# Patient Record
Sex: Male | Born: 1975 | Race: White | Hispanic: No | Marital: Single | Smoking: Never smoker
Health system: Southern US, Community
[De-identification: ages and names within clinical notes are randomized; demographics above are authoritative.]

## PROBLEM LIST (undated history)

## (undated) ENCOUNTER — Emergency Department (HOSPITAL_COMMUNITY): Discharge status: Against Medical Advice | Payer: BC Managed Care – PPO

## (undated) ENCOUNTER — Emergency Department (HOSPITAL_COMMUNITY): Admission: EM | Payer: Self-pay | Source: Home / Self Care

## (undated) DIAGNOSIS — K5000 Crohn's disease of small intestine without complications: Secondary | ICD-10-CM

## (undated) DIAGNOSIS — I10 Essential (primary) hypertension: Secondary | ICD-10-CM

## (undated) DIAGNOSIS — M109 Gout, unspecified: Secondary | ICD-10-CM

## (undated) DIAGNOSIS — K219 Gastro-esophageal reflux disease without esophagitis: Secondary | ICD-10-CM

## (undated) DIAGNOSIS — K449 Diaphragmatic hernia without obstruction or gangrene: Secondary | ICD-10-CM

## (undated) DIAGNOSIS — F1910 Other psychoactive substance abuse, uncomplicated: Secondary | ICD-10-CM

## (undated) DIAGNOSIS — I880 Nonspecific mesenteric lymphadenitis: Secondary | ICD-10-CM

## (undated) DIAGNOSIS — H548 Legal blindness, as defined in USA: Secondary | ICD-10-CM

## (undated) DIAGNOSIS — B1920 Unspecified viral hepatitis C without hepatic coma: Secondary | ICD-10-CM

## (undated) DIAGNOSIS — K260 Acute duodenal ulcer with hemorrhage: Secondary | ICD-10-CM

## (undated) DIAGNOSIS — K922 Gastrointestinal hemorrhage, unspecified: Secondary | ICD-10-CM

## (undated) HISTORY — DX: Legal blindness, as defined in USA: H54.8

## (undated) HISTORY — DX: Crohn's disease of small intestine without complications: K50.00

## (undated) HISTORY — DX: Diaphragmatic hernia without obstruction or gangrene: K44.9

## (undated) HISTORY — DX: Nonspecific mesenteric lymphadenitis: I88.0

## (undated) HISTORY — PX: LIVER BIOPSY: SHX301

## (undated) HISTORY — PX: EYE SURGERY: SHX253

## (undated) HISTORY — PX: FOOT SURGERY: SHX648

---

## 1998-08-31 ENCOUNTER — Emergency Department (HOSPITAL_COMMUNITY): Admission: EM | Admit: 1998-08-31 | Discharge: 1998-08-31 | Payer: Self-pay | Admitting: Emergency Medicine

## 1999-11-05 ENCOUNTER — Emergency Department (HOSPITAL_COMMUNITY): Admission: EM | Admit: 1999-11-05 | Discharge: 1999-11-05 | Payer: Self-pay | Admitting: Emergency Medicine

## 1999-11-05 ENCOUNTER — Encounter: Payer: Self-pay | Admitting: Emergency Medicine

## 1999-12-12 ENCOUNTER — Emergency Department (HOSPITAL_COMMUNITY): Admission: EM | Admit: 1999-12-12 | Discharge: 1999-12-12 | Payer: Self-pay | Admitting: Emergency Medicine

## 2001-01-19 ENCOUNTER — Emergency Department (HOSPITAL_COMMUNITY): Admission: EM | Admit: 2001-01-19 | Discharge: 2001-01-19 | Payer: Self-pay | Admitting: Emergency Medicine

## 2001-07-20 ENCOUNTER — Ambulatory Visit (HOSPITAL_COMMUNITY): Admission: RE | Admit: 2001-07-20 | Discharge: 2001-07-20 | Payer: Self-pay | Admitting: Gastroenterology

## 2001-07-20 ENCOUNTER — Encounter: Payer: Self-pay | Admitting: Gastroenterology

## 2001-09-01 ENCOUNTER — Encounter (INDEPENDENT_AMBULATORY_CARE_PROVIDER_SITE_OTHER): Payer: Self-pay | Admitting: Specialist

## 2001-09-01 ENCOUNTER — Ambulatory Visit (HOSPITAL_COMMUNITY): Admission: RE | Admit: 2001-09-01 | Discharge: 2001-09-01 | Payer: Self-pay | Admitting: Gastroenterology

## 2001-09-10 ENCOUNTER — Emergency Department (HOSPITAL_COMMUNITY): Admission: EM | Admit: 2001-09-10 | Discharge: 2001-09-10 | Payer: Self-pay | Admitting: Emergency Medicine

## 2001-09-13 ENCOUNTER — Emergency Department (HOSPITAL_COMMUNITY): Admission: EM | Admit: 2001-09-13 | Discharge: 2001-09-13 | Payer: Self-pay | Admitting: Emergency Medicine

## 2001-09-30 ENCOUNTER — Emergency Department (HOSPITAL_COMMUNITY): Admission: EM | Admit: 2001-09-30 | Discharge: 2001-09-30 | Payer: Self-pay

## 2001-11-08 ENCOUNTER — Encounter: Payer: Self-pay | Admitting: Emergency Medicine

## 2001-11-08 ENCOUNTER — Emergency Department (HOSPITAL_COMMUNITY): Admission: EM | Admit: 2001-11-08 | Discharge: 2001-11-08 | Payer: Self-pay | Admitting: Emergency Medicine

## 2002-01-08 ENCOUNTER — Emergency Department (HOSPITAL_COMMUNITY): Admission: EM | Admit: 2002-01-08 | Discharge: 2002-01-08 | Payer: Self-pay | Admitting: Emergency Medicine

## 2002-10-14 DIAGNOSIS — H548 Legal blindness, as defined in USA: Secondary | ICD-10-CM

## 2002-10-14 HISTORY — DX: Legal blindness, as defined in USA: H54.8

## 2002-11-03 ENCOUNTER — Emergency Department (HOSPITAL_COMMUNITY): Admission: EM | Admit: 2002-11-03 | Discharge: 2002-11-03 | Payer: Self-pay | Admitting: Emergency Medicine

## 2003-03-02 ENCOUNTER — Emergency Department (HOSPITAL_COMMUNITY): Admission: EM | Admit: 2003-03-02 | Discharge: 2003-03-02 | Payer: Self-pay | Admitting: Emergency Medicine

## 2004-11-12 ENCOUNTER — Emergency Department (HOSPITAL_COMMUNITY): Admission: EM | Admit: 2004-11-12 | Discharge: 2004-11-12 | Payer: Self-pay | Admitting: Emergency Medicine

## 2005-03-30 ENCOUNTER — Emergency Department (HOSPITAL_COMMUNITY): Admission: EM | Admit: 2005-03-30 | Discharge: 2005-03-30 | Payer: Self-pay | Admitting: Emergency Medicine

## 2006-03-22 ENCOUNTER — Emergency Department (HOSPITAL_COMMUNITY): Admission: EM | Admit: 2006-03-22 | Discharge: 2006-03-22 | Payer: Self-pay | Admitting: Emergency Medicine

## 2006-08-29 ENCOUNTER — Emergency Department (HOSPITAL_COMMUNITY): Admission: EM | Admit: 2006-08-29 | Discharge: 2006-08-29 | Payer: Self-pay | Admitting: Emergency Medicine

## 2006-11-23 ENCOUNTER — Encounter: Payer: Self-pay | Admitting: Emergency Medicine

## 2006-11-24 ENCOUNTER — Inpatient Hospital Stay (HOSPITAL_COMMUNITY): Admission: EM | Admit: 2006-11-24 | Discharge: 2006-11-29 | Payer: Self-pay | Admitting: Emergency Medicine

## 2006-11-25 ENCOUNTER — Encounter (INDEPENDENT_AMBULATORY_CARE_PROVIDER_SITE_OTHER): Payer: Self-pay | Admitting: *Deleted

## 2007-02-06 ENCOUNTER — Emergency Department (HOSPITAL_COMMUNITY): Admission: EM | Admit: 2007-02-06 | Discharge: 2007-02-06 | Payer: Self-pay | Admitting: Emergency Medicine

## 2007-06-19 ENCOUNTER — Emergency Department (HOSPITAL_COMMUNITY): Admission: EM | Admit: 2007-06-19 | Discharge: 2007-06-19 | Payer: Self-pay | Admitting: Emergency Medicine

## 2007-12-26 ENCOUNTER — Emergency Department (HOSPITAL_COMMUNITY): Admission: EM | Admit: 2007-12-26 | Discharge: 2007-12-26 | Payer: Self-pay | Admitting: Emergency Medicine

## 2008-07-25 ENCOUNTER — Emergency Department (HOSPITAL_COMMUNITY): Admission: EM | Admit: 2008-07-25 | Discharge: 2008-07-25 | Payer: Self-pay | Admitting: Emergency Medicine

## 2008-08-24 ENCOUNTER — Emergency Department (HOSPITAL_COMMUNITY): Admission: EM | Admit: 2008-08-24 | Discharge: 2008-08-24 | Payer: Self-pay | Admitting: Emergency Medicine

## 2009-02-16 ENCOUNTER — Emergency Department (HOSPITAL_COMMUNITY): Admission: EM | Admit: 2009-02-16 | Discharge: 2009-02-16 | Payer: Self-pay | Admitting: Emergency Medicine

## 2009-05-26 ENCOUNTER — Emergency Department (HOSPITAL_COMMUNITY): Admission: EM | Admit: 2009-05-26 | Discharge: 2009-05-26 | Payer: Self-pay | Admitting: Emergency Medicine
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
## 2011-03-01 NOTE — Op Note (Signed)
NAMEDEADRIAN, TOYA NO.:  [id number]   MEDICAL RECORD NO.:  [id number]          PATIENT TYPE:  INP   LOCATION:  5731                         FACILITY:  [facility]   PHYSICIAN:  Chalmers Guest, M.D.     DATE OF BIRTH:  [birth year]   DATE OF PROCEDURE:  11/24/2006  DATE OF DISCHARGE:                               OPERATIVE REPORT   PREOPERATIVE DIAGNOSIS:  1. Ocular hypotony, right eye.  2. Possible ruptured globe, right eye.  3. Orbital fracture, right eye.   POSTOPERATIVE DIAGNOSIS:  1. Ocular hypotony.  2. Orbital fracture.  3. Dehiscence of previous glaucoma surgery.   ANESTHESIA:  General.   PROCEDURE:  The patient was taken to the operating room where, following  induction of general anesthesia, the patient's face was prepped and  draped, taking care not to apply any pressure to the eye.  Following  this a speculum was gently inserted and with the tissues parted it was  noted that the patient had superior chemosis and subconjunctival  hemorrhage.  A 6-0 nylon suture was passed through clear cornea at the 6  o'clock position to infraduct the eye.   Following this a blunt Westcott scissors and Bishop-Harman forceps were  used to perform a fornix-based conjunctival flap.  It was noted that the  superior conjunctiva had an area at the 1 o'clock position of necrotic  conjunctiva.  Dissection was carefully carried forward.  There was  significant subconjunctival bleeding that was controlled with cautery.  The conjunctiva was carefully laid posterior.  A measurement was carried  out 5 mm to the corneoscleral wound where it was noted that there was a  surgical defect in the sclera with previously placed sutures.  After a  superior 180-degree peritomy was performed and cautery was applied, the  surface of the globe was inspected.  There was noted to be no foreign  body.  It was noted that there was an area of scarred tenon's tissue and  a site of a previous  trabeculectomy was identified that was now opened  with prolapse of uveal tissue.  A Weck-cel sponge was applied.  The  tissue was excised and sent to pathology.  This was the area of previous  sclerostomy with vitreous herniation.  A Super Sharp blade was used  through clear cornea at the 7 o'clock position and BSS was used to  inflate the anterior chamber.   Following this additional BSS was applied and then it was noted that  with the vitreous incarcerated in the wound it was necessary to place a  chamber maintainer attached to balanced salt solution and an anterior  vitrectomy was performed.  The patient's lens appeared to be opacified  at the beginning of the procedure with a fine film of blood overlying  the lens.  As the anterior vitrectomy was performed through the scleral  defect at the 1 o'clock position, care was taken not to touch the lens  tissue.  After vitreous had been removed from the wound site and there  was no prolapse of tissue, it was possible to see that  there was a  superior sector iridectomy.  All iris and uvula tissue were removed from  the wound and then it was noted that there was an approximately 1 x 3  scleral defect.  A 4-0 Mersilene suture on a tapered needle was then  passed in a mattress fashion through this tissue.  The suture was  secured, pulling the eye closed and tying it tightly.  Fluoroscein was  applied.  No leakage was noted.  The anterior chamber was again deepened  with balanced salt solution and a small amount of Provisc.   Following this a Tutoplast patch graft was then secured to the sclera  over the area of the defect using three interrupted 9-0 nylon sutures.  Following this the conjunctiva was then carefully re-sutured to the  corneoscleral limbus using a 7-0 Vicryl suture.  After this area had  been closed, BSS was again injected in to inflate the chamber.  Topical  fluorescein was applied and it was noted that there was leakage  through  the clear cornea paracentesis site.  Therefore a single 10-0 nylon  suture was passed through the paracentesis to achieve watertight  closure.  The anterior chamber was again inflated by passing it just  beside the previously placed suture.  The chamber remained deep and  there was no fluorescein drips noted.  Therefore sterile conjunctival  injection of gentamicin was given inferiorly.  Topical Tobradex ointment  was applied.  A patch and Fox shield were placed and the patient  returned to recovery area in stable condition.      Chalmers Guest, M.D.  Electronically Signed     RW/MEDQ  D:  11/25/2006  T:  11/25/2006  Job:  [id number]
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
## 2011-03-23 ENCOUNTER — Emergency Department (HOSPITAL_COMMUNITY): Payer: Self-pay

## 2011-03-23 ENCOUNTER — Emergency Department (HOSPITAL_COMMUNITY)
Admission: EM | Admit: 2011-03-23 | Discharge: 2011-03-23 | Disposition: A | Discharge status: Home / Self Care | Payer: Self-pay | Attending: Emergency Medicine | Admitting: Emergency Medicine

## 2011-03-23 ENCOUNTER — Emergency Department (HOSPITAL_COMMUNITY)
Admission: EM | Admit: 2011-03-23 | Discharge: 2011-03-23 | Discharge status: Against Medical Advice | Payer: Self-pay | Attending: Emergency Medicine | Admitting: Emergency Medicine

## 2011-03-23 DIAGNOSIS — R0602 Shortness of breath: Secondary | ICD-10-CM | POA: Insufficient documentation

## 2011-03-23 DIAGNOSIS — IMO0001 Reserved for inherently not codable concepts without codable children: Secondary | ICD-10-CM | POA: Insufficient documentation

## 2011-03-23 DIAGNOSIS — Z8619 Personal history of other infectious and parasitic diseases: Secondary | ICD-10-CM | POA: Insufficient documentation

## 2011-03-23 DIAGNOSIS — R000 Tachycardia, unspecified: Secondary | ICD-10-CM | POA: Insufficient documentation

## 2011-03-23 DIAGNOSIS — R748 Abnormal levels of other serum enzymes: Secondary | ICD-10-CM | POA: Insufficient documentation

## 2011-03-23 LAB — RAPID URINE DRUG SCREEN, HOSP PERFORMED
Opiates: NOT DETECTED
Tetrahydrocannabinol: NOT DETECTED

## 2011-03-23 LAB — DIFFERENTIAL
Basophils Absolute: 0 10*3/uL (ref 0.0–0.1)
Basophils Relative: 0 % (ref 0–1)
Eosinophils Absolute: 0.1 10*3/uL (ref 0.0–0.7)
Eosinophils Relative: 1 % (ref 0–5)
Lymphocytes Relative: 10 % — ABNORMAL LOW (ref 12–46)
Lymphs Abs: 1.1 10*3/uL (ref 0.7–4.0)
Monocytes Absolute: 0.9 10*3/uL (ref 0.1–1.0)
Monocytes Relative: 8 % (ref 3–12)
Neutro Abs: 9.7 10*3/uL — ABNORMAL HIGH (ref 1.7–7.7)
Neutrophils Relative %: 82 % — ABNORMAL HIGH (ref 43–77)

## 2011-03-23 LAB — URINALYSIS, ROUTINE W REFLEX MICROSCOPIC
Glucose, UA: NEGATIVE mg/dL
Hgb urine dipstick: NEGATIVE
Leukocytes, UA: NEGATIVE
Specific Gravity, Urine: 1.023 (ref 1.005–1.030)
Urobilinogen, UA: 0.2 mg/dL (ref 0.0–1.0)

## 2011-03-23 LAB — HEPATIC FUNCTION PANEL
ALT: 229 U/L — ABNORMAL HIGH (ref 0–53)
AST: 183 U/L — ABNORMAL HIGH (ref 0–37)
Albumin: 4.4 g/dL (ref 3.5–5.2)
Alkaline Phosphatase: 53 U/L (ref 39–117)
Bilirubin, Direct: 0.1 mg/dL (ref 0.0–0.3)
Indirect Bilirubin: 0.3 mg/dL (ref 0.3–0.9)
Total Bilirubin: 0.4 mg/dL (ref 0.3–1.2)
Total Protein: 8.5 g/dL — ABNORMAL HIGH (ref 6.0–8.3)

## 2011-03-23 LAB — CK
Total CK: 458 U/L — ABNORMAL HIGH (ref 7–232)
Total CK: 202 U/L (ref 7–232)

## 2011-03-23 LAB — CBC
HCT: 40.6 % (ref 39.0–52.0)
Hemoglobin: 13.8 g/dL (ref 13.0–17.0)
MCH: 31.7 pg (ref 26.0–34.0)
MCHC: 34 g/dL (ref 30.0–36.0)
MCV: 93.3 fL (ref 78.0–100.0)
Platelets: 234 10*3/uL (ref 150–400)
RBC: 4.35 MIL/uL (ref 4.22–5.81)
RDW: 13.9 % (ref 11.5–15.5)
WBC: 11.9 10*3/uL — ABNORMAL HIGH (ref 4.0–10.5)

## 2011-03-23 LAB — TROPONIN I: Troponin I: <0.3 ng/mL (ref <0.30)

## 2011-03-24 LAB — COMPREHENSIVE METABOLIC PANEL
BUN: 11 mg/dL (ref 6–23)
CO2: 26 mEq/L (ref 19–32)
Chloride: 101 mEq/L (ref 96–112)
Creatinine, Ser: 1 mg/dL (ref 0.4–1.5)
GFR calc non Af Amer: >60 mL/min (ref >60)
Total Bilirubin: 0.6 mg/dL (ref 0.3–1.2)

## 2011-03-25 LAB — URINE CULTURE: Culture: NO GROWTH

## 2011-06-04 ENCOUNTER — Emergency Department (HOSPITAL_COMMUNITY): Payer: Self-pay

## 2011-06-04 ENCOUNTER — Emergency Department (HOSPITAL_COMMUNITY)
Admission: EM | Admit: 2011-06-04 | Discharge: 2011-06-05 | Disposition: A | Discharge status: Home / Self Care | Payer: Self-pay

## 2011-06-04 DIAGNOSIS — R4182 Altered mental status, unspecified: Secondary | ICD-10-CM | POA: Insufficient documentation

## 2011-06-04 DIAGNOSIS — R350 Frequency of micturition: Secondary | ICD-10-CM | POA: Insufficient documentation

## 2011-06-04 DIAGNOSIS — S1093XA Contusion of unspecified part of neck, initial encounter: Secondary | ICD-10-CM | POA: Insufficient documentation

## 2011-06-04 DIAGNOSIS — S1190XA Unspecified open wound of unspecified part of neck, initial encounter: Secondary | ICD-10-CM | POA: Insufficient documentation

## 2011-06-04 DIAGNOSIS — F1010 Alcohol abuse, uncomplicated: Secondary | ICD-10-CM | POA: Insufficient documentation

## 2011-06-04 DIAGNOSIS — F411 Generalized anxiety disorder: Secondary | ICD-10-CM | POA: Insufficient documentation

## 2011-06-04 DIAGNOSIS — S0003XA Contusion of scalp, initial encounter: Secondary | ICD-10-CM | POA: Insufficient documentation

## 2011-06-04 DIAGNOSIS — R112 Nausea with vomiting, unspecified: Secondary | ICD-10-CM | POA: Insufficient documentation

## 2011-06-04 DIAGNOSIS — IMO0002 Reserved for concepts with insufficient information to code with codable children: Secondary | ICD-10-CM | POA: Insufficient documentation

## 2011-06-04 LAB — TYPE AND SCREEN

## 2011-06-04 MED ORDER — IOHEXOL 350 MG/ML SOLN
100.0000 mL | Freq: Once | INTRAVENOUS | Status: AC | PRN
  Administered 2011-06-04: 100 mL via INTRAVENOUS

## 2011-06-05 ENCOUNTER — Emergency Department (HOSPITAL_COMMUNITY)
Admission: EM | Admit: 2011-06-05 | Discharge: 2011-06-05 | Disposition: A | Discharge status: Home / Self Care | Payer: Self-pay | Attending: Emergency Medicine | Admitting: Emergency Medicine

## 2011-06-05 ENCOUNTER — Emergency Department (HOSPITAL_COMMUNITY)
Admission: EM | Admit: 2011-06-05 | Discharge: 2011-06-06 | Disposition: A | Discharge status: Home / Self Care | Payer: Self-pay | Attending: Emergency Medicine | Admitting: Emergency Medicine

## 2011-06-05 ENCOUNTER — Emergency Department (HOSPITAL_COMMUNITY): Payer: Self-pay

## 2011-06-05 DIAGNOSIS — H179 Unspecified corneal scar and opacity: Secondary | ICD-10-CM | POA: Insufficient documentation

## 2011-06-05 DIAGNOSIS — Z8639 Personal history of other endocrine, nutritional and metabolic disease: Secondary | ICD-10-CM | POA: Insufficient documentation

## 2011-06-05 DIAGNOSIS — R112 Nausea with vomiting, unspecified: Secondary | ICD-10-CM | POA: Insufficient documentation

## 2011-06-05 DIAGNOSIS — S1190XA Unspecified open wound of unspecified part of neck, initial encounter: Secondary | ICD-10-CM | POA: Insufficient documentation

## 2011-06-05 DIAGNOSIS — R0602 Shortness of breath: Secondary | ICD-10-CM | POA: Insufficient documentation

## 2011-06-05 DIAGNOSIS — J189 Pneumonia, unspecified organism: Secondary | ICD-10-CM | POA: Insufficient documentation

## 2011-06-05 DIAGNOSIS — Z862 Personal history of diseases of the blood and blood-forming organs and certain disorders involving the immune mechanism: Secondary | ICD-10-CM | POA: Insufficient documentation

## 2011-06-05 DIAGNOSIS — Z87828 Personal history of other (healed) physical injury and trauma: Secondary | ICD-10-CM | POA: Insufficient documentation

## 2011-06-05 DIAGNOSIS — R002 Palpitations: Secondary | ICD-10-CM | POA: Insufficient documentation

## 2011-06-05 DIAGNOSIS — M542 Cervicalgia: Secondary | ICD-10-CM | POA: Insufficient documentation

## 2011-06-05 DIAGNOSIS — E860 Dehydration: Secondary | ICD-10-CM | POA: Insufficient documentation

## 2011-06-05 DIAGNOSIS — X58XXXA Exposure to other specified factors, initial encounter: Secondary | ICD-10-CM | POA: Insufficient documentation

## 2011-06-05 DIAGNOSIS — IMO0002 Reserved for concepts with insufficient information to code with codable children: Secondary | ICD-10-CM | POA: Insufficient documentation

## 2011-06-05 DIAGNOSIS — F411 Generalized anxiety disorder: Secondary | ICD-10-CM | POA: Insufficient documentation

## 2011-06-05 LAB — CBC
HCT: 42.1 % (ref 39.0–52.0)
HCT: 42.8 % (ref 39.0–52.0)
Hemoglobin: 14.4 g/dL (ref 13.0–17.0)
Hemoglobin: 14.5 g/dL (ref 13.0–17.0)
MCH: 31.3 pg (ref 26.0–34.0)
MCV: 92.4 fL (ref 78.0–100.0)
RBC: 4.52 MIL/uL (ref 4.22–5.81)
RBC: 4.63 MIL/uL (ref 4.22–5.81)
WBC: 10.1 10*3/uL (ref 4.0–10.5)
WBC: 10.8 10*3/uL — ABNORMAL HIGH (ref 4.0–10.5)

## 2011-06-05 LAB — BASIC METABOLIC PANEL
BUN: 9 mg/dL (ref 6–23)
CO2: 26 mEq/L (ref 19–32)
Chloride: 107 mEq/L (ref 96–112)
Glucose, Bld: 107 mg/dL — ABNORMAL HIGH (ref 70–99)
Potassium: 3.9 mEq/L (ref 3.5–5.1)

## 2011-06-05 LAB — DIFFERENTIAL
Basophils Absolute: 0 10*3/uL (ref 0.0–0.1)
Eosinophils Absolute: 0.1 10*3/uL (ref 0.0–0.7)
Lymphocytes Relative: 18 % (ref 12–46)
Lymphocytes Relative: 17 % (ref 12–46)
Lymphs Abs: 1.8 10*3/uL (ref 0.7–4.0)
Monocytes Absolute: 0.6 10*3/uL (ref 0.1–1.0)
Monocytes Relative: 7 % (ref 3–12)
Neutro Abs: 7.7 10*3/uL (ref 1.7–7.7)
Neutro Abs: 8.1 10*3/uL — ABNORMAL HIGH (ref 1.7–7.7)
Neutrophils Relative %: 75 % (ref 43–77)

## 2011-06-05 LAB — COMPREHENSIVE METABOLIC PANEL
BUN: 13 mg/dL (ref 6–23)
CO2: 26 mEq/L (ref 19–32)
Calcium: 10 mg/dL (ref 8.4–10.5)
Chloride: 101 mEq/L (ref 96–112)
Creatinine, Ser: 1.08 mg/dL (ref 0.50–1.35)
GFR calc Af Amer: >60 mL/min (ref >60)
GFR calc non Af Amer: >60 mL/min (ref >60)
Glucose, Bld: 111 mg/dL — ABNORMAL HIGH (ref 70–99)
Total Bilirubin: 0.9 mg/dL (ref 0.3–1.2)

## 2011-06-10 ENCOUNTER — Telehealth (INDEPENDENT_AMBULATORY_CARE_PROVIDER_SITE_OTHER): Payer: Self-pay | Admitting: Orthopedic Surgery

## 2011-06-10 MED ORDER — HYDROCODONE-ACETAMINOPHEN 5-325 MG PO TABS: 1.0000 | ORAL_TABLET | ORAL | Status: AC | PRN

## 2011-06-10 NOTE — Telephone Encounter (Signed)
Scheduled appt on Thursday for suture removal and called in Norco to replace Percocet that is nearly out.
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
## 2011-06-13 ENCOUNTER — Encounter (INDEPENDENT_AMBULATORY_CARE_PROVIDER_SITE_OTHER): Payer: Self-pay | Admitting: Physician Assistant

## 2011-06-13 ENCOUNTER — Ambulatory Visit (INDEPENDENT_AMBULATORY_CARE_PROVIDER_SITE_OTHER): Payer: Self-pay | Admitting: Physician Assistant

## 2011-06-13 VITALS — BP 140/100 | HR 80 | Temp 98.2°F | Ht 70.5 in | Wt 212.4 lb

## 2011-06-13 DIAGNOSIS — S1191XA Laceration without foreign body of unspecified part of neck, initial encounter: Secondary | ICD-10-CM

## 2011-06-13 DIAGNOSIS — S1190XA Unspecified open wound of unspecified part of neck, initial encounter: Secondary | ICD-10-CM

## 2011-06-13 NOTE — Patient Instructions (Signed)
You may start using Vitamin E Cream on wound in about 1 more week. Keep sunscreen on wound to help lessen the appearance of the scar. Follow up with Trauma Service as needed
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
## 2011-07-08 LAB — COMPREHENSIVE METABOLIC PANEL
ALT: 171 — ABNORMAL HIGH
AST: 79 — ABNORMAL HIGH
Albumin: 4
CO2: 28
Calcium: 9.3
Chloride: 105
Creatinine, Ser: 1.06
GFR calc Af Amer: >60
GFR calc non Af Amer: >60
Sodium: 141
Total Bilirubin: 0.5

## 2011-07-08 LAB — DIFFERENTIAL
Eosinophils Absolute: 0.1
Eosinophils Relative: 1
Lymphocytes Relative: 41
Lymphs Abs: 2.9
Monocytes Absolute: 0.4

## 2011-07-08 LAB — CBC
MCV: 92.3
Platelets: 261
RBC: 4.28
WBC: 7.1

## 2011-07-08 LAB — PROTIME-INR: Prothrombin Time: 12.6

## 2011-07-08 LAB — APTT: aPTT: 25

## 2011-07-16 LAB — COMPREHENSIVE METABOLIC PANEL
ALT: 67 — ABNORMAL HIGH
AST: 29
Albumin: 3.8
Alkaline Phosphatase: 41
GFR calc Af Amer: >60
Glucose, Bld: 105 — ABNORMAL HIGH
Potassium: 3.7
Sodium: 138
Total Protein: 7.1

## 2011-07-16 LAB — DIFFERENTIAL
Basophils Relative: 1
Eosinophils Absolute: 0
Eosinophils Relative: 1
Monocytes Absolute: 0.4
Monocytes Relative: 7

## 2011-07-16 LAB — CBC
Hemoglobin: 13.9
RDW: 14.1

## 2012-12-04 ENCOUNTER — Encounter (HOSPITAL_COMMUNITY): Payer: Self-pay | Admitting: Emergency Medicine

## 2012-12-04 ENCOUNTER — Emergency Department (HOSPITAL_COMMUNITY): Payer: Self-pay

## 2012-12-04 ENCOUNTER — Emergency Department (HOSPITAL_COMMUNITY)
Admission: EM | Admit: 2012-12-04 | Discharge: 2012-12-05 | Disposition: A | Discharge status: Home / Self Care | Payer: Self-pay | Attending: Emergency Medicine | Admitting: Emergency Medicine

## 2012-12-04 DIAGNOSIS — R509 Fever, unspecified: Secondary | ICD-10-CM | POA: Insufficient documentation

## 2012-12-04 DIAGNOSIS — IMO0001 Reserved for inherently not codable concepts without codable children: Secondary | ICD-10-CM | POA: Insufficient documentation

## 2012-12-04 DIAGNOSIS — B1920 Unspecified viral hepatitis C without hepatic coma: Secondary | ICD-10-CM | POA: Insufficient documentation

## 2012-12-04 DIAGNOSIS — I10 Essential (primary) hypertension: Secondary | ICD-10-CM | POA: Insufficient documentation

## 2012-12-04 DIAGNOSIS — A084 Viral intestinal infection, unspecified: Secondary | ICD-10-CM

## 2012-12-04 DIAGNOSIS — Z79899 Other long term (current) drug therapy: Secondary | ICD-10-CM | POA: Insufficient documentation

## 2012-12-04 DIAGNOSIS — R079 Chest pain, unspecified: Secondary | ICD-10-CM | POA: Insufficient documentation

## 2012-12-04 DIAGNOSIS — M549 Dorsalgia, unspecified: Secondary | ICD-10-CM | POA: Insufficient documentation

## 2012-12-04 DIAGNOSIS — A088 Other specified intestinal infections: Secondary | ICD-10-CM | POA: Insufficient documentation

## 2012-12-04 DIAGNOSIS — R197 Diarrhea, unspecified: Secondary | ICD-10-CM | POA: Insufficient documentation

## 2012-12-04 DIAGNOSIS — Z8619 Personal history of other infectious and parasitic diseases: Secondary | ICD-10-CM | POA: Insufficient documentation

## 2012-12-04 HISTORY — DX: Unspecified viral hepatitis C without hepatic coma: B19.20

## 2012-12-04 HISTORY — DX: Essential (primary) hypertension: I10

## 2012-12-04 LAB — CBC WITH DIFFERENTIAL/PLATELET
Basophils Relative: 0 % (ref 0–1)
Eosinophils Absolute: 0.1 10*3/uL (ref 0.0–0.7)
Eosinophils Relative: 1 % (ref 0–5)
MCH: 31.4 pg (ref 26.0–34.0)
MCHC: 34.6 g/dL (ref 30.0–36.0)
MCV: 91 fL (ref 78.0–100.0)
Neutrophils Relative %: 71 % (ref 43–77)
Platelets: 198 10*3/uL (ref 150–400)

## 2012-12-04 LAB — COMPREHENSIVE METABOLIC PANEL
ALT: 96 U/L — ABNORMAL HIGH (ref 0–53)
Albumin: 3.7 g/dL (ref 3.5–5.2)
Alkaline Phosphatase: 49 U/L (ref 39–117)
BUN: 13 mg/dL (ref 6–23)
Calcium: 9.4 mg/dL (ref 8.4–10.5)
GFR calc Af Amer: >90 mL/min (ref >90)
Potassium: 3.5 mEq/L (ref 3.5–5.1)
Sodium: 144 mEq/L (ref 135–145)
Total Protein: 7.7 g/dL (ref 6.0–8.3)

## 2012-12-04 LAB — POCT I-STAT TROPONIN I: Troponin i, poc: 0 ng/mL (ref 0.00–0.08)

## 2012-12-04 MED ORDER — PROMETHAZINE HCL 25 MG PO TABS: 25.0000 mg | ORAL_TABLET | Freq: Four times a day (QID) | ORAL | Status: DC | PRN

## 2012-12-04 MED ORDER — PROMETHAZINE HCL 25 MG/ML IJ SOLN
25.0000 mg | Freq: Once | INTRAMUSCULAR | Status: AC
  Administered 2012-12-04: 25 mg via INTRAVENOUS
  Filled 2012-12-04: qty 1

## 2012-12-04 MED ORDER — SODIUM CHLORIDE 0.9 % IV SOLN
Freq: Once | INTRAVENOUS | Status: AC
  Administered 2012-12-04: 22:00:00 via INTRAVENOUS

## 2012-12-04 NOTE — ED Notes (Signed)
Pt c/o left sided chest pain/stabbing. Pt reports he was eating dinner when all of a sudden he started getting chest pain along with chills and nausea/dizzy/bilateral hand tingling then vomited X 1 then came to the ED. Pt reports yesterday he was nauseous then vomited and had diarrhea. Then went to work and continued to have diarrhea and vomited X 3 at work. Pt in nad, resp e/u, skin warm and dry. Pt sts he hasn't been taking his BP medication because he ran out and doesn't have insurance at the moment because he just started a new job. Pt goes to Dr. Kristopher Oppenheim.

## 2012-12-04 NOTE — ED Notes (Signed)
Patient reports that while eating in a restaurant tonight, he got a sudden onset of chest pain, shortness of breath, nausea, vomiting, and chills.  Patient states that he feels that his heart is skipping a beat.  Reports HTN; states that his dad passed away from an MI at 81.

## 2012-12-04 NOTE — ED Notes (Signed)
Reports past history of cocaine use; denies cocaine use in the last two months.

## 2012-12-04 NOTE — ED Notes (Signed)
Pt also c/o neck and back pain since yesterday. Pt sts he has been taking ibuprofen for it and then it goes away, he thinks it may be from work.

## 2012-12-04 NOTE — ED Provider Notes (Signed)
History     CSN: [id number]  Arrival date & time 12/04/12  2059   First MD Initiated Contact with Patient 12/04/12 2220      Chief Complaint  Patient presents with  . Chest Pain  . Emesis    (Consider location/radiation/quality/duration/timing/severity/associated sxs/prior treatment) HPI Comments: Pt is a 37 yo man who had had nausea, vomiting and diarrhea yesterday.  He was up at 3 AM, felt shaky, and had vomiting then. He took some Tylenol.  He went to work without problems, took a nap, and then he and his wife went out to dinner at [employer].  Afte the meal he was shaky, was nauseated, and had a pain in the chest. He therefore sought evaluation.   Patient is a 37 y.o. male presenting with vomiting.  Emesis Severity:  Severe Duration:  1 day Timing:  Intermittent Quality:  Stomach contents Progression:  Worsening Chronicity:  New Recent urination:  Normal Worsened by:  Nothing tried Ineffective treatments:  None tried Associated symptoms: chills, diarrhea, fever and myalgias   Diarrhea:    Quality:  Watery   Severity:  Moderate   Duration:  1 day   Timing:  Intermittent Fever:    Temp source:  Subjective   Progression:  Waxing and waning   Past Medical History  Diagnosis Date  . Hypertension   . Hepatitis C     Past Surgical History  Procedure Laterality Date  . Eye surgery    . Foot surgery      History reviewed. No pertinent family history.  History  Substance Use Topics  . Smoking status: Never Smoker   . Smokeless tobacco: Not on file  . Alcohol Use: Yes      Review of Systems  Constitutional: Positive for fever and chills.  HENT: Negative.   Eyes: Negative.   Respiratory: Negative.   Cardiovascular: Positive for chest pain.  Gastrointestinal: Positive for nausea, vomiting and diarrhea.  Genitourinary: Negative.   Musculoskeletal: Positive for myalgias and back pain.  Skin: Negative.   Neurological: Negative.    Psychiatric/Behavioral: Negative.     Allergies  Review of patient's allergies indicates no known allergies.  Home Medications   Current Outpatient Rx  Name  Route  Sig  Dispense  Refill  . lisinopril (PRINIVIL,ZESTRIL) 10 MG tablet   Oral   Take 10 mg by mouth 2 (two) times daily.         . metoprolol tartrate (LOPRESSOR) 25 MG tablet   Oral   Take 25 mg by mouth 2 (two) times daily.           BP 174/97  Pulse 103  Temp(Src) 98.1 F (36.7 C) (Oral)  Resp 13  SpO2 99%  Physical Exam  Nursing note and vitals reviewed. Constitutional: He is oriented to person, place, and time. He appears well-developed and well-nourished.  Appears anxious, no distress at rest.  HENT:  Head: Normocephalic and atraumatic.  Right Ear: External ear normal.  Left Ear: External ear normal.  Mouth/Throat: Oropharynx is clear and moist.  Eyes: Conjunctivae and EOM are normal. Pupils are equal, round, and reactive to light.  Neck: Normal range of motion. Neck supple.  Cardiovascular: Normal rate, regular rhythm and normal heart sounds.   Pulmonary/Chest: Effort normal and breath sounds normal.  Abdominal: Soft. Bowel sounds are normal.  Musculoskeletal: Normal range of motion. He exhibits no edema and no tenderness.  Neurological: He is alert and oriented to person, place, and time.  No  sensory or motor deficit.  Skin: Skin is warm and dry.  Psychiatric: He has a normal mood and affect. His behavior is normal.    ED Course  Procedures (including critical care time)     Date: 12/04/2012  Rate: 112  Rhythm: sinus tachycardia  QRS Axis: normal  Intervals: normal  ST/T Wave abnormalities: normal  Conduction Disutrbances:  Incomplete right bundle branch block.  Narrative Interpretation: Borderline EKG  Old EKG Reviewed: none available  Results for orders placed during the hospital encounter of 12/04/12  CBC WITH DIFFERENTIAL      Result Value Range   WBC 10.2  4.0 - 10.5 K/uL    RBC 4.42  4.22 - 5.81 MIL/uL   Hemoglobin 13.9  13.0 - 17.0 g/dL   HCT 16.1  09.6 - 04.5 %   MCV 91.0  78.0 - 100.0 fL   MCH 31.4  26.0 - 34.0 pg   MCHC 34.6  30.0 - 36.0 g/dL   RDW 40.9  81.1 - 91.4 %   Platelets 198  150 - 400 K/uL   Neutrophils Relative 71  43 - 77 %   Neutro Abs 7.2  1.7 - 7.7 K/uL   Lymphocytes Relative 22  12 - 46 %   Lymphs Abs 2.2  0.7 - 4.0 K/uL   Monocytes Relative 6  3 - 12 %   Monocytes Absolute 0.7  0.1 - 1.0 K/uL   Eosinophils Relative 1  0 - 5 %   Eosinophils Absolute 0.1  0.0 - 0.7 K/uL   Basophils Relative 0  0 - 1 %   Basophils Absolute 0.0  0.0 - 0.1 K/uL  COMPREHENSIVE METABOLIC PANEL      Result Value Range   Sodium 144  135 - 145 mEq/L   Potassium 3.5  3.5 - 5.1 mEq/L   Chloride 103  96 - 112 mEq/L   CO2 29  19 - 32 mEq/L   Glucose, Bld 111 (*) 70 - 99 mg/dL   BUN 13  6 - 23 mg/dL   Creatinine, Ser 7.82  0.50 - 1.35 mg/dL   Calcium 9.4  8.4 - 95.6 mg/dL   Total Protein 7.7  6.0 - 8.3 g/dL   Albumin 3.7  3.5 - 5.2 g/dL   AST 66 (*) 0 - 37 U/L   ALT 96 (*) 0 - 53 U/L   Alkaline Phosphatase 49  39 - 117 U/L   Total Bilirubin 0.3  0.3 - 1.2 mg/dL   GFR calc non Af Amer >90  >90 mL/min   GFR calc Af Amer >90  >90 mL/min  POCT I-STAT TROPONIN I      Result Value Range   Troponin i, poc 0.00  0.00 - 0.08 ng/mL   Comment 3            Dg Chest 2 View  12/04/2012  *RADIOLOGY REPORT*  Clinical Data: Chest pain and emesis.  Hyperventilating.  CHEST - 2 VIEW  Comparison: 06/05/2011  Findings: The heart size and pulmonary vascularity are normal. The lungs appear clear and expanded without focal air space disease or consolidation. No blunting of the costophrenic angles.  No pneumothorax.  Mediastinal contours appear intact.  Infiltrates seen previously in the right lower lung have resolved.  IMPRESSION: No evidence of active pulmonary disease.   Original Report Authenticated By: Burman Nieves, M.D.     Course in ED:  Pt was seen and had  physical examination.  He was treated with  IV fluids and IV Phenergan.  His lab workup was negative.  He was advised that he had viral gastroenteritis and was treated symptomatically with clear liquids and po Phenergan.  Released.   1. Viral gastroenteritis         Carleene Cooper III, MD 12/05/12 1229

## 2013-04-05 ENCOUNTER — Emergency Department (HOSPITAL_COMMUNITY)
Admission: EM | Admit: 2013-04-05 | Discharge: 2013-04-05 | Disposition: A | Discharge status: Home / Self Care | Payer: BC Managed Care – PPO | Attending: Emergency Medicine | Admitting: Emergency Medicine

## 2013-04-05 ENCOUNTER — Encounter (HOSPITAL_COMMUNITY): Payer: Self-pay | Admitting: *Deleted

## 2013-04-05 DIAGNOSIS — M109 Gout, unspecified: Secondary | ICD-10-CM

## 2013-04-05 DIAGNOSIS — Z79899 Other long term (current) drug therapy: Secondary | ICD-10-CM | POA: Insufficient documentation

## 2013-04-05 DIAGNOSIS — Z8619 Personal history of other infectious and parasitic diseases: Secondary | ICD-10-CM | POA: Insufficient documentation

## 2013-04-05 DIAGNOSIS — I10 Essential (primary) hypertension: Secondary | ICD-10-CM | POA: Insufficient documentation

## 2013-04-05 MED ORDER — HYDROCODONE-ACETAMINOPHEN 5-325 MG PO TABS: 1.0000 | ORAL_TABLET | Freq: Four times a day (QID) | ORAL | Status: DC | PRN

## 2013-04-05 MED ORDER — PREDNISONE 20 MG PO TABS: 40.0000 mg | ORAL_TABLET | Freq: Every day | ORAL | Status: DC

## 2013-04-05 MED ORDER — INDOMETHACIN 25 MG PO CAPS: 25.0000 mg | ORAL_CAPSULE | Freq: Three times a day (TID) | ORAL | Status: DC | PRN

## 2013-04-05 NOTE — ED Provider Notes (Signed)
History    This chart was scribed for non-physician practitioner, Lucretia Kern, working with Derwood Kaplan, MD by Donne Anon, ED Scribe. This patient was seen in room WTR8/WTR8 and the patient's care was started at 2148.  CSN: [id number] Arrival date & time 04/05/13  1950  First MD Initiated Contact with Patient 04/05/13 2148     Chief Complaint  Patient presents with  . Gout    The history is provided by the patient. No language interpreter was used.   HPI Comments: Gabriel Garcia is a 37 y.o. 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 Walking makes the pain worse. His last flare up was "a while ago." He denies fever, chills, recent injury or any other pain.   Past Medical History  Diagnosis Date  . Hypertension   . Hepatitis C    Past Surgical History  Procedure Laterality Date  . Eye surgery    . Foot surgery     No family history on file. History  Substance Use Topics  . Smoking status: Never Smoker   . Smokeless tobacco: Not on file  . Alcohol Use: Yes    Review of Systems  Constitutional: Negative for fever and chills.  Musculoskeletal: Positive for arthralgias.  All other systems reviewed and are negative.    Allergies  Review of patient's allergies indicates no known allergies.  Home Medications   Current Outpatient Rx  Name  Route  Sig  Dispense  Refill  . lisinopril (PRINIVIL,ZESTRIL) 10 MG tablet   Oral   Take 10 mg by mouth 2 (two) times daily.         . metoprolol tartrate (LOPRESSOR) 25 MG tablet   Oral   Take 25 mg by mouth 2 (two) times daily.         . promethazine (PHENERGAN) 25 MG tablet   Oral   Take 1 tablet (25 mg total) by mouth every 6 (six) hours as needed for nausea.   12 tablet   0    BP 128/81  Pulse 73  Temp(Src) 98.3 F (36.8 C)  Resp 20  SpO2 98%  Physical Exam  Nursing note and vitals  reviewed. Constitutional: He appears well-developed and well-nourished. No distress.  HENT:  Head: Normocephalic and atraumatic.  Eyes: Conjunctivae are normal.  Neck: Neck supple. No tracheal deviation present.  Cardiovascular: Normal rate.   Pulmonary/Chest: Effort normal. No respiratory distress.  Musculoskeletal: Normal range of motion. He exhibits tenderness.  Swelling to the right MTP joint of the right great toe. Tender to palpation over the joint. Pain with ROM at the joint. No tenderness over distal toe. Normal rest of foot.   Neurological: He is alert.  Skin: Skin is warm and dry.  Psychiatric: He has a normal mood and affect. His behavior is normal.    ED Course  Procedures (including critical care time) DIAGNOSTIC STUDIES: Oxygen Saturation is 98% on RA, normal by my interpretation.    COORDINATION OF CARE: 10:36 PM Discussed treatment plan which includes medication with pt at bedside and pt agreed to plan.   1. Gout of big toe     MDM  Pt with right great toe pain, swelling. Hx of gout. Feels the same. Afebrile. Non toxic. Doubt infection. No injury. No systemic symptoms. Will try indocin, prednisone, norco for pain. Follow up with his doctor.  Filed Vitals:  04/05/13 2100  BP: 128/81  Pulse: 73  Temp: 98.3 F (36.8 C)  Resp: 20  SpO2: 98%    I personally performed the services described in this documentation, which was scribed in my presence. The recorded information has been reviewed and is accurate. Lottie Mussel, PA-C 04/06/13 4098

## 2013-04-05 NOTE — ED Notes (Signed)
Pt c/o gout right foot big toe; states previous history; does not have meds

## 2013-04-05 NOTE — ED Notes (Signed)
Patient is alert and oriented x3.  He is complaining of right great toe pain. Patient states that he has a history of gout.  Currently he rates his pain 10 of 10

## 2013-04-05 NOTE — ED Notes (Signed)
Patient is alert and oriented x3.  He was given DC instructions and follow up visit instructions.  Patient gave verbal understanding.  He was DC ambulatory under his own power to home.  V/S stable.  He was not showing any signs of distress on DC 

## 2013-04-06 NOTE — ED Provider Notes (Signed)
Medical screening examination/treatment/procedure(s) were performed by non-physician practitioner and as supervising physician I was immediately available for consultation/collaboration.  Derwood Kaplan, MD 04/06/13 1441

## 2013-06-04 ENCOUNTER — Emergency Department (HOSPITAL_COMMUNITY)
Admission: EM | Admit: 2013-06-04 | Discharge: 2013-06-04 | Disposition: A | Discharge status: Home / Self Care | Payer: BC Managed Care – PPO | Attending: Emergency Medicine | Admitting: Emergency Medicine

## 2013-06-04 ENCOUNTER — Encounter (HOSPITAL_COMMUNITY): Payer: Self-pay

## 2013-06-04 DIAGNOSIS — I10 Essential (primary) hypertension: Secondary | ICD-10-CM | POA: Insufficient documentation

## 2013-06-04 DIAGNOSIS — T148XXA Other injury of unspecified body region, initial encounter: Secondary | ICD-10-CM

## 2013-06-04 DIAGNOSIS — S2341XA Sprain of ribs, initial encounter: Secondary | ICD-10-CM | POA: Insufficient documentation

## 2013-06-04 DIAGNOSIS — Y9389 Activity, other specified: Secondary | ICD-10-CM | POA: Insufficient documentation

## 2013-06-04 DIAGNOSIS — Z79899 Other long term (current) drug therapy: Secondary | ICD-10-CM | POA: Insufficient documentation

## 2013-06-04 DIAGNOSIS — X503XXA Overexertion from repetitive movements, initial encounter: Secondary | ICD-10-CM | POA: Insufficient documentation

## 2013-06-04 DIAGNOSIS — Z8619 Personal history of other infectious and parasitic diseases: Secondary | ICD-10-CM | POA: Insufficient documentation

## 2013-06-04 DIAGNOSIS — Y9289 Other specified places as the place of occurrence of the external cause: Secondary | ICD-10-CM | POA: Insufficient documentation

## 2013-06-04 DIAGNOSIS — Y990 Civilian activity done for income or pay: Secondary | ICD-10-CM | POA: Insufficient documentation

## 2013-06-04 DIAGNOSIS — X500XXA Overexertion from strenuous movement or load, initial encounter: Secondary | ICD-10-CM | POA: Insufficient documentation

## 2013-06-04 MED ORDER — NAPROXEN 500 MG PO TABS: 500.0000 mg | ORAL_TABLET | Freq: Two times a day (BID) | ORAL | Status: DC

## 2013-06-04 MED ORDER — METHOCARBAMOL 500 MG PO TABS: 500.0000 mg | ORAL_TABLET | Freq: Two times a day (BID) | ORAL | Status: DC

## 2013-06-04 MED ORDER — TRAMADOL HCL 50 MG PO TABS: 50.0000 mg | ORAL_TABLET | Freq: Four times a day (QID) | ORAL | Status: DC | PRN

## 2013-06-04 NOTE — ED Provider Notes (Signed)
CSN: [id number]     Arrival date & time 06/04/13  1450 History  This chart was scribed for non-physician practitioner Magnus Sinning, PA-C, working with Shanna Cisco, MD by Dorothey Baseman, ED Scribe. This patient was seen in room WTR6/WTR6 and the patient's care was started at 4:19 PM.   First MD Initiated Contact with Patient 06/04/13 1553     Chief Complaint  Patient presents with  . Chest Pain    heavy lifting 2 weeks ago.    The history is provided by the patient. No language interpreter was used.    HPI Comments: Gabriel Garcia is a 37 y.o. male who presents to the Emergency Department complaining of left rib pain that has been constant and gradually worsening for 2 weeks. He reports that 2 weeks ago he injured his ribs while moving pallets at work, he was seen at [facility], and was diagnosed with a pulled muscle and was given an anti-inflammatory and Tramadol, but reports he did not have an x-ray. He reports the pain is exacerbated when he lifts his left arm, but not with deep breathing or coughing. He reports the pain began radiating to his left, lateral neck yesterday. He denies any numbness or paresthesias in his arms. He states he has been using Ibuprofen with mild, temporary relief. Patient denies cough, SOB, fever, chills, or any other recent symptoms.   Past Medical History  Diagnosis Date  . Hypertension   . Hepatitis C    Past Surgical History  Procedure Laterality Date  . Eye surgery    . Foot surgery     Family History  Problem Relation Age of Onset  . Rheum arthritis Mother   . Hypertension Mother   . Heart failure Father    History  Substance Use Topics  . Smoking status: Never Smoker   . Smokeless tobacco: Never Used  . Alcohol Use: Yes     Comment: occasionally    Review of Systems A complete 10 system review of systems was obtained and all systems are negative except as noted in the HPI and PMH.   Allergies  Review of patient's allergies  indicates no known allergies.  Home Medications   Current Outpatient Rx  Name  Route  Sig  Dispense  Refill  . HYDROcodone-acetaminophen (NORCO) 5-325 MG per tablet   Oral   Take 1 tablet by mouth every 6 (six) hours as needed for pain.   30 tablet   0   . indomethacin (INDOCIN) 25 MG capsule   Oral   Take 1 capsule (25 mg total) by mouth 3 (three) times daily as needed.   30 capsule   0   . lisinopril (PRINIVIL,ZESTRIL) 10 MG tablet   Oral   Take 10 mg by mouth 2 (two) times daily.         . metoprolol tartrate (LOPRESSOR) 25 MG tablet   Oral   Take 25 mg by mouth 2 (two) times daily.         . predniSONE (DELTASONE) 20 MG tablet   Oral   Take 2 tablets (40 mg total) by mouth daily.   10 tablet   0   . promethazine (PHENERGAN) 25 MG tablet   Oral   Take 1 tablet (25 mg total) by mouth every 6 (six) hours as needed for nausea.   12 tablet   0    Triage Vitals: BP 146/97  Pulse 72  Temp(Src) 98.2 F (36.8 C) (Oral)  Resp 20  SpO2 98%  Physical Exam  Nursing note and vitals reviewed. Constitutional: He is oriented to person, place, and time. He appears well-developed and well-nourished.  HENT:  Head: Normocephalic and atraumatic.  Eyes: Conjunctivae are normal. Pupils are equal, round, and reactive to light.  Neck: Normal range of motion. Neck supple.  Cardiovascular: Normal rate and regular rhythm.   Pulmonary/Chest: Effort normal and breath sounds normal.  Tenderness to palpation of left lower anterior rib.   Neurological: He is alert and oriented to person, place, and time.  Skin: Skin is warm and dry.  Psychiatric: He has a normal mood and affect. His behavior is normal.    ED Course   DIAGNOSTIC STUDIES: Oxygen Saturation is 98% on room air, normal by my interpretation.    COORDINATION OF CARE: 4:22PM- Patient offered a CXR, but declined. Will discharge patient with prescriptions for Naproxen, Tramadol, and methocarbamol to manage symptoms.     Procedures (including critical care time)  Labs Reviewed - No data to display  No results found.  1. Muscle strain     MDM  Patient presenting with pain of his left anterior rib, which has been present for 2 weeks after lifting pallets at work.  Patient declined CXR.  No SOB.  Will treat with Robaxin and Naproxen.  Patient stable for discharge.  Return precautions given.  I personally performed the services described in this documentation, which was scribed in my presence. The recorded information has been reviewed and is accurate.    Pascal Lux Harrah, PA-C 06/07/13 [id number]

## 2013-06-04 NOTE — ED Notes (Signed)
Patient reports that he was lifting pallets 2 weeks ago and went to [facility] with pain of the left rib area. Patient states he was diagnosed with a pulled muscle and was given an antiinflammatory and Tramadol. Patient states he is now experiencing increased pain with movement and is now having left lateral neck pain.

## 2013-06-07 NOTE — ED Provider Notes (Signed)
Medical screening examination/treatment/procedure(s) were performed by non-physician practitioner and as supervising physician I was immediately available for consultation/collaboration.   Shanna Cisco, MD 06/07/13 1151
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
## 2013-11-04 ENCOUNTER — Emergency Department (HOSPITAL_COMMUNITY)
Admission: EM | Admit: 2013-11-04 | Discharge: 2013-11-04 | Disposition: A | Discharge status: Home / Self Care | Payer: 59 | Attending: Emergency Medicine | Admitting: Emergency Medicine

## 2013-11-04 ENCOUNTER — Emergency Department (HOSPITAL_COMMUNITY): Payer: 59

## 2013-11-04 ENCOUNTER — Encounter (HOSPITAL_COMMUNITY): Payer: Self-pay | Admitting: Emergency Medicine

## 2013-11-04 DIAGNOSIS — W208XXA Other cause of strike by thrown, projected or falling object, initial encounter: Secondary | ICD-10-CM | POA: Insufficient documentation

## 2013-11-04 DIAGNOSIS — Z79899 Other long term (current) drug therapy: Secondary | ICD-10-CM | POA: Insufficient documentation

## 2013-11-04 DIAGNOSIS — Y9289 Other specified places as the place of occurrence of the external cause: Secondary | ICD-10-CM | POA: Insufficient documentation

## 2013-11-04 DIAGNOSIS — Y990 Civilian activity done for income or pay: Secondary | ICD-10-CM | POA: Insufficient documentation

## 2013-11-04 DIAGNOSIS — Y9389 Activity, other specified: Secondary | ICD-10-CM | POA: Insufficient documentation

## 2013-11-04 DIAGNOSIS — I10 Essential (primary) hypertension: Secondary | ICD-10-CM | POA: Insufficient documentation

## 2013-11-04 DIAGNOSIS — Z8619 Personal history of other infectious and parasitic diseases: Secondary | ICD-10-CM | POA: Insufficient documentation

## 2013-11-04 DIAGNOSIS — S90129A Contusion of unspecified lesser toe(s) without damage to nail, initial encounter: Secondary | ICD-10-CM | POA: Insufficient documentation

## 2013-11-04 DIAGNOSIS — S90112A Contusion of left great toe without damage to nail, initial encounter: Secondary | ICD-10-CM

## 2013-11-04 MED ORDER — HYDROCODONE-ACETAMINOPHEN 5-325 MG PO TABS: 1.0000 | ORAL_TABLET | ORAL | Status: DC | PRN

## 2013-11-04 MED ORDER — HYDROCODONE-ACETAMINOPHEN 5-325 MG PO TABS
1.0000 | ORAL_TABLET | Freq: Once | ORAL | Status: AC
  Administered 2013-11-04: 1 via ORAL
  Filled 2013-11-04: qty 1

## 2013-11-04 NOTE — Discharge Instructions (Signed)
Take vicodin as prescribed for severe pain.   Do not drive within four hours of taking this medication (may cause drowsiness or confusion).  Take up to 800mg  of ibuprofen three times a day for the next 3-4 days (take with food).  Ice 3 times a day for 15-20 minutes.  Elevate when possible to decrease swelling and pain.  Activity as tolerated.  You may return to the ER if your pain worsens or you have any other concerns.

## 2013-11-04 NOTE — ED Provider Notes (Signed)
CSN: [id number]     Arrival date & time 11/04/13  1943 History  This chart was scribed for non-physician practitioner Kyung Bacca, PA-C working with Richardean Canal, MD by Joaquin Music, ED Scribe. This patient was seen in room WTR8/WTR8 and the patient's care was started at  9:30 PM  Chief Complaint  Patient presents with  . Toe Injury   The history is provided by the patient. No language interpreter was used.   HPI Comments: Gabriel Garcia is a 38 y.o. male who presents to the Emergency Department complaining of ongoing L 1st toe pain that began 11 hours ago due to an accident that occured work today. Pt states he dropped a wood pallet made of pine on his toe. He states he is unsure how much the pallet weighed due to the weight variation with pallets. Pt states he has been forcing weight to his toe but states he has pain with weight bearing. He states his pain is shooting up his L leg. Pt denies numbness and tingling. Pt denies any other injuries.  Past Medical History  Diagnosis Date  . Hypertension   . Hepatitis C    Past Surgical History  Procedure Laterality Date  . Eye surgery    . Foot surgery     Family History  Problem Relation Age of Onset  . Rheum arthritis Mother   . Hypertension Mother   . Heart failure Father    History  Substance Use Topics  . Smoking status: Never Smoker   . Smokeless tobacco: Never Used  . Alcohol Use: Yes     Comment: occasionally    Review of Systems  All other systems reviewed and are negative.   Allergies  Review of patient's allergies indicates no known allergies.  Home Medications   Current Outpatient Rx  Name  Route  Sig  Dispense  Refill  . lisinopril (PRINIVIL,ZESTRIL) 10 MG tablet   Oral   Take 10 mg by mouth 2 (two) times daily.         . metoprolol tartrate (LOPRESSOR) 25 MG tablet   Oral   Take 25 mg by mouth 2 (two) times daily.          BP 142/92  Pulse 72  Temp(Src) 98.1 F (36.7 C) (Oral)   Resp 15  Ht 5\' 10"  (1.778 m)  Wt 210 lb (95.255 kg)  BMI 30.13 kg/m2  SpO2 98%  Physical Exam  Nursing note and vitals reviewed. Constitutional: He is oriented to person, place, and time. He appears well-developed and well-nourished. No distress.  HENT:  Head: Normocephalic and atraumatic.  Eyes:  Normal appearance  Neck: Normal range of motion.  Pulmonary/Chest: Effort normal.  Musculoskeletal: Normal range of motion.  Ecchymosis at distal interphalangeal joint of L great toe.  Tenderness of entire toe as well as plantar surface of base of 1st-3rd metatarsals.  Pain w/ passive flexion of great toe and second toes.  Sensation of all toes intact.  2+ DP pulse.   Neurological: He is alert and oriented to person, place, and time.  Psychiatric: He has a normal mood and affect. His behavior is normal.    ED Course  Procedures  DIAGNOSTIC STUDIES: Oxygen Saturation is 98% on RA, nomrla by my interpretation.    COORDINATION OF CARE: 9:33 PM-Discussed treatment plan which includes L foot X-ray and administer pain medication while in ED. Pt agreed to plan.   Labs Review Labs Reviewed - No data to display Imaging Review  No results found.  EKG Interpretation   None      MDM   1. Contusion of great toe of left foot    37yo healthy M dropped a wood palate on L great toe today.  Xray neg for fx/dislocation.  No NV deficits on exam.  Ortho tech buddy taped toes and provided pt w/ post-op shoe.  He received one dose of vicodin in ED and d/c'd home w/ 10 more.  Recommended ice, elevation and NSAID.  10:05 PM   I personally performed the services described in this documentation, which was scribed in my presence. The recorded information has been reviewed and is accurate.    Otilio MiuCatherine E Katilyn Miltenberger, PA-C 11/04/13 2207

## 2013-11-04 NOTE — ED Notes (Signed)
Pt to radiology at this time.

## 2013-11-04 NOTE — ED Notes (Signed)
Pt reports that earlier today at approximately 10:00 am he was at work and an empty pallet fell over and landed on the tip of his left great toe. Pt is A/O x4, in NAD, and vital signs are WDL.

## 2013-11-04 NOTE — ED Provider Notes (Signed)
Medical screening examination/treatment/procedure(s) were performed by non-physician practitioner and as supervising physician I was immediately available for consultation/collaboration.  EKG Interpretation   None         Ravynn Hogate H Azuree Minish, MD 11/04/13 2329 

## 2013-11-04 NOTE — ED Notes (Signed)
Initial contact - pt sitting up on chair, assisted to reposition for comfort. Pt c/o L toe pain, onset earlier today at work when a pallet fell on foot.  No obvious deformity noted.  +bruising noted.  Ice offered and declined at this time.  Pt denies other complaints, nad, awaiting EDP eval.

## 2013-11-13 ENCOUNTER — Emergency Department (HOSPITAL_COMMUNITY)
Admission: EM | Admit: 2013-11-13 | Discharge: 2013-11-13 | Disposition: A | Discharge status: Home / Self Care | Payer: 59 | Attending: Emergency Medicine | Admitting: Emergency Medicine

## 2013-11-13 ENCOUNTER — Encounter (HOSPITAL_COMMUNITY): Payer: Self-pay | Admitting: Emergency Medicine

## 2013-11-13 DIAGNOSIS — R000 Tachycardia, unspecified: Secondary | ICD-10-CM | POA: Insufficient documentation

## 2013-11-13 DIAGNOSIS — I10 Essential (primary) hypertension: Secondary | ICD-10-CM | POA: Insufficient documentation

## 2013-11-13 DIAGNOSIS — Z8619 Personal history of other infectious and parasitic diseases: Secondary | ICD-10-CM | POA: Insufficient documentation

## 2013-11-13 DIAGNOSIS — R52 Pain, unspecified: Secondary | ICD-10-CM | POA: Insufficient documentation

## 2013-11-13 DIAGNOSIS — K2970 Gastritis, unspecified, without bleeding: Secondary | ICD-10-CM | POA: Insufficient documentation

## 2013-11-13 DIAGNOSIS — K2990 Gastroduodenitis, unspecified, without bleeding: Principal | ICD-10-CM

## 2013-11-13 DIAGNOSIS — R112 Nausea with vomiting, unspecified: Secondary | ICD-10-CM

## 2013-11-13 DIAGNOSIS — IMO0001 Reserved for inherently not codable concepts without codable children: Secondary | ICD-10-CM | POA: Insufficient documentation

## 2013-11-13 DIAGNOSIS — Z79899 Other long term (current) drug therapy: Secondary | ICD-10-CM | POA: Insufficient documentation

## 2013-11-13 LAB — COMPREHENSIVE METABOLIC PANEL
ALBUMIN: 4 g/dL (ref 3.5–5.2)
ALT: 108 U/L — ABNORMAL HIGH (ref 0–53)
AST: 47 U/L — ABNORMAL HIGH (ref 0–37)
Alkaline Phosphatase: 61 U/L (ref 39–117)
BUN: 11 mg/dL (ref 6–23)
CO2: 25 mEq/L (ref 19–32)
CREATININE: 1.08 mg/dL (ref 0.50–1.35)
Calcium: 9 mg/dL (ref 8.4–10.5)
Chloride: 100 mEq/L (ref 96–112)
GFR calc Af Amer: >90 mL/min (ref >90)
GFR calc non Af Amer: 86 mL/min — ABNORMAL LOW (ref >90)
Glucose, Bld: 104 mg/dL — ABNORMAL HIGH (ref 70–99)
POTASSIUM: 3.6 meq/L — AB (ref 3.7–5.3)
Sodium: 140 mEq/L (ref 137–147)
TOTAL PROTEIN: 8.4 g/dL — AB (ref 6.0–8.3)
Total Bilirubin: 0.6 mg/dL (ref 0.3–1.2)

## 2013-11-13 LAB — CBC
HEMATOCRIT: 45.9 % (ref 39.0–52.0)
Hemoglobin: 15.3 g/dL (ref 13.0–17.0)
MCH: 31.5 pg (ref 26.0–34.0)
MCHC: 33.3 g/dL (ref 30.0–36.0)
MCV: 94.6 fL (ref 78.0–100.0)
Platelets: 167 10*3/uL (ref 150–400)
RBC: 4.85 MIL/uL (ref 4.22–5.81)
RDW: 14.3 % (ref 11.5–15.5)
WBC: 5.8 10*3/uL (ref 4.0–10.5)

## 2013-11-13 LAB — LIPASE, BLOOD: LIPASE: 18 U/L (ref 11–59)

## 2013-11-13 MED ORDER — SODIUM CHLORIDE 0.9 % IV BOLUS (SEPSIS)
1000.0000 mL | Freq: Once | INTRAVENOUS | Status: AC
  Administered 2013-11-13: 1000 mL via INTRAVENOUS

## 2013-11-13 MED ORDER — ONDANSETRON HCL 4 MG/2ML IJ SOLN
4.0000 mg | Freq: Once | INTRAMUSCULAR | Status: AC
  Administered 2013-11-13: 4 mg via INTRAVENOUS
  Filled 2013-11-13: qty 2

## 2013-11-13 MED ORDER — PANTOPRAZOLE SODIUM 20 MG PO TBEC: 20.0000 mg | DELAYED_RELEASE_TABLET | Freq: Every day | ORAL | Status: DC

## 2013-11-13 MED ORDER — ONDANSETRON 4 MG PO TBDP: ORAL_TABLET | ORAL | Status: DC

## 2013-11-13 MED ORDER — PANTOPRAZOLE SODIUM 40 MG IV SOLR
40.0000 mg | Freq: Once | INTRAVENOUS | Status: AC
  Administered 2013-11-13: 40 mg via INTRAVENOUS
  Filled 2013-11-13: qty 40

## 2013-11-13 NOTE — Discharge Instructions (Signed)
Take zofran as directed as needed for nausea. Take protonix as directed. Stay well hydrated.  Gastritis, Adult Gastritis is soreness and swelling (inflammation) of the lining of the stomach. Gastritis can develop as a sudden onset (acute) or long-term (chronic) condition. If gastritis is not treated, it can lead to stomach bleeding and ulcers. CAUSES  Gastritis occurs when the stomach lining is weak or damaged. Digestive juices from the stomach then inflame the weakened stomach lining. The stomach lining may be weak or damaged due to viral or bacterial infections. One common bacterial infection is the Helicobacter pylori infection. Gastritis can also result from excessive alcohol consumption, taking certain medicines, or having too much acid in the stomach.  SYMPTOMS  In some cases, there are no symptoms. When symptoms are present, they may include:  Pain or a burning sensation in the upper abdomen.  Nausea.  Vomiting.  An uncomfortable feeling of fullness after eating. DIAGNOSIS  Your caregiver may suspect you have gastritis based on your symptoms and a physical exam. To determine the cause of your gastritis, your caregiver may perform the following:  Blood or stool tests to check for the H pylori bacterium.  Gastroscopy. A thin, flexible tube (endoscope) is passed down the esophagus and into the stomach. The endoscope has a light and camera on the end. Your caregiver uses the endoscope to view the inside of the stomach.  Taking a tissue sample (biopsy) from the stomach to examine under a microscope. TREATMENT  Depending on the cause of your gastritis, medicines may be prescribed. If you have a bacterial infection, such as an H pylori infection, antibiotics may be given. If your gastritis is caused by too much acid in the stomach, H2 blockers or antacids may be given. Your caregiver may recommend that you stop taking aspirin, ibuprofen, or other nonsteroidal anti-inflammatory drugs  (NSAIDs). HOME CARE INSTRUCTIONS  Only take over-the-counter or prescription medicines as directed by your caregiver.  If you were given antibiotic medicines, take them as directed. Finish them even if you start to feel better.  Drink enough fluids to keep your urine clear or pale yellow.  Avoid foods and drinks that make your symptoms worse, such as:  Caffeine or alcoholic drinks.  Chocolate.  Peppermint or mint flavorings.  Garlic and onions.  Spicy foods.  Citrus fruits, such as oranges, lemons, or limes.  Tomato-based foods such as sauce, chili, salsa, and pizza.  Fried and fatty foods.  Eat small, frequent meals instead of large meals. SEEK IMMEDIATE MEDICAL CARE IF:   You have black or dark red stools.  You vomit blood or material that looks like coffee grounds.  You are unable to keep fluids down.  Your abdominal pain gets worse.  You have a fever.  You do not feel better after 1 week.  You have any other questions or concerns. MAKE SURE YOU:  Understand these instructions.  Will watch your condition.  Will get help right away if you are not doing well or get worse. Document Released: 09/24/2001 Document Revised: 03/31/2012 Document Reviewed: 11/13/2011 [facility] Patient Information 2014 [place], [place].  Nausea and Vomiting Nausea means you feel sick to your stomach. Throwing up (vomiting) is a reflex where stomach contents come out of your mouth. HOME CARE   Take medicine as told by your doctor.  Do not force yourself to eat. However, you do need to drink fluids.  If you feel like eating, eat a normal diet as told by your doctor.  Eat  rice, wheat, potatoes, bread, lean meats, yogurt, fruits, and vegetables.  Avoid high-fat foods.  Drink enough fluids to keep your pee (urine) clear or pale yellow.  Ask your doctor how to replace body fluid losses (rehydrate). Signs of body fluid loss (dehydration) include:  Feeling very thirsty.  Dry  lips and mouth.  Feeling dizzy.  Dark pee.  Peeing less than normal.  Feeling confused.  Fast breathing or heart rate. GET HELP RIGHT AWAY IF:   You have blood in your throw up.  You have black or bloody poop (stool).  You have a bad headache or stiff neck.  You feel confused.  You have bad belly (abdominal) pain.  You have chest pain or trouble breathing.  You do not pee at least once every 8 hours.  You have cold, clammy skin.  You keep throwing up after 24 to 48 hours.  You have a fever. MAKE SURE YOU:   Understand these instructions.  Will watch your condition.  Will get help right away if you are not doing well or get worse. Document Released: 03/18/2008 Document Revised: 12/23/2011 Document Reviewed: 03/01/2011 [facility] Patient Information 2014 [place], [place].

## 2013-11-13 NOTE — ED Provider Notes (Signed)
Medical screening examination/treatment/procedure(s) were performed by non-physician practitioner and as supervising physician I was immediately available for consultation/collaboration.  EKG Interpretation   None         Ericberto Padget, MD 11/13/13 2038 

## 2013-11-13 NOTE — ED Notes (Signed)
He c/o vomiting several times, plus body aches since yesterday.  He is in no distress.

## 2013-11-13 NOTE — ED Notes (Signed)
Pt c/o nausea and vomiting x 2 days. Pt sts he took a nap this afternoon, woke up and vomited a large amount of clear vomit and "just felt horrible." Pt has not vomited since but has been dry heaving. Pt sts he has generalized aches all over. Pt denies fever, chills, dizziness, lightheadedness, chest pain, sob. A&Ox4. NAD noted.

## 2013-11-13 NOTE — ED Provider Notes (Signed)
CSN: [id number]     Arrival date & time 11/13/13  1531 History   First MD Initiated Contact with Patient 11/13/13 1613     Chief Complaint  Patient presents with  . Emesis   (Consider location/radiation/quality/duration/timing/severity/associated sxs/prior Treatment) HPI Comments: Pt is a 38 y/o male with a PMHx of HTN and hepatitis C who presents to the ED complaining of nausea, vomiting and generalized body aches x2 days. Pt states he woke up yesterday not feeling well, felt nauseated, vomited a few times, began to feel better, had popcorn, went to sleep and woke up this morning feeling worse. He has had multiple episodes of non-bloody emesis, has no appetite, feels fatigues, body aches. Denies abdominal pain, diarrhea, fever, chills. No sick contacts.  Patient is a 38 y.o. male presenting with vomiting. The history is provided by the patient.  Emesis Associated symptoms: arthralgias and myalgias     Past Medical History  Diagnosis Date  . Hypertension   . Hepatitis C    Past Surgical History  Procedure Laterality Date  . Eye surgery    . Foot surgery     Family History  Problem Relation Age of Onset  . Rheum arthritis Mother   . Hypertension Mother   . Heart failure Father    History  Substance Use Topics  . Smoking status: Never Smoker   . Smokeless tobacco: Never Used  . Alcohol Use: Yes     Comment: occasionally    Review of Systems  Constitutional: Positive for appetite change.  Gastrointestinal: Positive for nausea and vomiting.  Musculoskeletal: Positive for arthralgias and myalgias.  All other systems reviewed and are negative.    Allergies  Review of patient's allergies indicates no known allergies.  Home Medications   Current Outpatient Rx  Name  Route  Sig  Dispense  Refill  . lisinopril (PRINIVIL,ZESTRIL) 10 MG tablet   Oral   Take 10 mg by mouth 2 (two) times daily.         . metoprolol tartrate (LOPRESSOR) 25 MG tablet   Oral   Take 25 mg  by mouth 2 (two) times daily.         . ondansetron (ZOFRAN ODT) 4 MG disintegrating tablet      4mg  ODT q4 hours prn nausea/vomit   4 tablet   0   . pantoprazole (PROTONIX) 20 MG tablet   Oral   Take 1 tablet (20 mg total) by mouth daily.   30 tablet   0    BP 140/81  Pulse 115  Temp(Src) 98.1 F (36.7 C) (Oral)  Resp 18  SpO2 100% Physical Exam  Nursing note and vitals reviewed. Constitutional: He is oriented to person, place, and time. He appears well-developed and well-nourished. No distress.  HENT:  Head: Normocephalic and atraumatic.  Mouth/Throat: Oropharynx is clear and moist. Mucous membranes are dry.  Eyes: Conjunctivae are normal.  Neck: Normal range of motion. Neck supple.  Cardiovascular: Regular rhythm, normal heart sounds and intact distal pulses.  Tachycardia present.   Pulmonary/Chest: Effort normal and breath sounds normal.  Abdominal: Soft. Normal appearance and bowel sounds are normal. He exhibits no distension. There is no tenderness.  Palpation of mid-epigastric region causes nausea.  Musculoskeletal: Normal range of motion. He exhibits no edema.  Neurological: He is alert and oriented to person, place, and time.  Skin: Skin is warm and dry. He is not diaphoretic.  Psychiatric: He has a normal mood and affect. His behavior is normal.  ED Course  Procedures (including critical care time) Labs Review Labs Reviewed  COMPREHENSIVE METABOLIC PANEL - Abnormal; Notable for the following:    Potassium 3.6 (*)    Glucose, Bld 104 (*)    Total Protein 8.4 (*)    AST 47 (*)    ALT 108 (*)    GFR calc non Af Amer 86 (*)    All other components within normal limits  CBC  LIPASE, BLOOD   Imaging Review No results found.  EKG Interpretation   None       MDM   1. Gastritis   2. Nausea & vomiting     Pt presenting with nausea and vomiting. He is in NAD, afebrile. Tachycardic. Dry MM. Abdomen non-tender. Labs pending. Pt receiving IV  fluids, zofran. 5:48 PM Labs without acute finding. No leukocytosis. LFTs at baseline. Pt reports some improvement of his symptoms with fluids and zofran. Protonix given. Tolerating PO. Stable for discharge. F/u with PCP. Return precautions given. Patient states understanding of treatment care plan and is agreeable.    Trevor MaceRobyn M Albert, PA-C 11/13/13 [phone]

## 2013-11-15 ENCOUNTER — Emergency Department (HOSPITAL_COMMUNITY)
Admission: EM | Admit: 2013-11-15 | Discharge: 2013-11-15 | Disposition: A | Discharge status: Home / Self Care | Payer: 59 | Attending: Emergency Medicine | Admitting: Emergency Medicine

## 2013-11-15 ENCOUNTER — Encounter (HOSPITAL_COMMUNITY): Payer: Self-pay | Admitting: Emergency Medicine

## 2013-11-15 DIAGNOSIS — R1011 Right upper quadrant pain: Secondary | ICD-10-CM | POA: Insufficient documentation

## 2013-11-15 DIAGNOSIS — H548 Legal blindness, as defined in USA: Secondary | ICD-10-CM | POA: Insufficient documentation

## 2013-11-15 DIAGNOSIS — R1031 Right lower quadrant pain: Secondary | ICD-10-CM | POA: Insufficient documentation

## 2013-11-15 DIAGNOSIS — R1110 Vomiting, unspecified: Secondary | ICD-10-CM

## 2013-11-15 DIAGNOSIS — R1013 Epigastric pain: Secondary | ICD-10-CM | POA: Insufficient documentation

## 2013-11-15 DIAGNOSIS — Z8619 Personal history of other infectious and parasitic diseases: Secondary | ICD-10-CM | POA: Insufficient documentation

## 2013-11-15 DIAGNOSIS — R109 Unspecified abdominal pain: Secondary | ICD-10-CM

## 2013-11-15 DIAGNOSIS — R1033 Periumbilical pain: Secondary | ICD-10-CM | POA: Insufficient documentation

## 2013-11-15 DIAGNOSIS — R197 Diarrhea, unspecified: Secondary | ICD-10-CM

## 2013-11-15 DIAGNOSIS — I10 Essential (primary) hypertension: Secondary | ICD-10-CM | POA: Insufficient documentation

## 2013-11-15 MED ORDER — METOCLOPRAMIDE HCL 10 MG PO TABS: 10.0000 mg | ORAL_TABLET | Freq: Four times a day (QID) | ORAL | Status: DC | PRN

## 2013-11-15 MED ORDER — HYDROCODONE-ACETAMINOPHEN 5-325 MG PO TABS: 2.0000 | ORAL_TABLET | ORAL | Status: DC | PRN

## 2013-11-15 MED ORDER — ONDANSETRON 8 MG PO TBDP: ORAL_TABLET | ORAL | Status: DC

## 2013-11-15 NOTE — ED Notes (Signed)
Pt c/o vomiting and abd pain since Saturday. Was seen here on Saturday. States it kind of went away then this morning at work sharp pains and the vomiting came back. Pt actively vomiting in triage.

## 2013-11-15 NOTE — Discharge Instructions (Signed)
Clear liquid diet only, until recheck.  Abdominal (belly) pain can be caused by many things. Your caregiver performed an examination and possibly ordered blood/urine tests and imaging (CT scan, x-rays, ultrasound). Many cases can be observed and treated at home after initial evaluation in the emergency department. Even though you are being discharged home, abdominal pain can be unpredictable. Therefore, you need a repeated exam if your pain does not resolve, returns, or worsens. Most patients with abdominal pain don't have to be admitted to the hospital or have surgery, but serious problems like appendicitis and gallbladder attacks can start out as nonspecific pain. Many abdominal conditions cannot be diagnosed in one visit, so follow-up evaluations are very important. SEEK IMMEDIATE MEDICAL ATTENTION IF: The pain does not go away or becomes severe.  A temperature above 101 develops.  Repeated vomiting occurs (multiple episodes).  The pain becomes localized to portions of the abdomen. The right side could possibly be appendicitis. In an adult, the left lower portion of the abdomen could be colitis or diverticulitis.  Blood is being passed in stools or vomit (bright red or black tarry stools).  Return also if you develop chest pain, difficulty breathing, dizziness or fainting, or become confused, poorly responsive, or inconsolable (young children).

## 2013-11-15 NOTE — ED Provider Notes (Signed)
CSN: [id number]     Arrival date & time 11/15/13  1543 History   First MD Initiated Contact with Patient 11/15/13 1620     Chief Complaint  Patient presents with  . Emesis  . Abdominal Pain   (Consider location/radiation/quality/duration/timing/severity/associated sxs/prior Treatment) HPI 38 year old male presents with less than 2 hours of diffuse abdominal pain with a couple episodes of nonbloody vomiting and one episode of nonbloody loose stool this afternoon, he states that on Thursday Friday and Saturday he had diffuse body aches with chills and vomited on Saturday which was 2 days ago, she had no abdominal pain at that time, yesterday Sunday he had nausea but no vomiting no abdominal pain, he was able to use a small amount yesterday and drink fluids but had decreased appetite yesterday, today he felt fine all day and was able to use small lunch today until the last couple hours he developed diffuse waxing and waning crampy moderately severe abdominal pain with 2 episodes of vomiting and one loose stool upon arrival to the emergency department. He has no chills no body aches the last 2 days. He is no rash no confusion no headache no chest pain cough shortness of breath. He is no testicular pain no dysuria. There is no treatment prior to arrival. His abdominal pain is diffuse and mild now. He does not want lab testing or IV fluids or imaging if possible. Past Medical History  Diagnosis Date  . Hypertension   . Hepatitis C   . 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 Eye surgery Right   . Foot surgery Right    Family History  Problem Relation Age of Onset  . Rheum arthritis Mother   . Hypertension Mother   . Heart failure Father   . Colon cancer Neg Hx    History  Substance Use Topics  . Smoking status: Never Smoker   . Smokeless tobacco: Never Used  . Alcohol Use: Yes     Comment: occasionally    Review of  Systems 10 Systems reviewed and are negative for acute change except as noted in the HPI. Allergies  Review of patient's allergies indicates no known allergies.  Home Medications   Current Outpatient Rx  Name  Route  Sig  Dispense  Refill  . ibuprofen (ADVIL,MOTRIN) 200 MG tablet   Oral   Take 400 mg by mouth every 6 (six) hours as needed (pain).         [name] lisinopril (PRINIVIL,ZESTRIL) 10 MG tablet   Oral   Take 10 mg by mouth 2 (two) times daily.         . metoprolol tartrate (LOPRESSOR) 25 MG tablet   Oral   Take 25 mg by mouth 2 (two) times daily.         [name] MOVIPREP 100 G SOLR   Oral   Take 1 kit (200 g total) by mouth as directed.   1 kit   0     Dispense as written.   [name] oxyCODONE-acetaminophen (PERCOCET/ROXICET) 5-325 MG per tablet   Oral   Take 1 tablet by mouth every 6 (six) hours as needed for severe pain.   20 tablet   0   . pantoprazole (PROTONIX) 40 MG tablet   Oral   Take 1 tablet (40 mg total) by mouth daily.   90 tablet   3   . promethazine (PHENERGAN) 25 MG tablet  Oral   Take 1 tablet (25 mg total) by mouth every 8 (eight) hours as needed for nausea or vomiting.   30 tablet   0    BP 138/72  Pulse 72  Temp(Src) 98.5 F (36.9 C) (Oral)  Resp 20  SpO2 98% Physical Exam  Nursing note and vitals reviewed. Constitutional:  Awake, alert, nontoxic appearance.  HENT:  Head: Atraumatic.  Eyes: Right eye exhibits no discharge. Left eye exhibits no discharge.  Neck: Neck supple.  Cardiovascular: Normal rate and regular rhythm.   No murmur heard. Pulmonary/Chest: Effort normal and breath sounds normal. No respiratory distress. He has no wheezes. He has no rales. He exhibits no tenderness.  Abdominal: Soft. Bowel sounds are normal. He exhibits no distension and no mass. There is tenderness. There is no rebound and no guarding.  Minimal tenderness to left side of abdomen mild tenderness to epigastrium right upper quadrant right lower quadrant  and suprapubic area and periumbilical region with no rebound  Genitourinary:  Testicles are nontender no palpable hernias  Musculoskeletal: He exhibits no tenderness.  Baseline ROM, no obvious new focal weakness.  Neurological: He is alert.  Mental status and motor strength appears baseline for patient and situation.  Skin: No rash noted.  Psychiatric: He has a normal mood and affect.    ED Course  Procedures (including critical care time) Pt prefers no testing or IVF at this time; do not feel CT mandated; Pt aware cannot rule out appy but feel recheck tomorrow reasonable since vomit and diarrhea more likely related to recent suspected viral syndrome.  Labs Review Labs Reviewed - No data to display Imaging Review No results found.  EKG Interpretation   None       MDM   1. Abdominal pain   2. Vomiting and diarrhea    I doubt any other EMC precluding discharge at this time including, but not necessarily limited to the following:peritonitis, SBI.    Babette Relic, MD 11/19/13 2114

## 2013-11-16 ENCOUNTER — Encounter (HOSPITAL_COMMUNITY): Payer: Self-pay | Admitting: Emergency Medicine

## 2013-11-16 ENCOUNTER — Emergency Department (HOSPITAL_COMMUNITY): Payer: 59

## 2013-11-16 ENCOUNTER — Emergency Department (HOSPITAL_COMMUNITY)
Admission: EM | Admit: 2013-11-16 | Discharge: 2013-11-16 | Disposition: A | Discharge status: Home / Self Care | Payer: 59 | Attending: Emergency Medicine | Admitting: Emergency Medicine

## 2013-11-16 DIAGNOSIS — R000 Tachycardia, unspecified: Secondary | ICD-10-CM | POA: Insufficient documentation

## 2013-11-16 DIAGNOSIS — R1031 Right lower quadrant pain: Secondary | ICD-10-CM

## 2013-11-16 DIAGNOSIS — R197 Diarrhea, unspecified: Secondary | ICD-10-CM | POA: Insufficient documentation

## 2013-11-16 DIAGNOSIS — R112 Nausea with vomiting, unspecified: Secondary | ICD-10-CM

## 2013-11-16 DIAGNOSIS — Z79899 Other long term (current) drug therapy: Secondary | ICD-10-CM | POA: Insufficient documentation

## 2013-11-16 DIAGNOSIS — Z8619 Personal history of other infectious and parasitic diseases: Secondary | ICD-10-CM | POA: Insufficient documentation

## 2013-11-16 DIAGNOSIS — R51 Headache: Secondary | ICD-10-CM | POA: Insufficient documentation

## 2013-11-16 DIAGNOSIS — K5000 Crohn's disease of small intestine without complications: Secondary | ICD-10-CM

## 2013-11-16 DIAGNOSIS — E860 Dehydration: Secondary | ICD-10-CM

## 2013-11-16 DIAGNOSIS — I10 Essential (primary) hypertension: Secondary | ICD-10-CM | POA: Insufficient documentation

## 2013-11-16 LAB — URINALYSIS, ROUTINE W REFLEX MICROSCOPIC
GLUCOSE, UA: NEGATIVE mg/dL
Hgb urine dipstick: NEGATIVE
Ketones, ur: NEGATIVE mg/dL
Leukocytes, UA: NEGATIVE
Nitrite: NEGATIVE
PH: 6 (ref 5.0–8.0)
Protein, ur: NEGATIVE mg/dL
Specific Gravity, Urine: 1.02 (ref 1.005–1.030)
Urobilinogen, UA: 0.2 mg/dL (ref 0.0–1.0)

## 2013-11-16 LAB — COMPREHENSIVE METABOLIC PANEL
ALBUMIN: 4 g/dL (ref 3.5–5.2)
ALT: 71 U/L — AB (ref 0–53)
AST: 38 U/L — AB (ref 0–37)
Alkaline Phosphatase: 54 U/L (ref 39–117)
BUN: 10 mg/dL (ref 6–23)
CO2: 24 meq/L (ref 19–32)
Calcium: 9.1 mg/dL (ref 8.4–10.5)
Chloride: 104 mEq/L (ref 96–112)
Creatinine, Ser: 1.05 mg/dL (ref 0.50–1.35)
GFR calc Af Amer: >90 mL/min (ref >90)
GFR calc non Af Amer: 89 mL/min — ABNORMAL LOW (ref >90)
Glucose, Bld: 108 mg/dL — ABNORMAL HIGH (ref 70–99)
Potassium: 3.2 mEq/L — ABNORMAL LOW (ref 3.7–5.3)
SODIUM: 141 meq/L (ref 137–147)
Total Bilirubin: 0.7 mg/dL (ref 0.3–1.2)
Total Protein: 8 g/dL (ref 6.0–8.3)

## 2013-11-16 LAB — CBC WITH DIFFERENTIAL/PLATELET
Basophils Absolute: 0 10*3/uL (ref 0.0–0.1)
Basophils Relative: 0 % (ref 0–1)
EOS ABS: 0.1 10*3/uL (ref 0.0–0.7)
EOS PCT: 2 % (ref 0–5)
HCT: 43 % (ref 39.0–52.0)
HEMOGLOBIN: 14.7 g/dL (ref 13.0–17.0)
Lymphocytes Relative: 38 % (ref 12–46)
Lymphs Abs: 1.5 10*3/uL (ref 0.7–4.0)
MCH: 32.2 pg (ref 26.0–34.0)
MCHC: 34.2 g/dL (ref 30.0–36.0)
MCV: 94.1 fL (ref 78.0–100.0)
MONOS PCT: 12 % (ref 3–12)
Monocytes Absolute: 0.5 10*3/uL (ref 0.1–1.0)
NEUTROS PCT: 48 % (ref 43–77)
Neutro Abs: 1.9 10*3/uL (ref 1.7–7.7)
Platelets: 164 10*3/uL (ref 150–400)
RBC: 4.57 MIL/uL (ref 4.22–5.81)
RDW: 14.3 % (ref 11.5–15.5)
WBC: 4 10*3/uL (ref 4.0–10.5)

## 2013-11-16 MED ORDER — MORPHINE SULFATE 4 MG/ML IJ SOLN
6.0000 mg | Freq: Once | INTRAMUSCULAR | Status: AC
  Administered 2013-11-16: 6 mg via INTRAVENOUS
  Filled 2013-11-16: qty 2

## 2013-11-16 MED ORDER — PROMETHAZINE HCL 25 MG PO TABS: 25.0000 mg | ORAL_TABLET | Freq: Three times a day (TID) | ORAL | Status: DC | PRN

## 2013-11-16 MED ORDER — ONDANSETRON HCL 4 MG/2ML IJ SOLN
4.0000 mg | INTRAMUSCULAR | Status: AC
  Administered 2013-11-16: 4 mg via INTRAVENOUS
  Filled 2013-11-16: qty 2

## 2013-11-16 MED ORDER — POTASSIUM CHLORIDE 10 MEQ/100ML IV SOLN
10.0000 meq | Freq: Once | INTRAVENOUS | Status: AC
  Administered 2013-11-16: 10 meq via INTRAVENOUS
  Filled 2013-11-16: qty 100

## 2013-11-16 MED ORDER — IOHEXOL 300 MG/ML  SOLN
50.0000 mL | Freq: Once | INTRAMUSCULAR | Status: AC | PRN
  Administered 2013-11-16: 50 mL via ORAL

## 2013-11-16 MED ORDER — SODIUM CHLORIDE 0.9 % IV BOLUS (SEPSIS)
1000.0000 mL | Freq: Once | INTRAVENOUS | Status: AC
  Administered 2013-11-16: 1000 mL via INTRAVENOUS

## 2013-11-16 MED ORDER — MORPHINE SULFATE 2 MG/ML IJ SOLN
2.0000 mg | Freq: Once | INTRAMUSCULAR | Status: AC
  Administered 2013-11-16: 2 mg via INTRAVENOUS
  Filled 2013-11-16: qty 1

## 2013-11-16 MED ORDER — IOHEXOL 300 MG/ML  SOLN
100.0000 mL | Freq: Once | INTRAMUSCULAR | Status: AC | PRN
Start: 2013-11-16 — End: 2013-11-16
  Administered 2013-11-16: 100 mL via INTRAVENOUS

## 2013-11-16 MED ORDER — OXYCODONE-ACETAMINOPHEN 5-325 MG PO TABS: 1.0000 | ORAL_TABLET | Freq: Four times a day (QID) | ORAL | Status: DC | PRN

## 2013-11-16 MED ORDER — HYDROMORPHONE HCL PF 1 MG/ML IJ SOLN
1.0000 mg | Freq: Once | INTRAMUSCULAR | Status: AC
  Administered 2013-11-16: 1 mg via INTRAVENOUS
  Filled 2013-11-16: qty 1

## 2013-11-16 NOTE — ED Provider Notes (Signed)
Medical screening examination/treatment/procedure(s) were performed by non-physician practitioner and as supervising physician I was immediately available for consultation/collaboration.    Celene KrasJon R Martice Doty, MD 11/16/13 2132

## 2013-11-16 NOTE — ED Provider Notes (Signed)
  Physical Exam  BP 166/108  Pulse 66  Temp(Src) 97.7 F (36.5 C) (Oral)  Resp 16  Ht 5\' 10"  (1.778 m)  Wt 205 lb (92.987 kg)  BMI 29.41 kg/m2  SpO2 96%  Physical Exam  ED Course  Procedures   Patient is feeling better following the law that and I spoke with Dr. Oscar Laaplan from GI who states, that he can follow up with the patient in the office.  We reviewed the patient's lab tests and CT scan findings     Carlyle DollyChristopher W Zaiyden Strozier, PA-C 11/16/13 2128

## 2013-11-16 NOTE — ED Provider Notes (Signed)
   Medical screening examination/treatment/procedure(s) were conducted as a shared visit with non-physician practitioner(s) or resident and myself. I personally evaluated the patient during the encounter and agree with the findings and plan unless otherwise indicated.  I have personally reviewed any xrays and/ or EKG's with the provider and I agree with interpretation.  Worsening right lower abdo pain with diarrhea/ vomiting. Exam right lower and mid abdo pain, soft, no guarding, mild dry mm, mild tachycardia. Plan for labs, fluids and CT abdo to look for appy vs colitis vs other.  Signed out to fup CT results.  Diarrhea, Dehydration, Right abdo pain     Gabriel SkeensJoshua M Ahmed Inniss, MD 11/16/13 1651
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
## 2013-11-16 NOTE — ED Notes (Signed)
Patient states he was seen yesterday for abdominal pain, diarrhea, and emesis. Patient was told that he could come back today for an ultrasound. Patient states his abdominal pain has increased and becomes nauseated after nausea medicine wears off. Patient states the diarrhea has increased. Patient denies any blood in his urine.

## 2013-11-16 NOTE — Discharge Instructions (Signed)
Return here as needed.  Followup with the, GI Dr. provided °

## 2013-11-17 ENCOUNTER — Encounter: Payer: Self-pay | Admitting: Gastroenterology

## 2013-11-17 ENCOUNTER — Telehealth: Payer: Self-pay | Admitting: Gastroenterology

## 2013-11-17 ENCOUNTER — Ambulatory Visit (INDEPENDENT_AMBULATORY_CARE_PROVIDER_SITE_OTHER): Payer: 59 | Admitting: Gastroenterology

## 2013-11-17 VITALS — BP 130/80 | HR 66 | Ht 70.0 in | Wt 209.2 lb

## 2013-11-17 DIAGNOSIS — R197 Diarrhea, unspecified: Secondary | ICD-10-CM

## 2013-11-17 DIAGNOSIS — R112 Nausea with vomiting, unspecified: Secondary | ICD-10-CM

## 2013-11-17 DIAGNOSIS — K219 Gastro-esophageal reflux disease without esophagitis: Secondary | ICD-10-CM

## 2013-11-17 DIAGNOSIS — R1031 Right lower quadrant pain: Secondary | ICD-10-CM

## 2013-11-17 DIAGNOSIS — R935 Abnormal findings on diagnostic imaging of other abdominal regions, including retroperitoneum: Secondary | ICD-10-CM

## 2013-11-17 MED ORDER — PROMETHAZINE HCL 25 MG PO TABS: 25.0000 mg | ORAL_TABLET | Freq: Three times a day (TID) | ORAL | Status: DC | PRN

## 2013-11-17 MED ORDER — OXYCODONE-ACETAMINOPHEN 5-325 MG PO TABS: 1.0000 | ORAL_TABLET | Freq: Four times a day (QID) | ORAL | Status: DC | PRN

## 2013-11-17 MED ORDER — MOVIPREP 100 G PO SOLR: 1.0000 | ORAL | Status: DC

## 2013-11-17 MED ORDER — PANTOPRAZOLE SODIUM 40 MG PO TBEC: 40.0000 mg | DELAYED_RELEASE_TABLET | Freq: Every day | ORAL | Status: DC
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
## 2013-11-17 NOTE — Patient Instructions (Signed)
You have been scheduled for an endoscopy and colonoscopy with propofol. Please follow the written instructions given to you at your visit today. Please pick up your prep at the pharmacy within the next 1-3 days. [facility] [address] We also sent: 1; Pantoprazole Sodium 2. Phenergan 3. Oxycodone ( I have given you the prescription to hand deliver to the pharmacy)  If you use inhalers (even only as needed), please bring them with you on the day of your procedure. We have given you EmmiManager instructions to go on line and view a colonoscopy/ Endoscopy.

## 2013-11-18 ENCOUNTER — Telehealth: Payer: Self-pay | Admitting: Gastroenterology

## 2013-11-18 ENCOUNTER — Encounter: Payer: Self-pay | Admitting: Internal Medicine

## 2013-11-18 NOTE — Telephone Encounter (Signed)
Patient is asking for a note to because he was out of work today and will be on light duty next week. Is this ok?

## 2013-11-19 ENCOUNTER — Encounter: Payer: Self-pay | Admitting: *Deleted

## 2013-11-19 ENCOUNTER — Other Ambulatory Visit: Payer: 59

## 2013-11-19 NOTE — Telephone Encounter (Signed)
Spoke with patient and he will pick up letter. Letter up front for pick up.

## 2013-11-19 NOTE — Telephone Encounter (Signed)
Letter ready. Left a message for patient to call me.

## 2013-11-19 NOTE — Telephone Encounter (Signed)
We already gave him one for light duty next week I believe, but it is ok to give him one for work yesterday as well.  Gabriel Garcia

## 2013-11-22 ENCOUNTER — Telehealth: Payer: Self-pay | Admitting: Gastroenterology

## 2013-11-22 LAB — GASTROINTESTINAL PATHOGEN PANEL PCR
C. DIFFICILE TOX A/B, PCR: NEGATIVE
Campylobacter, PCR: NEGATIVE
Cryptosporidium, PCR: NEGATIVE
E COLI (STEC) STX1/STX2, PCR: NEGATIVE
E coli (ETEC) LT/ST PCR: NEGATIVE
E coli 0157, PCR: NEGATIVE
Giardia lamblia, PCR: NEGATIVE
Norovirus, PCR: NEGATIVE
ROTAVIRUS, PCR: POSITIVE — AB
SALMONELLA, PCR: NEGATIVE
Shigella, PCR: NEGATIVE

## 2013-11-23 ENCOUNTER — Other Ambulatory Visit: Payer: Self-pay | Admitting: *Deleted

## 2013-11-23 MED ORDER — OXYCODONE-ACETAMINOPHEN 5-325 MG PO TABS: 1.0000 | ORAL_TABLET | Freq: Four times a day (QID) | ORAL | Status: DC | PRN

## 2013-11-24 ENCOUNTER — Encounter: Payer: Self-pay | Admitting: Gastroenterology

## 2013-11-24 DIAGNOSIS — R112 Nausea with vomiting, unspecified: Secondary | ICD-10-CM | POA: Insufficient documentation

## 2013-11-24 DIAGNOSIS — R197 Diarrhea, unspecified: Secondary | ICD-10-CM | POA: Insufficient documentation

## 2013-11-24 DIAGNOSIS — R935 Abnormal findings on diagnostic imaging of other abdominal regions, including retroperitoneum: Secondary | ICD-10-CM | POA: Insufficient documentation

## 2013-11-24 DIAGNOSIS — R1031 Right lower quadrant pain: Secondary | ICD-10-CM | POA: Insufficient documentation

## 2013-11-24 DIAGNOSIS — K219 Gastro-esophageal reflux disease without esophagitis: Secondary | ICD-10-CM | POA: Insufficient documentation

## 2013-11-24 NOTE — Progress Notes (Signed)
Reviewed and agree. If not improved in 1 week, would start empirically Flagyl 250 tid x10 days and Cipro 250 mg po bid x 10 days.

## 2013-11-24 NOTE — Progress Notes (Signed)
11/24/2013 Gabriel Garcia [id number] 1976/01/12   HISTORY OF PRESENT ILLNESS:  This is a 38 year old male who presents to our office today for follow-up from his ED visit yesterday.  He had sudden onset of diarrhea, abdominal pain, nausea, and vomiting last Thursday.  He was not much better so he went to the ED yesterday.  CBC was normal.  CMP showed a slightly low potassium at 3.2 and elevated transaminases (patient has Hepatitis C).  CT scan of the abdomen and pelvis with contrast showed the following:  "IMPRESSION:  1. Terminal ileitis. Findings are most consistent with Crohn's  disease. Other etiologies of ileitis or ileal wall thickening cannot  be excluded.  2. Associated mesenteric adenitis.  3. Small sliding hiatal hernia with mild thickening of the distal  esophagus. Mild esophagitis cannot be excluded."  They gave him IVF's, pain medication, and phenergan and told him to follow-up in our office.  He says that the diarrhea has been getting better and he is having one or two formed bowel movements a day now.  Still has a lot of nausea (has vomiting as well if he does not take the phenergan) and abdominal pain, mostly in RLQ.  He also complains of a lot of reflux.  Says that he feels terrible.  Never had similar symptoms in the past but says that his stomach has "always bothered him" but denies bowel issues.  Maybe has some intermittent abdominal pain.  No rectal bleeding.    Past Medical History  Diagnosis Date  . Hypertension   . Hepatitis C   . 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 Eye surgery Right   . Foot surgery Right     reports that he has never smoked. He has never used smokeless tobacco. He reports that he drinks alcohol. He reports that he does not use illicit drugs. family history includes Heart failure in his father; Hypertension in his mother; Rheum arthritis in his mother. There is no history of  Colon cancer. No Known Allergies    Outpatient Encounter Prescriptions as of 11/17/2013  Medication Sig  . ibuprofen (ADVIL,MOTRIN) 200 MG tablet Take 400 mg by mouth every 6 (six) hours as needed (pain).  [name] lisinopril (PRINIVIL,ZESTRIL) 10 MG tablet Take 10 mg by mouth 2 (two) times daily.  . metoprolol tartrate (LOPRESSOR) 25 MG tablet Take 25 mg by mouth 2 (two) times daily.  . promethazine (PHENERGAN) 25 MG tablet Take 1 tablet (25 mg total) by mouth every 8 (eight) hours as needed for nausea or vomiting.  . [DISCONTINUED] HYDROcodone-acetaminophen (NORCO) 5-325 MG per tablet Take 2 tablets by mouth every 4 (four) hours as needed.  . [DISCONTINUED] metoCLOPramide (REGLAN) 10 MG tablet Take 1 tablet (10 mg total) by mouth every 6 (six) hours as needed for nausea (nausea/headache).  . [DISCONTINUED] oxyCODONE-acetaminophen (PERCOCET/ROXICET) 5-325 MG per tablet Take 1 tablet by mouth every 6 (six) hours as needed for severe pain.  . [DISCONTINUED] oxyCODONE-acetaminophen (PERCOCET/ROXICET) 5-325 MG per tablet Take 1 tablet by mouth every 6 (six) hours as needed for severe pain.  . [DISCONTINUED] promethazine (PHENERGAN) 25 MG tablet Take 1 tablet (25 mg total) by mouth every 8 (eight) hours as needed for nausea or vomiting.  [name] MOVIPREP 100 G SOLR Take 1 kit (200 g total) by mouth as directed.  . pantoprazole (PROTONIX) 40 MG tablet Take 1 tablet (40 mg total) by mouth daily.  . [  DISCONTINUED] ondansetron (ZOFRAN ODT) 4 MG disintegrating tablet 7m ODT q4 hours prn nausea/vomit     REVIEW OF SYSTEMS  : All other systems reviewed and negative except where noted in the History of Present Illness.   PHYSICAL EXAM: BP 130/80  Pulse 66  Ht _0  (1.778 m)  Wt 209 lb 3.2 oz (94.892 kg)  BMI 30.02 kg/m2 General: Well developed white male in no acute distress Head: Normocephalic and atraumatic Eyes:  Sclerae anicteric, conjunctiva pink. Ears: Normal auditory acuity. Lungs: Clear throughout  to auscultation Heart: Regular rate and rhythm Abdomen: Soft, non-distended.  BS present.  RLQ abdominal TTP without R/R/G. Musculoskeletal: Symmetrical with no gross deformities  Skin: No lesions on visible extremities Extremities: No edema  Neurological: Alert oriented x 4, grossly non-focal Psychological:  Alert and cooperative. Normal mood and affect  ASSESSMENT AND PLAN: -Sudden onset of nausea/vomiting, diarrhea, and abdominal pain (RLQ) with CT scan abnormalities showing terminal ileitis and mesenteric adenitis.  Sounds like an infectious process due to the sudden onset of symptoms.  ? Crohn's.  Will give him some more medication for nausea (phenergan), and a small amount of pain medication (oxycodone).  Advised him to keep hydrated and slowly increase diet as tolerated to low fiber diet.  Check GI pathogen panel as well.  Will give him a note for work as well. -Reflux with small sliding hiatal hernia with mild thickening of the distal esophagus on CT scan:  ? GERD or esophagitis from vomiting.  Will place him on pantoprazole 40 mg daily for now. -Hepatitis C:  Will need referral for treatment in the future.  *Will schedule for EGD and colonoscopy for now, but we will have him come back for follow-up and if he is improved then he may not need those studies.

## 2013-11-25 ENCOUNTER — Telehealth: Payer: Self-pay | Admitting: Gastroenterology

## 2013-11-25 ENCOUNTER — Encounter: Payer: Self-pay | Admitting: *Deleted

## 2013-11-25 NOTE — Telephone Encounter (Signed)
OK for note today.  Gabriel Garcia

## 2013-11-25 NOTE — Telephone Encounter (Signed)
Woke up this AM with "shooting pains and diarrhea". Asking for note for work because he did not go to work today. He states he does feel he can work without restrictions next week. States he plans to work tomorrow. States his company started 5 day work week this week. Encouraged patient to move OV with Dr. Juanda ChanceBrodie to next week since he is still having problems. He states he cannot come next week because he has a lot going on and he has work also. Please, advise if he can have a note for being out of work today.

## 2013-11-26 NOTE — Telephone Encounter (Signed)
Patient left a message stating for me to leave him a message at his cell number. Left a message at his cell that his letter is ready and will be at front desk for pick up.

## 2013-11-26 NOTE — Telephone Encounter (Signed)
Letter ready for patient. Left a message for patient call.

## 2013-12-07 ENCOUNTER — Ambulatory Visit: Payer: Self-pay | Admitting: Internal Medicine

## 2013-12-30 ENCOUNTER — Encounter: Payer: Self-pay | Admitting: Internal Medicine

## 2014-02-01 ENCOUNTER — Telehealth: Payer: Self-pay | Admitting: Internal Medicine

## 2014-02-01 NOTE — Telephone Encounter (Signed)
Pt not allowed to reschedule, he is a high risk for canceling again. He needs OV with me first. Please charge for canceling late.

## 2014-02-02 ENCOUNTER — Encounter: Payer: 59 | Admitting: Internal Medicine

## 2014-02-11 NOTE — Telephone Encounter (Signed)
See Previous Encounter

## 2014-03-29 ENCOUNTER — Telehealth: Payer: Self-pay | Admitting: *Deleted

## 2014-03-29 NOTE — Telephone Encounter (Signed)
Message copied by Daphine DeutscherMILLER, REGINA N on Tue Mar 29, 2014  8:24 AM ------      Message from: [name], [name]      Created: Mon Mar 28, 2014 10:20 PM      Regarding: please call pt       [name], please call this pt to make sure he is coming for EGD/colon  For 6/17. He was not supposed to be rescheduled without an office visit after he canceled on 02/01/2014- " could not get off work". He was supposed to be charged late cancellation fee. Please let me know if he is coming and if not, try to reschedule somebody from the future EGD's if possible. Thanx DB. ------

## 2014-03-29 NOTE — Telephone Encounter (Signed)
Left a message on home and cell for patient to call me.

## 2014-03-30 ENCOUNTER — Encounter: Payer: Self-pay | Admitting: *Deleted

## 2014-03-30 ENCOUNTER — Encounter: Payer: 59 | Admitting: Internal Medicine

## 2014-03-31 ENCOUNTER — Telehealth: Payer: Self-pay | Admitting: Internal Medicine

## 2014-03-31 NOTE — Telephone Encounter (Signed)
Dismissal Letter sent by Certified Mail 04/01/2014  Received the Return Receipt showing someone picked up the Dismissal 04/05/2014

## 2014-07-06 ENCOUNTER — Encounter (HOSPITAL_COMMUNITY): Payer: Self-pay | Admitting: Emergency Medicine

## 2014-07-06 ENCOUNTER — Emergency Department (HOSPITAL_COMMUNITY)
Admission: EM | Admit: 2014-07-06 | Discharge: 2014-07-06 | Disposition: A | Discharge status: Home / Self Care | Payer: 59 | Attending: Emergency Medicine | Admitting: Emergency Medicine

## 2014-07-06 DIAGNOSIS — Z791 Long term (current) use of non-steroidal anti-inflammatories (NSAID): Secondary | ICD-10-CM | POA: Insufficient documentation

## 2014-07-06 DIAGNOSIS — R1012 Left upper quadrant pain: Secondary | ICD-10-CM | POA: Insufficient documentation

## 2014-07-06 DIAGNOSIS — I10 Essential (primary) hypertension: Secondary | ICD-10-CM

## 2014-07-06 DIAGNOSIS — R110 Nausea: Secondary | ICD-10-CM | POA: Insufficient documentation

## 2014-07-06 DIAGNOSIS — Z8719 Personal history of other diseases of the digestive system: Secondary | ICD-10-CM | POA: Insufficient documentation

## 2014-07-06 DIAGNOSIS — Z8619 Personal history of other infectious and parasitic diseases: Secondary | ICD-10-CM | POA: Insufficient documentation

## 2014-07-06 DIAGNOSIS — Z862 Personal history of diseases of the blood and blood-forming organs and certain disorders involving the immune mechanism: Secondary | ICD-10-CM | POA: Insufficient documentation

## 2014-07-06 DIAGNOSIS — H548 Legal blindness, as defined in USA: Secondary | ICD-10-CM | POA: Insufficient documentation

## 2014-07-06 LAB — COMPREHENSIVE METABOLIC PANEL
ALT: 164 U/L — ABNORMAL HIGH (ref 0–53)
AST: 91 U/L — ABNORMAL HIGH (ref 0–37)
Albumin: 4 g/dL (ref 3.5–5.2)
Alkaline Phosphatase: 51 U/L (ref 39–117)
Anion gap: 12 (ref 5–15)
BUN: 11 mg/dL (ref 6–23)
CALCIUM: 9.3 mg/dL (ref 8.4–10.5)
CO2: 25 meq/L (ref 19–32)
CREATININE: 1.22 mg/dL (ref 0.50–1.35)
Chloride: 104 mEq/L (ref 96–112)
GFR, EST AFRICAN AMERICAN: 86 mL/min — AB (ref >90)
GFR, EST NON AFRICAN AMERICAN: 74 mL/min — AB (ref >90)
GLUCOSE: 92 mg/dL (ref 70–99)
Potassium: 3.9 mEq/L (ref 3.7–5.3)
Sodium: 141 mEq/L (ref 137–147)
TOTAL PROTEIN: 7.9 g/dL (ref 6.0–8.3)
Total Bilirubin: 0.4 mg/dL (ref 0.3–1.2)

## 2014-07-06 LAB — CBC WITH DIFFERENTIAL/PLATELET
Basophils Absolute: 0 10*3/uL (ref 0.0–0.1)
Basophils Relative: 0 % (ref 0–1)
EOS ABS: 0.2 10*3/uL (ref 0.0–0.7)
EOS PCT: 3 % (ref 0–5)
HEMATOCRIT: 41.9 % (ref 39.0–52.0)
Hemoglobin: 13.8 g/dL (ref 13.0–17.0)
LYMPHS ABS: 2.8 10*3/uL (ref 0.7–4.0)
LYMPHS PCT: 46 % (ref 12–46)
MCH: 31.8 pg (ref 26.0–34.0)
MCHC: 32.9 g/dL (ref 30.0–36.0)
MCV: 96.5 fL (ref 78.0–100.0)
MONO ABS: 0.4 10*3/uL (ref 0.1–1.0)
MONOS PCT: 6 % (ref 3–12)
Neutro Abs: 2.7 10*3/uL (ref 1.7–7.7)
Neutrophils Relative %: 45 % (ref 43–77)
Platelets: 227 10*3/uL (ref 150–400)
RBC: 4.34 MIL/uL (ref 4.22–5.81)
RDW: 14.3 % (ref 11.5–15.5)
WBC: 6.1 10*3/uL (ref 4.0–10.5)

## 2014-07-06 LAB — URINALYSIS, ROUTINE W REFLEX MICROSCOPIC
Bilirubin Urine: NEGATIVE
GLUCOSE, UA: NEGATIVE mg/dL
HGB URINE DIPSTICK: NEGATIVE
Ketones, ur: NEGATIVE mg/dL
LEUKOCYTES UA: NEGATIVE
Nitrite: NEGATIVE
PROTEIN: NEGATIVE mg/dL
Specific Gravity, Urine: 1.028 (ref 1.005–1.030)
Urobilinogen, UA: 0.2 mg/dL (ref 0.0–1.0)
pH: 5.5 (ref 5.0–8.0)

## 2014-07-06 LAB — LIPASE, BLOOD: LIPASE: 28 U/L (ref 11–59)

## 2014-07-06 MED ORDER — OXYCODONE HCL 5 MG PO TABS: 5.0000 mg | ORAL_TABLET | ORAL | Status: DC | PRN

## 2014-07-06 MED ORDER — METOPROLOL TARTRATE 25 MG PO TABS: 25.0000 mg | ORAL_TABLET | Freq: Two times a day (BID) | ORAL | Status: DC

## 2014-07-06 MED ORDER — PANTOPRAZOLE SODIUM 40 MG PO TBEC: 40.0000 mg | DELAYED_RELEASE_TABLET | Freq: Every day | ORAL | Status: DC

## 2014-07-06 MED ORDER — LISINOPRIL 10 MG PO TABS: 10.0000 mg | ORAL_TABLET | Freq: Two times a day (BID) | ORAL | Status: DC

## 2014-07-06 NOTE — ED Provider Notes (Signed)
CSN: [id number]     Arrival date & time 07/06/14  1541 History   First MD Initiated Contact with Patient 07/06/14 1800     Chief Complaint  Patient presents with  . Abdominal Pain     (Consider location/radiation/quality/duration/timing/severity/associated sxs/prior Treatment) HPI 38 year old male presents with left upper quadrant pain for the past one week. States initially was intermittent but is now become constant over last 48 hours. Describes as a cramping type pain. He rates the pain as severe at this time. Felt nauseous when the pain struck him hard last night and became sweaty. He denies any radiation of the pain. No back pain. No dysuria or hematuria. He originally thought it might be casted Gas-X without any relief. He also gave himself ex-lax and went to the bathroom but did not improve his pain. Denies any blood in his stools. Has not had any fevers.  Past Medical History  Diagnosis Date  . Hypertension   . Hepatitis C   . 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 Terminal ileitis   . Mesenteric adenitis   . Sliding hiatal hernia    Past Surgical History  Procedure Laterality Date  . Eye surgery Right   . Foot surgery Right    Family History  Problem Relation Age of Onset  . Rheum arthritis Mother   . Hypertension Mother   . Heart failure Father   . Colon cancer Neg Hx    History  Substance Use Topics  . Smoking status: Never Smoker   . Smokeless tobacco: Never Used  . Alcohol Use: Yes     Comment: occasionally    Review of Systems  Constitutional: Negative for fever.  Gastrointestinal: Positive for nausea and abdominal pain. Negative for diarrhea, constipation and blood in stool.  Genitourinary: Negative for dysuria and hematuria.  Musculoskeletal: Negative for back pain.  All other systems reviewed and are negative.     Allergies  Review of patient's allergies indicates no known allergies.  Home Medications   Prior to Admission  medications   Medication Sig Start Date End Date Taking? Authorizing Provider  ibuprofen (ADVIL,MOTRIN) 200 MG tablet Take 800 mg by mouth every 6 (six) hours as needed (pain).    Yes Historical Provider, MD  lisinopril (PRINIVIL,ZESTRIL) 10 MG tablet Take 1 tablet (10 mg total) by mouth 2 (two) times daily. 07/06/14   Audree Camel, MD  metoprolol tartrate (LOPRESSOR) 25 MG tablet Take 1 tablet (25 mg total) by mouth 2 (two) times daily. 07/06/14   Audree Camel, MD  oxyCODONE (ROXICODONE) 5 MG immediate release tablet Take 1 tablet (5 mg total) by mouth every 4 (four) hours as needed for severe pain. 07/06/14   Audree Camel, MD  pantoprazole (PROTONIX) 40 MG tablet Take 1 tablet (40 mg total) by mouth daily. 07/06/14   Audree Camel, MD   BP 164/110  Pulse 69  Temp(Src) 98.1 F (36.7 C) (Oral)  Resp 20  SpO2 100% Physical Exam  Nursing note and vitals reviewed. Constitutional: He is oriented to person, place, and time. He appears well-developed and well-nourished. No distress.  HENT:  Head: Normocephalic and atraumatic.  Right Ear: External ear normal.  Left Ear: External ear normal.  Nose: Nose normal.  Eyes: Right eye exhibits no discharge. Left eye exhibits no discharge.  Neck: Neck supple.  Cardiovascular: Normal rate, regular rhythm, normal heart sounds and intact distal pulses.   Pulmonary/Chest: Effort normal.  Abdominal: Soft.  He exhibits no distension. There is tenderness in the left upper quadrant.  Neurological: He is alert and oriented to person, place, and time.  Skin: Skin is warm and dry. He is not diaphoretic.    ED Course  Procedures (including critical care time) Labs Review Labs Reviewed  COMPREHENSIVE METABOLIC PANEL - Abnormal; Notable for the following:    AST 91 (*)    ALT 164 (*)    GFR calc non Af Amer 74 (*)    GFR calc Af Amer 86 (*)    All other components within normal limits  CBC WITH DIFFERENTIAL  LIPASE, BLOOD  URINALYSIS, ROUTINE  W REFLEX MICROSCOPIC    Imaging Review No results found.   EKG Interpretation None      MDM   Final diagnoses:  Left upper quadrant pain  Essential hypertension    Patient with an unremarkable abdominal exam with mild left upper quadrant tenderness. He does take ibuprofen daily, which could be contributing to a gastritis. Will treat with PPI, pain control, and recommend follow up with GI as was previously recommended.    Audree Camel, MD 07/07/14 [phone]

## 2014-07-06 NOTE — ED Notes (Signed)
Pt reports LUQ pain x 1 week.  States he's been passing gas and having regular BM without relief.  Pt has a GI MD that he used to see but recently lost his job and no insurance so he stopped going.  States he was supposed to have a colonoscopy to r/o crohn's disease.  Denies nv at this time.

## 2014-07-06 NOTE — Discharge Instructions (Signed)
Abdominal (belly) pain can be caused by many things. Your caregiver performed an examination and possibly ordered blood/urine tests and imaging (CT scan, x-rays, ultrasound). Many cases can be observed and treated at home after initial evaluation in the emergency department. Even though you are being discharged home, abdominal pain can be unpredictable. Therefore, you need a repeated exam if your pain does not resolve, returns, or worsens. Most patients with abdominal pain don't have to be admitted to the hospital or have surgery, but serious problems like appendicitis and gallbladder attacks can start out as nonspecific pain. Many abdominal conditions cannot be diagnosed in one visit, so follow-up evaluations are very important. °SEEK IMMEDIATE MEDICAL ATTENTION IF: °The pain does not go away or becomes severe.  °A temperature above 101 develops.  °Repeated vomiting occurs (multiple episodes).  °The pain becomes localized to portions of the abdomen. The right side could possibly be appendicitis. In an adult, the left lower portion of the abdomen could be colitis or diverticulitis.  °Blood is being passed in stools or vomit (bright red or black tarry stools).  °Return also if you develop chest pain, difficulty breathing, dizziness or fainting, or become confused, poorly responsive, or inconsolable (young children). ° ° °Abdominal Pain °Many things can cause abdominal pain. Usually, abdominal pain is not caused by a disease and will improve without treatment. It can often be observed and treated at home. Your health care provider will do a physical exam and possibly order blood tests and X-rays to help determine the seriousness of your pain. However, in many cases, more time must pass before a clear cause of the pain can be found. Before that point, your health care provider may not know if you need more testing or further treatment. °HOME CARE INSTRUCTIONS  °Monitor your abdominal pain for any changes. The following  actions may help to alleviate any discomfort you are experiencing: °· Only take over-the-counter or prescription medicines as directed by your health care provider. °· Do not take laxatives unless directed to do so by your health care provider. °· Try a clear liquid diet (broth, tea, or water) as directed by your health care provider. Slowly move to a bland diet as tolerated. °SEEK MEDICAL CARE IF: °· You have unexplained abdominal pain. °· You have abdominal pain associated with nausea or diarrhea. °· You have pain when you urinate or have a bowel movement. °· You experience abdominal pain that wakes you in the night. °· You have abdominal pain that is worsened or improved by eating food. °· You have abdominal pain that is worsened with eating fatty foods. °· You have a fever. °SEEK IMMEDIATE MEDICAL CARE IF:  °· Your pain does not go away within 2 hours. °· You keep throwing up (vomiting). °· Your pain is felt only in portions of the abdomen, such as the right side or the left lower portion of the abdomen. °· You pass bloody or black tarry stools. °MAKE SURE YOU: °· Understand these instructions.   °· Will watch your condition.   °· Will get help right away if you are not doing well or get worse.   °Document Released: 07/10/2005 Document Revised: 10/05/2013 Document Reviewed: 06/09/2013 °[employer]® Patient Information ©2015 [employer]. This information is not intended to replace advice given to you by your health care provider. Make sure you discuss any questions you have with your health care provider. ° °

## 2014-07-06 NOTE — ED Notes (Signed)
Pt reports left sided abdominal pain x1 weeks. Last bowel movement today. Reports nausea. Denies vomiting. Hx of Hep C. No ETOH use in last week.

## 2014-10-06 ENCOUNTER — Inpatient Hospital Stay (HOSPITAL_COMMUNITY)
Admission: EM | Admit: 2014-10-06 | Discharge: 2014-10-07 | DRG: 379 | Discharge status: Against Medical Advice | Payer: 59 | Attending: Internal Medicine | Admitting: Internal Medicine

## 2014-10-06 ENCOUNTER — Encounter (HOSPITAL_COMMUNITY): Payer: Self-pay

## 2014-10-06 DIAGNOSIS — Z8261 Family history of arthritis: Secondary | ICD-10-CM

## 2014-10-06 DIAGNOSIS — T39395A Adverse effect of other nonsteroidal anti-inflammatory drugs [NSAID], initial encounter: Secondary | ICD-10-CM | POA: Diagnosis present

## 2014-10-06 DIAGNOSIS — I10 Essential (primary) hypertension: Secondary | ICD-10-CM

## 2014-10-06 DIAGNOSIS — K922 Gastrointestinal hemorrhage, unspecified: Principal | ICD-10-CM | POA: Diagnosis present

## 2014-10-06 DIAGNOSIS — Z8249 Family history of ischemic heart disease and other diseases of the circulatory system: Secondary | ICD-10-CM

## 2014-10-06 DIAGNOSIS — N289 Disorder of kidney and ureter, unspecified: Secondary | ICD-10-CM | POA: Diagnosis present

## 2014-10-06 DIAGNOSIS — I959 Hypotension, unspecified: Secondary | ICD-10-CM

## 2014-10-06 DIAGNOSIS — H548 Legal blindness, as defined in USA: Secondary | ICD-10-CM | POA: Diagnosis present

## 2014-10-06 DIAGNOSIS — Z5329 Procedure and treatment not carried out because of patient's decision for other reasons: Secondary | ICD-10-CM | POA: Diagnosis present

## 2014-10-06 HISTORY — DX: Other psychoactive substance abuse, uncomplicated: F19.10

## 2014-10-06 LAB — COMPREHENSIVE METABOLIC PANEL
ALT: 148 U/L — ABNORMAL HIGH (ref 0–53)
AST: 97 U/L — ABNORMAL HIGH (ref 0–37)
Albumin: 3.5 g/dL (ref 3.5–5.2)
Alkaline Phosphatase: 36 U/L — ABNORMAL LOW (ref 39–117)
Anion gap: 5 (ref 5–15)
BUN: 54 mg/dL — ABNORMAL HIGH (ref 6–23)
CALCIUM: 8.6 mg/dL (ref 8.4–10.5)
CO2: 23 mmol/L (ref 19–32)
Chloride: 113 mEq/L — ABNORMAL HIGH (ref 96–112)
Creatinine, Ser: 1.36 mg/dL — ABNORMAL HIGH (ref 0.50–1.35)
GFR, EST AFRICAN AMERICAN: 75 mL/min — AB (ref >90)
GFR, EST NON AFRICAN AMERICAN: 65 mL/min — AB (ref >90)
GLUCOSE: 120 mg/dL — AB (ref 70–99)
Potassium: 4.4 mmol/L (ref 3.5–5.1)
Sodium: 141 mmol/L (ref 135–145)
Total Bilirubin: 0.8 mg/dL (ref 0.3–1.2)
Total Protein: 6.3 g/dL (ref 6.0–8.3)

## 2014-10-06 LAB — CBC WITH DIFFERENTIAL/PLATELET
BASOS PCT: 0 % (ref 0–1)
Basophils Absolute: 0 10*3/uL (ref 0.0–0.1)
Eosinophils Absolute: 0 10*3/uL (ref 0.0–0.7)
Eosinophils Relative: 0 % (ref 0–5)
HCT: 27.1 % — ABNORMAL LOW (ref 39.0–52.0)
Hemoglobin: 8.6 g/dL — ABNORMAL LOW (ref 13.0–17.0)
Lymphocytes Relative: 32 % (ref 12–46)
Lymphs Abs: 2.2 10*3/uL (ref 0.7–4.0)
MCH: 29.7 pg (ref 26.0–34.0)
MCHC: 31.7 g/dL (ref 30.0–36.0)
MCV: 93.4 fL (ref 78.0–100.0)
Monocytes Absolute: 0.4 10*3/uL (ref 0.1–1.0)
Monocytes Relative: 6 % (ref 3–12)
NEUTROS ABS: 4.3 10*3/uL (ref 1.7–7.7)
NEUTROS PCT: 62 % (ref 43–77)
Platelets: 266 10*3/uL (ref 150–400)
RBC: 2.9 MIL/uL — ABNORMAL LOW (ref 4.22–5.81)
RDW: 14.8 % (ref 11.5–15.5)
WBC: 7 10*3/uL (ref 4.0–10.5)

## 2014-10-06 LAB — POC OCCULT BLOOD, ED: Fecal Occult Bld: POSITIVE — AB

## 2014-10-06 LAB — ETHANOL

## 2014-10-06 LAB — LIPASE, BLOOD: Lipase: 15 U/L (ref 11–59)

## 2014-10-06 LAB — PREPARE RBC (CROSSMATCH)

## 2014-10-06 LAB — ABO/RH: ABO/RH(D): A POS

## 2014-10-06 MED ORDER — DOCUSATE SODIUM 100 MG PO CAPS
100.0000 mg | ORAL_CAPSULE | Freq: Two times a day (BID) | ORAL | Status: DC
Start: 2014-10-06 — End: 2014-10-07
  Filled 2014-10-06 (×3): qty 1

## 2014-10-06 MED ORDER — SODIUM CHLORIDE 0.9 % IV BOLUS (SEPSIS)
1000.0000 mL | Freq: Once | INTRAVENOUS | Status: AC
  Administered 2014-10-06: 1000 mL via INTRAVENOUS

## 2014-10-06 MED ORDER — VITAMIN B-1 100 MG PO TABS
100.0000 mg | ORAL_TABLET | Freq: Every day | ORAL | Status: DC
  Administered 2014-10-06: 100 mg via ORAL
  Filled 2014-10-06 (×2): qty 1

## 2014-10-06 MED ORDER — SODIUM CHLORIDE 0.9 % IV SOLN
10.0000 mL/h | Freq: Once | INTRAVENOUS | Status: AC
  Administered 2014-10-06: 10 mL/h via INTRAVENOUS

## 2014-10-06 MED ORDER — ONDANSETRON HCL 4 MG PO TABS: 4.0000 mg | ORAL_TABLET | Freq: Four times a day (QID) | ORAL | Status: DC | PRN

## 2014-10-06 MED ORDER — SODIUM CHLORIDE 0.9 % IV SOLN
INTRAVENOUS | Status: DC
  Administered 2014-10-06: 20:00:00 via INTRAVENOUS

## 2014-10-06 MED ORDER — OXYCODONE HCL 5 MG PO TABS: 5.0000 mg | ORAL_TABLET | ORAL | Status: DC | PRN

## 2014-10-06 MED ORDER — SODIUM CHLORIDE 0.9 % IJ SOLN
3.0000 mL | Freq: Two times a day (BID) | INTRAMUSCULAR | Status: DC
Start: 2014-10-06 — End: 2014-10-07

## 2014-10-06 MED ORDER — ONDANSETRON HCL 4 MG/2ML IJ SOLN: 4.0000 mg | Freq: Four times a day (QID) | INTRAMUSCULAR | Status: DC | PRN

## 2014-10-06 MED ORDER — ACETAMINOPHEN 650 MG RE SUPP: 650.0000 mg | Freq: Four times a day (QID) | RECTAL | Status: DC | PRN

## 2014-10-06 MED ORDER — ZOLPIDEM TARTRATE 5 MG PO TABS
5.0000 mg | ORAL_TABLET | Freq: Every evening | ORAL | Status: DC | PRN
  Administered 2014-10-06: 5 mg via ORAL
  Filled 2014-10-06: qty 1

## 2014-10-06 MED ORDER — FOLIC ACID 1 MG PO TABS
1.0000 mg | ORAL_TABLET | Freq: Every day | ORAL | Status: DC
  Administered 2014-10-06: 1 mg via ORAL
  Filled 2014-10-06 (×2): qty 1

## 2014-10-06 MED ORDER — ADULT MULTIVITAMIN W/MINERALS CH
1.0000 | ORAL_TABLET | Freq: Every day | ORAL | Status: DC
  Administered 2014-10-06: 1 via ORAL
  Filled 2014-10-06 (×2): qty 1

## 2014-10-06 MED ORDER — ACETAMINOPHEN 325 MG PO TABS: 650.0000 mg | ORAL_TABLET | Freq: Four times a day (QID) | ORAL | Status: DC | PRN

## 2014-10-06 NOTE — ED Notes (Addendum)
Per EMS- Patient c/o dizziness, diarrhea, and vomiting since yesterday. Patient reports that when he stands he hyperventilates, has body aches, and increased dizziness. Patient was given Zofran 4 mg IV prior to arrival to the ED, 12 Lead unremarkable. Patient reports that he last used cocaine 2 days ago.

## 2014-10-06 NOTE — ED Provider Notes (Signed)
CSN: [id number]     Arrival date & time 10/06/14  1451 History   First MD Initiated Contact with Patient 10/06/14 1508     Chief Complaint  Patient presents with  . Dizziness  . Emesis  . Diarrhea     (Consider location/radiation/quality/duration/timing/severity/associated sxs/prior Treatment) HPI  Patient p/w increasing weakness, and new near-syncope with exertion. Sx began in the past days and have worsening particularly in the past 48 hours. No complete LOC, no associated CP or other pain, including abdominal pain. He does note consistent generalized upset stomach and body aches which he takes ibuprofen / pepto-bismal and TUMS.  No substantial changes in his condition with these meds. He acknowledges a Hx of substance abuse. He also has HTN, but has not taken his meds in at least one week.  He is also not taking his PPI. On ROS he notes plenty of black stool recently, and one episode of red blood in stool yesterday. He was previously scheduled for c-scope, but lost his insurance and could not have the exam.   Past Medical History  Diagnosis Date  . Hypertension   . Hepatitis C   . 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 Terminal ileitis   . Mesenteric adenitis   . Sliding hiatal hernia   . Substance abuse     cocaine   Past Surgical History  Procedure Laterality Date  . Eye surgery Right   . Foot surgery Right    Family History  Problem Relation Age of Onset  . Rheum arthritis Mother   . Hypertension Mother   . Heart failure Father   . Colon cancer Neg Hx    History  Substance Use Topics  . Smoking status: Never Smoker   . Smokeless tobacco: Never Used  . Alcohol Use: Yes     Comment: occasionally    Review of Systems  Constitutional:       Per HPI, otherwise negative  HENT:       Per HPI, otherwise negative  Respiratory:       Per HPI, otherwise negative  Cardiovascular:       Per HPI, otherwise negative  Gastrointestinal:  Positive for nausea. Negative for vomiting.  Endocrine:       Negative aside from HPI  Genitourinary:       Neg aside from HPI   Musculoskeletal:       Per HPI, otherwise negative  Skin: Positive for pallor.  Neurological: Positive for dizziness, weakness and light-headedness. Negative for seizures, syncope and headaches.      Allergies  Review of patient's allergies indicates no known allergies.  Home Medications   Prior to Admission medications   Medication Sig Start Date End Date Taking? Authorizing Provider  ibuprofen (ADVIL,MOTRIN) 200 MG tablet Take 800 mg by mouth every 6 (six) hours as needed (pain).     Historical Provider, MD  lisinopril (PRINIVIL,ZESTRIL) 10 MG tablet Take 1 tablet (10 mg total) by mouth 2 (two) times daily. 07/06/14   Audree CamelScott T Goldston, MD  metoprolol tartrate (LOPRESSOR) 25 MG tablet Take 1 tablet (25 mg total) by mouth 2 (two) times daily. 07/06/14   Audree CamelScott T Goldston, MD  oxyCODONE (ROXICODONE) 5 MG immediate release tablet Take 1 tablet (5 mg total) by mouth every 4 (four) hours as needed for severe pain. 07/06/14   Audree CamelScott T Goldston, MD  pantoprazole (PROTONIX) 40 MG tablet Take 1 tablet (40 mg total) by mouth daily. 07/06/14  Audree CamelScott T Goldston, MD   BP 100/65 mmHg  Pulse 112  Temp(Src) 97.8 F (36.6 C) (Oral)  Resp 22  SpO2 100% Physical Exam  Constitutional: He is oriented to person, place, and time. He appears distressed.  Ill appearing, pale M  HENT:  Head: Normocephalic and atraumatic.  Eyes: Conjunctivae and EOM are normal.  Cardiovascular: Regular rhythm.  Tachycardia present.   Pulmonary/Chest: Effort normal. No stridor. No respiratory distress.  Abdominal: He exhibits no distension. There is no tenderness.  Musculoskeletal: He exhibits no edema.  Neurological: He is alert and oriented to person, place, and time.  Skin: Skin is warm. He is diaphoretic.  Psychiatric: He has a normal mood and affect.  Nursing note and vitals  reviewed.   ED Course  Procedures (including critical care time) Labs Review Labs Reviewed  COMPREHENSIVE METABOLIC PANEL - Abnormal; Notable for the following:    Chloride 113 (*)    Glucose, Bld 120 (*)    BUN 54 (*)    Creatinine, Ser 1.36 (*)    AST 97 (*)    ALT 148 (*)    Alkaline Phosphatase 36 (*)    GFR calc non Af Amer 65 (*)    GFR calc Af Amer 75 (*)    All other components within normal limits  CBC WITH DIFFERENTIAL - Abnormal; Notable for the following:    RBC 2.90 (*)    Hemoglobin 8.6 (*)    HCT 27.1 (*)    All other components within normal limits  POC OCCULT BLOOD, ED - Abnormal; Notable for the following:    Fecal Occult Bld POSITIVE (*)    All other components within normal limits  ETHANOL  LIPASE, BLOOD  TYPE AND SCREEN  ABO/RH  PREPARE RBC (CROSSMATCH)      EKG Interpretation   Date/Time:  Thursday October 06 2014 15:32:58 EST Ventricular Rate:  110 PR Interval:  136 QRS Duration: 85 QT Interval:  353 QTC Calculation: 477 R Axis:   63 Text Interpretation:  Sinus tachycardia Borderline prolonged QT interval  Sinus tachycardia T wave abnormality Abnormal ekg Confirmed by Gerhard MunchLOCKWOOD,  Quintell Bonnin  MD ([id number]) on 10/06/2014 4:05:33 PM     Cardiac: 110 - st, abn   BP is abnormal on initial eval. NS started.  On repeat exam the patient remains tachycardic.  He also remains listless in appearance.  Patient states that he has no primary care physician, though one is listed in the medical records. A review of chart demonstrates the patient also has a possible prior diagnosis of by BLS or Crohn's, in addition to hepatitis C. He continues to deny abdominal pain.  Update: Patient's hemoglobin is 8.6, down from 12.  Patient also has kidney insufficiency. Patient received empiric fluid resuscitation, and will now receive blood.  6:28 PM Patient remains tachy w mild hypotension I consented him for blood transfusion. MDM  Patient presents with episodic  lightheadedness, near syncope, particularly with exertion. Patient also describes black stool, and is found to have occult GI bleed likely causing his symptoms. Patient required resuscitation with fluids, blood transfusion. Patient required admission for further evaluation and management after stabilization in the ED.  CRITICAL CARE Performed by: Gerhard MunchLOCKWOOD, Anne-Marie Genson Total critical care time: 35 Critical care time was exclusive of separately billable procedures and treating other patients. Critical care was necessary to treat or prevent imminent or life-threatening deterioration. Critical care was time spent personally by me on the following activities: development of treatment plan with patient  and/or surrogate as well as nursing, discussions with consultants, evaluation of patient's response to treatment, examination of patient, obtaining history from patient or surrogate, ordering and performing treatments and interventions, ordering and review of laboratory studies, ordering and review of radiographic studies, pulse oximetry and re-evaluation of patient's condition.    Gerhard Munch, MD 10/06/14 [phone]

## 2014-10-06 NOTE — ED Notes (Signed)
Bed: WA17 Expected date:  Expected time:  Means of arrival:  Comments: EMS- 38yo, dizziness

## 2014-10-06 NOTE — H&P (Signed)
[facility] History and Physical  Gabriel LindauBilly Daigle YQM:[id number] DOB: 08/24/1976 DOA: 10/06/2014  Referring physician: Gerhard Munchobert Lockwood, MD PCP: Robynn PaneHARWANI,MOHAN N, MD   Chief Complaint: Dizziness and GI bleed  HPI: Gabriel LindauBilly Garcia is a 38 y.o. male presents with dizziness. Patient states that he was notig every time he got up that he was getting dizzy. Patient states he actually fell to the bed and he thinks that he went out for a second. Patient states that he has no chest pain. He states that he has noted some shortness of breath and he feels a chest pain when he feels like he is going to pass out. Patient has some hunger pains presently. He states he has not eaten for two days. Patient has noted that his stool is black. Patient has not noted any red blood in the stool. He state she has noted some diarrhea. No edema noted. No headache noted at this time. He states that he takes ibuprofen all the time for soreness and hurting in his elbow.    Review of Systems:  Constitutional:  No weight loss, night sweats, Fevers, chills, ++fatigue.  HEENT:  No headaches  Cardio-vascular:  +chest pain, no Orthopnea, PND, swelling in lower extremities ++dizziness, no palpitations  GI:  +abdominal pain, nausea, vomiting, ++diarrhea, ++loss of appetite  Resp:  ++shortness of breath with exertion. no productive cough, No coughing up of blood  Skin:  no rash or lesions GU:  no dysuria, change in color of urine, no urgency or frequency.  Musculoskeletal:  No joint pain or swelling. No decreased range of motion. No back pain.  Psych:  No change in mood or affect. No depression or anxiety. No memory loss.   Past Medical History  Diagnosis Date  . Hypertension   . Hepatitis C   . 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 Terminal ileitis   . Mesenteric adenitis   . Sliding hiatal hernia   . Substance abuse     cocaine   Past Surgical History  Procedure Laterality Date  . Eye  surgery Right   . Foot surgery Right    Social History:  reports that he has never smoked. He has never used smokeless tobacco. He reports that he drinks alcohol. He reports that he uses illicit drugs (Cocaine).  No Known Allergies  Family History  Problem Relation Age of Onset  . Rheum arthritis Mother   . Hypertension Mother   . Heart failure Father   . Colon cancer Neg Hx      Prior to Admission medications   Medication Sig Start Date End Date Taking? Authorizing Provider  ibuprofen (ADVIL,MOTRIN) 200 MG tablet Take 800 mg by mouth every 6 (six) hours as needed (pain).    Yes Historical Provider, MD  lisinopril (PRINIVIL,ZESTRIL) 10 MG tablet Take 1 tablet (10 mg total) by mouth 2 (two) times daily. 07/06/14  Yes Audree CamelScott T Goldston, MD  metoprolol tartrate (LOPRESSOR) 25 MG tablet Take 1 tablet (25 mg total) by mouth 2 (two) times daily. 07/06/14  Yes Audree CamelScott T Goldston, MD  oxyCODONE (ROXICODONE) 5 MG immediate release tablet Take 1 tablet (5 mg total) by mouth every 4 (four) hours as needed for severe pain. Patient not taking: Reported on 10/06/2014 07/06/14   Audree CamelScott T Goldston, MD  pantoprazole (PROTONIX) 40 MG tablet Take 1 tablet (40 mg total) by mouth daily. Patient not taking: Reported on 10/06/2014 07/06/14   Audree CamelScott T Goldston, MD   Physical  Exam: Filed Vitals:   10/06/14 1736 10/06/14 1800 10/06/14 1830 10/06/14 1834  BP: 112/66 116/70 111/65   Pulse: 103 103 110   Temp:    97.4 F (36.3 C)  TempSrc:    Oral  Resp: 17 11 19    SpO2: 100% 100% 100%     Wt Readings from Last 3 Encounters:  11/17/13 94.892 kg (209 lb 3.2 oz)  11/16/13 92.987 kg (205 lb)  11/04/13 95.255 kg (210 lb)    General:  Appears calm and comfortable Eyes: PERRL, normal lids, irises & conjunctiva ENT: grossly normal hearing, lips & tongue Neck: no LAD, masses or thyromegaly Cardiovascular: RRR, no m/r/g. No LE edema. Telemetry: SR, no arrhythmias  Respiratory: CTA bilaterally, no w/r/r. Normal  respiratory effort. Abdomen: soft, slight epigastric tenderness Skin: no rash or induration seen on limited exam Musculoskeletal: grossly normal tone BUE/BLE Psychiatric: grossly normal mood and affect, speech fluent and appropriate Neurologic: grossly non-focal.          Labs on Admission:  Basic Metabolic Panel:  Recent Labs Lab 10/06/14 1614  NA 141  K 4.4  CL 113*  CO2 23  GLUCOSE 120*  BUN 54*  CREATININE 1.36*  CALCIUM 8.6   Liver Function Tests:  Recent Labs Lab 10/06/14 1614  AST 97*  ALT 148*  ALKPHOS 36*  BILITOT 0.8  PROT 6.3  ALBUMIN 3.5    Recent Labs Lab 10/06/14 1614  LIPASE 15   No results for input(s): AMMONIA in the last 168 hours. CBC:  Recent Labs Lab 10/06/14 1614  WBC 7.0  NEUTROABS 4.3  HGB 8.6*  HCT 27.1*  MCV 93.4  PLT 266   Cardiac Enzymes: No results for input(s): CKTOTAL, CKMB, CKMBINDEX, TROPONINI in the last 168 hours.  BNP (last 3 results) No results for input(s): PROBNP in the last 8760 hours. CBG: No results for input(s): GLUCAP in the last 168 hours.  Radiological Exams on Admission: No results found.   Assessment/Plan Active Problems:   GI bleed   Hypertension   1. GI Bleed -likely related to NSAID usage -will be transfused per ER orders and being symptomatic -will monitor on telemetry -monitor labs -will need GI consultation  2. Hypertension -will monitor pressures -home meds held due to pressure being on low side and increased symptoms -will resume once pressures are more stable and transfused  3. Acute renal insufficiency -likely secondary to poor po intake and GI bleeding -hydrate and monitor labs    Code Status: Full Code (must indicate code status--if unknown or must be presumed, indicate so) DVT Prophylaxis:SCDs Family Communication: None (indicate person spoken with, if applicable, with phone number if by telephone) Disposition Plan: Home (indicate anticipated LOS)  Time spent:  70min  [facility] CenterKHAN,SAADAT A [facility] Pager [phone]

## 2014-10-07 LAB — COMPREHENSIVE METABOLIC PANEL
ALT: 120 U/L — ABNORMAL HIGH (ref 0–53)
AST: 71 U/L — ABNORMAL HIGH (ref 0–37)
Albumin: 3 g/dL — ABNORMAL LOW (ref 3.5–5.2)
Alkaline Phosphatase: 30 U/L — ABNORMAL LOW (ref 39–117)
Anion gap: 5 (ref 5–15)
BUN: 38 mg/dL — ABNORMAL HIGH (ref 6–23)
CO2: 21 mmol/L (ref 19–32)
CREATININE: 1.21 mg/dL (ref 0.50–1.35)
Calcium: 7.8 mg/dL — ABNORMAL LOW (ref 8.4–10.5)
Chloride: 109 mEq/L (ref 96–112)
GFR, EST AFRICAN AMERICAN: 86 mL/min — AB (ref >90)
GFR, EST NON AFRICAN AMERICAN: 75 mL/min — AB (ref >90)
GLUCOSE: 91 mg/dL (ref 70–99)
Potassium: 3.5 mmol/L (ref 3.5–5.1)
Sodium: 135 mmol/L (ref 135–145)
TOTAL PROTEIN: 5.5 g/dL — AB (ref 6.0–8.3)
Total Bilirubin: 0.5 mg/dL (ref 0.3–1.2)

## 2014-10-07 LAB — TYPE AND SCREEN
ABO/RH(D): A POS
ANTIBODY SCREEN: NEGATIVE
Unit division: 0
Unit division: 0

## 2014-10-07 LAB — CBC
HCT: 26.6 % — ABNORMAL LOW (ref 39.0–52.0)
HEMOGLOBIN: 8.8 g/dL — AB (ref 13.0–17.0)
MCH: 29.7 pg (ref 26.0–34.0)
MCHC: 33.1 g/dL (ref 30.0–36.0)
MCV: 89.9 fL (ref 78.0–100.0)
Platelets: 203 10*3/uL (ref 150–400)
RBC: 2.96 MIL/uL — ABNORMAL LOW (ref 4.22–5.81)
RDW: 14.9 % (ref 11.5–15.5)
WBC: 8.6 10*3/uL (ref 4.0–10.5)

## 2014-10-07 LAB — TSH: TSH: 1.146 u[IU]/mL (ref 0.350–4.500)

## 2014-10-07 LAB — HEMOGLOBIN A1C
HEMOGLOBIN A1C: 5 % (ref <5.7)
MEAN PLASMA GLUCOSE: 97 mg/dL (ref <117)

## 2014-10-07 LAB — GLUCOSE, CAPILLARY: Glucose-Capillary: 100 mg/dL — ABNORMAL HIGH (ref 70–99)

## 2014-10-07 MED ORDER — SUCRALFATE 1 GM/10ML PO SUSP
1.0000 g | Freq: Three times a day (TID) | ORAL | Status: DC
  Administered 2014-10-07: 1 g via ORAL
  Filled 2014-10-07 (×5): qty 10

## 2014-10-07 MED ORDER — PANTOPRAZOLE SODIUM 40 MG IV SOLR
40.0000 mg | Freq: Two times a day (BID) | INTRAVENOUS | Status: DC
  Filled 2014-10-07 (×2): qty 40

## 2014-10-07 NOTE — Progress Notes (Signed)
Patient requested to leave AMA. I notified him of his risk and Dr. Gwenlyn PerkingMadera was notified. The patient signed the papers and he left with family.

## 2014-10-07 NOTE — Discharge Summary (Signed)
Physician Discharge Summary  Jenean LindauBilly Bagsby JXB:[id number] DOB: 01/12/1976 DOA: 10/06/2014  PCP: Robynn PaneHARWANI,MOHAN N, MD  Admit date: 10/06/2014 Discharge date: 10/07/2014  Time spent: 10 minutes  Recommendations for Outpatient Follow-up:  Patient Left AMA; no recommendations were able to be given  Discharge Diagnoses:  Active Problems:   GI bleed   Hypertension   GI bleed due to NSAIDs   Discharge Condition: Patient left AMA; assume stable/improved condition. VSS  Filed Weights   10/06/14 2010  Weight: 87.998 kg (194 lb)    History of present illness:  38 y.o. male presents with dizziness. Patient states that he was notig every time he got up that he was getting dizzy. Patient states he actually fell to the bed and he thinks that he went out for a second. Patient states that he has no chest pain. He states that he has noted some shortness of breath and he feels a chest pain when he feels like he is going to pass out. Patient has some hunger pains presently. He states he has not eaten for two days. Patient has noted that his stool is black. Patient has not noted any red blood in the stool. He state she has noted some diarrhea. No edema noted. No headache noted at this time. He states that he takes ibuprofen all the time for soreness and hurting in his elbow.  Hospital Course:  Patient admitted with signs and symptoms of symptomatic anemia. Thought to be secondary to slow upper GI bleed from use of NSAID's. Please referred to Dr. Welton FlakesKhan H&P for further details/info on admission. Patient was not seen on day of discharge as he left AMA.  Procedures:  See below for any x-ray reports   Consultations:  GI was called  Discharge Exam: Filed Vitals:   10/07/14 0607  BP: 122/87  Pulse: 86  Temp: 98 F (36.7 C)  Resp: 14    Patient LEFT AMA despite me asking to wait to be seen and informed of GI consultation made. Physical exam unable to be completed as patient has left by the time I  got to the floor.  Discharge Instructions Patient left AMA not instructions were provided.   Discharge Medication List as of 10/07/2014 10:45 AM    CONTINUE these medications which have NOT CHANGED   Details  ibuprofen (ADVIL,MOTRIN) 200 MG tablet Take 800 mg by mouth every 6 (six) hours as needed (pain). , Until Discontinued, Historical Med    lisinopril (PRINIVIL,ZESTRIL) 10 MG tablet Take 1 tablet (10 mg total) by mouth 2 (two) times daily., Starting 07/06/2014, Until Discontinued, Print    metoprolol tartrate (LOPRESSOR) 25 MG tablet Take 1 tablet (25 mg total) by mouth 2 (two) times daily., Starting 07/06/2014, Until Discontinued, Print    oxyCODONE (ROXICODONE) 5 MG immediate release tablet Take 1 tablet (5 mg total) by mouth every 4 (four) hours as needed for severe pain., Starting 07/06/2014, Until Discontinued, Print    pantoprazole (PROTONIX) 40 MG tablet Take 1 tablet (40 mg total) by mouth daily., Starting 07/06/2014, Until Discontinued, Print       No Known Allergies    The results of significant diagnostics from this hospitalization (including imaging, microbiology, ancillary and laboratory) are listed below for reference.     Labs: Basic Metabolic Panel:  Recent Labs Lab 10/06/14 1614 10/07/14 0145  NA 141 135  K 4.4 3.5  CL 113* 109  CO2 23 21  GLUCOSE 120* 91  BUN 54* 38*  CREATININE 1.36* 1.21  CALCIUM 8.6 7.8*   Liver Function Tests:  Recent Labs Lab 10/06/14 1614 10/07/14 0145  AST 97* 71*  ALT 148* 120*  ALKPHOS 36* 30*  BILITOT 0.8 0.5  PROT 6.3 5.5*  ALBUMIN 3.5 3.0*    Recent Labs Lab 10/06/14 1614  LIPASE 15   CBC:  Recent Labs Lab 10/06/14 1614 10/07/14 0145  WBC 7.0 8.6  NEUTROABS 4.3  --   HGB 8.6* 8.8*  HCT 27.1* 26.6*  MCV 93.4 89.9  PLT 266 203   CBG:  Recent Labs Lab 10/07/14 0719  GLUCAP 100*    Signed:  Vassie LollMadera, Preciliano Castell  [facility] 10/07/2014, 11:20 AM

## 2014-10-09 ENCOUNTER — Emergency Department (HOSPITAL_COMMUNITY)
Admission: EM | Admit: 2014-10-09 | Discharge: 2014-10-09 | Disposition: A | Discharge status: Home / Self Care | Payer: 59 | Attending: Emergency Medicine | Admitting: Emergency Medicine

## 2014-10-09 ENCOUNTER — Encounter (HOSPITAL_COMMUNITY): Payer: Self-pay | Admitting: *Deleted

## 2014-10-09 DIAGNOSIS — D649 Anemia, unspecified: Secondary | ICD-10-CM | POA: Insufficient documentation

## 2014-10-09 DIAGNOSIS — Z79899 Other long term (current) drug therapy: Secondary | ICD-10-CM | POA: Insufficient documentation

## 2014-10-09 DIAGNOSIS — I10 Essential (primary) hypertension: Secondary | ICD-10-CM | POA: Insufficient documentation

## 2014-10-09 DIAGNOSIS — K922 Gastrointestinal hemorrhage, unspecified: Secondary | ICD-10-CM

## 2014-10-09 DIAGNOSIS — Z8619 Personal history of other infectious and parasitic diseases: Secondary | ICD-10-CM | POA: Insufficient documentation

## 2014-10-09 DIAGNOSIS — H179 Unspecified corneal scar and opacity: Secondary | ICD-10-CM | POA: Insufficient documentation

## 2014-10-09 DIAGNOSIS — H548 Legal blindness, as defined in USA: Secondary | ICD-10-CM | POA: Insufficient documentation

## 2014-10-09 LAB — COMPREHENSIVE METABOLIC PANEL
ALBUMIN: 3.6 g/dL (ref 3.5–5.2)
ALK PHOS: 38 U/L — AB (ref 39–117)
ALT: 140 U/L — AB (ref 0–53)
ANION GAP: 5 (ref 5–15)
AST: 86 U/L — ABNORMAL HIGH (ref 0–37)
BUN: 8 mg/dL (ref 6–23)
CALCIUM: 9 mg/dL (ref 8.4–10.5)
CO2: 29 mmol/L (ref 19–32)
Chloride: 108 mEq/L (ref 96–112)
Creatinine, Ser: 1.07 mg/dL (ref 0.50–1.35)
GFR calc non Af Amer: 86 mL/min — ABNORMAL LOW (ref >90)
GLUCOSE: 110 mg/dL — AB (ref 70–99)
Potassium: 3.3 mmol/L — ABNORMAL LOW (ref 3.5–5.1)
Sodium: 142 mmol/L (ref 135–145)
TOTAL PROTEIN: 6.8 g/dL (ref 6.0–8.3)
Total Bilirubin: 0.4 mg/dL (ref 0.3–1.2)

## 2014-10-09 LAB — CBC
HEMATOCRIT: 28.9 % — AB (ref 39.0–52.0)
HEMOGLOBIN: 9.2 g/dL — AB (ref 13.0–17.0)
MCH: 29.7 pg (ref 26.0–34.0)
MCHC: 31.8 g/dL (ref 30.0–36.0)
MCV: 93.2 fL (ref 78.0–100.0)
Platelets: 254 10*3/uL (ref 150–400)
RBC: 3.1 MIL/uL — AB (ref 4.22–5.81)
RDW: 15.1 % (ref 11.5–15.5)
WBC: 5.3 10*3/uL (ref 4.0–10.5)

## 2014-10-09 LAB — TYPE AND SCREEN
ABO/RH(D): A POS
ANTIBODY SCREEN: NEGATIVE

## 2014-10-09 MED ORDER — SODIUM CHLORIDE 0.9 % IV SOLN
INTRAVENOUS | Status: DC
  Administered 2014-10-09: 125 mL/h via INTRAVENOUS

## 2014-10-09 MED ORDER — PANTOPRAZOLE SODIUM 40 MG IV SOLR
40.0000 mg | Freq: Once | INTRAVENOUS | Status: AC
  Administered 2014-10-09: 40 mg via INTRAVENOUS
  Filled 2014-10-09: qty 40

## 2014-10-09 MED ORDER — PANTOPRAZOLE SODIUM 40 MG PO TBEC: 40.0000 mg | DELAYED_RELEASE_TABLET | Freq: Every day | ORAL | Status: DC

## 2014-10-09 NOTE — ED Notes (Addendum)
Pt complains of weakness, palpitations. [facility] for GI bleed and anemia; pt left AMA. Pt states he received 2 units of blood, states he did not want to stay in the hospital at the time. Pt states his blood pressure was low and his heart rate was elevated at home. Pt appears pale. Pt denies nausea, emesis, pain.

## 2014-10-09 NOTE — ED Provider Notes (Signed)
CSN: [id number]     Arrival date & time 10/09/14  1251 History   First MD Initiated Contact with Patient 10/09/14 1432     Chief Complaint  Patient presents with  . Weakness     (Consider location/radiation/quality/duration/timing/severity/associated sxs/prior Treatment) HPI  Gabriel Garcia is a 38 y.o. male who presents for evaluation of weakness.  He was in the hospital and left AGAINST MEDICAL ADVICE on 10/07/2014.  Since then he has not been able to eat.  He has mild nausea.  He has not had a bowel movement since in the hospital.  He had been admitted for an apparent upper GI bleed.  He received 2 units transfusion for blood loss anemia, acute.  He denies chest pain, headache, or back pain.  He does not smoke cigarettes.    Past Medical History  Diagnosis Date  . Hypertension   . Hepatitis C   . 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 Terminal ileitis   . Mesenteric adenitis   . Sliding hiatal hernia   . Substance abuse     cocaine   Past Surgical History  Procedure Laterality Date  . Eye surgery Right   . Foot surgery Right    Family History  Problem Relation Age of Onset  . Rheum arthritis Mother   . Hypertension Mother   . Heart failure Father   . Colon cancer Neg Hx    History  Substance Use Topics  . Smoking status: Never Smoker   . Smokeless tobacco: Never Used  . Alcohol Use: Yes     Comment: occasionally    Review of Systems  All other systems reviewed and are negative.     Allergies  Review of patient's allergies indicates no known allergies.  Home Medications   Prior to Admission medications   Medication Sig Start Date End Date Taking? Authorizing Provider  ibuprofen (ADVIL,MOTRIN) 200 MG tablet Take 800 mg by mouth every 6 (six) hours as needed (pain).    Yes Historical Provider, MD  lisinopril (PRINIVIL,ZESTRIL) 10 MG tablet Take 1 tablet (10 mg total) by mouth 2 (two) times daily. 07/06/14  Yes Audree CamelScott T Goldston, MD   metoprolol tartrate (LOPRESSOR) 25 MG tablet Take 1 tablet (25 mg total) by mouth 2 (two) times daily. 07/06/14  Yes Audree CamelScott T Goldston, MD  Oxygen Permeable Lens Products (RENU REWETTING DROPS) SOLN Place 1 drop into both eyes 3 (three) times daily as needed (dry eyes).   Yes Historical Provider, MD  oxyCODONE (ROXICODONE) 5 MG immediate release tablet Take 1 tablet (5 mg total) by mouth every 4 (four) hours as needed for severe pain. Patient not taking: Reported on 10/06/2014 07/06/14   Audree CamelScott T Goldston, MD  pantoprazole (PROTONIX) 40 MG tablet Take 1 tablet (40 mg total) by mouth daily. Patient not taking: Reported on 10/06/2014 07/06/14   Audree CamelScott T Goldston, MD   BP 149/95 mmHg  Pulse 95  Temp(Src) 98.1 F (36.7 C)  Resp 18  SpO2 100% Physical Exam  Constitutional: He is oriented to person, place, and time. He appears well-developed and well-nourished.  HENT:  Head: Normocephalic and atraumatic.  Right Ear: External ear normal.  Left Ear: External ear normal.  Eyes: Conjunctivae and EOM are normal.  Right anterior cornea is opaque.  Left pupil is normal.  Neck: Normal range of motion and phonation normal. Neck supple.  Cardiovascular: Normal rate, regular rhythm and normal heart sounds.   Pulmonary/Chest: Effort normal and breath  sounds normal. He exhibits no bony tenderness.  Abdominal: Soft. There is no tenderness.  Musculoskeletal: Normal range of motion.  Neurological: He is alert and oriented to person, place, and time. No cranial nerve deficit or sensory deficit. He exhibits normal muscle tone. Coordination normal.  Skin: Skin is warm, dry and intact.  Psychiatric: He has a normal mood and affect. His behavior is normal. Judgment and thought content normal.  Nursing note and vitals reviewed.   ED Course  Procedures (including critical care time)  Medications  0.9 %  sodium chloride infusion (not administered)    Patient Vitals for the past 24 hrs:  BP Temp Pulse Resp SpO2   10/09/14 1301 149/95 mmHg 98.1 F (36.7 C) 95 18 100 %    3:26 PM Reevaluation with update and discussion. After initial assessment and treatment, an updated evaluation reveals he is interested in being readmitted to the hospital, and continue the treatment, which was previously planned.Mancel Bale. Mandee Pluta L   3:39 PM-Consult complete with Dr. Susie CassetteAbrol. Patient case explained and discussed. She evaluated the findings for consideration of admission.  She does not believe that he meets criteria for admission. Call ended at 16:00  I discussed the recommendation by the hospitalist with the patient, he agrees to this treatment plan.    Labs Revied Labs Reviewed  COMPREHENSIVE METABOLIC PANEL - Abnormal; Notable for the following:    Potassium 3.3 (*)    Glucose, Bld 110 (*)    AST 86 (*)    ALT 140 (*)    Alkaline Phosphatase 38 (*)    GFR calc non Af Amer 86 (*)    All other components within normal limits  CBC - Abnormal; Notable for the following:    RBC 3.10 (*)    Hemoglobin 9.2 (*)    HCT 28.9 (*)    All other components within normal limits  TYPE AND SCREEN    Imaging Review No results found.   EKG Interpretation None      MDM   Final diagnoses:  Anemia, unspecified anemia type  Gastrointestinal hemorrhage, unspecified gastritis, unspecified gastrointestinal hemorrhage type     GI bleeding, with anemia, and likely dehydration .  Exact etiology of bleeding, is not clear. Patient is hemodynamically stable.  His BUN is normal.  He can be managed as an outpatient.    Nursing Notes Reviewed/ Care Coordinated, and agree without changes. Applicable Imaging Reviewed.  Interpretation of Laboratory Data incorporated into ED treatment   Nursing Notes Reviewed/ Care Coordinated Applicable Imaging Reviewed Interpretation of Laboratory Data incorporated into ED treatment  The patient appears reasonably screened and/or stabilized for discharge and I doubt any other medical  condition or other [facility] requiring further screening, evaluation, or treatment in the ED at this time prior to discharge.  Plan: Home Medications- Protonix, oral iron; Home Treatments- rest, fluids; return here if the recommended treatment, does not improve the symptoms; Recommended follow up- GI f/u for endoscopy on 1-2 weeks   Flint MelterElliott L Airanna Partin, MD 10/09/14 1616

## 2014-10-09 NOTE — Discharge Instructions (Signed)
Drink plenty of fluids. Try to eat foods that are high in iron content. You can also take an iron pill once or twice a day for 1 month, to improve your hemoglobin level.    Gastrointestinal Bleeding Gastrointestinal (GI) bleeding means there is bleeding somewhere along the digestive tract, between the mouth and anus. CAUSES  There are many different problems that can cause GI bleeding. Possible causes include:  Esophagitis. This is inflammation, irritation, or swelling of the esophagus.  Hemorrhoids.These are veins that are full of blood (engorged) in the rectum. They cause pain, inflammation, and may bleed.  Anal fissures.These are areas of painful tearing which may bleed. They are often caused by passing hard stool.  Diverticulosis.These are pouches that form on the colon over time, with age, and may bleed significantly.  Diverticulitis.This is inflammation in areas with diverticulosis. It can cause pain, fever, and bloody stools, although bleeding is rare.  Polyps and cancer. Colon cancer often starts out as precancerous polyps.  Gastritis and ulcers.Bleeding from the upper gastrointestinal tract (near the stomach) may travel through the intestines and produce black, sometimes tarry, often bad smelling stools. In certain cases, if the bleeding is fast enough, the stools may not be black, but red. This condition may be life-threatening. SYMPTOMS   Vomiting bright red blood or material that looks like coffee grounds.  Bloody, black, or tarry stools. DIAGNOSIS  Your caregiver may diagnose your condition by taking your history and performing a physical exam. More tests may be needed, including:  X-rays and other imaging tests.  Esophagogastroduodenoscopy (EGD). This test uses a flexible, lighted tube to look at your esophagus, stomach, and small intestine.  Colonoscopy. This test uses a flexible, lighted tube to look at your colon. TREATMENT  Treatment depends on the cause  of your bleeding.   For bleeding from the esophagus, stomach, small intestine, or colon, the caregiver doing your EGD or colonoscopy may be able to stop the bleeding as part of the procedure.  Inflammation or infection of the colon can be treated with medicines.  Many rectal problems can be treated with creams, suppositories, or warm baths.  Surgery is sometimes needed.  Blood transfusions are sometimes needed if you have lost a lot of blood. If bleeding is slow, you may be allowed to go home. If there is a lot of bleeding, you will need to stay in the hospital for observation. HOME CARE INSTRUCTIONS   Take any medicines exactly as prescribed.  Keep your stools soft by eating foods that are high in fiber. These foods include whole grains, legumes, fruits, and vegetables. Prunes (1 to 3 a day) work well for many people.  Drink enough fluids to keep your urine clear or pale yellow. SEEK IMMEDIATE MEDICAL CARE IF:   Your bleeding increases.  You feel lightheaded, weak, or you faint.  You have severe cramps in your back or abdomen.  You pass large blood clots in your stool.  Your problems are getting worse. MAKE SURE YOU:   Understand these instructions.  Will watch your condition.  Will get help right away if you are not doing well or get worse. Document Released: 09/27/2000 Document Revised: 09/16/2012 Document Reviewed: 09/09/2011 [place] [employer] [facility] Patient Information 2015 [employer] [place]. This information is not intended to replace advice given to you by your health care provider. Make sure you discuss any questions you have with your health care provider.

## 2014-10-12 ENCOUNTER — Encounter (HOSPITAL_COMMUNITY): Payer: Self-pay

## 2014-10-12 ENCOUNTER — Inpatient Hospital Stay (HOSPITAL_COMMUNITY)
Admission: EM | Admit: 2014-10-12 | Discharge: 2014-10-13 | DRG: 378 | Disposition: A | Discharge status: Home / Self Care | Payer: 59 | Attending: Internal Medicine | Admitting: Internal Medicine

## 2014-10-12 DIAGNOSIS — D500 Iron deficiency anemia secondary to blood loss (chronic): Secondary | ICD-10-CM

## 2014-10-12 DIAGNOSIS — Z6827 Body mass index (BMI) 27.0-27.9, adult: Secondary | ICD-10-CM

## 2014-10-12 DIAGNOSIS — K449 Diaphragmatic hernia without obstruction or gangrene: Secondary | ICD-10-CM | POA: Diagnosis present

## 2014-10-12 DIAGNOSIS — K219 Gastro-esophageal reflux disease without esophagitis: Secondary | ICD-10-CM | POA: Diagnosis present

## 2014-10-12 DIAGNOSIS — K5000 Crohn's disease of small intestine without complications: Secondary | ICD-10-CM | POA: Diagnosis present

## 2014-10-12 DIAGNOSIS — R740 Nonspecific elevation of levels of transaminase and lactic acid dehydrogenase [LDH]: Secondary | ICD-10-CM | POA: Diagnosis present

## 2014-10-12 DIAGNOSIS — R9431 Abnormal electrocardiogram [ECG] [EKG]: Secondary | ICD-10-CM | POA: Diagnosis present

## 2014-10-12 DIAGNOSIS — D62 Acute posthemorrhagic anemia: Secondary | ICD-10-CM

## 2014-10-12 DIAGNOSIS — I880 Nonspecific mesenteric lymphadenitis: Secondary | ICD-10-CM | POA: Diagnosis present

## 2014-10-12 DIAGNOSIS — E663 Overweight: Secondary | ICD-10-CM | POA: Diagnosis present

## 2014-10-12 DIAGNOSIS — R7989 Other specified abnormal findings of blood chemistry: Secondary | ICD-10-CM | POA: Diagnosis present

## 2014-10-12 DIAGNOSIS — K922 Gastrointestinal hemorrhage, unspecified: Secondary | ICD-10-CM | POA: Diagnosis present

## 2014-10-12 DIAGNOSIS — I248 Other forms of acute ischemic heart disease: Secondary | ICD-10-CM | POA: Diagnosis present

## 2014-10-12 DIAGNOSIS — I10 Essential (primary) hypertension: Secondary | ICD-10-CM | POA: Diagnosis present

## 2014-10-12 DIAGNOSIS — K260 Acute duodenal ulcer with hemorrhage: Principal | ICD-10-CM | POA: Diagnosis present

## 2014-10-12 DIAGNOSIS — B182 Chronic viral hepatitis C: Secondary | ICD-10-CM

## 2014-10-12 DIAGNOSIS — B1920 Unspecified viral hepatitis C without hepatic coma: Secondary | ICD-10-CM | POA: Diagnosis present

## 2014-10-12 DIAGNOSIS — H5441 Blindness, right eye, normal vision left eye: Secondary | ICD-10-CM | POA: Diagnosis present

## 2014-10-12 HISTORY — DX: Acute duodenal ulcer with hemorrhage: K26.0

## 2014-10-12 HISTORY — DX: Gastro-esophageal reflux disease without esophagitis: K21.9

## 2014-10-12 HISTORY — DX: Gastrointestinal hemorrhage, unspecified: K92.2

## 2014-10-12 LAB — COMPREHENSIVE METABOLIC PANEL
ALK PHOS: 38 U/L — AB (ref 39–117)
ALT: 166 U/L — AB (ref 0–53)
AST: 102 U/L — ABNORMAL HIGH (ref 0–37)
Albumin: 3.6 g/dL (ref 3.5–5.2)
Anion gap: 3 — ABNORMAL LOW (ref 5–15)
BUN: 13 mg/dL (ref 6–23)
CO2: 26 mmol/L (ref 19–32)
Calcium: 8.8 mg/dL (ref 8.4–10.5)
Chloride: 109 mEq/L (ref 96–112)
Creatinine, Ser: 1.3 mg/dL (ref 0.50–1.35)
GFR calc non Af Amer: 68 mL/min — ABNORMAL LOW (ref >90)
GFR, EST AFRICAN AMERICAN: 79 mL/min — AB (ref >90)
GLUCOSE: 105 mg/dL — AB (ref 70–99)
POTASSIUM: 3.7 mmol/L (ref 3.5–5.1)
SODIUM: 138 mmol/L (ref 135–145)
Total Bilirubin: 0.3 mg/dL (ref 0.3–1.2)
Total Protein: 6.7 g/dL (ref 6.0–8.3)

## 2014-10-12 LAB — CBC WITH DIFFERENTIAL/PLATELET
BASOS PCT: 0 % (ref 0–1)
Basophils Absolute: 0 10*3/uL (ref 0.0–0.1)
Eosinophils Absolute: 0.1 10*3/uL (ref 0.0–0.7)
Eosinophils Relative: 2 % (ref 0–5)
HCT: 27.4 % — ABNORMAL LOW (ref 39.0–52.0)
Hemoglobin: 8.7 g/dL — ABNORMAL LOW (ref 13.0–17.0)
Lymphocytes Relative: 28 % (ref 12–46)
Lymphs Abs: 1.4 10*3/uL (ref 0.7–4.0)
MCH: 29.9 pg (ref 26.0–34.0)
MCHC: 31.8 g/dL (ref 30.0–36.0)
MCV: 94.2 fL (ref 78.0–100.0)
Monocytes Absolute: 0.4 10*3/uL (ref 0.1–1.0)
Monocytes Relative: 8 % (ref 3–12)
NEUTROS PCT: 62 % (ref 43–77)
Neutro Abs: 3.1 10*3/uL (ref 1.7–7.7)
PLATELETS: 321 10*3/uL (ref 150–400)
RBC: 2.91 MIL/uL — ABNORMAL LOW (ref 4.22–5.81)
RDW: 15.3 % (ref 11.5–15.5)
WBC: 5 10*3/uL (ref 4.0–10.5)

## 2014-10-12 LAB — SURGICAL PCR SCREEN
MRSA, PCR: NEGATIVE
STAPHYLOCOCCUS AUREUS: NEGATIVE

## 2014-10-12 LAB — PROTIME-INR
INR: 1.02 (ref 0.00–1.49)
PROTHROMBIN TIME: 13.5 s (ref 11.6–15.2)

## 2014-10-12 LAB — POC OCCULT BLOOD, ED: Fecal Occult Bld: POSITIVE — AB

## 2014-10-12 LAB — TROPONIN I: Troponin I: <0.03 ng/mL (ref <0.031)

## 2014-10-12 LAB — HEMOGLOBIN AND HEMATOCRIT, BLOOD
HCT: 25 % — ABNORMAL LOW (ref 39.0–52.0)
Hemoglobin: 7.7 g/dL — ABNORMAL LOW (ref 13.0–17.0)

## 2014-10-12 MED ORDER — PANTOPRAZOLE SODIUM 40 MG IV SOLR
40.0000 mg | Freq: Once | INTRAVENOUS | Status: AC
  Administered 2014-10-12: 40 mg via INTRAVENOUS
  Filled 2014-10-12: qty 40

## 2014-10-12 MED ORDER — PANTOPRAZOLE SODIUM 40 MG IV SOLR
40.0000 mg | Freq: Two times a day (BID) | INTRAVENOUS | Status: DC
  Administered 2014-10-13: 40 mg via INTRAVENOUS
  Filled 2014-10-12 (×3): qty 40

## 2014-10-12 MED ORDER — METOPROLOL TARTRATE 25 MG PO TABS
25.0000 mg | ORAL_TABLET | Freq: Two times a day (BID) | ORAL | Status: DC
  Administered 2014-10-12 – 2014-10-13 (×2): 25 mg via ORAL
  Filled 2014-10-12 (×3): qty 1

## 2014-10-12 MED ORDER — SODIUM CHLORIDE 0.9 % IV BOLUS (SEPSIS)
1000.0000 mL | Freq: Once | INTRAVENOUS | Status: AC
  Administered 2014-10-12: 1000 mL via INTRAVENOUS

## 2014-10-12 MED ORDER — ACETAMINOPHEN 650 MG RE SUPP: 650.0000 mg | Freq: Four times a day (QID) | RECTAL | Status: DC | PRN

## 2014-10-12 MED ORDER — RENU REWETTING DROPS SOLN: 1.0000 [drp] | Freq: Three times a day (TID) | Status: DC | PRN

## 2014-10-12 MED ORDER — LISINOPRIL 10 MG PO TABS
10.0000 mg | ORAL_TABLET | Freq: Two times a day (BID) | ORAL | Status: DC
  Administered 2014-10-12 – 2014-10-13 (×2): 10 mg via ORAL
  Filled 2014-10-12 (×3): qty 1

## 2014-10-12 MED ORDER — PANTOPRAZOLE SODIUM 40 MG IV SOLR
40.0000 mg | Freq: Two times a day (BID) | INTRAVENOUS | Status: DC
  Administered 2014-10-12: 40 mg via INTRAVENOUS
  Filled 2014-10-12 (×3): qty 40

## 2014-10-12 MED ORDER — PROMETHAZINE HCL 25 MG PO TABS: 12.5000 mg | ORAL_TABLET | Freq: Four times a day (QID) | ORAL | Status: DC | PRN

## 2014-10-12 MED ORDER — SODIUM CHLORIDE 0.9 % IV SOLN
INTRAVENOUS | Status: DC
  Administered 2014-10-12 – 2014-10-13 (×2): via INTRAVENOUS
  Administered 2014-10-13: 500 mL via INTRAVENOUS

## 2014-10-12 MED ORDER — ACETAMINOPHEN 325 MG PO TABS: 650.0000 mg | ORAL_TABLET | Freq: Four times a day (QID) | ORAL | Status: DC | PRN

## 2014-10-12 NOTE — ED Notes (Signed)
Pt c/o dark, tarry stools and weakness starting this morning.  Denies pain.  Pt reports that he was recently admitted for same, but left AMA and was seen on 12/27 and told that his "levels were not low enough to get admitted."

## 2014-10-12 NOTE — Consult Note (Signed)
Consultation  Referring Provider: [facility]      Primary Care Physician:  Robynn PaneHARWANI,MOHAN N, MD Primary Gastroenterologist: Gentry FitzUnassigned. He was discharged from our practice. Reason for Consultation:   GI bleed           HPI:   Gabriel Garcia is a 38 y.o. male who we saw in the office in February for an ED follow up. Patient had presented to ED with abdominal pain, vomiting, and diarrhea. CTscan suggested Crohn's ileitis. Patient failed to show up for outpatient endoscopy on a few occasions so we sent him a dismissal from practice letter in June.    Patient came to ED the evening of Christmas Eve with melena / syncope. Hgb was 8.6, down from baseline of around 14. He received 2 units of blood, the following morning he left AMA with hgb of 8.8.  Patient returned to ED 12/27 with weakness but hgb was up to 9.2 and he was deemed stable for discharge. Stools normalized in color but now becoming dark again. Hgb not significantly changed (8.7).   He reports intermittent black stools for 6 months which he thought was from Pepto / tums.  After discontinuation of bismuth he continued to have intermittent black stools. He complains of heartburn, points to epigastrium.    Past Medical History  Diagnosis Date  . Hypertension   . Hepatitis C   . 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 Terminal ileitis   . Mesenteric adenitis   . Sliding hiatal hernia   . Substance abuse     cocaine  . GI bleed     Past Surgical History  Procedure Laterality Date  . Eye surgery Right   . Foot surgery Right     Family History  Problem Relation Age of Onset  . Rheum arthritis Mother   . Hypertension Mother   . Heart failure Father   . Colon cancer Neg Hx      History  Substance Use Topics  . Smoking status: Never Smoker   . Smokeless tobacco: Never Used  . Alcohol Use: Yes     Comment: occasionally    Prior to Admission medications   Medication Sig Start Date End Date  Taking? Authorizing Provider  ibuprofen (ADVIL,MOTRIN) 200 MG tablet Take 800 mg by mouth 2 (two) times daily as needed for moderate pain (pain).   Yes Historical Provider, MD  lisinopril (PRINIVIL,ZESTRIL) 10 MG tablet Take 1 tablet (10 mg total) by mouth 2 (two) times daily. 07/06/14  Yes Audree CamelScott T Goldston, MD  metoprolol tartrate (LOPRESSOR) 25 MG tablet Take 1 tablet (25 mg total) by mouth 2 (two) times daily. 07/06/14  Yes Audree CamelScott T Goldston, MD  Oxygen Permeable Lens Products (RENU REWETTING DROPS) SOLN Place 1 drop into both eyes 3 (three) times daily as needed (dry eyes).   Yes Historical Provider, MD  pantoprazole (PROTONIX) 40 MG tablet Take 1 tablet (40 mg total) by mouth daily. 10/09/14  Yes Flint MelterElliott L Wentz, MD    No current facility-administered medications for this encounter.   Current Outpatient Prescriptions  Medication Sig Dispense Refill  . ibuprofen (ADVIL,MOTRIN) 200 MG tablet Take 800 mg by mouth 2 (two) times daily as needed for moderate pain (pain).    [name]. lisinopril (PRINIVIL,ZESTRIL) 10 MG tablet Take 1 tablet (10 mg total) by mouth 2 (two) times daily. 30 tablet 1  . metoprolol tartrate (LOPRESSOR) 25 MG tablet Take 1 tablet (25 mg total) by  mouth 2 (two) times daily. 60 tablet 1  . Oxygen Permeable Lens Products (RENU REWETTING DROPS) SOLN Place 1 drop into both eyes 3 (three) times daily as needed (dry eyes).    . pantoprazole (PROTONIX) 40 MG tablet Take 1 tablet (40 mg total) by mouth daily. 30 tablet 0    Allergies as of 10/12/2014  . (No Known Allergies)    Review of Systems:    Positive for mild weight loss. All other systems reviewed and negative except where noted in HPI.    Physical Exam:  Vital signs in last 24 hours: Temp:  [97.8 F (36.6 C)-98.1 F (36.7 C)] 98.1 F (36.7 C) (12/30 1403) Pulse Rate:  [83-119] 83 (12/30 1403) Resp:  [16-20] 20 (12/30 1403) BP: (121-128)/(88-93) 128/88 mmHg (12/30 1403) SpO2:  [97 %-100 %] 100 % (12/30 1403)     General:    Pleasant white male in NAD Head:  Normocephalic and atraumatic. Eyes:   No icterus.   Conjunctiva pink. Ears:  Normal auditory acuity. Neck:  Supple; no masses felt Lungs:  Respirations even and unlabored. Lungs clear to auscultation bilaterally.   No wheezes, crackles, or rhonchi.  Heart:  Regular rate and rhythm;  murmur heard. Abdomen:  Soft, nondistended, nontender. Normal bowel sounds. No appreciable masses or hepatomegaly.  Rectal:  Not repeated. Heme positive.   Msk:  Symmetrical without gross deformities.  Extremities:  Without edema. Neurologic:  Alert and  oriented x4;  grossly normal neurologically. Skin:  Intact without significant lesions or rashes. Cervical Nodes:  No significant cervical adenopathy. Psych:  Alert and cooperative. Normal affect.  LAB RESULTS:  Recent Labs  10/12/14 1346  WBC 5.0  HGB 8.7*  HCT 27.4*  PLT 321   BMET  Recent Labs  10/12/14 1346  NA 138  K 3.7  CL 109  CO2 26  GLUCOSE 105*  BUN 13  CREATININE 1.30  CALCIUM 8.8   LFT  Recent Labs  10/12/14 1346  PROT 6.7  ALBUMIN 3.6  AST 102*  ALT 166*  ALKPHOS 38*  BILITOT 0.3      PREVIOUS ENDOSCOPIES:            none   Impression / Plan:   881. 38 year old male with intermittent black stools and epigastric burning over last 6 months in setting of excessive NSAID use. Symptoms associated with a several gram drop in hgb. Suspect PUD in setting of NSAIDs. Patient needs an EGD. He doesn't appear to be actively bleeding. He can have clears today, NPO after MN. BID PPI. Serial H&H, transfuse if necessary   2. Transaminitis (chronic). HCV listed in PMH. Liver biopsy 2002 -: CHRONIC MODERATELY ACTIVE HEPATITIS, INFLAMMATORY GRADE 3 AND FIBROSIS GRADE 0. No stigmata of chronic liver disease on exam. Normal platelets. Someone needs to follow his chronic liver disease. Unsure if all the autoimmune / genetic studies done or if he has HCV for sure. Patient was sent a  dismissal letter by Dr. Juanda ChanceBrodie so he needs to find a different outpatient gastroenterologist upon discharge.   3. Abnormal CTscan Feb 2015 - terminal ileitis. He cancelled colonoscopy but suspect this was infectious as stool positive for Rotavirus. No diarrhea since.     Thanks   LOS: 0 days   Willette Clusteraula Guenther  10/12/2014, 4:12 PM    [facility] Attending  I have also seen and assessed the patient and agree with the above note. The risks and benefits as well as alternatives of  endoscopic procedure(s) have been discussed and reviewed. All questions answered. The patient agrees to proceed.  Suspect he did have hepatitis C or does. Will clarify with him re: did he get any Tx, etc. Does not have clinical signs of cirrhosis.  Iva Boop, MD, [facility] [phone] (pager) 10/12/2014 5:37 PM

## 2014-10-12 NOTE — H&P (Signed)
History and Physical:    Gabriel Garcia ZOX:[id number] DOB: [birth year] DOA: 10/12/2014  Referring physician: Dr. Criss Alvine PCP: Robynn Pane, MD   Chief Complaint: Melena and dizziness.  History of Present Illness:   Gabriel Garcia is an 38 y.o. male with a PMH of terminal ileitis, hepatis C, mesenteric adenitis, and recent recurrent visits to the ED for evaluation of melena and dizziness, admitted but left AMA on 09/27/14, who re-presents with ongoing melena and dizziness.  The patient noticed black stools 10/06/14, but says he has had some intermittent dark stools over the past year but attributed it to using Alka Seltzer and Pepto Bismol.  He had a pre-syncopal episode 10/06/14 and received 2 units of blood during that hospitalization.  He came back to the ED 10/09/14 but did not have a significant enough anemia to be admitted.  He went to work today, but felt too weak to continue working.  Although he is not currently taking NSAIDs, he was taking 8 Ibuprofen a day up until 10/05/14.  Denies abdominal pain.  No nausea or vomiting.  +Racing heart/chest tightness.  ROS:   Constitutional: No fever, no chills;  Appetite normal; + 15 lb weight loss, no weight gain, + fatigue.  HEENT: No blurry vision, + right eye blindness, no diplopia, no pharyngitis, + occasional dysphagia CV: No chest pain, no palpitations, no PND, no orthopnea, no edema.  Resp: No SOB, no cough, no pleuritic pain. GI: No nausea, no vomiting, no diarrhea, + melena, no hematochezia, no constipation, no abdominal pain.  GU: No dysuria, no hematuria, no frequency, no urgency. MSK: no myalgias, no arthralgias.  Neuro:  No headache, no focal neurological deficits, no history of seizures.  Psych: No depression, no anxiety.  Endo: No heat intolerance, no cold intolerance, no polyuria, no polydipsia  Skin: No rashes, no skin lesions.  Heme: No easy bruising.  Travel history: No recent travel.   Past Medical History:   Past Medical  History  Diagnosis Date  . Hypertension   . Hepatitis C   . 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 Terminal ileitis   . Mesenteric adenitis   . Sliding hiatal hernia   . Substance abuse     cocaine  . GI bleed   . GERD (gastroesophageal reflux disease)     Past Surgical History:   Past Surgical History  Procedure Laterality Date  . Eye surgery Right   . Foot surgery Right     Social History:   History   Social History  . Marital Status: Single    Spouse Name: N/A    Number of Children: 1  . Years of Education: N/A   Occupational History  . dye cutter helper    Social History Main Topics  . Smoking status: Never Smoker   . Smokeless tobacco: Never Used  . Alcohol Use: Yes     Comment: occasionally  . Drug Use: Yes    Special: Cocaine     Comment: uses cocaine 1-2 /month  . Sexual Activity: Not on file   Other Topics Concern  . Not on file   Social History Narrative   Single.  Lives with his mother.  Works [employer].    Family history:   Family History  Problem Relation Age of Onset  . Rheum arthritis Mother   . Hypertension Mother   . Heart failure Father   . Colon cancer Neg Hx     Allergies  Review of patient's allergies indicates no known allergies.  Current Medications:   Prior to Admission medications   Medication Sig Start Date End Date Taking? Authorizing Provider  ibuprofen (ADVIL,MOTRIN) 200 MG tablet Take 800 mg by mouth 2 (two) times daily as needed for moderate pain (pain).   Yes Historical Provider, MD  lisinopril (PRINIVIL,ZESTRIL) 10 MG tablet Take 1 tablet (10 mg total) by mouth 2 (two) times daily. 07/06/14  Yes Audree CamelScott T Goldston, MD  metoprolol tartrate (LOPRESSOR) 25 MG tablet Take 1 tablet (25 mg total) by mouth 2 (two) times daily. 07/06/14  Yes Audree CamelScott T Goldston, MD  Oxygen Permeable Lens Products (RENU REWETTING DROPS) SOLN Place 1 drop into both eyes 3 (three) times daily as needed (dry eyes).   Yes Historical  Provider, MD  pantoprazole (PROTONIX) 40 MG tablet Take 1 tablet (40 mg total) by mouth daily. 10/09/14  Yes Flint MelterElliott L Wentz, MD    Physical Exam:   Filed Vitals:   10/12/14 1218 10/12/14 1403  BP: 121/93 128/88  Pulse: 119 83  Temp: 97.8 F (36.6 C) 98.1 F (36.7 C)  TempSrc: Oral Oral  Resp: 16 20  SpO2: 97% 100%     Physical Exam: Blood pressure 128/88, pulse 83, temperature 98.1 F (36.7 C), temperature source Oral, resp. rate 20, SpO2 100 %. Gen: No acute distress. Head: Normocephalic, atraumatic. Eyes: Right eye blind, left round, reactive to light, sclerae nonicteric. Mouth: Oropharynx clear. Neck: Supple, no thyromegaly, no lymphadenopathy, no jugular venous distention. Chest: Lungs CTAB. CV: Heart sounds are regular.  No M/R/G. Abdomen: Soft, nontender, nondistended with normal active bowel sounds. Extremities: Extremities are without C/E/C. Skin: Warm and dry. Neuro: Alert and oriented times 3; cranial nerves II through XII grossly intact. Psych: Mood and affect normal.   Data Review:    Labs: Basic Metabolic Panel:  Recent Labs Lab 10/06/14 1614 10/07/14 0145 10/09/14 1319 10/12/14 1346  NA 141 135 142 138  K 4.4 3.5 3.3* 3.7  CL 113* 109 108 109  CO2 23 21 29 26   GLUCOSE 120* 91 110* 105*  BUN 54* 38* 8 13  CREATININE 1.36* 1.21 1.07 1.30  CALCIUM 8.6 7.8* 9.0 8.8   Liver Function Tests:  Recent Labs Lab 10/06/14 1614 10/07/14 0145 10/09/14 1319 10/12/14 1346  AST 97* 71* 86* 102*  ALT 148* 120* 140* 166*  ALKPHOS 36* 30* 38* 38*  BILITOT 0.8 0.5 0.4 0.3  PROT 6.3 5.5* 6.8 6.7  ALBUMIN 3.5 3.0* 3.6 3.6    Recent Labs Lab 10/06/14 1614  LIPASE 15   CBC:  Recent Labs Lab 10/06/14 1614 10/07/14 0145 10/09/14 1319 10/12/14 1346  WBC 7.0 8.6 5.3 5.0  NEUTROABS 4.3  --   --  3.1  HGB 8.6* 8.8* 9.2* 8.7*  HCT 27.1* 26.6* 28.9* 27.4*  MCV 93.4 89.9 93.2 94.2  PLT 266 203 254 321   CBG:  Recent Labs Lab 10/07/14 0719    GLUCAP 100*    Radiographic Studies: No results found.  EKG: Independently reviewed. Sinus rhythm; Abnormal R-wave progression, early transition; T wave abnormality not present compared to Oct 06 2014.   Assessment/Plan:   Principal Problem:   Upper GI bleed / Acute blood loss anemia  Start BID IV Protonix.  GI consult for EGD.  Likely NSAID induced ulceration or gastritis.  Monitor H&H and transfuse as needed.  Active Problems:   EKG abnormality  Likely from demand ischemia.  Cycle troponins.    Hepatitis C  Mild LFT elevation noted.    Esophageal reflux  PPI started.    Hypertension  Continue lisinopril and metoprolol.    DVT prophylaxis  SCDs.  Code Status: Full. Family Communication: Holley RaringDebbie Guarino (mother) [phone] or [phone] updated at bedside. Disposition Plan: Home when stable.  Time spent: 1 hour.  Jerrelle Michelsen [facility] Pager [phone] Cell: [phone]   If 7PM-7AM, please contact night-coverage [email or web address] Password [email or web address] 10/12/2014, 4:37 PM

## 2014-10-12 NOTE — ED Provider Notes (Signed)
CSN: [id number]     Arrival date & time 10/12/14  1206 History   First MD Initiated Contact with Patient 10/12/14 1308     Chief Complaint  Patient presents with  . Rectal Bleeding     (Consider location/radiation/quality/duration/timing/severity/associated sxs/prior Treatment) HPI  38 year old male presents with near-syncope and a dark bowel movement earlier today. He's been feeling weak and fatigued while at work. 6 days ago he had an acute onset of a presumed upper GI bleed with melena and anemia. Received 2 units of blood while in the hospital. Was told it would be multiple days before you be seen by GI due to the holidays and thus he left AMA. Patient came back 3 days ago due to dizziness and near syncope and was told his hemoglobin level was not low enough to be admitted. Patient had a normal bowel movement yesterday, no bowel movement in the last couple days before that. Today he felt hot and sweaty while at work and went to the bathroom with some epigastric pain and had a dark bowel movement. Was not quite tarry like the earlier ones. Has mild epigastric pain. Has been taking the Protonix as prescribed but has not gotten the iron due to financial difficulty. Has no follow-up with GI due to no insurance. Feels better while at rest but states that he gets winded and fatigued with minimal exertion. Nearly passed out while on the toilet. He feels his skin is paler than normal.  Past Medical History  Diagnosis Date  . Hypertension   . Hepatitis C   . 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 Terminal ileitis   . Mesenteric adenitis   . Sliding hiatal hernia   . Substance abuse     cocaine  . GI bleed    Past Surgical History  Procedure Laterality Date  . Eye surgery Right   . Foot surgery Right    Family History  Problem Relation Age of Onset  . Rheum arthritis Mother   . Hypertension Mother   . Heart failure Father   . Colon cancer Neg Hx    History   Substance Use Topics  . Smoking status: Never Smoker   . Smokeless tobacco: Never Used  . Alcohol Use: Yes     Comment: occasionally    Review of Systems  Constitutional: Positive for fatigue.  Respiratory: Positive for shortness of breath.   Cardiovascular: Negative for chest pain.  Gastrointestinal: Positive for abdominal pain. Negative for nausea, vomiting, diarrhea, constipation and blood in stool (black stool, no bright red blood).  Neurological: Positive for weakness and light-headedness. Negative for syncope (near syncope).      Allergies  Review of patient's allergies indicates no known allergies.  Home Medications   Prior to Admission medications   Medication Sig Start Date End Date Taking? Authorizing Provider  ibuprofen (ADVIL,MOTRIN) 200 MG tablet Take 800 mg by mouth 2 (two) times daily as needed for moderate pain (pain).   Yes Historical Provider, MD  lisinopril (PRINIVIL,ZESTRIL) 10 MG tablet Take 1 tablet (10 mg total) by mouth 2 (two) times daily. 07/06/14  Yes Audree CamelScott T Izola Teague, MD  metoprolol tartrate (LOPRESSOR) 25 MG tablet Take 1 tablet (25 mg total) by mouth 2 (two) times daily. 07/06/14  Yes Audree CamelScott T Taurean Ju, MD  Oxygen Permeable Lens Products (RENU REWETTING DROPS) SOLN Place 1 drop into both eyes 3 (three) times daily as needed (dry eyes).   Yes Historical Provider, MD  pantoprazole (PROTONIX) 40 MG tablet Take 1 tablet (40 mg total) by mouth daily. 10/09/14  Yes Flint MelterElliott L Wentz, MD   BP 121/93 mmHg  Pulse 119  Temp(Src) 97.8 F (36.6 C) (Oral)  Resp 16  SpO2 97% Physical Exam  Constitutional: He is oriented to person, place, and time. He appears well-developed and well-nourished.  HENT:  Head: Normocephalic and atraumatic.  Right Ear: External ear normal.  Left Ear: External ear normal.  Nose: Nose normal.  Eyes: Right eye exhibits no discharge. Left eye exhibits no discharge.  Neck: Neck supple.  Cardiovascular: Normal rate, regular rhythm,  normal heart sounds and intact distal pulses.   Pulmonary/Chest: Effort normal and breath sounds normal.  Abdominal: Soft. He exhibits no distension. There is no tenderness.  Genitourinary: Rectal exam shows no external hemorrhoid and no internal hemorrhoid.  Dark brown stool on rectal exam  Musculoskeletal: He exhibits no edema.  Neurological: He is alert and oriented to person, place, and time.  Skin: Skin is warm and dry.  Nursing note and vitals reviewed.   ED Course  Procedures (including critical care time) Labs Review Labs Reviewed  COMPREHENSIVE METABOLIC PANEL - Abnormal; Notable for the following:    Glucose, Bld 105 (*)    AST 102 (*)    ALT 166 (*)    Alkaline Phosphatase 38 (*)    GFR calc non Af Amer 68 (*)    GFR calc Af Amer 79 (*)    Anion gap 3 (*)    All other components within normal limits  CBC WITH DIFFERENTIAL - Abnormal; Notable for the following:    RBC 2.91 (*)    Hemoglobin 8.7 (*)    HCT 27.4 (*)    All other components within normal limits  POC OCCULT BLOOD, ED - Abnormal; Notable for the following:    Fecal Occult Bld POSITIVE (*)    All other components within normal limits    Imaging Review No results found.   EKG Interpretation None      MDM   Final diagnoses:  Gastrointestinal hemorrhage, unspecified gastritis, unspecified gastrointestinal hemorrhage type  Anemia, blood loss    Patient initially has a heart rate of 119, on recheck while in the room his heart rate is 80 and has remained such with no hypotension. Patient has no significant abdominal tenderness. He does have heme positive stool and a small drop in his hemoglobin. I discussed with gastroenterology who is not able to scope him today and thus they recommend admission to the hospitalist and will do an EGD in the morning.    Audree CamelScott T Zera Markwardt, MD 10/12/14 [id number]

## 2014-10-13 ENCOUNTER — Encounter (HOSPITAL_COMMUNITY): Payer: Self-pay

## 2014-10-13 ENCOUNTER — Encounter (HOSPITAL_COMMUNITY)
Admission: EM | Disposition: A | Discharge status: Home / Self Care | Payer: Self-pay | Source: Home / Self Care | Attending: Internal Medicine

## 2014-10-13 DIAGNOSIS — K260 Acute duodenal ulcer with hemorrhage: Principal | ICD-10-CM

## 2014-10-13 HISTORY — PX: ESOPHAGOGASTRODUODENOSCOPY: SHX5428

## 2014-10-13 LAB — BASIC METABOLIC PANEL
Anion gap: 4 — ABNORMAL LOW (ref 5–15)
BUN: 13 mg/dL (ref 6–23)
CO2: 26 mmol/L (ref 19–32)
Calcium: 8.4 mg/dL (ref 8.4–10.5)
Chloride: 109 mEq/L (ref 96–112)
Creatinine, Ser: 0.99 mg/dL (ref 0.50–1.35)
GFR calc Af Amer: >90 mL/min (ref >90)
Glucose, Bld: 94 mg/dL (ref 70–99)
Potassium: 3.6 mmol/L (ref 3.5–5.1)
SODIUM: 139 mmol/L (ref 135–145)

## 2014-10-13 LAB — CBC
HEMATOCRIT: 25.9 % — AB (ref 39.0–52.0)
HEMOGLOBIN: 8 g/dL — AB (ref 13.0–17.0)
MCH: 29.3 pg (ref 26.0–34.0)
MCHC: 30.9 g/dL (ref 30.0–36.0)
MCV: 94.9 fL (ref 78.0–100.0)
PLATELETS: 298 10*3/uL (ref 150–400)
RBC: 2.73 MIL/uL — AB (ref 4.22–5.81)
RDW: 15.3 % (ref 11.5–15.5)
WBC: 6.1 10*3/uL (ref 4.0–10.5)

## 2014-10-13 LAB — TROPONIN I: Troponin I: <0.03 ng/mL (ref <0.031)

## 2014-10-13 SURGERY — EGD (ESOPHAGOGASTRODUODENOSCOPY)
Anesthesia: Moderate Sedation

## 2014-10-13 MED ORDER — DOCUSATE SODIUM 100 MG PO CAPS: 100.0000 mg | ORAL_CAPSULE | Freq: Two times a day (BID) | ORAL | Status: DC

## 2014-10-13 MED ORDER — FERROUS SULFATE 325 (65 FE) MG PO TABS: 325.0000 mg | ORAL_TABLET | Freq: Three times a day (TID) | ORAL | Status: DC

## 2014-10-13 MED ORDER — PANTOPRAZOLE SODIUM 40 MG IV SOLR: 40.0000 mg | Freq: Every day | INTRAVENOUS | Status: DC

## 2014-10-13 MED ORDER — BUTAMBEN-TETRACAINE-BENZOCAINE 2-2-14 % EX AERO
INHALATION_SPRAY | CUTANEOUS | Status: DC | PRN
  Administered 2014-10-13: 2 via TOPICAL

## 2014-10-13 MED ORDER — FENTANYL CITRATE 0.05 MG/ML IJ SOLN
INTRAMUSCULAR | Status: AC
  Filled 2014-10-13: qty 2

## 2014-10-13 MED ORDER — MIDAZOLAM HCL 10 MG/2ML IJ SOLN
INTRAMUSCULAR | Status: AC
  Filled 2014-10-13: qty 4

## 2014-10-13 MED ORDER — DIPHENHYDRAMINE HCL 50 MG/ML IJ SOLN
INTRAMUSCULAR | Status: DC | PRN
  Administered 2014-10-13: 25 mg via INTRAVENOUS

## 2014-10-13 MED ORDER — DIPHENHYDRAMINE HCL 50 MG/ML IJ SOLN
INTRAMUSCULAR | Status: AC
  Filled 2014-10-13: qty 1

## 2014-10-13 MED ORDER — FENTANYL CITRATE 0.05 MG/ML IJ SOLN
INTRAMUSCULAR | Status: DC | PRN
  Administered 2014-10-13: 25 ug via INTRAVENOUS

## 2014-10-13 MED ORDER — MIDAZOLAM HCL 10 MG/2ML IJ SOLN
INTRAMUSCULAR | Status: DC | PRN
  Administered 2014-10-13: 2 mg via INTRAVENOUS
  Administered 2014-10-13: 1 mg via INTRAVENOUS
  Administered 2014-10-13: 2 mg via INTRAVENOUS

## 2014-10-13 NOTE — Progress Notes (Signed)
[facility] Progress Note   Gabriel Garcia ZOX:[id number] DOB: 02/14/1976 DOA: 10/12/2014 PCP: Robynn PaneHARWANI,MOHAN N, MD  Brief narrative: Gabriel Garcia is a 38 y.o. male PMH of terminal ileitis, hepatis C, mesenteric adenitis, and recent recurrent visits to the ED for evaluation of melena and dizziness, admitted but left AMA on 09/27/14, who re-presents with ongoing melena and dizziness. He had a pre-syncopal episode 10/06/14 and received 2 units of blood during that hospitalization.  Although he is not currently taking NSAIDs, he was taking 8 Ibuprofen a day up until 10/05/14 for left elbow tendinitis.   Subjective: The patient has no complaints  Assessment/Plan: Principal Problem:   Upper GI bleed -Hemoccult positive in the ER -Plans for EGD today -Advised to not use NSAIDs any longer and use Tylenol instead  Active Problems:   Acute blood loss anemia -Follow H&H for need for transfusion    Hepatitis C -Needs further follow-up as outpatient    Esophageal reflux -Based on PPI    Hypertension -Continue metoprolol and lisinopril     Code Status: Full code Family Communication:  Disposition Plan: Home when stable DVT prophylaxis: SCDs  Consultants: GI  Procedures: EGD today  Antibiotics: Anti-infectives    None         Objective: Filed Weights   10/12/14 1748  Weight: 88.406 kg (194 lb 14.4 oz)    Intake/Output Summary (Last 24 hours) at 10/13/14 0959 Last data filed at 10/13/14 0600  Gross per 24 hour  Intake 1121.67 ml  Output   1150 ml  Net -28.33 ml     Vitals Filed Vitals:   10/12/14 1403 10/12/14 1748 10/12/14 2132 10/13/14 0517  BP: 128/88 140/87 147/80 124/84  Pulse: 83 73 78 65  Temp: 98.1 F (36.7 C) 98.2 F (36.8 C) 98.1 F (36.7 C) 98 F (36.7 C)  TempSrc: Oral Oral Oral Oral  Resp: 20 18 18 18   Height:  5\' 10"  (1.778 m)    Weight:  88.406 kg (194 lb 14.4 oz)    SpO2: 100% 100% 100% 100%    Exam: General: Awake alert  oriented 3, No acute respiratory distress Lungs: Clear to auscultation bilaterally without wheezes or crackles Cardiovascular: Regular rate and rhythm without murmur gallop or rub normal S1 and S2 Abdomen: Nontender, nondistended, soft, bowel sounds positive, no rebound, no ascites, no appreciable mass Extremities: No significant cyanosis, clubbing, or edema bilateral lower extremities  Data Reviewed: Basic Metabolic Panel:  Recent Labs Lab 10/06/14 1614 10/07/14 0145 10/09/14 1319 10/12/14 1346 10/13/14 0456  NA 141 135 142 138 139  K 4.4 3.5 3.3* 3.7 3.6  CL 113* 109 108 109 109  CO2 23 21 29 26 26   GLUCOSE 120* 91 110* 105* 94  BUN 54* 38* 8 13 13   CREATININE 1.36* 1.21 1.07 1.30 0.99  CALCIUM 8.6 7.8* 9.0 8.8 8.4   Liver Function Tests:  Recent Labs Lab 10/06/14 1614 10/07/14 0145 10/09/14 1319 10/12/14 1346  AST 97* 71* 86* 102*  ALT 148* 120* 140* 166*  ALKPHOS 36* 30* 38* 38*  BILITOT 0.8 0.5 0.4 0.3  PROT 6.3 5.5* 6.8 6.7  ALBUMIN 3.5 3.0* 3.6 3.6    Recent Labs Lab 10/06/14 1614  LIPASE 15   No results for input(s): AMMONIA in the last 168 hours. CBC:  Recent Labs Lab 10/06/14 1614 10/07/14 0145 10/09/14 1319 10/12/14 1346 10/12/14 2256 10/13/14 0456  WBC 7.0 8.6 5.3 5.0  --  6.1  NEUTROABS 4.3  --   --  3.1  --   --   HGB 8.6* 8.8* 9.2* 8.7* 7.7* 8.0*  HCT 27.1* 26.6* 28.9* 27.4* 25.0* 25.9*  MCV 93.4 89.9 93.2 94.2  --  94.9  PLT 266 203 254 321  --  298   Cardiac Enzymes:  Recent Labs Lab 10/12/14 1646 10/12/14 2256 10/13/14 0456  TROPONINI <0.03 <0.03 <0.03   BNP (last 3 results) No results for input(s): PROBNP in the last 8760 hours. CBG:  Recent Labs Lab 10/07/14 0719  GLUCAP 100*    Recent Results (from the past 240 hour(s))  Surgical pcr screen     Status: None   Collection Time: 10/12/14  8:04 PM  Result Value Ref Range Status   MRSA, PCR NEGATIVE NEGATIVE Final   Staphylococcus aureus NEGATIVE NEGATIVE Final     Comment:        The Xpert SA Assay (FDA approved for NASAL specimens in patients over 38 years of age), is one component of a comprehensive surveillance program.  Test performance has been validated by [facility] for patients greater than or equal to 38 year old. It is not intended to diagnose infection nor to guide or monitor treatment.      Studies:  Recent x-ray studies have been reviewed in detail by the Attending Physician  Scheduled Meds:  Scheduled Meds: . lisinopril  10 mg Oral BID  . metoprolol tartrate  25 mg Oral BID  . pantoprazole (PROTONIX) IV  40 mg Intravenous Q12H  . pantoprazole (PROTONIX) IV  40 mg Intravenous Q12H   Continuous Infusions: . sodium chloride 100 mL/hr at 10/13/14 0600    Time spent on care of this patient: 35 minutes   Jaxyn Rout, MD 10/13/2014, 9:59 AM  LOS: 1 day   [facility] Office  [phone] Pager - Text Page per [email or web address]  If 7PM-7AM, please contact night-coverage [email or web address]

## 2014-10-13 NOTE — Progress Notes (Signed)
Nutrition Brief Note  Patient identified on the Malnutrition Screening Tool (MST) Report  Per pt he usually weighs about 210 lb but has lost weight starting about 2 months ago. Pt picked up a second job and is more active that he was before. Pt eats at every meal and snack and continues to have a big appetite.   Wt Readings from Last 15 Encounters:  10/12/14 194 lb 14.4 oz (88.406 kg)  10/06/14 194 lb (87.998 kg)  11/17/13 209 lb 3.2 oz (94.892 kg)  11/16/13 205 lb (92.987 kg)  11/04/13 210 lb (95.255 kg)  06/13/11 212 lb 6.4 oz (96.344 kg)    Body mass index is 27.97 kg/(m^2). Patient meets criteria for overweight based on current BMI.   Current diet order is NPO, patient is very hungry. Labs and medications reviewed.   No nutrition interventions warranted at this time. If nutrition issues arise, please consult RD.   Kendell BaneHeather Jezel Basto RD, LDN, CNSC [phone] Pager [phone] After Hours Pager

## 2014-10-13 NOTE — Progress Notes (Signed)
Nurse reviewed discharge instructions with pt.  Pt verbalized understanding of discharge instructions, new medications and follow up appointments. No concerns at time of discharge. 

## 2014-10-13 NOTE — Discharge Summary (Signed)
Physician Discharge Summary  Gabriel LindauBilly Nordell GNF:[id number] DOB: 07/08/1976 DOA: 10/12/2014  PCP: Robynn PaneHARWANI,MOHAN N, MD  Admit date: 10/12/2014 Discharge date: 10/13/2014  Time spent:45 minutes  Recommendations for Outpatient Follow-up:  1. GI f/u in 1 mo  Discharge Condition: stable Diet recommendation: heart healthy  Discharge Diagnoses:  Principal Problem:   Acute duodenal ulcer with hemorrhage Active Problems:   Hepatitis C   Esophageal reflux   Hypertension   EKG abnormality   Acute blood loss anemia   History of present illness:  Principal Problem:  Upper GI bleed -Hemoccult positive in the ER - EGD reveals 2 small clean based duodenal bulb ulcers and an esophageal diverticulum - will need daily PPI x 2 mo  - CLO test pending - Per Dr Leone PayorGessner, patient will need to follow up with another GI group as he has been dismissed from their practice due to multiple no-shows- have given him the number for [facility] -Advised to not use NSAIDs any longer and use Tylenol instead  Active Problems:  Acute blood loss anemia -Hb stable at 8 - will start Iron TID   Hepatitis C -Needs further follow-up as outpatient   Esophageal reflux -placed on PPI   Hypertension -Continue metoprolol and lisinopril  Consultations:  GI  Discharge Exam: Filed Weights   10/12/14 1748  Weight: 88.406 kg (194 lb 14.4 oz)   Filed Vitals:   10/13/14 1224  BP: 126/73  Pulse: 61  Temp: 98 F (36.7 C)  Resp: 18    General: AAO x 3, no distress Cardiovascular: RRR, no murmurs  Respiratory: clear to auscultation bilaterally GI: soft, non-tender, non-distended, bowel sound positive  Discharge Instructions You were cared for by a hospitalist during your hospital stay. If you have any questions about your discharge medications or the care you received while you were in the hospital after you are discharged, you can call the unit and asked to speak with the hospitalist on call if the  hospitalist that took care of you is not available. Once you are discharged, your primary care physician will handle any further medical issues. Please note that NO REFILLS for any discharge medications will be authorized once you are discharged, as it is imperative that you return to your primary care physician (or establish a relationship with a primary care physician if you do not have one) for your aftercare needs so that they can reassess your need for medications and monitor your lab values.      Discharge Instructions    Diet - low sodium heart healthy    Complete by:  As directed      Increase activity slowly    Complete by:  As directed             Medication List    STOP taking these medications        ibuprofen 200 MG tablet  Commonly known as:  ADVIL,MOTRIN      TAKE these medications        docusate sodium 100 MG capsule  Commonly known as:  COLACE  Take 1 capsule (100 mg total) by mouth 2 (two) times daily.     ferrous sulfate 325 (65 FE) MG tablet  Take 1 tablet (325 mg total) by mouth 3 (three) times daily with meals.     lisinopril 10 MG tablet  Commonly known as:  PRINIVIL,ZESTRIL  Take 1 tablet (10 mg total) by mouth 2 (two) times daily.     metoprolol tartrate 25 MG  tablet  Commonly known as:  LOPRESSOR  Take 1 tablet (25 mg total) by mouth 2 (two) times daily.     pantoprazole 40 MG tablet  Commonly known as:  PROTONIX  Take 1 tablet (40 mg total) by mouth daily.     pantoprazole 40 MG injection  Commonly known as:  PROTONIX  Inject 40 mg into the vein daily.     RENU REWETTING DROPS Soln  Place 1 drop into both eyes 3 (three) times daily as needed (dry eyes).       No Known Allergies Follow-up Information    Follow up with [facility] E, MD In 1 month.   Specialty:  Gastroenterology   Why:  for any GI (stomach) issues   Contact information:   [address] [place] [address] [phone]       Follow up with Judyann Munson, MD In 1 month.   Specialty:  Infectious Diseases   Why:  for treatment of Hep C   Contact information:   [address] [place] [address] [phone]        The results of significant diagnostics from this hospitalization (including imaging, microbiology, ancillary and laboratory) are listed below for reference.    Significant Diagnostic Studies: No results found.  Microbiology: Recent Results (from the past 240 hour(s))  Surgical pcr screen     Status: None   Collection Time: 10/12/14  8:04 PM  Result Value Ref Range Status   MRSA, PCR NEGATIVE NEGATIVE Final   Staphylococcus aureus NEGATIVE NEGATIVE Final    Comment:        The Xpert SA Assay (FDA approved for NASAL specimens in patients over 18 years of age), is one component of a comprehensive surveillance program.  Test performance has been validated by [facility] for patients greater than or equal to 60 year old. It is not intended to diagnose infection nor to guide or monitor treatment.      Labs: Basic Metabolic Panel:  Recent Labs Lab 10/06/14 1614 10/07/14 0145 10/09/14 1319 10/12/14 1346 10/13/14 0456  NA 141 135 142 138 139  K 4.4 3.5 3.3* 3.7 3.6  CL 113* 109 108 109 109  CO2 23 21 29 26 26   GLUCOSE 120* 91 110* 105* 94  BUN 54* 38* 8 13 13   CREATININE 1.36* 1.21 1.07 1.30 0.99  CALCIUM 8.6 7.8* 9.0 8.8 8.4   Liver Function Tests:  Recent Labs Lab 10/06/14 1614 10/07/14 0145 10/09/14 1319 10/12/14 1346  AST 97* 71* 86* 102*  ALT 148* 120* 140* 166*  ALKPHOS 36* 30* 38* 38*  BILITOT 0.8 0.5 0.4 0.3  PROT 6.3 5.5* 6.8 6.7  ALBUMIN 3.5 3.0* 3.6 3.6    Recent Labs Lab 10/06/14 1614  LIPASE 15   No results for input(s): AMMONIA in the last 168 hours. CBC:  Recent Labs Lab 10/06/14 1614 10/07/14 0145 10/09/14 1319 10/12/14 1346 10/12/14 2256 10/13/14 0456  WBC 7.0 8.6 5.3 5.0  --  6.1  NEUTROABS 4.3  --   --  3.1  --   --   HGB 8.6* 8.8*  9.2* 8.7* 7.7* 8.0*  HCT 27.1* 26.6* 28.9* 27.4* 25.0* 25.9*  MCV 93.4 89.9 93.2 94.2  --  94.9  PLT 266 203 254 321  --  298   Cardiac Enzymes:  Recent Labs Lab 10/12/14 1646 10/12/14 2256 10/13/14 0456  TROPONINI <0.03 <0.03 <0.03   BNP: BNP (last 3 results) No  results for input(s): PROBNP in the last 8760 hours. CBG:  Recent Labs Lab 10/07/14 0719  GLUCAP 100*       SignedCalvert Cantor:  Kaliana Albino, MD [facility] 10/13/2014, 12:38 PM

## 2014-10-13 NOTE — Op Note (Signed)
[facility] [address] [place], [id number]   ENDOSCOPY PROCEDURE REPORT  Garcia: Jenean LindauStewart, Jordani  MR#: [id number] BIRTHDATE: 08-06-1976 , 38  yrs. old GENDER: male ENDOSCOPIST: Iva Booparl E Tonna Palazzi, MD, [facility] PROCEDURE DATE:  10/13/2014 PROCEDURE:  EGD w/ biopsy for H.pylori ASA CLASS:     Class II INDICATIONS:  melena/GI bleed.  was using ibuprofen in large and frequent amounts MEDICATIONS: Benadryl 25 mg IV, Fentanyl 25 mcg IV, and Versed 5 mg IV TOPICAL ANESTHETIC: Cetacaine Spray  DESCRIPTION OF PROCEDURE: After Gabriel risks benefits and alternatives of Gabriel procedure were thoroughly explained, informed consent was obtained.  Gabriel Pentax Gastroscope [id number] endoscope was introduced through Gabriel mouth and advanced to Gabriel second portion of Gabriel duodenum , Without limitations.  Gabriel instrument was slowly withdrawn as Gabriel mucosa was fully examined.    1) 2 small clean-based duodenal bulb ulcers with surrounding edema 2) esophageal diverticulum just above GE junction 3) otherwise normal esophagus, stomach and duodenu, 4) antral biopsy x 2 for CLO test to look for H.  pylori. Retroflexed views revealed no abnormalities.     Gabriel scope was then withdrawn from Gabriel Garcia and Gabriel procedure completed.  COMPLICATIONS: There were no immediate complications.  ENDOSCOPIC IMPRESSION: 1) 2 small clean-based duodenal bulb ulcers with surrounding edema 2)  esophageal diverticulum just above GE junction 3) otherwise normal esophagus, stomach and duodenu, 4) antral biopsy x 2 for CLO test to look for H.  pylori  RECOMMENDATIONS: No NSAID's daily PPI x 2 months treat CLO test if + If requires NSAID in future take PPI daily ferrous sulfate supplements and f/u Hgb as outpatient - note he has been dismissed from our practice due to multiple no-shows so will need other f/u - does not have to have GI f/u of these issues if recovering w/o problems could be discharged later today or  tomorrow  eSigned:  Iva Booparl E Kaelan Emami, MD, [facility] 10/13/2014 11:52 AM    CC: Gabriel Garcia

## 2014-10-14 ENCOUNTER — Encounter (HOSPITAL_COMMUNITY): Payer: Self-pay | Admitting: Internal Medicine

## 2014-10-14 LAB — CLOTEST (H. PYLORI), BIOPSY: HELICOBACTER SCREEN: NEGATIVE

## 2015-01-04 ENCOUNTER — Emergency Department (HOSPITAL_COMMUNITY)
Admission: EM | Admit: 2015-01-04 | Discharge: 2015-01-04 | Disposition: A | Discharge status: Home / Self Care | Payer: Self-pay | Attending: Emergency Medicine | Admitting: Emergency Medicine

## 2015-01-04 ENCOUNTER — Encounter (HOSPITAL_COMMUNITY): Payer: Self-pay | Admitting: Emergency Medicine

## 2015-01-04 DIAGNOSIS — M10071 Idiopathic gout, right ankle and foot: Secondary | ICD-10-CM | POA: Insufficient documentation

## 2015-01-04 DIAGNOSIS — H548 Legal blindness, as defined in USA: Secondary | ICD-10-CM | POA: Insufficient documentation

## 2015-01-04 DIAGNOSIS — Z79899 Other long term (current) drug therapy: Secondary | ICD-10-CM | POA: Insufficient documentation

## 2015-01-04 DIAGNOSIS — M109 Gout, unspecified: Secondary | ICD-10-CM

## 2015-01-04 DIAGNOSIS — K219 Gastro-esophageal reflux disease without esophagitis: Secondary | ICD-10-CM | POA: Insufficient documentation

## 2015-01-04 DIAGNOSIS — Z8619 Personal history of other infectious and parasitic diseases: Secondary | ICD-10-CM | POA: Insufficient documentation

## 2015-01-04 DIAGNOSIS — I10 Essential (primary) hypertension: Secondary | ICD-10-CM | POA: Insufficient documentation

## 2015-01-04 MED ORDER — PREDNISONE 20 MG PO TABS: ORAL_TABLET | ORAL | Status: DC

## 2015-01-04 MED ORDER — HYDROCODONE-ACETAMINOPHEN 5-325 MG PO TABS
1.0000 | ORAL_TABLET | Freq: Once | ORAL | Status: AC
  Administered 2015-01-04: 1 via ORAL
  Filled 2015-01-04: qty 1

## 2015-01-04 MED ORDER — HYDROCODONE-ACETAMINOPHEN 5-325 MG PO TABS: 1.0000 | ORAL_TABLET | ORAL | Status: DC | PRN

## 2015-01-04 NOTE — ED Provider Notes (Signed)
CSN: [id number]     Arrival date & time 01/04/15  1504 History  This chart was scribed for non-physician practitioner, Trixie Dredge, PA-C,working with Purvis Sheffield, MD, by Karle Plumber, ED Scribe. This patient was seen in room WTR5/WTR5 and the patient's care was started at 3:19 PM.  Chief Complaint  Patient presents with  . Gout   The history is provided by the patient and medical records. No language interpreter was used.    HPI Comments:  Gabriel Garcia is a 39 y.o. male with PMHx of gout who presents to the Emergency Department complaining of severe right great toe pain that began yesterday. He states the pain has been gradually worsening since onset. Bending the toe or touching the area makes the pain worse. States this feels exactly like his prior gout symptoms.  Denies alleviating factors. Denies numbness, tingling or weakness of the right great toe or foot, fever, chills, trauma, injury or fall. PMHx of HTN, Hepatitis C, right eye blindness, substance abuse, GI bleed, mesenteric adenitis, acute duodenal ulcer.   Past Medical History  Diagnosis Date  . Hypertension   . Hepatitis C   . 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 Terminal ileitis   . Mesenteric adenitis   . Sliding hiatal hernia   . Substance abuse     cocaine  . GI bleed   . GERD (gastroesophageal reflux disease)   . Acute duodenal ulcer with hemorrhage 10/12/2014   Past Surgical History  Procedure Laterality Date  . Eye surgery Right   . Foot surgery Right   . Esophagogastroduodenoscopy N/A 10/13/2014    Procedure: ESOPHAGOGASTRODUODENOSCOPY (EGD);  Surgeon: Iva Boop, MD;  Location: [facility];  Service: Endoscopy;  Laterality: N/A;   Family History  Problem Relation Age of Onset  . Rheum arthritis Mother   . Hypertension Mother   . Heart failure Father   . Colon cancer Neg Hx    History  Substance Use Topics  . Smoking status: Never Smoker   . Smokeless tobacco: Never Used   . Alcohol Use: Yes     Comment: occasionally    Review of Systems  Constitutional: Negative for fever and chills.  Musculoskeletal: Positive for arthralgias. Negative for myalgias.  Skin: Positive for color change. Negative for wound.  Allergic/Immunologic: Positive for immunocompromised state.  Neurological: Negative for weakness and numbness.  Psychiatric/Behavioral: Negative for self-injury.    Allergies  Review of patient's allergies indicates no known allergies.  Home Medications   Prior to Admission medications   Medication Sig Start Date End Date Taking? Authorizing Provider  docusate sodium (COLACE) 100 MG capsule Take 1 capsule (100 mg total) by mouth 2 (two) times daily. 10/13/14   Calvert Cantor, MD  ferrous sulfate 325 (65 FE) MG tablet Take 1 tablet (325 mg total) by mouth 3 (three) times daily with meals. 10/13/14   Calvert Cantor, MD  lisinopril (PRINIVIL,ZESTRIL) 10 MG tablet Take 1 tablet (10 mg total) by mouth 2 (two) times daily. 07/06/14   Pricilla Loveless, MD  metoprolol tartrate (LOPRESSOR) 25 MG tablet Take 1 tablet (25 mg total) by mouth 2 (two) times daily. 07/06/14   Pricilla Loveless, MD  Oxygen Permeable Lens Products (RENU REWETTING DROPS) SOLN Place 1 drop into both eyes 3 (three) times daily as needed (dry eyes).    Historical Provider, MD  pantoprazole (PROTONIX) 40 MG injection Inject 40 mg into the vein daily. 10/13/14   Calvert Cantor, MD  pantoprazole (  PROTONIX) 40 MG tablet Take 1 tablet (40 mg total) by mouth daily. 10/09/14   Mancel BaleElliott Wentz, MD   Triage Vitals: BP 144/90 mmHg  Pulse 74  Temp(Src) 98 F (36.7 C) (Oral)  Resp 17  SpO2 100% Physical Exam  Constitutional: He appears well-developed and well-nourished. No distress.  HENT:  Head: Normocephalic and atraumatic.  Neck: Neck supple.  Cardiovascular:  Cap refill less than three seconds.  Pulmonary/Chest: Effort normal.  Musculoskeletal: He exhibits no edema.  Tenderness of 1st right MTP with  small area of erythema. Nodularity that is also tender. Full ROM. No other tenderness or edema.  Neurological: He is alert. He exhibits normal muscle tone.  Sensations intact.  Skin: He is not diaphoretic.  Psychiatric: He has a normal mood and affect. His behavior is normal.  Nursing note and vitals reviewed.   ED Course  Procedures (including critical care time) DIAGNOSTIC STUDIES: Oxygen Saturation is 100% on RA, normal by my interpretation.   COORDINATION OF CARE: 3:24 PM- Will prescribe pain medication. Pt verbalizes understanding and agrees to plan.  Medications - No data to display  Labs Review Labs Reviewed - No data to display  Imaging Review No results found.   EKG Interpretation None      MDM   Final diagnoses:  Acute gout of right foot, unspecified cause    Afebrile, nontoxic patient with hx gout with pain, redness over right 1st MTP c/w prior gout flare.  No injury.  No systemic symptoms, doubt septic joint.  Neurovascularly intact.  Pt with hx GI bleed and  Hemorrhage from duodenal ulcer - will not tx with NSAIDs.   D/C home with prednisone, norco, PCP follow up.  Discussed result, findings, treatment, and follow up  with patient.  Pt given return precautions.  Pt verbalizes understanding and agrees with plan.      I personally performed the services described in this documentation, which was scribed in my presence. The recorded information has been reviewed and is accurate.    Trixie Dredgemily Findley Blankenbaker, PA-C 01/04/15 1547  Purvis SheffieldForrest Harrison, MD 01/04/15 2217

## 2015-01-04 NOTE — Discharge Instructions (Signed)
Read the information below.  Use the prescribed medication as directed.  Please discuss all new medications with your pharmacist.  Do not take additional tylenol while taking the prescribed pain medication to avoid overdose.  You may return to the Emergency Department at any time for worsening condition or any new symptoms that concern you.   If you develop uncontrolled pain, weakness or numbness of the extremity, severe discoloration of the skin, or you are unable to walk or move your toe, return to the ER for a recheck.      Gout Gout is when your joints become red, sore, and swell (inflamed). This is caused by the buildup of uric acid crystals in the joints. Uric acid is a chemical that is normally in the blood. If the level of uric acid gets too high in the blood, these crystals form in your joints and tissues. Over time, these crystals can form into masses near the joints and tissues. These masses can destroy bone and cause the bone to look misshapen (deformed). HOME CARE   Do not take aspirin for pain.  Only take medicine as told by your doctor.  Rest the joint as much as you can. When in bed, keep sheets and blankets off painful areas.  Keep the sore joints raised (elevated).  Put warm or cold packs on painful joints. Use of warm or cold packs depends on which works best for you.  Use crutches if the painful joint is in your leg.  Drink enough fluids to keep your pee (urine) clear or pale yellow. Limit alcohol, sugary drinks, and drinks with fructose in them.  Follow your diet instructions. Pay careful attention to how much protein you eat. Include fruits, vegetables, whole grains, and fat-free or low-fat milk products in your daily diet. Talk to your doctor or dietitian about the use of coffee, vitamin C, and cherries. These may help lower uric acid levels.  Keep a healthy body weight. GET HELP RIGHT AWAY IF:   You have watery poop (diarrhea), throw up (vomit), or have any side  effects from medicines.  You do not feel better in 24 hours, or you are getting worse.  Your joint becomes suddenly more tender, and you have chills or a fever. MAKE SURE YOU:   Understand these instructions.  Will watch your condition.  Will get help right away if you are not doing well or get worse. Document Released: 07/09/2008 Document Revised: 02/14/2014 Document Reviewed: 05/13/2012 [facility] Patient Information 2015 [place], [place]. This information is not intended to replace advice given to you by your health care provider. Make sure you discuss any questions you have with your health care provider.  Low-Purine Diet Purines are compounds that affect the level of uric acid in your body. A low-purine diet is a diet that is low in purines. Eating a low-purine diet can prevent the level of uric acid in your body from getting too high and causing gout or kidney stones or both. WHAT DO I NEED TO KNOW ABOUT THIS DIET?  Choose low-purine foods. Examples of low-purine foods are listed in the next section.  Drink plenty of fluids, especially water. Fluids can help remove uric acid from your body. Try to drink 8-16 cups (1.9-3.8 L) a day.  Limit foods high in fat, especially saturated fat, as fat makes it harder for the body to get rid of uric acid. Foods high in saturated fat include pizza, cheese, ice cream, whole milk, fried foods, and gravies. Choose  foods that are lower in fat and lean sources of protein. Use olive oil when cooking as it contains healthy fats that are not high in saturated fat.  Limit alcohol. Alcohol interferes with the elimination of uric acid from your body. If you are having a gout attack, avoid all alcohol.  Keep in mind that different people's bodies react differently to different foods. You will probably learn over time which foods do or do not affect you. If you discover that a food tends to cause your gout to flare up, avoid eating that food. You can more freely  enjoy foods that do not cause problems. If you have any questions about a food item, talk to your dietitian or health care provider. WHICH FOODS ARE LOW, MODERATE, AND HIGH IN PURINES? The following is a list of foods that are low, moderate, and high in purines. You can eat any amount of the foods that are low in purines. You may be able to have small amounts of foods that are moderate in purines. Ask your health care provider how much of a food moderate in purines you can have. Avoid foods high in purines. Grains  Foods low in purines: Enriched white bread, pasta, rice, cake, cornbread, popcorn.  Foods moderate in purines: Whole-grain breads and cereals, wheat germ, bran, oatmeal. Uncooked oatmeal. Dry wheat bran or wheat germ.  Foods high in purines: Pancakes, [place] toast, biscuits, muffins. Vegetables  Foods low in purines: All vegetables, except those that are moderate in purines.  Foods moderate in purines: Asparagus, cauliflower, spinach, mushrooms, green peas. Fruits  All fruits are low in purines. Meats and other Protein Foods  Foods low in purines: Eggs, nuts, peanut butter.  Foods moderate in purines: 80-90% lean beef, lamb, veal, pork, poultry, fish, eggs, peanut butter, nuts. Crab, lobster, oysters, and shrimp. Cooked dried beans, peas, and lentils.  Foods high in purines: Anchovies, sardines, herring, mussels, tuna, codfish, scallops, trout, and haddock. Gabriel Garcia. Organ meats (such as liver or kidney). Tripe. Game meat. Goose. Sweetbreads. Dairy  All dairy foods are low in purines. Low-fat and fat-free dairy products are best because they are low in saturated fat. Beverages  Drinks low in purines: Water, carbonated beverages, tea, coffee, cocoa.  Drinks moderate in purines: Soft drinks and other drinks sweetened with high-fructose corn syrup. Juices. To find whether a food or drink is sweetened with high-fructose corn syrup, look at the ingredients list.  Drinks high in  purines: Alcoholic beverages (such as beer). Condiments  Foods low in purines: Salt, herbs, olives, pickles, relishes, vinegar.  Foods moderate in purines: Butter, margarine, oils, mayonnaise. Fats and Oils  Foods low in purines: All types, except gravies and sauces made with meat.  Foods high in purines: Gravies and sauces made with meat. Other Foods  Foods low in purines: Sugars, sweets, gelatin. Cake. Soups made without meat.  Foods moderate in purines: Meat-based or fish-based soups, broths, or bouillons. Foods and drinks sweetened with high-fructose corn syrup.  Foods high in purines: High-fat desserts (such as ice cream, cookies, cakes, pies, doughnuts, and chocolate). Contact your dietitian for more information on foods that are not listed here. Document Released: 01/25/2011 Document Revised: 10/05/2013 Document Reviewed: 09/06/2013 [facility] Patient Information 2015 [place], [place]. This information is not intended to replace advice given to you by your health care provider. Make sure you discuss any questions you have with your health care provider.

## 2015-01-04 NOTE — ED Notes (Signed)
Pt c/o pain, redden joint at right big toe that is increased since yesterday. Pt states that he has PMH gout and hurts worse with movement.

## 2015-01-08 ENCOUNTER — Emergency Department (HOSPITAL_COMMUNITY)
Admission: EM | Admit: 2015-01-08 | Discharge: 2015-01-08 | Disposition: A | Discharge status: Home / Self Care | Payer: Self-pay | Attending: Emergency Medicine | Admitting: Emergency Medicine

## 2015-01-08 ENCOUNTER — Encounter (HOSPITAL_COMMUNITY): Payer: Self-pay

## 2015-01-08 DIAGNOSIS — I10 Essential (primary) hypertension: Secondary | ICD-10-CM | POA: Insufficient documentation

## 2015-01-08 DIAGNOSIS — M10071 Idiopathic gout, right ankle and foot: Secondary | ICD-10-CM | POA: Insufficient documentation

## 2015-01-08 DIAGNOSIS — Z79899 Other long term (current) drug therapy: Secondary | ICD-10-CM | POA: Insufficient documentation

## 2015-01-08 DIAGNOSIS — M109 Gout, unspecified: Secondary | ICD-10-CM

## 2015-01-08 DIAGNOSIS — K219 Gastro-esophageal reflux disease without esophagitis: Secondary | ICD-10-CM | POA: Insufficient documentation

## 2015-01-08 DIAGNOSIS — Z8619 Personal history of other infectious and parasitic diseases: Secondary | ICD-10-CM | POA: Insufficient documentation

## 2015-01-08 DIAGNOSIS — H548 Legal blindness, as defined in USA: Secondary | ICD-10-CM | POA: Insufficient documentation

## 2015-01-08 MED ORDER — INDOMETHACIN 25 MG PO CAPS: 25.0000 mg | ORAL_CAPSULE | Freq: Three times a day (TID) | ORAL | Status: DC | PRN

## 2015-01-08 NOTE — ED Notes (Signed)
Onset 4 days ago gout flareup to right foot.  [facility] and was prescribed pain med and Prednisone.  Pt was referred to PCP for gout medicine.  Pt does not have PCP.

## 2015-01-08 NOTE — Discharge Instructions (Signed)
Gout °Gout is when your joints become red, sore, and swell (inflamed). This is caused by the buildup of uric acid crystals in the joints. Uric acid is a chemical that is normally in the blood. If the level of uric acid gets too high in the blood, these crystals form in your joints and tissues. Over time, these crystals can form into masses near the joints and tissues. These masses can destroy bone and cause the bone to look misshapen (deformed). °HOME CARE  °· Do not take aspirin for pain. °· Only take medicine as told by your doctor. °· Rest the joint as much as you can. When in bed, keep sheets and blankets off painful areas. °· Keep the sore joints raised (elevated). °· Put warm or cold packs on painful joints. Use of warm or cold packs depends on which works best for you. °· Use crutches if the painful joint is in your leg. °· Drink enough fluids to keep your pee (urine) clear or pale yellow. Limit alcohol, sugary drinks, and drinks with fructose in them. °· Follow your diet instructions. Pay careful attention to how much protein you eat. Include fruits, vegetables, whole grains, and fat-free or low-fat milk products in your daily diet. Talk to your doctor or dietitian about the use of coffee, vitamin C, and cherries. These may help lower uric acid levels. °· Keep a healthy body weight. °GET HELP RIGHT AWAY IF:  °· You have watery poop (diarrhea), throw up (vomit), or have any side effects from medicines. °· You do not feel better in 24 hours, or you are getting worse. °· Your joint becomes suddenly more tender, and you have chills or a fever. °MAKE SURE YOU:  °· Understand these instructions. °· Will watch your condition. °· Will get help right away if you are not doing well or get worse. °Document Released: 07/09/2008 Document Revised: 02/14/2014 Document Reviewed: 05/13/2012 °ExitCare® Patient Information ©2015 [employer], LLC. This information is not intended to replace advice given to you by your health care  provider. Make sure you discuss any questions you have with your health care provider. ° °

## 2015-01-08 NOTE — ED Provider Notes (Signed)
CSN: [id number]     Arrival date & time 01/08/15  1305 History  This chart was scribed for non-physician practitioner, Teressa LowerVrinda Ruba Outen, NP, working with Linwood DibblesJon Knapp, MD, by Lionel DecemberHatice Demirci, ED Scribe. This patient was seen in room TR07C/TR07C and the patient's care was started at 1:22 PM.   First MD Initiated Contact with Patient 01/08/15 1309     No chief complaint on file.    (Consider location/radiation/quality/duration/timing/severity/associated sxs/prior Treatment) The history is provided by the patient. No language interpreter was used.    HPI Comments: Gabriel Garcia is a 39 y.o. male who presents to the Emergency Department complaining of a right foot gout flare up four days ago.  [facility] but states that it does not help him at all and is asking for Gout medication.  Patient states that he still has the gout medication he was given here in the past at home but it has now expired.  Patient has no other complaints today.    Past Medical History  Diagnosis Date  . Hypertension   . Hepatitis C   . 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 Terminal ileitis   . Mesenteric adenitis   . Sliding hiatal hernia   . Substance abuse     cocaine  . GI bleed   . GERD (gastroesophageal reflux disease)   . Acute duodenal ulcer with hemorrhage 10/12/2014   Past Surgical History  Procedure Laterality Date  . Eye surgery Right   . Foot surgery Right   . Esophagogastroduodenoscopy N/A 10/13/2014    Procedure: ESOPHAGOGASTRODUODENOSCOPY (EGD);  Surgeon: Iva Booparl E Gessner, MD;  [facility];  Service: Endoscopy;  Laterality: N/A;   Family History  Problem Relation Age of Onset  . Rheum arthritis Mother   . Hypertension Mother   . Heart failure Father   . Colon cancer Neg Hx    History  Substance Use Topics  . Smoking status: Never Smoker   . Smokeless tobacco: Never Used  . Alcohol Use: Yes     Comment: occasionally     Review of Systems  Musculoskeletal: Positive for arthralgias.  All other systems reviewed and are negative.     Allergies  Review of patient's allergies indicates no known allergies.  Home Medications   Prior to Admission medications   Medication Sig Start Date End Date Taking? Authorizing Provider  docusate sodium (COLACE) 100 MG capsule Take 1 capsule (100 mg total) by mouth 2 (two) times daily. 10/13/14   Calvert CantorSaima Rizwan, MD  ferrous sulfate 325 (65 FE) MG tablet Take 1 tablet (325 mg total) by mouth 3 (three) times daily with meals. 10/13/14   Calvert CantorSaima Rizwan, MD  HYDROcodone-acetaminophen (NORCO/VICODIN) 5-325 MG per tablet Take 1-2 tablets by mouth every 4 (four) hours as needed for moderate pain or severe pain. 01/04/15   Trixie DredgeEmily West, PA-C  lisinopril (PRINIVIL,ZESTRIL) 10 MG tablet Take 1 tablet (10 mg total) by mouth 2 (two) times daily. 07/06/14   Pricilla LovelessScott Goldston, MD  metoprolol tartrate (LOPRESSOR) 25 MG tablet Take 1 tablet (25 mg total) by mouth 2 (two) times daily. 07/06/14   Pricilla LovelessScott Goldston, MD  Oxygen Permeable Lens Products (RENU REWETTING DROPS) SOLN Place 1 drop into both eyes 3 (three) times daily as needed (dry eyes).    Historical Provider, MD  pantoprazole (PROTONIX) 40 MG injection Inject 40 mg into the vein daily. 10/13/14   Calvert CantorSaima Rizwan, MD  pantoprazole (PROTONIX)  40 MG tablet Take 1 tablet (40 mg total) by mouth daily. 10/09/14   Mancel Bale, MD  predniSONE (DELTASONE) 20 MG tablet 3 tabs po day one, then 2 tabs daily x 4 days 01/04/15   Trixie Dredge, PA-C   There were no vitals taken for this visit. Physical Exam  Constitutional: He is oriented to person, place, and time. He appears well-developed and well-nourished. No distress.  HENT:  Head: Normocephalic and atraumatic.  Eyes: Conjunctivae and EOM are normal.  Neck: Neck supple.  Cardiovascular: Normal rate.   Pulmonary/Chest: Effort normal. No respiratory distress.  Musculoskeletal: Normal range of motion.   Red and warmth noted to the right great toe. Full rom. Tender to palpation  Neurological: He is alert and oriented to person, place, and time.  Skin: Skin is warm and dry.  Psychiatric: He has a normal mood and affect. His behavior is normal.  Nursing note and vitals reviewed.   ED Course  Procedures (including critical care time) DIAGNOSTIC STUDIES: Oxygen Saturation is 100% on RA, normal by my interpretation.    COORDINATION OF CARE: 1:25 PM Discussed treatment plan with patient at beside, the patient agrees with the plan and has no further questions at this time.   Labs Review Labs Reviewed - No data to display  Imaging Review No results found.   EKG Interpretation None      MDM   Final diagnoses:  Gout of big toe   Pt given indocin for gout. Discussed importance of pcp and pt states he is waiting for insurance  I personally performed the services described in this documentation, which was scribed in my presence. The recorded information has been reviewed and is accurate.    Teressa Lower, NP 01/08/15 1334  Linwood Dibbles, MD 01/10/15 [phone]

## 2015-01-16 ENCOUNTER — Emergency Department (HOSPITAL_COMMUNITY)
Admission: EM | Admit: 2015-01-16 | Discharge: 2015-01-16 | Disposition: A | Discharge status: Home / Self Care | Payer: Self-pay | Attending: Emergency Medicine | Admitting: Emergency Medicine

## 2015-01-16 ENCOUNTER — Encounter (HOSPITAL_COMMUNITY): Payer: Self-pay | Admitting: Emergency Medicine

## 2015-01-16 DIAGNOSIS — K219 Gastro-esophageal reflux disease without esophagitis: Secondary | ICD-10-CM | POA: Insufficient documentation

## 2015-01-16 DIAGNOSIS — H548 Legal blindness, as defined in USA: Secondary | ICD-10-CM | POA: Insufficient documentation

## 2015-01-16 DIAGNOSIS — I10 Essential (primary) hypertension: Secondary | ICD-10-CM | POA: Insufficient documentation

## 2015-01-16 DIAGNOSIS — Z791 Long term (current) use of non-steroidal anti-inflammatories (NSAID): Secondary | ICD-10-CM | POA: Insufficient documentation

## 2015-01-16 DIAGNOSIS — Z79899 Other long term (current) drug therapy: Secondary | ICD-10-CM | POA: Insufficient documentation

## 2015-01-16 DIAGNOSIS — M109 Gout, unspecified: Secondary | ICD-10-CM | POA: Insufficient documentation

## 2015-01-16 DIAGNOSIS — Z8619 Personal history of other infectious and parasitic diseases: Secondary | ICD-10-CM | POA: Insufficient documentation

## 2015-01-16 LAB — I-STAT CHEM 8, ED
BUN: 17 mg/dL (ref 6–23)
CHLORIDE: 104 mmol/L (ref 96–112)
CREATININE: 1.5 mg/dL — AB (ref 0.50–1.35)
Calcium, Ion: 1.23 mmol/L (ref 1.12–1.23)
GLUCOSE: 101 mg/dL — AB (ref 70–99)
HEMATOCRIT: 35 % — AB (ref 39.0–52.0)
Hemoglobin: 11.9 g/dL — ABNORMAL LOW (ref 13.0–17.0)
Potassium: 4 mmol/L (ref 3.5–5.1)
SODIUM: 141 mmol/L (ref 135–145)
TCO2: 22 mmol/L (ref 0–100)

## 2015-01-16 MED ORDER — COLCHICINE 0.6 MG PO TABS: ORAL_TABLET | ORAL | Status: DC

## 2015-01-16 MED ORDER — PREDNISONE 20 MG PO TABS: 40.0000 mg | ORAL_TABLET | Freq: Every day | ORAL | Status: DC

## 2015-01-16 MED ORDER — OXYCODONE-ACETAMINOPHEN 5-325 MG PO TABS
2.0000 | ORAL_TABLET | ORAL | Status: DC | PRN
Start: 2015-01-16 — End: 2015-05-17

## 2015-01-16 MED ORDER — OXYCODONE-ACETAMINOPHEN 5-325 MG PO TABS
1.0000 | ORAL_TABLET | Freq: Once | ORAL | Status: AC
  Administered 2015-01-16: 1 via ORAL
  Filled 2015-01-16: qty 1

## 2015-01-16 MED ORDER — COLCHICINE 0.6 MG PO TABS
1.2000 mg | ORAL_TABLET | ORAL | Status: AC
  Administered 2015-01-16: 1.2 mg via ORAL
  Filled 2015-01-16: qty 2

## 2015-01-16 MED ORDER — HYDROCODONE-ACETAMINOPHEN 5-325 MG PO TABS
1.0000 | ORAL_TABLET | ORAL | Status: DC | PRN
Start: 2015-01-16 — End: 2015-01-16

## 2015-01-16 NOTE — ED Provider Notes (Signed)
CSN: [id number]     Arrival date & time 01/16/15  1538 History  This chart was scribed for non-physician practitioner, Harle Battiest, working with Rolland Porter, MD by Richarda Overlie, ED Scribe. This patient was seen in room WTR6/WTR6 and the patient's care was started at 4:45 PM.   Chief Complaint  Patient presents with  . Gout    R great toe   The history is provided by the patient. No language interpreter was used.   HPI Comments: Jeramey Lanuza is a 39 y.o. male with a history of HTN, hepatitis C, GERD and substance abuse who presents to the Emergency Department complaining of gout flare up in his right great toe that started 2 weeks ago and worsened 2 days ago. Pt states that he was seen about 2 week ago and was prescribed prednisone and vicodin. He states that these medications provided him some temporary relief. Pt states a week ago he went to [facility] 1 week ago and was prescribed gout medication that provided him relief. He says that he last took his gout medication this morning. He states that his current episode is similar to his prior episodes and states that he typically has 1 flare up per year. Pt denies any history of kidney disease. He denies fever, chills, nausea and vomiting.   Past Medical History  Diagnosis Date  . Hypertension   . Hepatitis C   . 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 Terminal ileitis   . Mesenteric adenitis   . Sliding hiatal hernia   . Substance abuse     cocaine  . GI bleed   . GERD (gastroesophageal reflux disease)   . Acute duodenal ulcer with hemorrhage 10/12/2014   Past Surgical History  Procedure Laterality Date  . Eye surgery Right   . Foot surgery Right   . Esophagogastroduodenoscopy N/A 10/13/2014    Procedure: ESOPHAGOGASTRODUODENOSCOPY (EGD);  Surgeon: Iva Boop, MD;  Location: [facility];  Service: Endoscopy;  Laterality: N/A;   Family History  Problem Relation Age of Onset  . Rheum arthritis Mother   .  Hypertension Mother   . Heart failure Father   . Colon cancer Neg Hx    History  Substance Use Topics  . Smoking status: Never Smoker   . Smokeless tobacco: Never Used  . Alcohol Use: No    Review of Systems  Musculoskeletal: Positive for arthralgias.  All other systems reviewed and are negative.   Allergies  Review of patient's allergies indicates no known allergies.  Home Medications   Prior to Admission medications   Medication Sig Start Date End Date Taking? Authorizing Provider  docusate sodium (COLACE) 100 MG capsule Take 1 capsule (100 mg total) by mouth 2 (two) times daily. 10/13/14   Calvert Cantor, MD  ferrous sulfate 325 (65 FE) MG tablet Take 1 tablet (325 mg total) by mouth 3 (three) times daily with meals. 10/13/14   Calvert Cantor, MD  HYDROcodone-acetaminophen (NORCO/VICODIN) 5-325 MG per tablet Take 1-2 tablets by mouth every 4 (four) hours as needed for moderate pain or severe pain. 01/04/15   Trixie Dredge, PA-C  indomethacin (INDOCIN) 25 MG capsule Take 1 capsule (25 mg total) by mouth 3 (three) times daily as needed. 01/08/15   Teressa Lower, NP  lisinopril (PRINIVIL,ZESTRIL) 10 MG tablet Take 1 tablet (10 mg total) by mouth 2 (two) times daily. 07/06/14   Pricilla Loveless, MD  metoprolol tartrate (LOPRESSOR) 25 MG tablet Take 1 tablet (  25 mg total) by mouth 2 (two) times daily. 07/06/14   Pricilla LovelessScott Goldston, MD  Oxygen Permeable Lens Products (RENU REWETTING DROPS) SOLN Place 1 drop into both eyes 3 (three) times daily as needed (dry eyes).    Historical Provider, MD  pantoprazole (PROTONIX) 40 MG injection Inject 40 mg into the vein daily. 10/13/14   Calvert CantorSaima Rizwan, MD  pantoprazole (PROTONIX) 40 MG tablet Take 1 tablet (40 mg total) by mouth daily. 10/09/14   Mancel BaleElliott Wentz, MD  predniSONE (DELTASONE) 20 MG tablet 3 tabs po day one, then 2 tabs daily x 4 days 01/04/15   Trixie DredgeEmily West, PA-C   BP 154/94 mmHg  Pulse 83  Temp(Src) 97.9 F (36.6 C) (Oral)  Resp 20  Ht 5\' 10"   (1.778 m)  Wt 205 lb (92.987 kg)  BMI 29.41 kg/m2  SpO2 97% Physical Exam  Constitutional: He is oriented to person, place, and time. He appears well-developed and well-nourished.  HENT:  Head: Normocephalic and atraumatic.  Eyes: Right eye exhibits no discharge. Left eye exhibits no discharge.  Neck: No tracheal deviation present.  Cardiovascular: Normal rate.   Pulmonary/Chest: Effort normal. No respiratory distress.  Abdominal: He exhibits no distension.  Musculoskeletal:  Redness, warmth and swelling to the left MCP on the right foot.   Neurological: He is alert and oriented to person, place, and time.  Skin: Skin is warm and dry.  Psychiatric: He has a normal mood and affect. His behavior is normal.  Nursing note and vitals reviewed.   ED Course  Procedures   DIAGNOSTIC STUDIES: Oxygen Saturation is 97% on RA, normal by my interpretation.    COORDINATION OF CARE: 4:45 PM Discussed treatment plan with pt at bedside and pt agreed to plan.   Labs Review Labs Reviewed - No data to display  Imaging Review No results found.   EKG Interpretation None      MDM   Final diagnoses:  Gout of big toe   39 yo with monoarticular pain, swelling and erythema of left great toe.  Pt is afebrile and stable.  I-stat8 checked, mildly elevated creatinine from last analysis consistent with recent indomethacin therapy.  Discussed case with Dr. Fayrene FearingJames. Prescriptions for colchicine, prednisone and pain meds provided and treatment plan discussed with pt.  Discussed that pt should respond to treatment with in 24 hour of begining treatment & likely resolve in 2-3 days. Resources provided to establish care with PCP. Pt is well-appearing, in no acute distress and vital signs reviewed and not concerning. She appears safe to be discharged.  Return precautions provided. Pt aware of plan and in agreement.   I personally performed the services described in this documentation, which was scribed in my  presence. The recorded information has been reviewed and is accurate.  Filed Vitals:   01/16/15 1549 01/16/15 1621 01/16/15 1807  BP: 145/96 154/94   Pulse: 87 83 84  Temp: 98.4 F (36.9 C) 97.9 F (36.6 C)   TempSrc: Oral Oral   Resp: 14 20 18   Height:  5\' 10"  (1.778 m)   Weight:  205 lb (92.987 kg)   SpO2: 98% 97% 99%   Meds given in ED:  Medications  oxyCODONE-acetaminophen (PERCOCET/ROXICET) 5-325 MG per tablet 1 tablet (1 tablet Oral Given 01/16/15 1710)  colchicine tablet 1.2 mg (1.2 mg Oral Given 01/16/15 1803)    Discharge Medication List as of 01/16/2015  5:35 PM    START taking these medications   Details  colchicine 0.6 MG  tablet BID x 5 days, Print        01/16/15 1733   01/16/15 0000  oxyCODONE-acetaminophen (PERCOCET/ROXICET) 5-325 MG per tablet Every 4 hours PRN Discontinue Reprint 01/16/15 1811   01/16/15 0000  predniSONE (DELTASONE) 20 MG tablet Daily Discontinue Reprint 01/16/15 1812            Harle Battiest, NP 01/18/15 1610  Rolland Porter, MD 01/22/15 [phone]

## 2015-01-16 NOTE — ED Notes (Signed)
Pt called for triage in all parts of lobby, pt not present in lobby.

## 2015-01-16 NOTE — ED Notes (Signed)
Pt A+ox4, reports gout pain to R great toe, reports [facility] in ED here [facility], given medications "but it's not getting any better".  Ambulatory with steady gait.  Speaking full/clear sentences.  NAD.

## 2015-01-16 NOTE — Discharge Instructions (Signed)
Please follow the directions provided. Be sure to follow-up with your primary care doctor for further management of your gout pain. Please finish taking the indomethacin you have at home. Please start taking the colchicine twice a day for 5 days. Please take the prednisone daily for 5 days. You may use the Vicodin for pain not relieved by the first 3 medicines. Don't hesitate to return for any new, worsening, or concerning symptoms.   SEEK MEDICAL CARE IF:  You develop diarrhea, vomiting, or any side effects from medicines.  You do not feel better in 24 hours, or you are getting worse. SEEK IMMEDIATE MEDICAL CARE IF:  Your joint becomes suddenly more tender, and you have chills or a fever.

## 2015-05-17 ENCOUNTER — Encounter (HOSPITAL_COMMUNITY): Payer: Self-pay

## 2015-05-17 ENCOUNTER — Emergency Department (HOSPITAL_COMMUNITY)
Admission: EM | Admit: 2015-05-17 | Discharge: 2015-05-18 | Disposition: A | Discharge status: Home / Self Care | Payer: Worker's Compensation | Attending: Emergency Medicine | Admitting: Emergency Medicine

## 2015-05-17 DIAGNOSIS — I10 Essential (primary) hypertension: Secondary | ICD-10-CM | POA: Diagnosis not present

## 2015-05-17 DIAGNOSIS — K219 Gastro-esophageal reflux disease without esophagitis: Secondary | ICD-10-CM | POA: Diagnosis not present

## 2015-05-17 DIAGNOSIS — S4991XA Unspecified injury of right shoulder and upper arm, initial encounter: Secondary | ICD-10-CM | POA: Diagnosis present

## 2015-05-17 DIAGNOSIS — Y9389 Activity, other specified: Secondary | ICD-10-CM | POA: Diagnosis not present

## 2015-05-17 DIAGNOSIS — Y9289 Other specified places as the place of occurrence of the external cause: Secondary | ICD-10-CM | POA: Diagnosis not present

## 2015-05-17 DIAGNOSIS — Z7952 Long term (current) use of systemic steroids: Secondary | ICD-10-CM | POA: Insufficient documentation

## 2015-05-17 DIAGNOSIS — W228XXA Striking against or struck by other objects, initial encounter: Secondary | ICD-10-CM | POA: Diagnosis not present

## 2015-05-17 DIAGNOSIS — K260 Acute duodenal ulcer with hemorrhage: Secondary | ICD-10-CM | POA: Diagnosis not present

## 2015-05-17 DIAGNOSIS — H548 Legal blindness, as defined in USA: Secondary | ICD-10-CM | POA: Diagnosis not present

## 2015-05-17 DIAGNOSIS — Z8619 Personal history of other infectious and parasitic diseases: Secondary | ICD-10-CM | POA: Diagnosis not present

## 2015-05-17 DIAGNOSIS — Z79899 Other long term (current) drug therapy: Secondary | ICD-10-CM | POA: Diagnosis not present

## 2015-05-17 DIAGNOSIS — S40021A Contusion of right upper arm, initial encounter: Secondary | ICD-10-CM | POA: Diagnosis not present

## 2015-05-17 DIAGNOSIS — Y998 Other external cause status: Secondary | ICD-10-CM | POA: Insufficient documentation

## 2015-05-17 MED ORDER — IBUPROFEN 800 MG PO TABS
800.0000 mg | ORAL_TABLET | Freq: Once | ORAL | Status: AC
  Administered 2015-05-17: 800 mg via ORAL
  Filled 2015-05-17: qty 1

## 2015-05-17 MED ORDER — OXYCODONE-ACETAMINOPHEN 5-325 MG PO TABS: 1.0000 | ORAL_TABLET | ORAL | Status: DC | PRN

## 2015-05-17 MED ORDER — OXYCODONE-ACETAMINOPHEN 5-325 MG PO TABS
1.0000 | ORAL_TABLET | Freq: Once | ORAL | Status: AC
  Administered 2015-05-17: 1 via ORAL
  Filled 2015-05-17: qty 1

## 2015-05-17 NOTE — ED Notes (Signed)
Patient reports he would like a note for work.

## 2015-05-17 NOTE — ED Provider Notes (Signed)
CSN: [id number]     Arrival date & time 05/17/15  2313 History  This chart was scribed for non-physician practitioner, Elpidio Anis, PA-C working with Marisa Severin, MD by Doreatha Martin, ED scribe. This patient was seen in room WTR8/WTR8 and the patient's care was started at 11:38 PM    Chief Complaint  Patient presents with  . Arm Injury   The history is provided by the patient. No language interpreter was used.    HPI Comments: Gabriel Garcia is a 39 y.o. male who presents to the Emergency Department complaining of moderate right upper arm pain onset this evening after being hit with a pressure washing wand proximal to the antecubital fossa. Pt states the pain is worsened with movement and flexion of the arm. No treatments tried PTA. NKDA. He denies numbness or other injuries.   Past Medical History  Diagnosis Date  . Hypertension   . Hepatitis C   . 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 Terminal ileitis   . Mesenteric adenitis   . Sliding hiatal hernia   . Substance abuse     cocaine  . GI bleed   . GERD (gastroesophageal reflux disease)   . Acute duodenal ulcer with hemorrhage 10/12/2014   Past Surgical History  Procedure Laterality Date  . Eye surgery Right   . Foot surgery Right   . Esophagogastroduodenoscopy N/A 10/13/2014    Procedure: ESOPHAGOGASTRODUODENOSCOPY (EGD);  Surgeon: Iva Boop, MD;  Location: [facility];  Service: Endoscopy;  Laterality: N/A;   Family History  Problem Relation Age of Onset  . Rheum arthritis Mother   . Hypertension Mother   . Heart failure Father   . Colon cancer Neg Hx    History  Substance Use Topics  . Smoking status: Never Smoker   . Smokeless tobacco: Never Used  . Alcohol Use: No    Review of Systems  Musculoskeletal: Positive for myalgias and arthralgias.  Skin: Positive for wound.  Neurological: Negative for numbness.   Allergies  Review of patient's allergies indicates no known allergies.  Home  Medications   Prior to Admission medications   Medication Sig Start Date End Date Taking? Authorizing Provider  colchicine 0.6 MG tablet BID x 5 days 01/17/15 01/21/15  Harle Battiest, NP  docusate sodium (COLACE) 100 MG capsule Take 1 capsule (100 mg total) by mouth 2 (two) times daily. 10/13/14   Calvert Cantor, MD  ferrous sulfate 325 (65 FE) MG tablet Take 1 tablet (325 mg total) by mouth 3 (three) times daily with meals. 10/13/14   Calvert Cantor, MD  indomethacin (INDOCIN) 25 MG capsule Take 1 capsule (25 mg total) by mouth 3 (three) times daily as needed. 01/08/15   Teressa Lower, NP  lisinopril (PRINIVIL,ZESTRIL) 10 MG tablet Take 1 tablet (10 mg total) by mouth 2 (two) times daily. 07/06/14   Pricilla Loveless, MD  metoprolol tartrate (LOPRESSOR) 25 MG tablet Take 1 tablet (25 mg total) by mouth 2 (two) times daily. 07/06/14   Pricilla Loveless, MD  oxyCODONE-acetaminophen (PERCOCET/ROXICET) 5-325 MG per tablet Take 2 tablets by mouth every 4 (four) hours as needed for severe pain. 01/16/15   Harle Battiest, NP  Oxygen Permeable Lens Products (RENU REWETTING DROPS) SOLN Place 1 drop into both eyes 3 (three) times daily as needed (dry eyes).    Historical Provider, MD  pantoprazole (PROTONIX) 40 MG injection Inject 40 mg into the vein daily. 10/13/14   Calvert Cantor, MD  pantoprazole (  PROTONIX) 40 MG tablet Take 1 tablet (40 mg total) by mouth daily. 10/09/14   Mancel Bale, MD  predniSONE (DELTASONE) 20 MG tablet Take 2 tablets (40 mg total) by mouth daily. 01/16/15   Harle Battiest, NP   BP 175/98 mmHg  Pulse 71  Temp(Src) 98.3 F (36.8 C) (Oral)  Resp 18  Ht  (1.778 m)  Wt 195 lb (88.451 kg)  BMI 27.98 kg/m2  SpO2 100% Physical Exam  Constitutional: He is oriented to person, place, and time. He appears well-developed and well-nourished.  HENT:  Head: Normocephalic and atraumatic.  Eyes: Conjunctivae and EOM are normal. Pupils are equal, round, and reactive to light.  Neck:  Normal range of motion. Neck supple.  Cardiovascular: Normal rate.   Pulmonary/Chest: Effort normal. No respiratory distress.  Abdominal: He exhibits no distension.  Musculoskeletal: Normal range of motion.  There is a palpable hematoma to the base of the bicep. No muscle deformity to suggest rupture. FROM pf all joints of the upper extremity. Distal pulses intact.   Neurological: He is alert and oriented to person, place, and time.  Skin: Skin is warm and dry.  Small area of bruising over the right upper extremity. Skin integrity intact.   Psychiatric: He has a normal mood and affect. His behavior is normal.  Nursing note and vitals reviewed.  ED Course  Procedures (including critical care time) DIAGNOSTIC STUDIES: Oxygen Saturation is 100% on RA, normal by my interpretation.    COORDINATION OF CARE: 11:38 PM Discussed treatment plan with pt at bedside and pt agreed to plan.   Labs Review Labs Reviewed - No data to display  Imaging Review No results found.   EKG Interpretation None      MDM   Final diagnoses:  None    1. Right UE hematoma  No suspicion for fracture injury. No injection injury, skin intact. He has FROM and no muscle body deformity to suggest ruptured tendon. Suspect injury limited to hematoma requiring pain control.  I personally performed the services described in this documentation, which was scribed in my presence. The recorded information has been reviewed and is accurate.     Elpidio Anis, PA-C 05/17/15 2351  Marisa Severin, MD 05/18/15 [phone]

## 2015-05-17 NOTE — Discharge Instructions (Signed)
Cryotherapy °Cryotherapy means treatment with cold. Ice or gel packs can be used to reduce both pain and swelling. Ice is the most helpful within the first 24 to 48 hours after an injury or flare-up from overusing a muscle or joint. Sprains, strains, spasms, burning pain, shooting pain, and aches can all be eased with ice. Ice can also be used when recovering from surgery. Ice is effective, has very few side effects, and is safe for most people to use. °PRECAUTIONS  °Ice is not a safe treatment option for people with: °· Raynaud phenomenon. This is a condition affecting small blood vessels in the extremities. Exposure to cold may cause your problems to return. °· Cold hypersensitivity. There are many forms of cold hypersensitivity, including: °· Cold urticaria. Red, itchy hives appear on the skin when the tissues begin to warm after being iced. °· Cold erythema. This is a red, itchy rash caused by exposure to cold. °· Cold hemoglobinuria. Red blood cells break down when the tissues begin to warm after being iced. The hemoglobin that carry oxygen are passed into the urine because they cannot combine with blood proteins fast enough. °· Numbness or altered sensitivity in the area being iced. °If you have any of the following conditions, do not use ice until you have discussed cryotherapy with your caregiver: °· Heart conditions, such as arrhythmia, angina, or chronic heart disease. °· High blood pressure. °· Healing wounds or open skin in the area being iced. °· Current infections. °· Rheumatoid arthritis. °· Poor circulation. °· Diabetes. °Ice slows the blood flow in the region it is applied. This is beneficial when trying to stop inflamed tissues from spreading irritating chemicals to surrounding tissues. However, if you expose your skin to cold temperatures for too long or without the proper protection, you can damage your skin or nerves. Watch for signs of skin damage due to cold. °HOME CARE INSTRUCTIONS °Follow  these tips to use ice and cold packs safely. °· Place a dry or damp towel between the ice and skin. A damp towel will cool the skin more quickly, so you may need to shorten the time that the ice is used. °· For a more rapid response, add gentle compression to the ice. °· Ice for no more than 10 to 20 minutes at a time. The bonier the area you are icing, the less time it will take to get the benefits of ice. °· Check your skin after 5 minutes to make sure there are no signs of a poor response to cold or skin damage. °· Rest 20 minutes or more between uses. °· Once your skin is numb, you can end your treatment. You can test numbness by very lightly touching your skin. The touch should be so light that you do not see the skin dimple from the pressure of your fingertip. When using ice, most people will feel these normal sensations in this order: cold, burning, aching, and numbness. °· Do not use ice on someone who cannot communicate their responses to pain, such as small children or people with dementia. °HOW TO MAKE AN ICE PACK °Ice packs are the most common way to use ice therapy. Other methods include ice massage, ice baths, and cryosprays. Muscle creams that cause a cold, tingly feeling do not offer the same benefits that ice offers and should not be used as a substitute unless recommended by your caregiver. °To make an ice pack, do one of the following: °· Place crushed ice or a   bag of frozen vegetables in a sealable plastic bag. Squeeze out the excess air. Place this bag inside another plastic bag. Slide the bag into a pillowcase or place a damp towel between your skin and the bag. °· Mix 3 parts water with 1 part rubbing alcohol. Freeze the mixture in a sealable plastic bag. When you remove the mixture from the freezer, it will be slushy. Squeeze out the excess air. Place this bag inside another plastic bag. Slide the bag into a pillowcase or place a damp towel between your skin and the bag. °SEEK MEDICAL CARE  IF: °· You develop white spots on your skin. This may give the skin a blotchy (mottled) appearance. °· Your skin turns blue or pale. °· Your skin becomes waxy or hard. °· Your swelling gets worse. °MAKE SURE YOU:  °· Understand these instructions. °· Will watch your condition. °· Will get help right away if you are not doing well or get worse. °Document Released: 05/27/2011 Document Revised: 02/14/2014 Document Reviewed: 05/27/2011 °ExitCare® Patient Information ©2015 [employer]. This information is not intended to replace advice given to you by your health care provider. Make sure you discuss any questions you have with your health care provider. ° °Hematoma °A hematoma is a collection of blood under the skin, in an organ, in a body space, in a joint space, or in other tissue. The blood can clot to form a lump that you can see and feel. The lump is often firm and may sometimes become sore and tender. Most hematomas get better in a few days to weeks. However, some hematomas may be serious and require medical care. Hematomas can range in size from very small to very large. °CAUSES  °A hematoma can be caused by a blunt or penetrating injury. It can also be caused by spontaneous leakage from a blood vessel under the skin. Spontaneous leakage from a blood vessel is more likely to occur in older people, especially those taking blood thinners. Sometimes, a hematoma can develop after certain medical procedures. °SIGNS AND SYMPTOMS  °· A firm lump on the body. °· Possible pain and tenderness in the area. °· Bruising. Blue, dark blue, purple-red, or yellowish skin may appear at the site of the hematoma if the hematoma is close to the surface of the skin. °For hematomas in deeper tissues or body spaces, the signs and symptoms may be subtle. For example, an intra-abdominal hematoma may cause abdominal pain, weakness, fainting, and shortness of breath. An intracranial hematoma may cause a headache or symptoms such as  weakness, trouble speaking, or a change in consciousness. °DIAGNOSIS  °A hematoma can usually be diagnosed based on your medical history and a physical exam. Imaging tests may be needed if your health care provider suspects a hematoma in deeper tissues or body spaces, such as the abdomen, head, or chest. These tests may include ultrasonography or a CT scan.  °TREATMENT  °Hematomas usually go away on their own over time. Rarely does the blood need to be drained out of the body. Large hematomas or those that may affect vital organs will sometimes need surgical drainage or monitoring. °HOME CARE INSTRUCTIONS  °· Apply ice to the injured area:   °¨ Put ice in a plastic bag.   °¨ Place a towel between your skin and the bag.   °¨ Leave the ice on for 20 minutes, 2-3 times a day for the first 1 to 2 days.   °· After the first 2 days, switch to using warm compresses   on the hematoma.   °· Elevate the injured area to help decrease pain and swelling. Wrapping the area with an elastic bandage may also be helpful. Compression helps to reduce swelling and promotes shrinking of the hematoma. Make sure the bandage is not wrapped too tight.   °· If your hematoma is on a lower extremity and is painful, crutches may be helpful for a couple days.   °· Only take over-the-counter or prescription medicines as directed by your health care provider. °SEEK IMMEDIATE MEDICAL CARE IF:  °· You have increasing pain, or your pain is not controlled with medicine.   °· You have a fever.   °· You have worsening swelling or discoloration.   °· Your skin over the hematoma breaks or starts bleeding.   °· Your hematoma is in your chest or abdomen and you have weakness, shortness of breath, or a change in consciousness. °· Your hematoma is on your scalp (caused by a fall or injury) and you have a worsening headache or a change in alertness or consciousness. °MAKE SURE YOU:  °· Understand these instructions. °· Will watch your condition. °· Will get help  right away if you are not doing well or get worse. °Document Released: 05/14/2004 Document Revised: 06/02/2013 Document Reviewed: 03/10/2013 °ExitCare® Patient Information ©2015 [employer]. This information is not intended to replace advice given to you by your health care provider. Make sure you discuss any questions you have with your health care provider. ° °

## 2015-05-17 NOTE — ED Notes (Signed)
Patient reports he was pressure washing this evening when the wand fell onto his right arm.  He reports the pain has increased and now his arm is throbbing.  Limited ROM.  No obvious deformity.

## 2015-05-22 ENCOUNTER — Encounter (HOSPITAL_COMMUNITY): Payer: Self-pay

## 2015-05-22 ENCOUNTER — Emergency Department (HOSPITAL_COMMUNITY): Payer: Worker's Compensation

## 2015-05-22 ENCOUNTER — Emergency Department (HOSPITAL_COMMUNITY)
Admission: EM | Admit: 2015-05-22 | Discharge: 2015-05-22 | Disposition: A | Discharge status: Home / Self Care | Payer: Worker's Compensation | Attending: Emergency Medicine | Admitting: Emergency Medicine

## 2015-05-22 DIAGNOSIS — Z79899 Other long term (current) drug therapy: Secondary | ICD-10-CM | POA: Diagnosis not present

## 2015-05-22 DIAGNOSIS — Y9389 Activity, other specified: Secondary | ICD-10-CM | POA: Diagnosis not present

## 2015-05-22 DIAGNOSIS — K219 Gastro-esophageal reflux disease without esophagitis: Secondary | ICD-10-CM | POA: Diagnosis not present

## 2015-05-22 DIAGNOSIS — Y998 Other external cause status: Secondary | ICD-10-CM | POA: Diagnosis not present

## 2015-05-22 DIAGNOSIS — H18822 Corneal disorder due to contact lens, left eye: Secondary | ICD-10-CM | POA: Diagnosis not present

## 2015-05-22 DIAGNOSIS — I10 Essential (primary) hypertension: Secondary | ICD-10-CM | POA: Insufficient documentation

## 2015-05-22 DIAGNOSIS — S40021A Contusion of right upper arm, initial encounter: Secondary | ICD-10-CM

## 2015-05-22 DIAGNOSIS — Y9289 Other specified places as the place of occurrence of the external cause: Secondary | ICD-10-CM | POA: Diagnosis not present

## 2015-05-22 DIAGNOSIS — Z7952 Long term (current) use of systemic steroids: Secondary | ICD-10-CM | POA: Insufficient documentation

## 2015-05-22 DIAGNOSIS — Z8619 Personal history of other infectious and parasitic diseases: Secondary | ICD-10-CM | POA: Insufficient documentation

## 2015-05-22 DIAGNOSIS — H548 Legal blindness, as defined in USA: Secondary | ICD-10-CM | POA: Insufficient documentation

## 2015-05-22 DIAGNOSIS — W208XXA Other cause of strike by thrown, projected or falling object, initial encounter: Secondary | ICD-10-CM | POA: Diagnosis not present

## 2015-05-22 DIAGNOSIS — S4991XA Unspecified injury of right shoulder and upper arm, initial encounter: Secondary | ICD-10-CM | POA: Diagnosis present

## 2015-05-22 MED ORDER — FLUORESCEIN SODIUM 1 MG OP STRP
1.0000 | ORAL_STRIP | Freq: Once | OPHTHALMIC | Status: AC
  Administered 2015-05-22: 1 via OPHTHALMIC
  Filled 2015-05-22: qty 1

## 2015-05-22 MED ORDER — TOBRAMYCIN 0.3 % OP SOLN: 2.0000 [drp] | Freq: Four times a day (QID) | OPHTHALMIC | Status: DC

## 2015-05-22 MED ORDER — OXYCODONE-ACETAMINOPHEN 5-325 MG PO TABS
2.0000 | ORAL_TABLET | Freq: Once | ORAL | Status: AC
  Administered 2015-05-22: 2 via ORAL
  Filled 2015-05-22: qty 2

## 2015-05-22 MED ORDER — TETRACAINE HCL 0.5 % OP SOLN
2.0000 [drp] | Freq: Once | OPHTHALMIC | Status: AC
  Administered 2015-05-22: 2 [drp] via OPHTHALMIC
  Filled 2015-05-22: qty 2

## 2015-05-22 MED ORDER — TRAMADOL HCL 50 MG PO TABS: 50.0000 mg | ORAL_TABLET | Freq: Four times a day (QID) | ORAL | Status: DC | PRN

## 2015-05-22 NOTE — Discharge Instructions (Signed)
Stop wearing your contact of your left eye.  It is important that you follow up with Optometrist to get new contacts.  Call tomorrow to set up an appointment.  Your contact is most likely irritating your eye and may have caused a small scratch to the eye.   Take pain medication as prescribed for your arm pain.  Do not drive or operate heavy machinery for 4-6 hours after.  Take Ibuprofen for pain.

## 2015-05-22 NOTE — ED Notes (Signed)
Patient states a pressure washer wand fell approx 12 foot onto the patient's right upper arm 5 days ago. Patient states he can not straighten his rightarm out. Patient has bruising and slight swelling present. Patient also c/o blurred vision left eye.

## 2015-05-22 NOTE — ED Notes (Signed)
Pt offered ice, declined.

## 2015-05-22 NOTE — ED Notes (Signed)
Ortho aware of order. 

## 2015-05-22 NOTE — ED Provider Notes (Signed)
CSN: [id number]     Arrival date & time 05/22/15  1749 History  This chart was scribed for Santiago Glad, PA-C, working with Blake Divine, MD by Chestine Spore, ED Scribe. The patient was seen in room WTR9/WTR9 at [facility].    Chief Complaint  Patient presents with  . Eye Problem  . Arm Pain      The history is provided by the patient. No language interpreter was used.    HPI Comments: Helio Lack is a 39 y.o. male with a medical hx of HTN and Hepatitis C who presents to the Emergency Department complaining of right upper arm pain onset 5 days ago. Pt notes that he had thrown the pressure washer wand on top of a 10 foot high truck and that is when it fell onto his right upper arm. Pt states that he was seen in the ED for his symptoms on 05/16/15 where he was Rx pain medication without an x-ray of his right arm. Pt notes that he is unable to straighten his right arm since the incident without significant pain. He states that he is having associated symptoms of mild swelling. He states that he has tried Rx percocet with no relief for his symptoms. He denies numbness or tingling.  Pt secondingly complains of left eye redness with associated blurred vision onset Thursday night. Pt reports that his vision is foggy and he has a FB sensation to his left eye. Pt has been using OTC eye drops to flush out the sensation. Pt denies a FB in his left eye or injury to his left eye.  Pt currently has his contact lense in. He states that he has not changed his contacts for 8 months.  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 Pt denies eye pain, thick drainage, fever, chills, and any other symptoms.   Past Medical History  Diagnosis Date  . Hypertension   . Hepatitis C   . 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 Terminal ileitis   . Mesenteric adenitis   . Sliding hiatal hernia   . Substance abuse     cocaine  . GI bleed   . GERD (gastroesophageal reflux  disease)   . Acute duodenal ulcer with hemorrhage 10/12/2014   Past Surgical History  Procedure Laterality Date  . Eye surgery Right   . Foot surgery Right   . Esophagogastroduodenoscopy N/A 10/13/2014    Procedure: ESOPHAGOGASTRODUODENOSCOPY (EGD);  Surgeon: Iva Boop, MD;  Location: [facility];  Service: Endoscopy;  Laterality: N/A;   Family History  Problem Relation Age of Onset  . Rheum arthritis Mother   . Hypertension Mother   . Heart failure Father   . Colon cancer Neg Hx    History  Substance Use Topics  . Smoking status: Never Smoker   . Smokeless tobacco: Never Used  . Alcohol Use: No    Review of Systems  Eyes: Positive for redness and visual disturbance. Negative for photophobia, pain, discharge and itching.       Watery left eye  Musculoskeletal: Positive for joint swelling and arthralgias.  Skin: Positive for color change. Negative for rash and wound.      Allergies  Review of patient's allergies indicates no known allergies.  Home Medications   Prior to Admission medications   Medication Sig Start Date End Date Taking? Authorizing Provider  colchicine 0.6 MG tablet BID x 5 days 01/17/15 01/21/15  Harle Battiest, NP  docusate sodium (COLACE) 100 MG capsule Take 1 capsule (100 mg total) by mouth 2 (two) times daily. 10/13/14   Calvert Cantor, MD  ferrous sulfate 325 (65 FE) MG tablet Take 1 tablet (325 mg total) by mouth 3 (three) times daily with meals. 10/13/14   Calvert Cantor, MD  indomethacin (INDOCIN) 25 MG capsule Take 1 capsule (25 mg total) by mouth 3 (three) times daily as needed. 01/08/15   Teressa Lower, NP  lisinopril (PRINIVIL,ZESTRIL) 10 MG tablet Take 1 tablet (10 mg total) by mouth 2 (two) times daily. 07/06/14   Pricilla Loveless, MD  metoprolol tartrate (LOPRESSOR) 25 MG tablet Take 1 tablet (25 mg total) by mouth 2 (two) times daily. 07/06/14   Pricilla Loveless, MD  oxyCODONE-acetaminophen (ROXICET) 5-325 MG per tablet Take 1 tablet by  mouth every 4 (four) hours as needed for severe pain. 05/17/15   Elpidio Anis, PA-C  Oxygen Permeable Lens Products (RENU REWETTING DROPS) SOLN Place 1 drop into both eyes 3 (three) times daily as needed (dry eyes).    Historical Provider, MD  pantoprazole (PROTONIX) 40 MG injection Inject 40 mg into the vein daily. 10/13/14   Calvert Cantor, MD  pantoprazole (PROTONIX) 40 MG tablet Take 1 tablet (40 mg total) by mouth daily. 10/09/14   Mancel Bale, MD  predniSONE (DELTASONE) 20 MG tablet Take 2 tablets (40 mg total) by mouth daily. 01/16/15   Harle Battiest, NP   BP 140/97 mmHg  Pulse 74  Temp(Src) 97.7 F (36.5 C) (Oral)  Resp 16  Ht 5\' 10"  (1.778 m)  Wt 195 lb (88.451 kg)  BMI 27.98 kg/m2  SpO2 100% Physical Exam  Constitutional: He is oriented to person, place, and time. He appears well-developed and well-nourished. No distress.  HENT:  Head: Normocephalic and atraumatic.  Eyes: EOM are normal. Lids are everted and swept, no foreign bodies found. Left conjunctiva is injected. Left pupil is round and reactive.  Slit lamp exam:      The left eye shows fluorescein uptake (small over left cornea).  Left pupil ERRL, blind in right pupil. Diffuse injection of the left eye.   Visual Acuity  Right Eye Distance: blind Left Eye Distance: 20/30 Bilateral Distance: 20/30     Neck: Neck supple. No tracheal deviation present.  Cardiovascular: Normal rate, regular rhythm and normal heart sounds.  Exam reveals no gallop and no friction rub.   No murmur heard. Pulses:      Radial pulses are 2+ on the right side.  Pulmonary/Chest: Effort normal and breath sounds normal. No respiratory distress. He has no wheezes. He has no rales.  Musculoskeletal: Normal range of motion.  Pain with ROM to the right elbow. Large bruise of the right medial upper arm. Full ROM of right shoulder.  Neurological: He is alert and oriented to person, place, and time.  Distal sensation of right hand intact  Skin:  Skin is warm and dry.  Psychiatric: He has a normal mood and affect. His behavior is normal.  Nursing note and vitals reviewed.   ED Course  Procedures (including critical care time) DIAGNOSTIC STUDIES: Oxygen Saturation is 100% on RA, nl by my interpretation.    COORDINATION OF CARE: 6:48 PM Discussed treatment plan with pt at bedside and pt agreed to plan.   Labs Review Labs Reviewed - No data to display  Imaging Review Dg Humerus Right  05/22/2015   CLINICAL DATA:  Status post fall.  EXAM: RIGHT HUMERUS -  2+ VIEW  COMPARISON:  None.  FINDINGS: There is no evidence of fracture or other focal bone lesions. Soft tissues are unremarkable.  IMPRESSION: Negative.   Electronically Signed   By: Elige Ko   On: 05/22/2015 19:14     EKG Interpretation None      MDM   Final diagnoses:  None   Patient presents today with persistent right upper arm pain that has been present since an injury five days ago.  Patient with a large bruise present. Xray today negative.  Patient neurovascularly intact.  Patient given sling for comfort.  Patient also presents today with redness of his left eye.  No eye pain.  He reports that he has been wearing the same disposable contacts for the past 8 months, which I suspect is irritating his eye.  Stain showed very small area of Fluorescein uptake.  Patient could have a small corneal abrasion.  Patient started on abx drops and instructed to no longer wear those contacts or any contacts for 2 weeks or until infection has healed.  Patient stable for discharge.  Return precautions given.    I personally performed the services described in this documentation, which was scribed in my presence. The recorded information has been reviewed and is accurate.    Santiago Glad, PA-C 05/23/15 2224  Blake Divine, MD 05/25/15 [phone]

## 2015-11-16 ENCOUNTER — Ambulatory Visit (INDEPENDENT_AMBULATORY_CARE_PROVIDER_SITE_OTHER): Payer: 59 | Admitting: Family Medicine

## 2015-11-16 ENCOUNTER — Encounter: Payer: Self-pay | Admitting: Family Medicine

## 2015-11-16 VITALS — BP 138/80 | HR 64 | Temp 97.7°F | Ht 70.0 in | Wt 215.0 lb

## 2015-11-16 DIAGNOSIS — R0683 Snoring: Secondary | ICD-10-CM

## 2015-11-16 DIAGNOSIS — F1020 Alcohol dependence, uncomplicated: Secondary | ICD-10-CM

## 2015-11-16 DIAGNOSIS — F1410 Cocaine abuse, uncomplicated: Secondary | ICD-10-CM

## 2015-11-16 DIAGNOSIS — R358 Other polyuria: Secondary | ICD-10-CM | POA: Diagnosis not present

## 2015-11-16 DIAGNOSIS — I10 Essential (primary) hypertension: Secondary | ICD-10-CM

## 2015-11-16 DIAGNOSIS — D62 Acute posthemorrhagic anemia: Secondary | ICD-10-CM | POA: Diagnosis not present

## 2015-11-16 DIAGNOSIS — Z20828 Contact with and (suspected) exposure to other viral communicable diseases: Secondary | ICD-10-CM

## 2015-11-16 DIAGNOSIS — K260 Acute duodenal ulcer with hemorrhage: Secondary | ICD-10-CM

## 2015-11-16 DIAGNOSIS — H548 Legal blindness, as defined in USA: Secondary | ICD-10-CM | POA: Insufficient documentation

## 2015-11-16 DIAGNOSIS — M10071 Idiopathic gout, right ankle and foot: Secondary | ICD-10-CM

## 2015-11-16 DIAGNOSIS — F1411 Cocaine abuse, in remission: Secondary | ICD-10-CM | POA: Insufficient documentation

## 2015-11-16 DIAGNOSIS — B182 Chronic viral hepatitis C: Secondary | ICD-10-CM | POA: Diagnosis not present

## 2015-11-16 DIAGNOSIS — F1021 Alcohol dependence, in remission: Secondary | ICD-10-CM | POA: Insufficient documentation

## 2015-11-16 DIAGNOSIS — R3589 Other polyuria: Secondary | ICD-10-CM

## 2015-11-16 DIAGNOSIS — M109 Gout, unspecified: Secondary | ICD-10-CM | POA: Insufficient documentation

## 2015-11-16 LAB — COMPREHENSIVE METABOLIC PANEL
ALT: 147 U/L — ABNORMAL HIGH (ref 0–53)
AST: 76 U/L — AB (ref 0–37)
Albumin: 4.3 g/dL (ref 3.5–5.2)
Alkaline Phosphatase: 58 U/L (ref 39–117)
BUN: 14 mg/dL (ref 6–23)
CALCIUM: 9.8 mg/dL (ref 8.4–10.5)
CO2: 30 meq/L (ref 19–32)
CREATININE: 0.98 mg/dL (ref 0.40–1.50)
Chloride: 105 mEq/L (ref 96–112)
GFR: 90.39 mL/min (ref >60.00)
Glucose, Bld: 91 mg/dL (ref 70–99)
Potassium: 4.8 mEq/L (ref 3.5–5.1)
Sodium: 141 mEq/L (ref 135–145)
Total Bilirubin: 0.5 mg/dL (ref 0.2–1.2)
Total Protein: 7.8 g/dL (ref 6.0–8.3)

## 2015-11-16 LAB — CBC
HEMATOCRIT: 42.4 % (ref 39.0–52.0)
HEMOGLOBIN: 13.9 g/dL (ref 13.0–17.0)
MCHC: 32.8 g/dL (ref 30.0–36.0)
MCV: 92.4 fl (ref 78.0–100.0)
PLATELETS: 218 10*3/uL (ref 150.0–400.0)
RBC: 4.59 Mil/uL (ref 4.22–5.81)
RDW: 16.4 % — ABNORMAL HIGH (ref 11.5–15.5)
WBC: 5.2 10*3/uL (ref 4.0–10.5)

## 2015-11-16 LAB — POC URINALSYSI DIPSTICK (AUTOMATED)
Bilirubin, UA: NEGATIVE
Blood, UA: NEGATIVE
GLUCOSE UA: NEGATIVE
Ketones, UA: NEGATIVE
Leukocytes, UA: NEGATIVE
NITRITE UA: NEGATIVE
Protein, UA: NEGATIVE
Spec Grav, UA: 1.015
UROBILINOGEN UA: 1
pH, UA: 7

## 2015-11-16 LAB — URIC ACID: Uric Acid, Serum: 8.4 mg/dL — ABNORMAL HIGH (ref 4.0–7.8)

## 2015-11-16 LAB — HEMOGLOBIN A1C: Hgb A1c MFr Bld: 5.4 % (ref 4.6–6.5)

## 2015-11-16 NOTE — Assessment & Plan Note (Signed)
S: gets an attack in right great toe at least every 2 years A/P: check uric acid, consider allopurinol but may move forward with hepatitis treatment/eval first

## 2015-11-16 NOTE — Assessment & Plan Note (Signed)
Doing great with NA- 90 days clean

## 2015-11-16 NOTE — Assessment & Plan Note (Signed)
Clean since 2016. NA helping- continue

## 2015-11-16 NOTE — Progress Notes (Signed)
Tana Conch, MD Phone: [phone]  Subjective:  Patient presents today to establish care. Last cared for in [place] around 2013.  Chief complaint-noted.   See problem oriented charting  The following were reviewed and entered/updated in epic: Past Medical History  Diagnosis Date  . Hypertension   . Hepatitis C   . 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 Terminal ileitis (HCC)   . Mesenteric adenitis   . Sliding hiatal hernia   . Substance abuse     cocaine  . GI bleed   . GERD (gastroesophageal reflux disease)   . Acute duodenal ulcer with hemorrhage 10/12/2014  . Legally blind in right eye, as defined in [place]    Patient Active Problem List   Diagnosis Date Noted  . History of cocaine abuse 11/16/2015    Priority: High  . Alcoholism (HCC) 11/16/2015    Priority: High  . Hepatitis C     Priority: High  . Gout 11/16/2015    Priority: Medium  . Acute duodenal ulcer with hemorrhage 10/12/2014    Priority: Medium  . Acute blood loss anemia 10/12/2014    Priority: Medium  . Hypertension 10/06/2014    Priority: Medium  . Legally blind in right eye, as defined in [place]     Priority: Low  . Esophageal reflux 11/24/2013    Priority: Low  . EKG abnormality 10/12/2014   Past Surgical History  Procedure Laterality Date  . Eye surgery Right   . Foot surgery Right   . Esophagogastroduodenoscopy N/A 10/13/2014    Procedure: ESOPHAGOGASTRODUODENOSCOPY (EGD);  Surgeon: Iva Boop, MD;  Location: [facility];  Service: Endoscopy;  Laterality: N/A;  . Liver biopsy      Dr. Kinnie Scales GI    Family History  Problem Relation Age of Onset  . Rheum arthritis Mother   . Hypertension Mother   . Heart failure Father     rheumatic heart disease, also drinking and smoking  . Colon cancer Neg Hx   . Alcoholism Father     died age 74  . Alcoholism Mother     Medications- reviewed and updated Current Outpatient Prescriptions  Medication Sig Dispense  Refill  . lisinopril (PRINIVIL,ZESTRIL) 10 MG tablet Take 1 tablet (10 mg total) by mouth 2 (two) times daily. 30 tablet 1   Allergies-reviewed and updated No Known Allergies  Social History   Social History  . Marital Status: Single    Spouse Name: N/A  . Number of Children: 1  . Years of Education: N/A   Occupational History  . dye cutter helper    Social History Main Topics  . Smoking status: Never Smoker   . Smokeless tobacco: Never Used  . Alcohol Use: No  . Drug Use: No  . Sexual Activity: Not Asked   Other Topics Concern  . None   Social History Narrative   Single.  Lives with his mother.  1 daughter 84 years old in June 2017.       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 Going to concerts    ROS--Full ROS was completed Review of Systems  Constitutional: Negative for fever and chills.  HENT: Negative for hearing loss.   Eyes: Negative for blurred vision and double vision.       Blind in right eye   Respiratory: Negative for cough and hemoptysis.   Cardiovascular: Negative for chest pain and palpitations.  Gastrointestinal: Negative for heartburn and nausea.  Genitourinary: Positive for urgency and frequency. Negative for dysuria, hematuria and flank pain.  Musculoskeletal: Negative for myalgias.  Skin: Negative for itching and rash.  Neurological: Negative for dizziness, tingling and headaches.  Endo/Heme/Allergies: Negative for polydipsia. Does not bruise/bleed easily.  Psychiatric/Behavioral: Negative for hallucinations and substance abuse.   Objective: BP 138/80 mmHg  Pulse 64  Temp(Src) 97.7 F (36.5 C)  Ht  (1.778 m)  Wt 215 lb (97.523 kg)  BMI 30.85 kg/m2 Gen: NAD, resting comfortably HEENT: Mucous membranes are moist. Oropharynx normal. TM normal. Eyes: sclera and lids normal, PERRLA Neck: no thyromegaly, no cervical lymphadenopathy CV: RRR no murmurs rubs or gallops Lungs: CTAB no crackles,  wheeze, rhonchi Abdomen: soft/nontender/nondistended/normal bowel sounds. No rebound or guarding.  Rectal: normal tone, normal size prostate, no masses or tenderness Ext: no edema Skin: warm, dry Neuro: 5/5 strength in upper and lower extremities, normal gait, normal reflexes  Assessment/Plan:  History of cocaine abuse Doing great with NA- 90 days clean  Hepatitis C S: Recovering addict. Diagnosed in 2003 at a treatment facility. In 2014 desired treatment, was referred but something happened with referral. Apparently had liver biopsy as well A/P: Get records from Dr. Kinnie Scales for biopsy and ultrasound. Check Hepatitis panel essentially. May need A and B immunization. Likely refer to infectious disease for Hepatitis C treatment once results back   Alcoholism (HCC) Clean since 2016. NA helping- continue  Hypertension S: controlled. On lisinopril   BP Readings from Last 3 Encounters:  11/16/15 138/80  05/22/15 159/99  05/17/15 175/98  A/P:Continue current medications as at [facility]. Prefer lower- will monitor   Gout S: gets an attack in right great toe at least every 2 years A/P: check uric acid, consider allopurinol but may move forward with hepatitis treatment/eval first    Snoring /daytime sleepiness- S: brings a video of him snoring heavily that girlfriend took. Appears to have several prolonged pauses in his breathing of 10-15 seconds and then breathes heavily afterwards. He states if he is not moving in the daytime he has a tendency to nod off. Never seems to get restful sleep- then again also appears to only sleep 5 hours A/P:  Plan: Ambulatory referral to Pulmonology for sleep apnea testing  Polyuria S: 6 months. Not worsening. Pees about once an hour. No dysuria or penile discharge. Even if he drinks less fluid still seems to happen. Has had rare incontinence when sleeping.  A/P: check UA, a1c, bmet. Consider STD testing if no obvious cause. Prostate not large on exam but  could consider flomax trial vs. Urology  Return in about 6 months (around 05/15/2016). Return precautions advised.   Orders Placed This Encounter  Procedures  . Comprehensive metabolic panel    [place]  . Hepatitis C antibody, reflex    [facility]  . CBC    [place]  . Hemoglobin A1c    [facility]  . HIV antibody    [facility]  . Uric Acid  . Hepatitis B surface antigen  . Hepatitis B surface antibody  . Hepatitis B core antibody, total  . Hepatitis A antibody, total  . Hepatitis A antibody, IgM  . Ambulatory referral to Pulmonology    Referral Priority:  Routine    Referral Type:  Consultation    Referral Reason:  Specialty Services Required    Requested Specialty:  Pulmonary Disease    Number of Visits Requested:  1  . POCT Urinalysis Dipstick (Automated)   >  50% of 45 minute office visit was spent on counseling (need for hepatitis and sleep apnea follow up, steps to evaluate hepatitis and discussing newer treatment options, counseling for continued cessation from drugs/alcohol) and coordination of care

## 2015-11-16 NOTE — Assessment & Plan Note (Signed)
S: controlled. On lisinopril   BP Readings from Last 3 Encounters:  11/16/15 138/80  05/22/15 159/99  05/17/15 175/98  A/P:Continue current medications as [facility]. Prefer lower- will monitor

## 2015-11-16 NOTE — Patient Instructions (Addendum)
Sign release of information at the check out desk for records from Dr. Kinnie Scales  We will call you within a week about your referral to pulmonology for sleep apnea testing. If you do not hear within 2 weeks, give [place] a call.   Get labs- likely refer to infectious disease for treatment of hepatitis C  Frequent urination- unclear cause- get labwork and we will let you know  Hopeful we will have all your labwork back by 11/29/15 but I may let you know some portions of it sooner

## 2015-11-16 NOTE — Assessment & Plan Note (Signed)
S: Recovering addict. Diagnosed in 2003 at a treatment facility. In 2014 desired treatment, was referred but something happened with referral. Apparently had liver biopsy as well A/P: Get records from Dr. Kinnie Scales for biopsy and ultrasound. Check Hepatitis panel essentially. May need A and B immunization. Likely refer to infectious disease for Hepatitis C treatment once results back

## 2015-11-17 ENCOUNTER — Telehealth: Payer: Self-pay | Admitting: Pulmonary Disease

## 2015-11-17 ENCOUNTER — Ambulatory Visit (INDEPENDENT_AMBULATORY_CARE_PROVIDER_SITE_OTHER): Payer: 59 | Admitting: Pulmonary Disease

## 2015-11-17 ENCOUNTER — Encounter: Payer: Self-pay | Admitting: Pulmonary Disease

## 2015-11-17 VITALS — BP 148/88 | HR 73 | Ht 70.0 in | Wt 217.0 lb

## 2015-11-17 DIAGNOSIS — G4710 Hypersomnia, unspecified: Secondary | ICD-10-CM

## 2015-11-17 DIAGNOSIS — R0683 Snoring: Secondary | ICD-10-CM | POA: Diagnosis not present

## 2015-11-17 DIAGNOSIS — E669 Obesity, unspecified: Secondary | ICD-10-CM

## 2015-11-17 DIAGNOSIS — I10 Essential (primary) hypertension: Secondary | ICD-10-CM | POA: Diagnosis not present

## 2015-11-17 LAB — HEPATITIS A ANTIBODY, IGM: Hep A IgM: NONREACTIVE

## 2015-11-17 LAB — HEPATITIS B SURFACE ANTIGEN: Hepatitis B Surface Ag: NEGATIVE

## 2015-11-17 LAB — HEPATITIS C ANTIBODY: HCV AB: REACTIVE — AB

## 2015-11-17 LAB — HEPATITIS B CORE ANTIBODY, TOTAL: Hep B Core Total Ab: NONREACTIVE

## 2015-11-17 LAB — HEPATITIS B SURFACE ANTIBODY,QUALITATIVE: Hep B S Ab: NEGATIVE

## 2015-11-17 LAB — HIV ANTIBODY (ROUTINE TESTING W REFLEX): HIV 1&2 Ab, 4th Generation: NONREACTIVE

## 2015-11-17 LAB — HEPATITIS A ANTIBODY, TOTAL: Hep A Total Ab: REACTIVE — AB

## 2015-11-17 NOTE — Assessment & Plan Note (Signed)
53M, with hypersomnia, snoring, fatigue, choking, gasping. Mallampati III-IV. Does shift work. ETOH and cocaine free x 3 mos. 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 Has HTN -- dxed when he was early 30s.   ESS is 13.   We discussed about the diagnosis of Obstructive Sleep Apnea (OSA) and implications of untreated OSA. We discussed about CPAP and BiPaP as possible treatment options.   We will schedule the patient for a Home Sleep study -- young, not a lot of co morbidities, has [employer].    Patient was instructed to call the office if he/she has not heard back from the office 1-2 weeks after the sleep study.   Patient was instructed to call the office if he/she is having issues with the PAP device.   We discussed good sleep hygiene.   Patient was advised not to engage in activities requiring concentration and/or vigilance if he/she is sleepy.  Patient was advised not to drive if he/she is sleepy.

## 2015-11-17 NOTE — Telephone Encounter (Signed)
Will forward to [facility] as Dr. Christene Slates opens schedule, pt's can be added.

## 2015-11-17 NOTE — Assessment & Plan Note (Signed)
BP is controlled on meds.  Could be related to untreated OSA. Will observe BP patter on cpap.

## 2015-11-17 NOTE — Progress Notes (Signed)
Subjective:    Patient ID: Gabriel Garcia, male    DOB: 24-Jul-1976, 40 y.o.   MRN: [id number]   This is a 40 y.o.  year old Male  who presents for sleep consultation. Patient was referred by Dr. Tana Conch for consultation regarding possible OSA, hypersomnia, snoring.   He complains of hypersomnia, snoring, fatigue, excessive daytime sleepiness, gasping, choking, witnessed apneas.  Symptoms began 3 years ago but have gotten worse the last several months.  Patient has also gained 20 lbs the last month or so.  His girlfriend has observed apneas and she took a video and he showed it to me.    Patient's ESS is 13.    He works 2nd shift -- from 3 until 11 pm.  He gets sleepy sometimes but he does not fall asleep during work.  He also denies issues while driving back home from work.    Pt has frequent awakenings at night. He has to go to bathroom frequently.  He ends up sleeping 4-5 hrs over the night since he wakes up frequently.   Has sleep paralysis. Occasional sleep talking, nothing recent. No sleep walking. No other abnormal behavior in sleep.   No history of OSA in family.  Denies pain meds.   He has been sober from cocaine and ETOH x 3 mos.     Past Medical History  Diagnosis Date  . Hypertension   . Hepatitis C   . 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 Terminal ileitis (HCC)   . Mesenteric adenitis   . Sliding hiatal hernia   . Substance abuse     cocaine  . GI bleed   . GERD (gastroesophageal reflux disease)   . Acute duodenal ulcer with hemorrhage 10/12/2014  . Legally blind in right eye, as defined in [place]      Family History  Problem Relation Age of Onset  . Rheum arthritis Mother   . Hypertension Mother   . Heart failure Father     rheumatic heart disease, also drinking and smoking  . Colon cancer Neg Hx   . Alcoholism Father     died age 33  . Alcoholism Mother      Past Surgical History  Procedure Laterality Date  . Eye surgery Right    . Foot surgery Right   . Esophagogastroduodenoscopy N/A 10/13/2014    Procedure: ESOPHAGOGASTRODUODENOSCOPY (EGD);  Surgeon: Iva Boop, MD;  Location: [facility];  Service: Endoscopy;  Laterality: N/A;  . Liver biopsy      Dr. Kinnie Scales GI    Social History   Social History  . Marital Status: Single    Spouse Name: N/A  . Number of Children: 1  . Years of Education: N/A   Occupational History  . dye cutter helper    Social History Main Topics  . Smoking status: Never Smoker   . Smokeless tobacco: Never Used  . Alcohol Use: No     Comment: etoh free since 08/2015.  . Drug Use: No     Comment: used cocaine before but has been clean since 08/2015.   [name] Sexual Activity: Not on file   Other Topics Concern  . Not on file   Social History Narrative   Single.  Lives with his mother.  1 daughter 56 years old in June 2017.       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  Going to concerts     No Known Allergies   Outpatient Prescriptions Prior to Visit  Medication Sig Dispense Refill  . lisinopril (PRINIVIL,ZESTRIL) 10 MG tablet Take 1 tablet (10 mg total) by mouth 2 (two) times daily. 30 tablet 1   No facility-administered medications prior to visit.   Meds ordered this encounter  Medications  . ibuprofen (ADVIL,MOTRIN) 200 MG tablet    Sig: Take 200 mg by mouth every 6 (six) hours as needed.     HPI Review of Systems  Constitutional: Negative.  Negative for fever and unexpected weight change.  HENT: Positive for congestion. Negative for dental problem, ear pain, nosebleeds, postnasal drip, rhinorrhea, sinus pressure, sneezing, sore throat and trouble swallowing.   Eyes: Negative.  Negative for redness and itching.  Respiratory: Positive for apnea and choking. Negative for cough, chest tightness, shortness of breath and wheezing.   Cardiovascular: Negative.  Negative for palpitations and leg swelling.  Gastrointestinal:  Negative.  Negative for nausea and vomiting.  Endocrine: Negative.   Genitourinary: Negative.  Negative for dysuria.  Musculoskeletal: Negative.  Negative for joint swelling.  Skin: Negative.  Negative for rash.  Allergic/Immunologic: Negative.   Neurological: Negative.  Negative for headaches.  Hematological: Negative.  Does not bruise/bleed easily.  Psychiatric/Behavioral: Negative.  Negative for dysphoric mood. The patient is not nervous/anxious.   All other systems reviewed and are negative.      Objective:   Physical Exam Constitutional/General:  Pleasant, well-nourished, well-developed, not in any distress,  Comfortably seating.  Well kempt  HEENT: Pupils equal and reactive to light and accommodation. Anicteric sclerae. Normal nasal mucosa.   No oral  lesions,  mouth clear,  oropharynx clear, no postnasal drip. (-) Oral thrush. No dental caries.  Airway - Mallampati class -IV  Neck: No masses. Midline trachea. No JVD, (-) LAD. (-) bruits appreciated.  Respiratory/Chest: Grossly normal chest. (-) deformity. (-) Accessory muscle use.  Symmetric expansion. (-) Tenderness on palpation.  Resonant on percussion.  Diminished BS on both lower lung zones. (-) wheezing, crackles, rhonchi (-) egophony  Cardiovascular: Regular rate and  rhythm, heart sounds normal, no murmur or gallops, no peripheral edema  Gastrointestinal:  Normal bowel sounds. Soft, non-tender. No hepatosplenomegaly.  (-) masses.   Musculoskeletal:  Normal muscle tone. Normal gait.   Extremities: Grossly normal. (-) clubbing, cyanosis.  (-) edema  Skin: (-) rash,lesions seen.   Neurological/Psychiatric : alert, oriented to time, place, person. Normal mood and affect           Assessment & Plan:  Hypersomnia 69M, with hypersomnia, snoring, fatigue, choking, gasping. Mallampati III-IV. Does shift work. ETOH and cocaine free x 3 mos. 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 Has HTN -- dxed when he was early 30s.    ESS is 13.   We discussed about the diagnosis of Obstructive Sleep Apnea (OSA) and implications of untreated OSA. We discussed about CPAP and BiPaP as possible treatment options.   We will schedule the patient for a Home Sleep study -- young, not a lot of co morbidities, has [employer].    Patient was instructed to call the office if he/she has not heard back from the office 1-2 weeks after the sleep study.   Patient was instructed to call the office if he/she is having issues with the PAP device.   We discussed good sleep hygiene.   Patient was advised not to engage in activities requiring concentration and/or vigilance if he/she is sleepy.  Patient was advised not to  drive if he/she is sleepy.     Snoring 39 M with hypersomnia, snoring, fatigue, choking, gasping. Has mallampati class III-IV.  Plan: 1. Plan for sleep study -- home study. 2. As per Hypersomnia plan.   Obese Pt with recent gaining weight.  Plan to diagnose OSA and start CPAP. CPAP will hopefully improve his over all disposition and make him more active and eventually lose weight.  Advised on increase exercise. Dietary changes.   Hypertension BP is controlled on meds.  Could be related to untreated OSA. Will observe BP patter on cpap.

## 2015-11-17 NOTE — Patient Instructions (Addendum)
You have  snoring, hypersomnia, witnessed apneas, choking, gasping during sleep.  ESS is 13.  We discussed about the diagnosis of Obstructive Sleep Apnea (OSA) and implications of untreated OSA. We discussed about CPAP and BiPaP as possible treatment options.   We will schedule the patient for a sleep study -- HOME SLEEP STUDY.    Patient was instructed to call the office if he/she has not heard back from the office 1-2 weeks after the sleep study.   Patient was instructed to call the office if he/she is having issues with the PAP device.   We discussed good sleep hygiene.   Patient was advised not to engage in activities requiring concentration and/or vigilance if he/she is sleepy.  Patient was advised not to drive if he/she is sleepy.    Return to clinic after 4 weks.

## 2015-11-17 NOTE — Assessment & Plan Note (Signed)
31 M with hypersomnia, snoring, fatigue, choking, gasping. Has mallampati class III-IV.  Plan: 1. Plan for sleep study -- home study. 2. As per Hypersomnia plan.

## 2015-11-17 NOTE — Telephone Encounter (Signed)
Per OV 11/17/15: Return to clinic after 4 weeks. ---   Speaking with JJ about what to do since no schedule out yet.

## 2015-11-17 NOTE — Assessment & Plan Note (Signed)
Pt with recent gaining weight.  Plan to diagnose OSA and start CPAP. CPAP will hopefully improve his over all disposition and make him more active and eventually lose weight.  Advised on increase exercise. Dietary changes.

## 2015-11-21 LAB — HEPATITIS C RNA QUANTITATIVE
HCV QUANT LOG: 5.12 {Log} — AB (ref <1.18)
HCV QUANT: 131776 [IU]/mL — AB (ref <15)

## 2015-11-21 NOTE — Telephone Encounter (Signed)
Dr. Nickola Major scheduled is not available yet

## 2015-11-22 NOTE — Telephone Encounter (Signed)
LMTCB to schedule since AD schedule is now open.

## 2015-11-22 NOTE — Telephone Encounter (Signed)
Patient called and scheduled his appt for 03/31, he requested a Fri afternoon appt and this was first avail.  May close.

## 2015-11-23 NOTE — Addendum Note (Signed)
Addended by: Johnella Moloney on: 11/23/2015 02:15 PM   Modules accepted: Orders

## 2015-12-01 DIAGNOSIS — G4733 Obstructive sleep apnea (adult) (pediatric): Secondary | ICD-10-CM | POA: Diagnosis not present

## 2015-12-03 ENCOUNTER — Telehealth: Payer: Self-pay | Admitting: Pulmonary Disease

## 2015-12-03 DIAGNOSIS — G4733 Obstructive sleep apnea (adult) (pediatric): Secondary | ICD-10-CM

## 2015-12-03 NOTE — Telephone Encounter (Signed)
I read home sleep study on this patient last Friday feb 17. Report is on my table.  He has severe OSA with AHI of 64. Pls inform pt.  Pls order autocpap 5-15 cm H2O. He will need a best mask fit.  I need to see pt in 6-8 weeks. Will need a DL on F/U.  Pt to call back if he has issues with cpap. Thanks.   -- Eddie Candle

## 2015-12-05 NOTE — Telephone Encounter (Deleted)
I read home sleep study on this patient last Friday feb 17. Report is on my table.  He has severe OSA with AHI of 64. Pls inform pt.  Pls order autocpap 5-15 cm H2O. He will need a best mask fit.  I need to see pt in 6-8 weeks. Will need a DL on F/U.  Pt to call back if he has issues with cpap. Thanks.   -- Angelo 

## 2015-12-05 NOTE — Telephone Encounter (Signed)
Pt is aware of sleep study results. Order will be placed for CPAP therapy. ROV has been scheduled for 01/31/2016 at 2pm.

## 2015-12-05 NOTE — Telephone Encounter (Signed)
lmtcb x1 for pt. 

## 2015-12-07 ENCOUNTER — Other Ambulatory Visit: Payer: Self-pay | Admitting: *Deleted

## 2015-12-07 ENCOUNTER — Telehealth: Payer: Self-pay | Admitting: Pulmonary Disease

## 2015-12-07 DIAGNOSIS — R0683 Snoring: Secondary | ICD-10-CM

## 2015-12-07 DIAGNOSIS — G4710 Hypersomnia, unspecified: Secondary | ICD-10-CM

## 2015-12-07 DIAGNOSIS — I10 Essential (primary) hypertension: Secondary | ICD-10-CM

## 2015-12-07 DIAGNOSIS — G4733 Obstructive sleep apnea (adult) (pediatric): Secondary | ICD-10-CM | POA: Diagnosis not present

## 2015-12-07 NOTE — Telephone Encounter (Signed)
Called spoke with pt. Made aware it can take up to 7 business days to receive approval from insurance through DME. He will call if he does not hear from DME next week.

## 2015-12-13 ENCOUNTER — Telehealth: Payer: Self-pay | Admitting: Family Medicine

## 2015-12-13 ENCOUNTER — Telehealth: Payer: Self-pay | Admitting: Pulmonary Disease

## 2015-12-13 NOTE — Telephone Encounter (Signed)
Pt request refill  lisinopril (PRINIVIL,ZESTRIL) 10 MG tablet  [facility] 90 day supply

## 2015-12-13 NOTE — Telephone Encounter (Signed)
Spoke with pt, wanting to know why [facility] has not yet reached out to him regarding cpap machine.   Called [facility] and spoke with Rod Holler, states that they have the order, it just has not been processed yet.   Spoke with pt to make aware.  Nothing further needed at this time.

## 2015-12-14 ENCOUNTER — Other Ambulatory Visit: Payer: Self-pay

## 2015-12-14 MED ORDER — LISINOPRIL 10 MG PO TABS: 10.0000 mg | ORAL_TABLET | Freq: Two times a day (BID) | ORAL | Status: DC

## 2015-12-14 NOTE — Telephone Encounter (Signed)
Medication refilled

## 2015-12-26 ENCOUNTER — Ambulatory Visit: Payer: Self-pay | Admitting: Pulmonary Disease

## 2016-01-11 ENCOUNTER — Other Ambulatory Visit (HOSPITAL_COMMUNITY): Payer: Self-pay | Admitting: Gastroenterology

## 2016-01-11 DIAGNOSIS — B1920 Unspecified viral hepatitis C without hepatic coma: Secondary | ICD-10-CM

## 2016-01-12 ENCOUNTER — Ambulatory Visit: Payer: Self-pay | Admitting: Pulmonary Disease

## 2016-01-18 ENCOUNTER — Ambulatory Visit (HOSPITAL_COMMUNITY)
Admission: RE | Admit: 2016-01-18 | Discharge: 2016-01-18 | Disposition: A | Discharge status: Home / Self Care | Payer: 59 | Source: Ambulatory Visit | Attending: Gastroenterology | Admitting: Gastroenterology

## 2016-01-18 DIAGNOSIS — B1920 Unspecified viral hepatitis C without hepatic coma: Secondary | ICD-10-CM | POA: Insufficient documentation

## 2016-01-18 DIAGNOSIS — R932 Abnormal findings on diagnostic imaging of liver and biliary tract: Secondary | ICD-10-CM | POA: Diagnosis not present

## 2016-01-30 ENCOUNTER — Encounter (HOSPITAL_COMMUNITY): Payer: Self-pay | Admitting: Emergency Medicine

## 2016-01-30 ENCOUNTER — Emergency Department (HOSPITAL_COMMUNITY)
Admission: EM | Admit: 2016-01-30 | Discharge: 2016-01-30 | Disposition: A | Discharge status: Home / Self Care | Payer: 59 | Attending: Emergency Medicine | Admitting: Emergency Medicine

## 2016-01-30 DIAGNOSIS — K529 Noninfective gastroenteritis and colitis, unspecified: Secondary | ICD-10-CM | POA: Diagnosis not present

## 2016-01-30 DIAGNOSIS — Z8619 Personal history of other infectious and parasitic diseases: Secondary | ICD-10-CM | POA: Insufficient documentation

## 2016-01-30 DIAGNOSIS — H548 Legal blindness, as defined in USA: Secondary | ICD-10-CM | POA: Insufficient documentation

## 2016-01-30 DIAGNOSIS — I10 Essential (primary) hypertension: Secondary | ICD-10-CM | POA: Insufficient documentation

## 2016-01-30 DIAGNOSIS — R112 Nausea with vomiting, unspecified: Secondary | ICD-10-CM | POA: Diagnosis present

## 2016-01-30 DIAGNOSIS — Z79899 Other long term (current) drug therapy: Secondary | ICD-10-CM | POA: Diagnosis not present

## 2016-01-30 LAB — COMPREHENSIVE METABOLIC PANEL WITH GFR
ALT: 83 U/L — ABNORMAL HIGH (ref 17–63)
AST: 44 U/L — ABNORMAL HIGH (ref 15–41)
Albumin: 3.8 g/dL (ref 3.5–5.0)
Alkaline Phosphatase: 38 U/L (ref 38–126)
Anion gap: 8 (ref 5–15)
BUN: 11 mg/dL (ref 6–20)
CO2: 25 mmol/L (ref 22–32)
Calcium: 9 mg/dL (ref 8.9–10.3)
Chloride: 107 mmol/L (ref 101–111)
Creatinine, Ser: 1.1 mg/dL (ref 0.61–1.24)
GFR calc Af Amer: >60 mL/min
GFR calc non Af Amer: >60 mL/min
Glucose, Bld: 105 mg/dL — ABNORMAL HIGH (ref 65–99)
Potassium: 3.6 mmol/L (ref 3.5–5.1)
Sodium: 140 mmol/L (ref 135–145)
Total Bilirubin: 0.7 mg/dL (ref 0.3–1.2)
Total Protein: 6.9 g/dL (ref 6.5–8.1)

## 2016-01-30 LAB — CBC WITH DIFFERENTIAL/PLATELET
Basophils Absolute: 0 K/uL (ref 0.0–0.1)
Basophils Relative: 0 %
Eosinophils Absolute: 0 K/uL (ref 0.0–0.7)
Eosinophils Relative: 0 %
HCT: 39.6 % (ref 39.0–52.0)
Hemoglobin: 13 g/dL (ref 13.0–17.0)
Lymphocytes Relative: 16 %
Lymphs Abs: 2 K/uL (ref 0.7–4.0)
MCH: 30.7 pg (ref 26.0–34.0)
MCHC: 32.8 g/dL (ref 30.0–36.0)
MCV: 93.4 fL (ref 78.0–100.0)
Monocytes Absolute: 0.9 K/uL (ref 0.1–1.0)
Monocytes Relative: 7 %
Neutro Abs: 9.9 K/uL — ABNORMAL HIGH (ref 1.7–7.7)
Neutrophils Relative %: 77 %
Platelets: 222 K/uL (ref 150–400)
RBC: 4.24 MIL/uL (ref 4.22–5.81)
RDW: 14.3 % (ref 11.5–15.5)
WBC: 12.8 K/uL — ABNORMAL HIGH (ref 4.0–10.5)

## 2016-01-30 LAB — URINALYSIS, ROUTINE W REFLEX MICROSCOPIC
Bilirubin Urine: NEGATIVE
Glucose, UA: NEGATIVE mg/dL
Hgb urine dipstick: NEGATIVE
Ketones, ur: NEGATIVE mg/dL
Leukocytes, UA: NEGATIVE
Nitrite: NEGATIVE
Protein, ur: NEGATIVE mg/dL
Specific Gravity, Urine: 1.018 (ref 1.005–1.030)
pH: 6 (ref 5.0–8.0)

## 2016-01-30 MED ORDER — ONDANSETRON 4 MG PO TBDP: 4.0000 mg | ORAL_TABLET | Freq: Three times a day (TID) | ORAL | Status: DC | PRN

## 2016-01-30 MED ORDER — KETOROLAC TROMETHAMINE 30 MG/ML IJ SOLN
30.0000 mg | Freq: Once | INTRAMUSCULAR | Status: AC
  Administered 2016-01-30: 30 mg via INTRAVENOUS
  Filled 2016-01-30: qty 1

## 2016-01-30 MED ORDER — ONDANSETRON HCL 4 MG/2ML IJ SOLN
4.0000 mg | Freq: Once | INTRAMUSCULAR | Status: AC
  Administered 2016-01-30: 4 mg via INTRAVENOUS
  Filled 2016-01-30: qty 2

## 2016-01-30 MED ORDER — SODIUM CHLORIDE 0.9 % IV BOLUS (SEPSIS)
1000.0000 mL | Freq: Once | INTRAVENOUS | Status: AC
  Administered 2016-01-30: 1000 mL via INTRAVENOUS

## 2016-01-30 NOTE — ED Notes (Signed)
Patient started vomiting and having bodyaches around 2130 last night. Patient states he also feel nauseated.

## 2016-01-30 NOTE — ED Provider Notes (Signed)
CSN: [id number]     Arrival date & time 01/30/16  [id number] History   First MD Initiated Contact with Patient 01/30/16 [phone]     Chief Complaint  Patient presents with  . Generalized Body Aches  . Emesis  . Nausea     (Consider location/radiation/quality/duration/timing/severity/associated sxs/prior Treatment) HPI  Gabriel Garcia is a 40 year old male with a past medical history of HTN, hepatitis C, GI bleed who presents the ED complaining of emesis and generalized body aches. Patient states that last night while at work he began vomiting which continued throughout the night and into this morning. He states that he did not have anything left in his stomach. He was just dry heaving. His emesis was nonbloody and nonbilious. Patient had a couple of episodes of diarrhea as well. No melena or hematochezia. Patient also complaining of diffuse myalgias. He has not been able to tolerate anything by mouth. Denies fevers, chills, abdominal pain, alcohol or drug use.  Past Medical History  Diagnosis Date  . Hypertension   . Hepatitis C   . 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 Terminal ileitis (HCC)   . Mesenteric adenitis   . Sliding hiatal hernia   . Substance abuse     cocaine  . GI bleed   . GERD (gastroesophageal reflux disease)   . Acute duodenal ulcer with hemorrhage 10/12/2014  . Legally blind in right eye, [place] defined in [place]    Past Surgical History  Procedure Laterality Date  . Eye surgery Right   . Foot surgery Right   . Esophagogastroduodenoscopy N/A 10/13/2014    Procedure: ESOPHAGOGASTRODUODENOSCOPY (EGD);  Surgeon: Iva Booparl E Gessner, MD;  Location: [facility];  Service: Endoscopy;  Laterality: N/A;  . Liver biopsy      Dr. Kinnie ScalesMedoff GI   Family History  Problem Relation Age of Onset  . Rheum arthritis Mother   . Hypertension Mother   . Heart failure Father     rheumatic heart disease, also drinking and smoking  . Colon cancer Neg Hx   . Alcoholism  Father     died age 40  . Alcoholism Mother    Social History  Substance Use Topics  . Smoking status: Never Smoker   . Smokeless tobacco: Never Used  . Alcohol Use: No     Comment: etoh free since 08/2015.    Review of Systems  All other systems reviewed and are negative.     Allergies  Review of patient's allergies indicates no known allergies.  Home Medications   Prior to Admission medications   Medication Sig Start Date End Date Taking? Authorizing Provider  ibuprofen (ADVIL,MOTRIN) 200 MG tablet Take 200 mg by mouth every 6 (six) hours as needed for headache, mild pain or moderate pain.    Yes Historical Provider, MD  lisinopril (PRINIVIL,ZESTRIL) 10 MG tablet Take 1 tablet (10 mg total) by mouth 2 (two) times daily. 12/14/15  Yes Shelva MajesticStephen O Hunter, MD   BP 150/109 mmHg  Pulse 96  Temp(Src) 97.8 F (36.6 C) (Oral)  Resp 16  Ht 5\' 10"  (1.778 m)  Wt 90.719 kg  BMI 28.70 kg/m2  SpO2 99% Physical Exam  Constitutional: He is oriented to person, place, and time. He appears well-developed and well-nourished. No distress.  HENT:  Head: Normocephalic and atraumatic.  Mouth/Throat: No oropharyngeal exudate.  Eyes: Conjunctivae and EOM are normal. Pupils are equal, round, and reactive to light. Right eye exhibits no discharge. Left eye  exhibits no discharge. No scleral icterus.  Cardiovascular: Normal rate, regular rhythm, normal heart sounds and intact distal pulses.  Exam reveals no gallop and no friction rub.   No murmur heard. Pulmonary/Chest: Effort normal and breath sounds normal. No respiratory distress. He has no wheezes. He has no rales. He exhibits no tenderness.  Abdominal: Soft. Bowel sounds are normal. He exhibits no distension and no mass. There is no tenderness. There is no rebound and no guarding.  Musculoskeletal: Normal range of motion. He exhibits no edema.  Neurological: He is alert and oriented to person, place, and time.  Skin: Skin is warm and dry. No  rash noted. He is not diaphoretic. No erythema. No pallor.  Psychiatric: He has a normal mood and affect. His behavior is normal.  Nursing note and vitals reviewed.   ED Course  Procedures (including critical care time) Labs Review Labs Reviewed  CBC WITH DIFFERENTIAL/PLATELET - Abnormal; Notable for the following:    WBC 12.8 (*)    Neutro Abs 9.9 (*)    All other components within normal limits  COMPREHENSIVE METABOLIC PANEL - Abnormal; Notable for the following:    Glucose, Bld 105 (*)    AST 44 (*)    ALT 83 (*)    All other components within normal limits  URINALYSIS, ROUTINE W REFLEX MICROSCOPIC (NOT AT [facility])    Imaging Review No results found. I have personally reviewed and evaluated these images and lab results as part of my medical decision-making.   EKG Interpretation None      MDM   Final diagnoses:  Gastroenteritis    40 year old male with history of hepatitis C presents the ED with acute onset emesis and diffuse myalgias. Patient appears on the ED, nontoxic, nonseptic appearing. Afebrile. All vital signs are stable. No Emesis noted in the emergency department. He denies abdominal pain. Abdomen is soft, benign. No advanced imaging indicated at this time. Patient given IV fluids, Toradol and nausea medication with complete resolution of his symptoms. Mild AST and ALT elevation, this is chronic for patient due to his hepatitis C. He states that he will begin treatment for hepatitis C next week ([facility]). Patient's symptoms are likely due to gastritis. Will DC home with Zofran. Recommend NSAIDs for diffuse myalgias. Return precautions outlined in patient discharge instructions. Patient is hemodynamically stable and ready for discharge.    Lester Kinsman O'Neill, PA-C 01/30/16 1459  Dione Booze, MD 01/30/16 2226

## 2016-01-30 NOTE — Discharge Instructions (Signed)
Follow up with your primary care provider if your symptoms do not improve. Take zofran as needed for nausea. Encourage adequate hydration, drink plenty of fluids. Return to the ED if you experience severe worsening of your symptoms, fever, blood in vomit, blood in stool, abdominal pain.

## 2016-01-31 ENCOUNTER — Ambulatory Visit: Payer: Self-pay | Admitting: Pulmonary Disease

## 2016-02-05 ENCOUNTER — Ambulatory Visit: Payer: Self-pay | Admitting: Pulmonary Disease

## 2016-02-15 ENCOUNTER — Telehealth: Payer: Self-pay | Admitting: *Deleted

## 2016-02-15 NOTE — Telephone Encounter (Signed)
Requesting records for Hep C Referral Office Visit.  Per note in Referral Section a telephone message was left for the pt to call [facility] to schedule an appointment.  Dr. Erasmo LeventhalHunter's representative to leave a message for Hep C Scheduler, Diane S-F, to return call, extension [phone].

## 2016-02-20 ENCOUNTER — Telehealth: Payer: Self-pay | Admitting: Family Medicine

## 2016-02-20 NOTE — Telephone Encounter (Signed)
See below

## 2016-02-20 NOTE — Telephone Encounter (Signed)
Pt states he went to Dr Dulce Sellarutlaw, and saw Dr Dulce Sellarutlaw for his Hep C.   Dr Dulce Sellarutlaw has already started the prior auth for the Hep C med. Infectious disease called him yesterday to set up an appointment and he is not sure why.   Pt would like to know if he has done the correct thing, and wants Dr Durene CalHunter to know he is just waiting on his med for approval.  Is there any reason pt should go to infectious disease??

## 2016-02-20 NOTE — Telephone Encounter (Signed)
No does not have to go to ID if GI is handling the treatment. I just wanted to make sure he was getting treatment. Thank him for following up with us- would love to know when he starts and finishes Hep C treatment

## 2016-04-22 ENCOUNTER — Encounter (HOSPITAL_COMMUNITY): Payer: Self-pay | Admitting: Emergency Medicine

## 2016-04-22 ENCOUNTER — Observation Stay (HOSPITAL_COMMUNITY)
Admission: EM | Admit: 2016-04-22 | Discharge: 2016-04-23 | Disposition: A | Discharge status: Home / Self Care | Payer: 59 | Attending: Internal Medicine | Admitting: Internal Medicine

## 2016-04-22 ENCOUNTER — Observation Stay (HOSPITAL_COMMUNITY): Payer: 59

## 2016-04-22 DIAGNOSIS — F1490 Cocaine use, unspecified, uncomplicated: Secondary | ICD-10-CM

## 2016-04-22 DIAGNOSIS — D72829 Elevated white blood cell count, unspecified: Secondary | ICD-10-CM

## 2016-04-22 DIAGNOSIS — E860 Dehydration: Secondary | ICD-10-CM

## 2016-04-22 DIAGNOSIS — B1920 Unspecified viral hepatitis C without hepatic coma: Secondary | ICD-10-CM | POA: Diagnosis present

## 2016-04-22 DIAGNOSIS — G253 Myoclonus: Secondary | ICD-10-CM | POA: Insufficient documentation

## 2016-04-22 DIAGNOSIS — I10 Essential (primary) hypertension: Secondary | ICD-10-CM | POA: Insufficient documentation

## 2016-04-22 DIAGNOSIS — N179 Acute kidney failure, unspecified: Principal | ICD-10-CM | POA: Diagnosis present

## 2016-04-22 DIAGNOSIS — M545 Low back pain: Secondary | ICD-10-CM | POA: Diagnosis present

## 2016-04-22 LAB — COMPREHENSIVE METABOLIC PANEL
ALBUMIN: 4.9 g/dL (ref 3.5–5.0)
ALK PHOS: 44 U/L (ref 38–126)
ALT: 21 U/L (ref 17–63)
ANION GAP: 14 (ref 5–15)
AST: 26 U/L (ref 15–41)
BILIRUBIN TOTAL: 1.1 mg/dL (ref 0.3–1.2)
BUN: 28 mg/dL — ABNORMAL HIGH (ref 6–20)
CALCIUM: 9.5 mg/dL (ref 8.9–10.3)
CO2: 23 mmol/L (ref 22–32)
Chloride: 102 mmol/L (ref 101–111)
Creatinine, Ser: 2.62 mg/dL — ABNORMAL HIGH (ref 0.61–1.24)
GFR, EST AFRICAN AMERICAN: 34 mL/min — AB (ref >60)
GFR, EST NON AFRICAN AMERICAN: 29 mL/min — AB (ref >60)
Glucose, Bld: 88 mg/dL (ref 65–99)
POTASSIUM: 3.2 mmol/L — AB (ref 3.5–5.1)
Sodium: 139 mmol/L (ref 135–145)
TOTAL PROTEIN: 8.4 g/dL — AB (ref 6.5–8.1)

## 2016-04-22 LAB — I-STAT CREATININE, ED: CREATININE: 1.9 mg/dL — AB (ref 0.61–1.24)

## 2016-04-22 LAB — HEPATIC FUNCTION PANEL
ALBUMIN: 3.9 g/dL (ref 3.5–5.0)
ALK PHOS: 40 U/L (ref 38–126)
ALT: 21 U/L (ref 17–63)
AST: 26 U/L (ref 15–41)
Bilirubin, Direct: 0.2 mg/dL (ref 0.1–0.5)
Indirect Bilirubin: 0.5 mg/dL (ref 0.3–0.9)
TOTAL PROTEIN: 7.2 g/dL (ref 6.5–8.1)
Total Bilirubin: 0.7 mg/dL (ref 0.3–1.2)

## 2016-04-22 LAB — URINALYSIS, ROUTINE W REFLEX MICROSCOPIC
GLUCOSE, UA: NEGATIVE mg/dL
Hgb urine dipstick: NEGATIVE
KETONES UR: NEGATIVE mg/dL
NITRITE: NEGATIVE
PH: 5 (ref 5.0–8.0)
Protein, ur: 30 mg/dL — AB
Specific Gravity, Urine: 1.025 (ref 1.005–1.030)

## 2016-04-22 LAB — BASIC METABOLIC PANEL
Anion gap: 4 — ABNORMAL LOW (ref 5–15)
BUN: 23 mg/dL — ABNORMAL HIGH (ref 6–20)
CALCIUM: 8.9 mg/dL (ref 8.9–10.3)
CHLORIDE: 107 mmol/L (ref 101–111)
CO2: 27 mmol/L (ref 22–32)
CREATININE: 1.69 mg/dL — AB (ref 0.61–1.24)
GFR, EST AFRICAN AMERICAN: 57 mL/min — AB (ref >60)
GFR, EST NON AFRICAN AMERICAN: 49 mL/min — AB (ref >60)
Glucose, Bld: 132 mg/dL — ABNORMAL HIGH (ref 65–99)
Potassium: 4.4 mmol/L (ref 3.5–5.1)
SODIUM: 138 mmol/L (ref 135–145)

## 2016-04-22 LAB — AMMONIA: AMMONIA: 16 umol/L (ref 9–35)

## 2016-04-22 LAB — URINE MICROSCOPIC-ADD ON

## 2016-04-22 LAB — CBC WITH DIFFERENTIAL/PLATELET
BASOS ABS: 0 10*3/uL (ref 0.0–0.1)
BASOS PCT: 0 %
Eosinophils Absolute: 0.1 10*3/uL (ref 0.0–0.7)
Eosinophils Relative: 1 %
HEMATOCRIT: 42.5 % (ref 39.0–52.0)
Hemoglobin: 14.5 g/dL (ref 13.0–17.0)
Lymphocytes Relative: 35 %
Lymphs Abs: 5 10*3/uL — ABNORMAL HIGH (ref 0.7–4.0)
MCH: 30.9 pg (ref 26.0–34.0)
MCHC: 34.1 g/dL (ref 30.0–36.0)
MCV: 90.6 fL (ref 78.0–100.0)
MONO ABS: 1.3 10*3/uL — AB (ref 0.1–1.0)
Monocytes Relative: 9 %
NEUTROS ABS: 7.7 10*3/uL (ref 1.7–7.7)
Neutrophils Relative %: 55 %
Platelets: 230 10*3/uL (ref 150–400)
RBC: 4.69 MIL/uL (ref 4.22–5.81)
RDW: 13.8 % (ref 11.5–15.5)
WBC: 14.1 10*3/uL — ABNORMAL HIGH (ref 4.0–10.5)

## 2016-04-22 LAB — RAPID URINE DRUG SCREEN, HOSP PERFORMED
Amphetamines: NOT DETECTED
BARBITURATES: NOT DETECTED
BENZODIAZEPINES: NOT DETECTED
COCAINE: POSITIVE — AB
OPIATES: NOT DETECTED
Tetrahydrocannabinol: NOT DETECTED

## 2016-04-22 LAB — ETHANOL: Alcohol, Ethyl (B): <5 mg/dL (ref <5)

## 2016-04-22 LAB — CK: CK TOTAL: 338 U/L (ref 49–397)

## 2016-04-22 MED ORDER — SODIUM CHLORIDE 0.9 % IV BOLUS (SEPSIS)
1000.0000 mL | Freq: Once | INTRAVENOUS | Status: AC
  Administered 2016-04-22: 1000 mL via INTRAVENOUS

## 2016-04-22 MED ORDER — METHYLPREDNISOLONE SODIUM SUCC 125 MG IJ SOLR
125.0000 mg | Freq: Once | INTRAMUSCULAR | Status: AC
  Administered 2016-04-22: 125 mg via INTRAVENOUS
  Filled 2016-04-22: qty 2

## 2016-04-22 MED ORDER — ONDANSETRON HCL 4 MG PO TABS: 4.0000 mg | ORAL_TABLET | Freq: Four times a day (QID) | ORAL | Status: DC | PRN

## 2016-04-22 MED ORDER — SODIUM CHLORIDE 0.9 % IV SOLN
INTRAVENOUS | Status: DC
  Administered 2016-04-22 – 2016-04-23 (×3): via INTRAVENOUS

## 2016-04-22 MED ORDER — POTASSIUM CHLORIDE CRYS ER 20 MEQ PO TBCR: 20.0000 meq | EXTENDED_RELEASE_TABLET | Freq: Once | ORAL | Status: DC

## 2016-04-22 MED ORDER — MORPHINE SULFATE (PF) 2 MG/ML IV SOLN: 1.0000 mg | INTRAVENOUS | Status: DC | PRN

## 2016-04-22 MED ORDER — ACETAMINOPHEN 650 MG RE SUPP: 650.0000 mg | Freq: Four times a day (QID) | RECTAL | Status: DC | PRN

## 2016-04-22 MED ORDER — ONDANSETRON HCL 4 MG/2ML IJ SOLN
4.0000 mg | Freq: Four times a day (QID) | INTRAMUSCULAR | Status: DC | PRN
Start: 2016-04-22 — End: 2016-04-23

## 2016-04-22 MED ORDER — LORAZEPAM 2 MG/ML IJ SOLN
1.0000 mg | Freq: Once | INTRAMUSCULAR | Status: AC
  Administered 2016-04-22: 1 mg via INTRAVENOUS
  Filled 2016-04-22: qty 1

## 2016-04-22 MED ORDER — LEDIPASVIR-SOFOSBUVIR 90-400 MG PO TABS
1.0000 | ORAL_TABLET | Freq: Every day | ORAL | Status: DC
  Administered 2016-04-22 – 2016-04-23 (×2): 1 via ORAL

## 2016-04-22 MED ORDER — ACETAMINOPHEN 325 MG PO TABS: 650.0000 mg | ORAL_TABLET | Freq: Four times a day (QID) | ORAL | Status: DC | PRN

## 2016-04-22 MED ORDER — ENOXAPARIN SODIUM 40 MG/0.4ML ~~LOC~~ SOLN
40.0000 mg | SUBCUTANEOUS | Status: DC
  Administered 2016-04-22: 40 mg via SUBCUTANEOUS
  Filled 2016-04-22: qty 0.4

## 2016-04-22 MED ORDER — SODIUM CHLORIDE 0.9% FLUSH
3.0000 mL | Freq: Two times a day (BID) | INTRAVENOUS | Status: DC
  Administered 2016-04-23: 3 mL via INTRAVENOUS

## 2016-04-22 NOTE — ED Notes (Signed)
Provider at bedside at this time to discuss plan of care 

## 2016-04-22 NOTE — ED Notes (Signed)
Pt laying in bed eyes closed equal chest rise and fall no acute distress at this time awaiting lab results and disposition.

## 2016-04-22 NOTE — ED Notes (Signed)
ED PA at bedside

## 2016-04-22 NOTE — Plan of Care (Signed)
Problem: Education: Goal: Knowledge of [facility] Education information/materials will improve Outcome: Progressing Continue.    Problem: Safety: Goal: Ability to remain free from injury will improve Continue.  Pt impulsive, restless and unsteady gait.   Fall precautions initiated including bed alarm.       Problem: Health Behavior/Discharge Planning: Goal: Ability to manage health-related needs will improve Continue.  Pt remains restless and agitated.

## 2016-04-22 NOTE — ED Notes (Addendum)
Per EMS pt reports to have been on 4 day crack binge. EMS also reports rash to right thigh with burning and pain to site. 20ga IV to Right AC. Pt reports pain to knees, and thighs for the last several days. Pt admits to using $2000 worth of crack over the last 4 days. Pt states pain in thighs and knees is throbbing in nature.

## 2016-04-22 NOTE — ED Provider Notes (Signed)
CSN: [id number]     Arrival date & time 04/22/16  0353 History   First MD Initiated Contact with Patient 04/22/16 0356     Chief Complaint  Patient presents with  . Drug Problem     (Consider location/radiation/quality/duration/timing/severity/associated sxs/prior Treatment) HPI Comments: Patient is a 40 year old male with a history of hepatitis C, hypertension, esophageal reflux, and substance abuse who presents to the emergency department for complaints of a constant pain to his low back, bilateral hips and thighs, and bilateral feet. Patient describes the pain as burning. He states that his symptoms have been worsening since onset yesterday and causing him to move around frequently/fidget. He reports that he has been on a 4 day crack cocaine binge. He denies IV drug use. He also reports development of a rash to his right thigh in addition to a rash to his left lower extremity and abdomen. He denies fevers, chest pain, and abdominal pain. No bowel/bladder incontinence.  Patient is a 40 y.o. male presenting with drug problem. The history is provided by the patient. No language interpreter was used.  Drug Problem    Past Medical History  Diagnosis Date  . Hypertension   . Hepatitis C   . 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 Terminal ileitis (HCC)   . Mesenteric adenitis   . Sliding hiatal hernia   . Substance abuse     cocaine  . GI bleed   . GERD (gastroesophageal reflux disease)   . Acute duodenal ulcer with hemorrhage 10/12/2014  . Legally blind in right eye, as defined in [place]    Past Surgical History  Procedure Laterality Date  . Eye surgery Right   . Foot surgery Right   . Esophagogastroduodenoscopy N/A 10/13/2014    Procedure: ESOPHAGOGASTRODUODENOSCOPY (EGD);  Surgeon: Iva Boop, MD;  Location: [facility];  Service: Endoscopy;  Laterality: N/A;  . Liver biopsy      Dr. Kinnie Scales GI   Family History  Problem Relation Age of Onset  . Rheum  arthritis Mother   . Hypertension Mother   . Heart failure Father     rheumatic heart disease, also drinking and smoking  . Colon cancer Neg Hx   . Alcoholism Father     died age 51  . Alcoholism Mother    Social History  Substance Use Topics  . Smoking status: Never Smoker   . Smokeless tobacco: Never Used  . Alcohol Use: No     Comment: etoh free since 08/2015.    Review of Systems Ten systems reviewed and are negative for acute change, except as noted in the HPI.    Allergies  Review of patient's allergies indicates no known allergies.  Home Medications   Prior to Admission medications   Medication Sig Start Date End Date Taking? Authorizing Provider  HARVONI 90-400 MG TABS Take 1 tablet by mouth daily. 04/05/16  Yes Historical Provider, MD  lisinopril (PRINIVIL,ZESTRIL) 10 MG tablet Take 1 tablet (10 mg total) by mouth 2 (two) times daily. Patient not taking: Reported on 04/22/2016 12/14/15   [name], MD  ondansetron (ZOFRAN ODT) 4 MG disintegrating tablet Take 1 tablet (4 mg total) by mouth every 8 (eight) hours as needed for nausea or vomiting. Patient not taking: Reported on 04/22/2016 01/30/16   Samantha Tripp Dowless, PA-C   BP 142/90 mmHg  Pulse 104  Temp(Src) 98.6 F (37 C) (Oral)  Resp 22  SpO2 98%   Physical Exam  Constitutional: He is oriented to person, place, and time. He appears well-developed. No distress.  Patient agitated, moving and fidgeting in the bed.  HENT:  Head: Normocephalic and atraumatic.  Eyes: Conjunctivae and EOM are normal. No scleral icterus.  Blind in right eye  Neck: Normal range of motion.  Cardiovascular: Regular rhythm and intact distal pulses.  Tachycardia present.   Pulmonary/Chest: Effort normal and breath sounds normal. No respiratory distress. He has no wheezes. He has no rales.  Lungs CTAB  Musculoskeletal: Normal range of motion.  Neurological: He is alert and oriented to person, place, and time. He exhibits normal  muscle tone. Coordination normal.  GCS 15. Patient moving all extremities vigorously.   Skin: Skin is warm and dry. No rash noted. He is not diaphoretic. No erythema. No pallor.  Psychiatric: His speech is rapid and/or pressured. He is agitated and hyperactive.  Nursing note and vitals reviewed.   ED Course  Procedures (including critical care time) Labs Review Labs Reviewed  CBC WITH DIFFERENTIAL/PLATELET - Abnormal; Notable for the following:    WBC 14.1 (*)    Lymphs Abs 5.0 (*)    Monocytes Absolute 1.3 (*)    All other components within normal limits  COMPREHENSIVE METABOLIC PANEL - Abnormal; Notable for the following:    Potassium 3.2 (*)    BUN 28 (*)    Creatinine, Ser 2.62 (*)    Total Protein 8.4 (*)    GFR calc non Af Amer 29 (*)    GFR calc Af Amer 34 (*)    All other components within normal limits  URINE RAPID DRUG SCREEN, HOSP PERFORMED - Abnormal; Notable for the following:    Cocaine POSITIVE (*)    All other components within normal limits  URINALYSIS, ROUTINE W REFLEX MICROSCOPIC (NOT AT [facility]) - Abnormal; Notable for the following:    Color, Urine AMBER (*)    APPearance CLOUDY (*)    Bilirubin Urine MODERATE (*)    Protein, ur 30 (*)    Leukocytes, UA SMALL (*)    All other components within normal limits  URINE MICROSCOPIC-ADD ON - Abnormal; Notable for the following:    Squamous Epithelial / LPF 0-5 (*)    Bacteria, UA MANY (*)    Casts HYALINE CASTS (*)    All other components within normal limits  CK  ETHANOL  I-STAT CREATININE, ED    Imaging Review No results found.   I have personally reviewed and evaluated these images and lab results as part of my medical decision-making.   EKG Interpretation None      MDM   Final diagnoses:  AKI (acute kidney injury) (HCC)  Dehydration  Crack cocaine use    40 year old male presents to the emergency department for complaints of bilateral burning hip, thigh, and foot pain bilaterally. His  symptoms appear consistent with neuropathic type pain. He is neurovascularly intact and moving all extremities vigorously. Patient also afebrile. He has had no bowel or bladder incontinence or other signs concerning for cauda equina. Of note, patient has been on a 4 day crack cocaine binge.  Laboratory workup reveals AKI with elevated creatinine to 2.6. His baseline creatinine is approximately 1. Patient's BUN has also more than doubled. IVF given. Suspect dehydration. [facility] consulted for admission. Dr. Toniann Fail requests repeat creatinine and repeat consultation once this lab results. Patient signed out to Burna Forts, PA-C who will follow.   Filed Vitals:   04/22/16 1610 04/22/16 0403 04/22/16 0430 04/22/16 9604  BP:  139/100 148/109 142/90  Pulse:  113 114 104  Temp:  98.6 F (37 C)    TempSrc:  Oral    Resp:  22 24 22   SpO2: 99% 100% 99% 98%     Antony MaduraKelly Izadora Roehr, PA-C 04/22/16 0615  Dione Boozeavid Glick, MD 04/22/16 [phone]

## 2016-04-22 NOTE — H&P (Addendum)
History and Physical    Gabriel Garcia WUJ:[id number] DOB: 04-18-1976 DOA: 04/22/2016  PCP: Tana Conch, MD  Patient coming from: Home   Chief Complaint: was using drugs, wants help/   HPI: Gabriel Garcia is a 40 y.o. male with medical history significant of HTN, hepatitis C, substance abuse, prior history of alcohol abuse who presents because he wants help to stop using drugs. He relates he has been using cocaine for last 4 days. He denies drinking alcohol or using any other drug. He report involuntary movements of upper extremity and legs. He denies chest pain , dyspnea, cough. He has not been able to sleep in the last 3 days. He falls sleep in between conversations.   ED Course: K at 3.2, Cr 2.6, WBC 14, UA ; 0-5 WBC, UDS positive for cocaine.   Review of Systems: As per HPI otherwise 10 point review of systems negative.    Past Medical History  Diagnosis Date  . Hypertension   . Hepatitis C   . 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 Terminal ileitis (HCC)   . Mesenteric adenitis   . Sliding hiatal hernia   . Substance abuse     cocaine  . GI bleed   . GERD (gastroesophageal reflux disease)   . Acute duodenal ulcer with hemorrhage 10/12/2014  . Legally blind in right eye, as defined in [place]     Past Surgical History  Procedure Laterality Date  . Eye surgery Right   . Foot surgery Right   . Esophagogastroduodenoscopy N/A 10/13/2014    Procedure: ESOPHAGOGASTRODUODENOSCOPY (EGD);  Surgeon: Iva Boop, MD;  Location: [facility];  Service: Endoscopy;  Laterality: N/A;  . Liver biopsy      Dr. Kinnie Scales GI   Social history   reports that he has never smoked. He has never used smokeless tobacco. He reports that he does not drink alcohol or use illicit drugs.  No Known Allergies  Family History  Problem Relation Age of Onset  . Rheum arthritis Mother   . Hypertension Mother   . Heart failure Father     rheumatic heart disease, also  drinking and smoking  . Colon cancer Neg Hx   . Alcoholism Father     died age 15  . Alcoholism Mother      Prior to Admission medications   Medication Sig Start Date End Date Taking? Authorizing Provider  HARVONI 90-400 MG TABS Take 1 tablet by mouth daily. 04/05/16  Yes Historical Provider, MD  lisinopril (PRINIVIL,ZESTRIL) 10 MG tablet Take 1 tablet (10 mg total) by mouth 2 (two) times daily. Patient not taking: Reported on 04/22/2016 12/14/15   [name], MD  ondansetron (ZOFRAN ODT) 4 MG disintegrating tablet Take 1 tablet (4 mg total) by mouth every 8 (eight) hours as needed for nausea or vomiting. Patient not taking: Reported on 04/22/2016 01/30/16   Dub Mikes, PA-C    Physical Exam: Filed Vitals:   04/22/16 0900 04/22/16 0930 04/22/16 0948 04/22/16 1010  BP: 145/98 157/105 147/105 140/99  Pulse: 97 116 104 89  Temp:   98.6 F (37 C) 98.6 F (37 C)  TempSrc:   Oral Oral  Resp: Height:     (1.778 m)  Weight:    85.5 kg (188 lb 7.9 oz)  SpO2: 98% 100%        Constitutional: NAD, calm, comfortable, involuntary movement, fall sleep./  Filed  Vitals:   04/22/16 0900 04/22/16 0930 04/22/16 0948 04/22/16 1010  BP: 145/98 157/105 147/105 140/99  Pulse: 97 116 104 89  Temp:   98.6 F (37 C) 98.6 F (37 C)  TempSrc:   Oral Oral  Resp: 21 23 24 20   Height:    5\' 10"  (1.778 m)  Weight:    85.5 kg (188 lb 7.9 oz)  SpO2: 98% 100%     Eyes: PERRL, lids and conjunctivae normal ENMT: Mucous membranes are moist. Posterior pharynx clear of any exudate or lesions. Normal dentition.  Neck: normal, supple, no masses, no thyromegaly Respiratory: clear to auscultation bilaterally, no wheezing, no crackles. Normal respiratory effort. No accessory muscle use.  Cardiovascular: Regular rate and rhythm, no murmurs / rubs / gallops. No extremity edema. 2+ pedal pulses. No carotid bruits.  Abdomen: no tenderness, no masses palpated. No hepatosplenomegaly.  Bowel sounds positive.  Musculoskeletal: no clubbing / cyanosis. No joint deformity upper and lower extremities. Good ROM, no contractures. Normal muscle tone.  Skin: no rashes, lesions, ulcers. No induration Neurologic: CN 2-12 grossly intact. Sensation intact, DTR normal. Strength 5/5 in all 4. Involuntary movement, but alert.  Psychiatric: anxious.    Labs on Admission: I have personally reviewed following labs and imaging studies  CBC:  Recent Labs Lab 04/22/16 0423  WBC 14.1*  NEUTROABS 7.7  HGB 14.5  HCT 42.5  MCV 90.6  PLT 230   Basic Metabolic Panel:  Recent Labs Lab 04/22/16 0423 04/22/16 0615  NA 139  --   K 3.2*  --   CL 102  --   CO2 23  --   GLUCOSE 88  --   BUN 28*  --   CREATININE 2.62* 1.90*  CALCIUM 9.5  --    GFR: Estimated Creatinine Clearance: 53.9 mL/min (by C-G formula based on Cr of 1.9). Liver Function Tests:  Recent Labs Lab 04/22/16 0423  AST 26  ALT 21  ALKPHOS 44  BILITOT 1.1  PROT 8.4*  ALBUMIN 4.9   No results for input(s): LIPASE, AMYLASE in the last 168 hours. No results for input(s): AMMONIA in the last 168 hours. Coagulation Profile: No results for input(s): INR, PROTIME in the last 168 hours. Cardiac Enzymes:  Recent Labs Lab 04/22/16 0423  CKTOTAL 338   BNP (last 3 results) No results for input(s): PROBNP in the last 8760 hours. HbA1C: No results for input(s): HGBA1C in the last 72 hours. CBG: No results for input(s): GLUCAP in the last 168 hours. Lipid Profile: No results for input(s): CHOL, HDL, LDLCALC, TRIG, CHOLHDL, LDLDIRECT in the last 72 hours. Thyroid Function Tests: No results for input(s): TSH, T4TOTAL, FREET4, T3FREE, THYROIDAB in the last 72 hours. Anemia Panel: No results for input(s): VITAMINB12, FOLATE, FERRITIN, TIBC, IRON, RETICCTPCT in the last 72 hours. Urine analysis:    Component Value Date/Time   COLORURINE AMBER* 04/22/2016 0513   APPEARANCEUR CLOUDY* 04/22/2016 0513   LABSPEC  1.025 04/22/2016 0513   PHURINE 5.0 04/22/2016 0513   GLUCOSEU NEGATIVE 04/22/2016 0513   HGBUR NEGATIVE 04/22/2016 0513   BILIRUBINUR MODERATE* 04/22/2016 0513   BILIRUBINUR n 11/16/2015 1411   KETONESUR NEGATIVE 04/22/2016 0513   PROTEINUR 30* 04/22/2016 0513   PROTEINUR n 11/16/2015 1411   UROBILINOGEN 1.0 11/16/2015 1411   UROBILINOGEN 0.2 07/06/2014 1630   NITRITE NEGATIVE 04/22/2016 0513   NITRITE n 11/16/2015 1411   LEUKOCYTESUR SMALL* 04/22/2016 0513   Sepsis Labs: !!!!!!!!!!!!!!!!!!!!!!!!!!!!!!!!!!!!!!!!!!!! @LABRCNTIP (procalcitonin:4,lacticidven:4) )No results found for this or any  previous visit (from the past 240 hour(s)).   Radiological Exams on Admission: No results found.  EKG: none available. Will order EKG   Assessment/Plan Active Problems:   Hepatitis C   AKI (acute kidney injury) (HCC)   Crack cocaine use   Dehydration  1-AKI; suspect related to hypovolemia, recent drugs use.  IV fluids.  Repeat labs in am.  Replete k as needed.   2-Myoclonus, involuntary movement ;  Suspect related to drug use.  IV fluids.  Could use  Ativan as need but he is lethargic at times.  Check ammonia.   3-Leukocytosis;  IV fluids.  Check chest x ray.  4-Hepatitis C.  Check LFT.  Check Ammonia level.   5-Dug abuse;  SW consulted.   DVT prophylaxis: lovenox Code Status: full code.  Family Communication: care discussed with patient.  Disposition Plan: to be determine Consults called: none Admission status: Observation. Telemetry    Alba Cory MD [facility] Pager [phone]  If 7PM-7AM, please contact night-coverage [email or web address] Password [email or web address]  04/22/2016, 12:11 PM

## 2016-04-22 NOTE — Care Management Note (Signed)
Case Management Note  Patient Details  Name: Lilyan GilfordBilly Jason Stach MRN: [id number] Date of Birth: 02/07/1976  Subjective/Objective: 40 y/o m admitted w/hypokalemia, cocaine abuse. From home.                  Action/Plan:d/c plan home.   Expected Discharge Date:   (unknown)               Expected Discharge Plan:  Home/Self Care  In-House Referral:     Discharge planning Services  CM Consult  Post Acute Care Choice:    Choice offered to:     DME Arranged:    DME Agency:     HH Arranged:    HH Agency:     Status of Service:  In process, will continue to follow  If discussed at Long Length of Stay Meetings, dates discussed:    Additional Comments:  Lanier ClamMahabir, Ronte Parker, RN 04/22/2016, 2:59 PM

## 2016-04-22 NOTE — ED Provider Notes (Signed)
Pt signed out to me at shift change by Antony MaduraKelly Humes PA-C. Please see provider's note for full H&P. Briefly history includes history of substance abuse, 4 days of significant crack cocaine abuse. Patient reports that over the last 4 days he has had one Gatorade, and no food. Patient's laboratory analysis was significant for a creatinine at 2.6 which is significantly above his baseline at 1. Hospitalist service was consult that, they instructed to have repeat Chem-8 and follow-up call. Patient's creatinine improved at 1.9, still significantly above his baseline. Recheck of the patient shows he is very anxious, is in and out of sleep, snoring. Attempted giving patient food, while chewing he fell asleep and started snoring with food in his mouth. Patient will likely need hospital admission for a KI, fluid hydration, and monitoring until he is safe for disposition home.  Eyvonne MechanicJeffrey Zafirah Vanzee, PA-C 04/23/16 [id number]  Leta BaptistEmily Roe Nguyen, MD 05/06/16 [phone]

## 2016-04-22 NOTE — ED Notes (Signed)
Bed: [id number] Expected date:  Expected time:  Means of arrival:  Comments: EMS 4 DAY [facility]

## 2016-04-23 DIAGNOSIS — E860 Dehydration: Secondary | ICD-10-CM | POA: Diagnosis not present

## 2016-04-23 DIAGNOSIS — N179 Acute kidney failure, unspecified: Secondary | ICD-10-CM | POA: Diagnosis not present

## 2016-04-23 DIAGNOSIS — F1490 Cocaine use, unspecified, uncomplicated: Secondary | ICD-10-CM | POA: Diagnosis not present

## 2016-04-23 LAB — CBC
HCT: 39.3 % (ref 39.0–52.0)
Hemoglobin: 12.9 g/dL — ABNORMAL LOW (ref 13.0–17.0)
MCH: 30.6 pg (ref 26.0–34.0)
MCHC: 32.8 g/dL (ref 30.0–36.0)
MCV: 93.3 fL (ref 78.0–100.0)
PLATELETS: 214 10*3/uL (ref 150–400)
RBC: 4.21 MIL/uL — ABNORMAL LOW (ref 4.22–5.81)
RDW: 13.9 % (ref 11.5–15.5)
WBC: 9.5 10*3/uL (ref 4.0–10.5)

## 2016-04-23 LAB — BASIC METABOLIC PANEL
ANION GAP: 4 — AB (ref 5–15)
BUN: 19 mg/dL (ref 6–20)
CO2: 27 mmol/L (ref 22–32)
CREATININE: 1.19 mg/dL (ref 0.61–1.24)
Calcium: 8.6 mg/dL — ABNORMAL LOW (ref 8.9–10.3)
Chloride: 111 mmol/L (ref 101–111)
Glucose, Bld: 103 mg/dL — ABNORMAL HIGH (ref 65–99)
Potassium: 4.1 mmol/L (ref 3.5–5.1)
SODIUM: 142 mmol/L (ref 135–145)

## 2016-04-23 MED ORDER — CLONIDINE HCL 0.2 MG PO TABS
0.2000 mg | ORAL_TABLET | Freq: Three times a day (TID) | ORAL | Status: DC
  Administered 2016-04-23: 0.2 mg via ORAL
  Filled 2016-04-23: qty 1

## 2016-04-23 MED ORDER — CLONIDINE HCL 0.1 MG PO TABS
0.1000 mg | ORAL_TABLET | Freq: Three times a day (TID) | ORAL | Status: DC
  Administered 2016-04-23: 0.1 mg via ORAL
  Filled 2016-04-23: qty 1

## 2016-04-23 NOTE — Discharge Instructions (Signed)
Follow with Primary MD Tana ConchStephen Hunter, MD in 2 days   Get CBC, CMP, 2 view Chest X ray checked  by Primary MD or SNF MD in 5-7 days ( we routinely change or add medications that can affect your baseline labs and fluid status, therefore we recommend that you get the mentioned basic workup next visit with your PCP, your PCP may decide not to get them or add new tests based on their clinical decision)   Activity: As tolerated with Full fall precautions use walker/cane & assistance as needed   Disposition Home    Diet:   Heart Healthy    For Heart failure patients - Check your Weight same time everyday, if you gain over 2 pounds, or you develop in leg swelling, experience more shortness of breath or chest pain, call your Primary MD immediately. Follow Cardiac Low Salt Diet and 1.5 lit/day fluid restriction.   On your next visit with your primary care physician please Get Medicines reviewed and adjusted.   Please request your Prim.MD to go over all Hospital Tests and Procedure/Radiological results at the follow up, please get all Hospital records sent to your Prim MD by signing hospital release before you go home.   If you experience worsening of your admission symptoms, develop shortness of breath, life threatening emergency, suicidal or homicidal thoughts you must seek medical attention immediately by calling 911 or calling your MD immediately  if symptoms less severe.  You Must read complete instructions/literature along with all the possible adverse reactions/side effects for all the Medicines you take and that have been prescribed to you. Take any new Medicines after you have completely understood and accpet all the possible adverse reactions/side effects.   Do not drive, operate heavy machinery, perform activities at heights, swimming or participation in water activities or provide baby sitting services if your were admitted for syncope or siezures until you have seen by Primary MD or a  Neurologist and advised to do so again.  Do not drive when taking Pain medications.    Do not take more than prescribed Pain, Sleep and Anxiety Medications  Special Instructions: If you have smoked or chewed Tobacco  in the last 2 yrs please stop smoking, stop any regular Alcohol  and or any Recreational drug use.  Wear Seat belts while driving.   Please note  You were cared for by a hospitalist during your hospital stay. If you have any questions about your discharge medications or the care you received while you were in the hospital after you are discharged, you can call the unit and asked to speak with the hospitalist on call if the hospitalist that took care of you is not available. Once you are discharged, your primary care physician will handle any further medical issues. Please note that NO REFILLS for any discharge medications will be authorized once you are discharged, as it is imperative that you return to your primary care physician (or establish a relationship with a primary care physician if you do not have one) for your aftercare needs so that they can reassess your need for medications and monitor your lab values.

## 2016-04-23 NOTE — Discharge Summary (Signed)
Gabriel Garcia WRU:[id number] DOB: 12-Jun-1976 DOA: 04/22/2016  PCP: Gabriel Conch, MD  Admit date: 04/22/2016  Discharge date: 04/23/2016  Admitted From: Home   Disposition:  Home   Recommendations for Outpatient Follow-up:   Follow up with PCP in 1-2 weeks  PCP Please obtain BMP/CBC, 2 view CXR in 1week,  (see Discharge instructions)   PCP Please follow up on the following pending results: None   Home Health: None   Equipment/Devices: None  Discharge Condition: Stable    CODE STATUS: Full   Diet Recommendation: Heart healthy Consultations: Patient does not want to see social work or Geneticist, molecular Complaint  Patient presents with  . Drug Problem     Brief history of present illness from the day of admission and additional interim summary    Gabriel Garcia is a 40 y.o. male with medical history significant of HTN, hepatitis C, substance abuse, prior history of alcohol abuse who presents because he wants help to stop using drugs. He relates he has been using cocaine for last 4 days. He denies drinking alcohol or using any other drug. He report involuntary movements of upper extremity and legs. He denies chest pain , dyspnea, cough. He has not been able to sleep in the last 3 days. He falls sleep in between conversations.   ED Course: K at 3.2, Cr 2.6, WBC 14, UA ; 0-5 WBC, UDS positive for cocaine.   Hospital issues addressed    1. Acute renal failure due to dehydration from not eating or drinking for 4 days while he was on her cocaine binge and was also on an ACE inhibitor, resolved with hydration. Back to baseline, has been extensively counseled about keeping himself well hydrated, requested to follow with PCP in 2 days for a repeat CBC BMP.  2. Cocaine binge. Counseled, currently back to baseline.  He had some myoclonic jerks from severe dehydration and excessive amounts of cocaine in the system which have resolved as well. Patient did not want any social work or psych help, he is already enrolled himself in a detox program which she claims is in [place]  3. History of hepatitis C. Stable ammonia level continue outpatient follow-up with PCP and ID as needed.  4. Essential hypertension. Resume home regimen.   Discharge diagnosis     Active Problems:   Hepatitis C   AKI (acute kidney injury) (HCC)   Crack cocaine use   Dehydration    Discharge instructions    Discharge Instructions    Diet - low sodium heart healthy    Complete by:  As directed      Discharge instructions    Complete by:  As directed   Follow with Primary MD Gabriel Conch, MD in 2 days.   Get CBC, CMP, 2 view Chest X ray checked  by Primary MD or SNF MD in 5-7 days ( we routinely change or add medications that can affect your baseline labs and fluid status, therefore we recommend that you get the mentioned  basic workup next visit with your PCP, your PCP may decide not to get them or add new tests based on their clinical decision)   Activity: As tolerated with Full fall precautions use walker/cane & assistance as needed   Disposition Home   Diet:   Heart Healthy    For Heart failure patients - Check your Weight same time everyday, if you gain over 2 pounds, or you develop in leg swelling, experience more shortness of breath or chest pain, call your Primary MD immediately. Follow Cardiac Low Salt Diet and 1.5 lit/day fluid restriction.   On your next visit with your primary care physician please Get Medicines reviewed and adjusted.   Please request your Prim.MD to go over all Hospital Tests and Procedure/Radiological results at the follow up, please get all Hospital records sent to your Prim MD by signing hospital release before you go home.   If you experience worsening of your admission symptoms,  develop shortness of breath, life threatening emergency, suicidal or homicidal thoughts you must seek medical attention immediately by calling 911 or calling your MD immediately  if symptoms less severe.  You Must read complete instructions/literature along with all the possible adverse reactions/side effects for all the Medicines you take and that have been prescribed to you. Take any new Medicines after you have completely understood and accpet all the possible adverse reactions/side effects.   Do not drive, operate heavy machinery, perform activities at heights, swimming or participation in water activities or provide baby sitting services if your were admitted for syncope or siezures until you have seen by Primary MD or a Neurologist and advised to do so again.  Do not drive when taking Pain medications.    Do not take more than prescribed Pain, Sleep and Anxiety Medications  Special Instructions: If you have smoked or chewed Tobacco  in the last 2 yrs please stop smoking, stop any regular Alcohol  and or any Recreational drug use.  Wear Seat belts while driving.   Please note  You were cared for by a hospitalist during your hospital stay. If you have any questions about your discharge medications or the care you received while you were in the hospital after you are discharged, you can call the unit and asked to speak with the hospitalist on call if the hospitalist that took care of you is not available. Once you are discharged, your primary care physician will handle any further medical issues. Please note that NO REFILLS for any discharge medications will be authorized once you are discharged, as it is imperative that you return to your primary care physician (or establish a relationship with a primary care physician if you do not have one) for your aftercare needs so that they can reassess your need for medications and monitor your lab values.     Increase activity slowly    Complete by:   As directed            Discharge Medications     Medication List    TAKE these medications        HARVONI 90-400 MG Tabs  Generic drug:  Ledipasvir-Sofosbuvir  Take 1 tablet by mouth daily.     lisinopril 10 MG tablet  Commonly known as:  PRINIVIL,ZESTRIL  Take 1 tablet (10 mg total) by mouth 2 (two) times daily.     ondansetron 4 MG disintegrating tablet  Commonly known as:  ZOFRAN ODT  Take 1 tablet (4 mg total) by mouth every  8 (eight) hours as needed for nausea or vomiting.        No Known Allergies      Follow-up Information    Follow up with Gabriel ConchStephen Hunter, MD. Schedule an appointment as soon as possible for a visit in 2 days.   Specialty:  Family Medicine   Contact information:   [address] [place] [address] [phone]       Major procedures and Radiology Reports - PLEASE review detailed and final reports thoroughly  -        Dg Chest 2 View  04/22/2016  CLINICAL DATA:  Leukocytosis, hypertension EXAM: CHEST  2 VIEW COMPARISON:  12/04/2012 FINDINGS: Normal heart size, mediastinal contours, and pulmonary vascularity. Chronic bronchitic changes without infiltrate, pleural effusion or pneumothorax. Bones unremarkable. IMPRESSION: No acute abnormalities. Electronically Signed   By: Ulyses SouthwardMark  Boles M.D.   On: 04/22/2016 14:23    Micro Results      No results found for this or any previous visit (from the past 240 hour(s)).  Today   Subjective    Gabriel LindauBilly Garcia today has no headache,no chest abdominal pain,no new weakness tingling or numbness, feels much better wants to go home today.    Objective   Blood pressure 139/80, pulse 73, temperature 97.7 F (36.5 C), temperature source Oral, resp. rate 22, height 5\' 10"  (1.778 m), weight 85.5 kg (188 lb 7.9 oz), SpO2 100 %.   Intake/Output Summary (Last 24 hours) at 04/23/16 1047 Last data filed at 04/23/16 [id number]  Gross per 24 hour  Intake    480 ml  Output   2250 ml  Net  -1770 ml     Exam Awake Alert, Oriented x 3, No new F.N deficits, Normal affect [place].AT,PERRAL Supple Neck,No JVD, No cervical lymphadenopathy appriciated.  Symmetrical Chest wall movement, Good air movement bilaterally, CTAB RRR,No Gallops,Rubs or new Murmurs, No Parasternal Heave +ve B.Sounds, Abd Soft, Non tender, No organomegaly appriciated, No rebound -guarding or rigidity. No Cyanosis, Clubbing or edema, No new Rash or bruise   Data Review   CBC w Diff: Lab Results  Component Value Date   WBC 9.5 04/23/2016   HGB 12.9* 04/23/2016   HCT 39.3 04/23/2016   PLT 214 04/23/2016   LYMPHOPCT 35 04/22/2016   MONOPCT 9 04/22/2016   EOSPCT 1 04/22/2016   BASOPCT 0 04/22/2016    CMP: Lab Results  Component Value Date   NA 142 04/23/2016   K 4.1 04/23/2016   CL 111 04/23/2016   CO2 27 04/23/2016   BUN 19 04/23/2016   CREATININE 1.19 04/23/2016   PROT 7.2 04/22/2016   ALBUMIN 3.9 04/22/2016   BILITOT 0.7 04/22/2016   ALKPHOS 40 04/22/2016   AST 26 04/22/2016   ALT 21 04/22/2016  .   Total Time in preparing paper work, data evaluation and todays exam - 35 minutes  Leroy SeaSINGH,Sylvanna Burggraf K M.D on 04/23/2016 at 10:47 AM  [facility]   Office  [phone]

## 2016-04-23 NOTE — Care Management Note (Signed)
Case Management Note  Patient Details  Name: Gabriel Garcia MRN: [id number] Date of Birth: 11/06/1975  Subjective/Objective:                    Action/Plan:d/c home no needs or orders.   Expected Discharge Date:   (unknown)               Expected Discharge Plan:  Home/Self Care  In-House Referral:     Discharge planning Services  CM Consult  Post Acute Care Choice:    Choice offered to:     DME Arranged:    DME Agency:     HH Arranged:    HH Agency:     Status of Service:  Completed, signed off  If discussed at [employer] Length of Stay Meetings, dates discussed:    Additional Comments:  Lanier ClamMahabir, Farhaan Mabee, RN 04/23/2016, 12:28 PM

## 2016-04-23 NOTE — Plan of Care (Signed)
Problem: Education: Goal: Knowledge of [place] [facility] Education information/materials will improve Outcome: Progressing .  Problem: Safety: Goal: Ability to remain free from injury will improve Outcome: Progressing .  Problem: Health Behavior/Discharge Planning: Goal: Ability to manage health-related needs will improve Outcome: Progressing .  Problem: Pain Managment: Goal: General experience of comfort will improve Outcome: Completed/Met Date Met:  04/23/16 .

## 2016-05-07 ENCOUNTER — Encounter: Payer: Self-pay | Admitting: Adult Health

## 2016-05-07 ENCOUNTER — Ambulatory Visit (INDEPENDENT_AMBULATORY_CARE_PROVIDER_SITE_OTHER): Payer: 59 | Admitting: Adult Health

## 2016-05-07 VITALS — BP 152/70 | Temp 98.6°F | Wt 208.5 lb

## 2016-05-07 DIAGNOSIS — M79601 Pain in right arm: Secondary | ICD-10-CM

## 2016-05-07 NOTE — Patient Instructions (Signed)
It was great meeting you today  I think this is just a strain of the ligament.  Someone from sports medicine will call you to schedule your appointment.   Take mortin 600mg  every 8 hours and continue to ice your arm.   You need to be on light duty until seen by Sports Medicine

## 2016-05-07 NOTE — Progress Notes (Signed)
Subjective:    Patient ID: Gabriel Garcia, male    DOB: 04-13-1976, 40 y.o.   MRN: [id number]  HPI  40 year old male, patient of Dr. Durene Cal, who presents to the office today for pain to the right forearm. He reports that 5 days ago he was at work. He cleans chemical truck hoses. He was pulling out hoses that are about the size of a fire hose. When he was pulling them out he felt a " pop" and burning sensation in his forearm and wrist. He went to bed that night and when he woke up in the morning with bruising down his forearm. He reports a "burning" pain with certain movements, like driving a car as well as a "throbbing pain" when he is going to bed .  He denies any loss of grip strength. He denies any numbness or tingling in arm/hand/fingers.   He denies hitting his hand or arm on anything. No trauma to the area in question  Review of Systems  Constitutional: Positive for activity change.  Musculoskeletal: Positive for myalgias.  Skin: Positive for color change.  Hematological: Negative.   All other systems reviewed and are negative.  Past Medical History:  Diagnosis Date  . Acute duodenal ulcer with hemorrhage 10/12/2014  . GERD (gastroesophageal reflux disease)   . GI bleed   . Hepatitis C   . Hypertension   . 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 Legally blind in right eye, as defined in [place]   . Mesenteric adenitis   . Sliding hiatal hernia   . Substance abuse    cocaine  . Terminal ileitis [facility])     Social History   Social History  . Marital status: Single    Spouse name: N/A  . Number of children: 1  . Years of education: N/A   Occupational History  . dye cutter helper [employer]   Social History Main Topics  . Smoking status: Never Smoker  . Smokeless tobacco: Never Used  . Alcohol use No     Comment: etoh free since 08/2015.  . Drug use: No     Comment: used cocaine before but has been clean since 08/2015.   [name] Sexual activity: No     Other Topics Concern  . Not on file   Social History Narrative   Single.  Lives with his mother.  1 daughter 18 years old in June 2017.       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 Going to concerts    Past Surgical History:  Procedure Laterality Date  . ESOPHAGOGASTRODUODENOSCOPY N/A 10/13/2014   Procedure: ESOPHAGOGASTRODUODENOSCOPY (EGD);  Surgeon: Iva Boop, MD;  Location: [facility];  Service: Endoscopy;  Laterality: N/A;  . EYE SURGERY Right   . FOOT SURGERY Right   . LIVER BIOPSY     Dr. K[facility] Scales [facility]    Family History  Problem Relation Age of Onset  . Rheum arthritis Mother   . Hypertension Mother   . Heart failure Father     rheumatic heart disease, also drinking and smoking  . Colon cancer Neg Hx   . Alcoholism Father     died age 32  . Alcoholism Mother     No Known Allergies  Current Outpatient Prescriptions on File Prior to Visit  Medication Sig Dispense Refill  . HARVONI 90-400 MG TABS Take 1 tablet by mouth  daily.    . lisinopril (PRINIVIL,ZESTRIL) 10 MG tablet Take 1 tablet (10 mg total) by mouth 2 (two) times daily. 90 tablet 2  . ondansetron (ZOFRAN ODT) 4 MG disintegrating tablet Take 1 tablet (4 mg total) by mouth every 8 (eight) hours as needed for nausea or vomiting. 20 tablet 0   No current facility-administered medications on file prior to visit.     BP (!) 152/70 (BP Location: Right Arm)   Temp 98.6 F (37 C) (Oral)   Wt 208 lb 8 oz (94.6 kg)   BMI 29.92 kg/m       Objective:   Physical Exam  Constitutional: He is oriented to person, place, and time. He appears well-developed and well-nourished. No distress.  Cardiovascular: Normal rate, regular rhythm, normal heart sounds and intact distal pulses.  Exam reveals no gallop and no friction rub.   No murmur heard. Pulmonary/Chest: Effort normal and breath sounds normal. No respiratory distress. He has no wheezes. He has no rales. He  exhibits no tenderness.  Musculoskeletal: Normal range of motion. He exhibits no tenderness or deformity.  Slight soft tissue swelling along ulnar side of right forearm. There is pain with palpation to that area as well.   Neurological: He is alert and oriented to person, place, and time.  Skin: Skin is warm and dry. No rash noted. He is not diaphoretic. No erythema. No pallor.  Bruising to wrist and ulnar side of forearm.   Psychiatric: He has a normal mood and affect. His behavior is normal. Judgment and thought content normal.  Nursing note and vitals reviewed.     Assessment & Plan:  1. Right arm pain - Appears to be soft tissue injury - Likely strain of ligament or muscle, possibly Flexor carpi ulnaris. I need to rule out tear or other acute injury   Ambulatory referral to Sports Medicine - ACE bandage applied for compression - Advised Motrin  Q8H - Continue with Ice - Follow up if no improvement  Shirline Frees, NP

## 2016-05-15 ENCOUNTER — Ambulatory Visit: Payer: Self-pay | Admitting: Family Medicine

## 2016-05-15 NOTE — Progress Notes (Deleted)
[facility] [address] [place], [place] [address] Phone: [phone] Subjective:    I'm seeing this patient by the request  of:    CC:   [id number]  Gabriel Garcia is a 40 y.o. male coming in with complaint of ***     Past Medical History:  Diagnosis Date  . Acute duodenal ulcer with hemorrhage 10/12/2014  . GERD (gastroesophageal reflux disease)   . GI bleed   . Hepatitis C   . Hypertension   . 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 Legally blind in right eye, as defined in [place]   . Mesenteric adenitis   . Sliding hiatal hernia   . Substance abuse    cocaine  . Terminal ileitis [facility])    Past Surgical History:  Procedure Laterality Date  . ESOPHAGOGASTRODUODENOSCOPY N/A 10/13/2014   Procedure: ESOPHAGOGASTRODUODENOSCOPY (EGD);  Surgeon: Iva Boop, MD;  Location: [facility];  Service: Endoscopy;  Laterality: N/A;  . EYE SURGERY Right   . FOOT SURGERY Right   . LIVER BIOPSY     Dr. Kinnie Scales GI   Social History   Social History  . Marital status: Single    Spouse name: N/A  . Number of children: 1  . Years of education: N/A   Occupational History  . dye cutter helper [employer]   Social History Main Topics  . Smoking status: Never Smoker  . Smokeless tobacco: Never Used  . Alcohol use No     Comment: etoh free since 08/2015.  . Drug use: No     Comment: used cocaine before but has been clean since 08/2015.   [name] Sexual activity: No   Other Topics Concern  . Not on file   Social History Narrative   Single.  Lives with his mother.  1 daughter 32 years old in June 2017.       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 Going to concerts   No Known Allergies Family History  Problem Relation Age of Onset  . Rheum arthritis Mother   . Hypertension Mother   . Heart failure Father     rheumatic heart disease, also drinking and smoking  . Colon cancer Neg  Hx   . Alcoholism Father     died age 58  . Alcoholism Mother     Past medical history, social, surgical and family history all reviewed in electronic medical record.  No pertanent information unless stated regarding to the chief complaint.   Review of Systems: No headache, visual changes, nausea, vomiting, diarrhea, constipation, dizziness, abdominal pain, skin rash, fevers, chills, night sweats, weight loss, swollen lymph nodes, body aches, joint swelling, muscle aches, chest pain, shortness of breath, mood changes.   Objective  There were no vitals taken for this visit.  General: No apparent distress alert and oriented x3 mood and affect normal, dressed appropriately.  HEENT: Pupils equal, extraocular movements intact  Respiratory: Patient's speak in full sentences and does not appear short of breath  Cardiovascular: No lower extremity edema, non tender, no erythema  Skin: Warm dry intact with no signs of infection or rash on extremities or on axial skeleton.  Abdomen: Soft nontender  Neuro: Cranial nerves II through XII are intact, neurovascularly intact in all extremities with 2+ DTRs and 2+ pulses.  Lymph: No lymphadenopathy of posterior or anterior cervical chain or axillae bilaterally.  Gait normal with good balance and coordination.  MSK:  Non tender with full range of motion and good stability and symmetric strength and tone of shoulders, , wrist, hip, knee and ankles bilaterally.  Elbow: Unremarkable to inspection. Range of motion full pronation, supination, flexion, extension. Strength is full to all of the above directions Stable to varus, valgus stress. Negative moving valgus stress test. No discrete areas of tenderness to palpation. Ulnar nerve does not sublux. Negative cubital tunnel Tinel's.  Wrist: Inspection normal with no visible erythema or swelling. ROM smooth and normal with good flexion and extension and ulnar/radial deviation that is symmetrical with opposite  wrist. Palpation is normal over metacarpals, navicular, lunate, and TFCC; tendons without tenderness/ swelling No snuffbox tenderness. No tenderness over Canal of [place]. Strength 5/5 in all directions without pain. Negative Finkelstein, tinel's and phalens. Negative Watson's test.   MSK US performed of: ** This study was ordered, performed, and interpreted by Terrilee Files D.O.  Wrist: All extensor compartments visualized and tendons all normal in appearance without fraying, tears, or sheath effusions. No effusion seen. TFCC intact. Scapholunate ligament intact. Carpal tunnel visualized and median nerve area normal, flexor tendons all normal in appearance without fraying, tears, or sheath effusions. Power doppler signal normal.  IMPRESSION:  NORMAL ULTRASONOGRAPHIC EXAMINATION OF THE WRIST.     Impression and Recommendations:     This case required medical decision making of moderate complexity.      Note: This dictation was prepared with Dragon dictation along with smaller phrase technology. Any transcriptional errors that result from this process are unintentional.

## 2016-05-16 ENCOUNTER — Ambulatory Visit: Payer: 59 | Admitting: Family Medicine

## 2016-05-28 ENCOUNTER — Ambulatory Visit (INDEPENDENT_AMBULATORY_CARE_PROVIDER_SITE_OTHER): Payer: 59 | Admitting: Family Medicine

## 2016-05-28 ENCOUNTER — Ambulatory Visit (INDEPENDENT_AMBULATORY_CARE_PROVIDER_SITE_OTHER)
Admission: RE | Admit: 2016-05-28 | Discharge: 2016-05-28 | Disposition: A | Discharge status: Home / Self Care | Payer: 59 | Source: Ambulatory Visit | Attending: Family Medicine | Admitting: Family Medicine

## 2016-05-28 ENCOUNTER — Encounter: Payer: Self-pay | Admitting: Family Medicine

## 2016-05-28 VITALS — BP 118/88 | HR 86 | Temp 98.1°F | Wt 215.0 lb

## 2016-05-28 DIAGNOSIS — J40 Bronchitis, not specified as acute or chronic: Secondary | ICD-10-CM

## 2016-05-28 DIAGNOSIS — I10 Essential (primary) hypertension: Secondary | ICD-10-CM | POA: Diagnosis not present

## 2016-05-28 DIAGNOSIS — F1410 Cocaine abuse, uncomplicated: Secondary | ICD-10-CM | POA: Diagnosis not present

## 2016-05-28 DIAGNOSIS — G4733 Obstructive sleep apnea (adult) (pediatric): Secondary | ICD-10-CM

## 2016-05-28 DIAGNOSIS — B182 Chronic viral hepatitis C: Secondary | ICD-10-CM

## 2016-05-28 LAB — COMPREHENSIVE METABOLIC PANEL
ALBUMIN: 4.4 g/dL (ref 3.5–5.2)
ALT: 16 U/L (ref 0–53)
AST: 14 U/L (ref 0–37)
Alkaline Phosphatase: 48 U/L (ref 39–117)
BILIRUBIN TOTAL: 0.4 mg/dL (ref 0.2–1.2)
BUN: 12 mg/dL (ref 6–23)
CALCIUM: 9.8 mg/dL (ref 8.4–10.5)
CO2: 31 meq/L (ref 19–32)
CREATININE: 1.06 mg/dL (ref 0.40–1.50)
Chloride: 105 mEq/L (ref 96–112)
GFR: 82.34 mL/min (ref >60.00)
Glucose, Bld: 98 mg/dL (ref 70–99)
Potassium: 4.1 mEq/L (ref 3.5–5.1)
SODIUM: 140 meq/L (ref 135–145)
Total Protein: 7.4 g/dL (ref 6.0–8.3)

## 2016-05-28 LAB — CBC
HCT: 40.9 % (ref 39.0–52.0)
Hemoglobin: 13.6 g/dL (ref 13.0–17.0)
MCHC: 33.3 g/dL (ref 30.0–36.0)
MCV: 90.6 fl (ref 78.0–100.0)
PLATELETS: 197 10*3/uL (ref 150.0–400.0)
RBC: 4.51 Mil/uL (ref 4.22–5.81)
RDW: 14.7 % (ref 11.5–15.5)
WBC: 6.4 10*3/uL (ref 4.0–10.5)

## 2016-05-28 MED ORDER — LISINOPRIL 20 MG PO TABS: 20.0000 mg | ORAL_TABLET | Freq: Every day | ORAL | 3 refills | Status: DC

## 2016-05-28 NOTE — Patient Instructions (Addendum)
Increase lisinopril to 20mg  since bottom number tends to run on higher side.   Would stop by Dr. Hulen Shoutsutlaw's office to get follow up scheduled.   Blood work today before you leave- stop by [facility] for your follow up x-ray

## 2016-05-28 NOTE — Progress Notes (Signed)
Pre visit review using our clinic review tool, if applicable. No additional management support is needed unless otherwise documented below in the visit note. 

## 2016-05-28 NOTE — Assessment & Plan Note (Signed)
S: Last records I have show. Saw [facility] 01/11/16 Dr. Dominga Ferryutlw-needs Hep B. avoid razors, toothbrush, combs with family. diagnosed 05. HIV neg, hep a prior exposure. ultrasound, genotype, then testing planned. records again 02/2016 with no additional info.   Did an 8 week course of Harvoni per patient. Got a letter and thought he was approved for another 4 weeks for total of 12 weeks. Now off 3-4 weeks was told Dr. Dulce Sellarutlaw said stop at 8 weeks when there was some difficulty with the approval. Plan is 8 weeks follow up from when he stopped- advised him to schedule follow up.  A/P:we will obtain records- since patient knows he needs appointment- asked him to return to GI at this point to get appointment- plans to stop by today

## 2016-05-28 NOTE — Assessment & Plan Note (Signed)
S: OSA diagnosed by Dr. Christene Slatese Dios. Started on cpap around march. Doing well A/P: continue cpap

## 2016-05-28 NOTE — Assessment & Plan Note (Signed)
S: remains alcohol free. Had been90 days free when I saw him February 2nd 2017, since has had relapse treated in  Hospital as also had AKI. Overnight from 7/10 to 04/23/16. Notes [place] detox [place] in [place]- he self enrolled. Patient opted not to do that but is now over 50 days clean again.  A/P: hospitalist asked for CBC, BMP, 2 view CXR due to chronic bronchitic changes I presume- these were ordered today to follow up on AKI and midl anemia noted.

## 2016-05-28 NOTE — Assessment & Plan Note (Signed)
S: controlled on lisinopril 10mg  but initial readings elevated. We discussed option of more tightly controlling BP Readings from Last 3 Encounters:  05/28/16 118/88  05/07/16 (!) 152/70  04/23/16 139/80  A/P:Continue current meds:  But titrate to 20mg . Follow up 3 months

## 2016-05-28 NOTE — Progress Notes (Signed)
Subjective:  Gabriel Garcia is a 40 y.o. year old very pleasant male patient who presents for/with See problem oriented charting ROS- no RUQ pain, no chest pain or shortness of breath. Mild polyuria intermittently..see any ROS included in HPI as well.   Past Medical History-  Patient Active Problem List   Diagnosis Date Noted  . Cocaine abuse 11/16/2015    Priority: High  . Alcoholism (HCC) 11/16/2015    Priority: High  . Hepatitis C     Priority: High  . OSA (obstructive sleep apnea) 05/28/2016    Priority: Medium  . Gout 11/16/2015    Priority: Medium  . Acute duodenal ulcer with hemorrhage 10/12/2014    Priority: Medium  . Acute blood loss anemia 10/12/2014    Priority: Medium  . Hypertension 10/06/2014    Priority: Medium  . Obese 11/17/2015    Priority: Low  . Legally blind in right eye, as defined in [place]     Priority: Low  . Esophageal reflux 11/24/2013    Priority: Low  . EKG abnormality 10/12/2014    Medications- reviewed and updated Current Outpatient Prescriptions  Medication Sig Dispense Refill  . lisinopril (PRINIVIL,ZESTRIL) 20 MG tablet Take 1 tablet (20 mg total) by mouth daily. 90 tablet 3   No current facility-administered medications for this visit.     Objective: BP 118/88   Pulse 86   Temp 98.1 F (36.7 C) (Oral)   Wt 215 lb (97.5 kg)   SpO2 96%   BMI 30.85 kg/m  Gen: NAD, resting comfortably CV: RRR no murmurs rubs or gallops Lungs: CTAB no crackles, wheeze, rhonchi overweight Ext: no edema Skin: warm, dry Neuro: grossly normal, moves all extremities  Assessment/Plan   Hypertension S: controlled on lisinopril 10mg  but initial readings elevated. We discussed option of more tightly controlling BP Readings from Last 3 Encounters:  05/28/16 118/88  05/07/16 (!) 152/70  04/23/16 139/80  A/P:Continue current meds:  But titrate to 20mg . Follow up 3 months   Hepatitis C S: Last records I have show. Saw [facility] 01/11/16 Dr.  Dominga Ferryutlw-needs Hep B. avoid razors, toothbrush, combs with family. diagnosed 05. HIV neg, hep a prior exposure. ultrasound, genotype, then testing planned. records again 02/2016 with no additional info.   Did an 8 week course of Harvoni per patient. Got a letter and thought he was approved for another 4 weeks for total of 12 weeks. Now off 3-4 weeks was told Dr. Dulce Sellarutlaw said stop at 8 weeks when there was some difficulty with the approval. Plan is 8 weeks follow up from when he stopped- advised him to schedule follow up.  A/P:we will obtain records- since patient knows he needs appointment- asked him to return to GI at this point to get appointment- plans to stop by today  Cocaine abuse S: remains alcohol free. Had been90 days free when I saw him February 2nd 2017, since has had relapse treated in  Hospital as also had AKI. Overnight from 7/10 to 04/23/16. Notes state detox program is in [place]- he self enrolled. Patient opted not to do that but is now over 50 days clean again.  A/P: hospitalist asked for CBC, BMP, 2 view CXR due to chronic bronchitic changes I presume- these were ordered today to follow up on AKI and midl anemia noted.   OSA (obstructive sleep apnea) S: OSA diagnosed by Dr. Christene Slatese Dios. Started on cpap around march. Doing well A/P: continue cpap  Patient mentions polyuria at end of  visit. Will check CBG but advised follow up visit to discuss further.   3 month BP check or sooner for above issue  Orders Placed This Encounter  Procedures  . DG Chest 2 View    Standing Status:   Future    Number of Occurrences:   1    Standing Expiration Date:   07/28/2017    Order Specific Question:   Reason for Exam (SYMPTOM  OR DIAGNOSIS REQUIRED)    Answer:   follow up recommneded by hospitalist in discharge summary for bronchitic changes    Order Specific Question:   Preferred imaging location?    Answer:   [address]  . CBC    [place]  . Comprehensive metabolic panel         Meds ordered this encounter  Medications  . lisinopril (PRINIVIL,ZESTRIL) 20 MG tablet    Sig: Take 1 tablet (20 mg total) by mouth daily.    Dispense:  90 tablet    Refill:  3    Return precautions advised.  Tana ConchStephen Francina Beery, MD

## 2016-07-15 ENCOUNTER — Emergency Department (HOSPITAL_COMMUNITY)
Admission: EM | Admit: 2016-07-15 | Discharge: 2016-07-15 | Disposition: A | Discharge status: Home / Self Care | Payer: 59 | Attending: Emergency Medicine | Admitting: Emergency Medicine

## 2016-07-15 ENCOUNTER — Encounter (HOSPITAL_COMMUNITY): Payer: Self-pay | Admitting: Emergency Medicine

## 2016-07-15 ENCOUNTER — Emergency Department (HOSPITAL_COMMUNITY): Payer: 59

## 2016-07-15 DIAGNOSIS — S161XXA Strain of muscle, fascia and tendon at neck level, initial encounter: Secondary | ICD-10-CM | POA: Diagnosis not present

## 2016-07-15 DIAGNOSIS — S199XXA Unspecified injury of neck, initial encounter: Secondary | ICD-10-CM | POA: Diagnosis present

## 2016-07-15 DIAGNOSIS — Y939 Activity, unspecified: Secondary | ICD-10-CM | POA: Insufficient documentation

## 2016-07-15 DIAGNOSIS — Y999 Unspecified external cause status: Secondary | ICD-10-CM | POA: Insufficient documentation

## 2016-07-15 DIAGNOSIS — I10 Essential (primary) hypertension: Secondary | ICD-10-CM | POA: Diagnosis not present

## 2016-07-15 DIAGNOSIS — Z79899 Other long term (current) drug therapy: Secondary | ICD-10-CM | POA: Diagnosis not present

## 2016-07-15 DIAGNOSIS — Y92410 Unspecified street and highway as the place of occurrence of the external cause: Secondary | ICD-10-CM | POA: Insufficient documentation

## 2016-07-15 DIAGNOSIS — S39012A Strain of muscle, fascia and tendon of lower back, initial encounter: Secondary | ICD-10-CM | POA: Diagnosis not present

## 2016-07-15 MED ORDER — IBUPROFEN 800 MG PO TABS
800.0000 mg | ORAL_TABLET | Freq: Once | ORAL | Status: AC
  Administered 2016-07-15: 800 mg via ORAL
  Filled 2016-07-15: qty 1

## 2016-07-15 MED ORDER — ACETAMINOPHEN 500 MG PO TABS
1000.0000 mg | ORAL_TABLET | Freq: Once | ORAL | Status: AC
  Administered 2016-07-15: 1000 mg via ORAL
  Filled 2016-07-15: qty 2

## 2016-07-15 MED ORDER — CYCLOBENZAPRINE HCL 10 MG PO TABS: 10.0000 mg | ORAL_TABLET | Freq: Two times a day (BID) | ORAL | 0 refills | Status: DC | PRN
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
## 2016-08-20 ENCOUNTER — Ambulatory Visit: Payer: Self-pay | Admitting: Family Medicine

## 2016-09-11 ENCOUNTER — Telehealth: Payer: Self-pay | Admitting: Family Medicine

## 2016-09-11 NOTE — Telephone Encounter (Signed)
Called and spoke to patient. He denies chest pain. He does state he has some shortness of breath with exertion. He states he has gained 40lbs over the past 6 months and attributes the weight gain to the shortness of breath.   Advised patient that if his symptoms worsened or he had chest pain or severe shortness of breath to go to the ED. Patient verbalized understanding.

## 2016-09-11 NOTE — Telephone Encounter (Signed)
Pt states he was unable to make his 11/7 appt and now his bp is runniing  155/102.  Pt states you may increase his bp medicine. Pt is scheduled for tomorrow., 1:45 pm, however, pt cannot come in today. Pt states he has been having headaches and sometimes getting dizzy.  Pt would like a call back unless you think can wait and access tomorrow.

## 2016-09-12 ENCOUNTER — Encounter: Payer: Self-pay | Admitting: Family Medicine

## 2016-09-12 ENCOUNTER — Ambulatory Visit (INDEPENDENT_AMBULATORY_CARE_PROVIDER_SITE_OTHER): Payer: 59 | Admitting: Family Medicine

## 2016-09-12 VITALS — BP 142/92 | HR 93 | Temp 97.9°F | Ht 71.0 in | Wt 239.8 lb

## 2016-09-12 DIAGNOSIS — I10 Essential (primary) hypertension: Secondary | ICD-10-CM | POA: Diagnosis not present

## 2016-09-12 DIAGNOSIS — R519 Headache, unspecified: Secondary | ICD-10-CM

## 2016-09-12 DIAGNOSIS — A6000 Herpesviral infection of urogenital system, unspecified: Secondary | ICD-10-CM | POA: Diagnosis not present

## 2016-09-12 DIAGNOSIS — H8110 Benign paroxysmal vertigo, unspecified ear: Secondary | ICD-10-CM

## 2016-09-12 DIAGNOSIS — R51 Headache: Secondary | ICD-10-CM | POA: Diagnosis not present

## 2016-09-12 MED ORDER — LISINOPRIL-HYDROCHLOROTHIAZIDE 20-12.5 MG PO TABS: 1.0000 | ORAL_TABLET | Freq: Every day | ORAL | 5 refills | Status: DC

## 2016-09-12 MED ORDER — VALACYCLOVIR HCL 500 MG PO TABS: 500.0000 mg | ORAL_TABLET | Freq: Two times a day (BID) | ORAL | 0 refills | Status: DC

## 2016-09-12 NOTE — Patient Instructions (Addendum)
stop lisinopril 20mg . Start lisinopril-hctz 20-12.5mg  and follow up in 2 weeks.   We will call you within a week about your referral to vestibular rehab. If you do not hear within 2 weeks, give us a call.   Valtrex sent in- use twice a day for 3 days with flares  If new or worsening symptoms see you sooner  If headaches not better by follow up consider shot and prednisone

## 2016-09-12 NOTE — Assessment & Plan Note (Signed)
S: for 1 week he has been very dizzy with head movement. 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 No worsening dizziness.  O: dix hallpike negative, neuro exam reassuring as above A/P: will send to vestibular rehab and follow up 2 weeks

## 2016-09-12 NOTE — Assessment & Plan Note (Signed)
S: genital herpes not recently discussed- outbreak within last week. ppainful bumps throughout groin A/P: valtrex rx given

## 2016-09-12 NOTE — Progress Notes (Signed)
Pre visit review using our clinic review tool, if applicable. No additional management support is needed unless otherwise documented below in the visit note. 

## 2016-09-12 NOTE — Assessment & Plan Note (Addendum)
S: controlled poorly on lisinopril 20mg . Home #s usually 50s BP Readings from Last 3 Encounters:  09/12/16 (!) 142/92  07/15/16 142/95  05/28/16 118/88  A/P: stop lisinopril 20mg . Start lisinopril-hctz 20-12.5mg  and follow up in 2 weeks. Also weight up 40-50 lbs since quitting cocaine and alcohol- noted HA- posisble relation. Also noted occasional windedness likely weight related and palpitations at times- consider EKG at follow up

## 2016-09-12 NOTE — Progress Notes (Signed)
Subjective:  Gabriel Garcia is a 40 y.o. year old very pleasant male patient who presents for/with See problem oriented charting ROS- no chest pain, diaphoresis, nausea. Does have some mild windedness with activity.see any ROS included in HPI as well.   Past Medical History-  Patient Active Problem List   Diagnosis Date Noted  . Cocaine abuse 11/16/2015    Priority: High  . Alcoholism (HCC) 11/16/2015    Priority: High  . Hepatitis C     Priority: High  . OSA (obstructive sleep apnea) 05/28/2016    Priority: Medium  . Gout 11/16/2015    Priority: Medium  . Acute duodenal ulcer with hemorrhage 10/12/2014    Priority: Medium  . Acute blood loss anemia 10/12/2014    Priority: Medium  . Hypertension 10/06/2014    Priority: Medium  . Obese 11/17/2015    Priority: Low  . Legally blind in right eye, as defined in [place]     Priority: Low  . Esophageal reflux 11/24/2013    Priority: Low  . BPPV (benign paroxysmal positional vertigo) 09/12/2016  . Genital herpes 09/12/2016  . EKG abnormality 10/12/2014    Medications- reviewed and updated Current Outpatient Prescriptions  Medication Sig Dispense Refill  . lisinopril-hydrochlorothiazide (PRINZIDE,ZESTORETIC) 20-12.5 MG tablet Take 1 tablet by mouth daily. 31 tablet 5  . valACYclovir (VALTREX) 500 MG tablet Take 1 tablet (500 mg total) by mouth 2 (two) times daily. For 3 days with outbreaks 30 tablet 0   No current facility-administered medications for this visit.     Objective: BP (!) 142/92   Pulse 93   Temp 97.9 F (36.6 C) (Oral)   Ht 5\' 11"  (1.803 m)   Wt 239 lb 12.8 oz (108.8 kg)   SpO2 97%   BMI 33.45 kg/m  Gen: NAD, resting comfortably Some muscle tightness in neck CV: RRR no murmurs rubs or gallops Lungs: CTAB no crackles, wheeze, rhonchi Ext: no edema Neuro: CN II-XII intact, sensation and reflexes normal throughout, 5/5 muscle strength in bilateral upper and lower extremities. Normal finger to nose. Normal  rapid alternating movements. No pronator drift. Normal romberg. Normal gait.   Assessment/Plan:  Headache Vertigo S: also with headache in his neck/back of head for 2 months. Tylenol and goody  Powder help but then recurs. Has been alcohol and drug free now for 6 months. Rated as mild to moderate aching. Has had some headaches in the past but usually not this persistent.   A/P: I do not think this is BP related but BP has been up when had HA so start by treating BP more effectively- follow up in 2 weeks to reassess. Reassuring neuro exam. Would consider toradol/prednisone combo at follow up.    Hypertension S: controlled poorly on lisinopril 20mg . Home #s usually 50s BP Readings from Last 3 Encounters:  09/12/16 (!) 142/92  07/15/16 142/95  05/28/16 118/88  A/P: stop lisinopril 20mg . Start lisinopril-hctz 20-12.5mg  and follow up in 2 weeks. Also weight up 40-50 lbs since quitting cocaine and alcohol- noted HA- posisble relation. Also noted occasional windedness likely weight related and palpitations at times- consider EKG at follow up   BPPV (benign paroxysmal positional vertigo) S: for 1 week he has been very dizzy with head movement. 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 No worsening dizziness.  O: dix hallpike negative, neuro exam reassuring as above A/P: will send to vestibular rehab and follow up 2 weeks   Genital herpes S:  genital herpes not recently discussed- outbreak within last week. ppainful bumps throughout groin A/P: valtrex rx given   2 weeks  Orders Placed This Encounter  Procedures  . Ambulatory referral to Physical Therapy    Referral Priority:   Routine    Referral Type:   Physical Medicine    Referral Reason:   Specialty Services Required    Requested Specialty:   Physical Therapy    Number of Visits Requested:   1    Meds ordered this encounter  Medications  . lisinopril-hydrochlorothiazide (PRINZIDE,ZESTORETIC)  20-12.5 MG tablet    Sig: Take 1 tablet by mouth daily.    Dispense:  31 tablet    Refill:  5  . valACYclovir (VALTREX) 500 MG tablet    Sig: Take 1 tablet (500 mg total) by mouth 2 (two) times daily. For 3 days with outbreaks    Dispense:  30 tablet    Refill:  0    Return precautions advised.  Tana ConchStephen Kirstein Baxley, MD

## 2016-10-04 ENCOUNTER — Ambulatory Visit (INDEPENDENT_AMBULATORY_CARE_PROVIDER_SITE_OTHER): Payer: 59 | Admitting: Family Medicine

## 2016-10-04 ENCOUNTER — Encounter: Payer: Self-pay | Admitting: Family Medicine

## 2016-10-04 VITALS — BP 108/80 | HR 88 | Temp 97.7°F | Wt 242.0 lb

## 2016-10-04 DIAGNOSIS — I10 Essential (primary) hypertension: Secondary | ICD-10-CM

## 2016-10-04 DIAGNOSIS — R739 Hyperglycemia, unspecified: Secondary | ICD-10-CM | POA: Diagnosis not present

## 2016-10-04 NOTE — Progress Notes (Signed)
Pre visit review using our clinic review tool, if applicable. No additional management support is needed unless otherwise documented below in the visit note. 

## 2016-10-04 NOTE — Progress Notes (Signed)
Subjective:  Gabriel Garcia is a 40 y.o. year old very pleasant male patient who presents for/with See problem oriented charting ROS- No chest pain. No headache or blurry vision.  No vertigo. Polyuria. No dysuria. No rectal pain  Past Medical History-  Patient Active Problem List   Diagnosis Date Noted  . Cocaine abuse 11/16/2015    Priority: High  . Alcoholism (HCC) 11/16/2015    Priority: High  . Hepatitis C     Priority: High  . OSA (obstructive sleep apnea) 05/28/2016    Priority: Medium  . Gout 11/16/2015    Priority: Medium  . Acute duodenal ulcer with hemorrhage 10/12/2014    Priority: Medium  . Acute blood loss anemia 10/12/2014    Priority: Medium  . Hypertension 10/06/2014    Priority: Medium  . Obese 11/17/2015    Priority: Low  . Legally blind in right eye, [place] defined in [place]     Priority: Low  . Esophageal reflux 11/24/2013    Priority: Low  . BPPV (benign paroxysmal positional vertigo) 09/12/2016  . Genital herpes 09/12/2016  . EKG abnormality 10/12/2014    Medications- reviewed and updated Current Outpatient Prescriptions  Medication Sig Dispense Refill  . lisinopril-hydrochlorothiazide (PRINZIDE,ZESTORETIC) 20-12.5 MG tablet Take 1 tablet by mouth daily. 31 tablet 5  . valACYclovir (VALTREX) 500 MG tablet Take 1 tablet (500 mg total) by mouth 2 (two) times daily. For 3 days with outbreaks 30 tablet 0   No current facility-administered medications for this visit.     Objective: BP 108/80   Pulse 88   Temp 97.7 F (36.5 C) (Oral)   Wt 242 lb (109.8 kg)   SpO2 98%   BMI 33.75 kg/m  Gen: NAD, resting comfortably CV: RRR no murmurs rubs or gallops Lungs: CTAB no crackles, wheeze, rhonchi  Ext: no edema Skin: warm, dry  Assessment/Plan:  Hypertension S: controlled on lisinopril 20-12.5mg  (from lisinopril 20mg  alone).  BP Readings from Last 3 Encounters:  10/04/16 108/80  09/12/16 (!) 142/92  07/15/16 142/95  A/P:Continue current meds:   Drastic improvement. Also with improved BP has had resolution of headaches. Has been over a week since vertigo. Not noticing winded feeling as prominent as prior.   Hyperglycemia-  S: worried about his blood sugar, was told by Hep C clinic had high sugar- 135 at least may have been 195. Increased urination. No burning with peeing. Present for at least 3-4 months A/P: we got an a1c today which was not elevated. Offered UA but he declines today- will consider in future or if worsening symptoms  Return precautions advised.  Tana ConchStephen Mohamed Portlock, MD

## 2016-10-04 NOTE — Assessment & Plan Note (Signed)
S: controlled on lisinopril 20-12.5mg  (from lisinopril 20mg  alone).  BP Readings from Last 3 Encounters:  10/04/16 108/80  09/12/16 (!) 142/92  07/15/16 142/95  A/P:Continue current meds:  Drastic improvement. Also with improved BP has had resolution of headaches. Has been over a week since vertigo. Not noticing winded feeling as prominent as prior.

## 2016-10-04 NOTE — Patient Instructions (Signed)
Blood pressure looks much better  No diabetes with a1c 5.3. (at risk is 5.7-6.4 so you are not even in that range) BUT you do need to eat healthy. If you develop new or worsening symptoms return to see Gabriel Garcia

## 2016-10-08 ENCOUNTER — Other Ambulatory Visit: Payer: Self-pay | Admitting: Family Medicine

## 2016-10-08 DIAGNOSIS — E119 Type 2 diabetes mellitus without complications: Secondary | ICD-10-CM

## 2016-10-08 LAB — POCT GLYCOSYLATED HEMOGLOBIN (HGB A1C): HEMOGLOBIN A1C: 5.3

## 2016-10-08 NOTE — Addendum Note (Signed)
Addended by: Neville RouteJAIMES, Fumiye Lubben G on: 10/08/2016 09:12 AM   Modules accepted: Orders

## 2016-10-11 ENCOUNTER — Emergency Department (HOSPITAL_COMMUNITY)
Admission: EM | Admit: 2016-10-11 | Discharge: 2016-10-11 | Disposition: A | Discharge status: Home / Self Care | Payer: 59 | Attending: Emergency Medicine | Admitting: Emergency Medicine

## 2016-10-11 ENCOUNTER — Encounter (HOSPITAL_COMMUNITY): Payer: Self-pay | Admitting: Emergency Medicine

## 2016-10-11 DIAGNOSIS — I10 Essential (primary) hypertension: Secondary | ICD-10-CM | POA: Diagnosis not present

## 2016-10-11 DIAGNOSIS — M109 Gout, unspecified: Secondary | ICD-10-CM | POA: Diagnosis not present

## 2016-10-11 DIAGNOSIS — M79674 Pain in right toe(s): Secondary | ICD-10-CM | POA: Diagnosis present

## 2016-10-11 DIAGNOSIS — Z79899 Other long term (current) drug therapy: Secondary | ICD-10-CM | POA: Insufficient documentation

## 2016-10-11 MED ORDER — INDOMETHACIN 50 MG PO CAPS
50.0000 mg | ORAL_CAPSULE | Freq: Once | ORAL | Status: AC
  Administered 2016-10-11: 50 mg via ORAL
  Filled 2016-10-11: qty 1

## 2016-10-11 MED ORDER — INDOMETHACIN 25 MG PO CAPS: 25.0000 mg | ORAL_CAPSULE | Freq: Three times a day (TID) | ORAL | 0 refills | Status: DC | PRN

## 2016-10-11 NOTE — ED Triage Notes (Signed)
Pt states he is having pain in his right great toe  Pt has hx of gout and states this feels the same

## 2016-10-12 NOTE — ED Provider Notes (Signed)
[facility]-EMERGENCY DEPT Provider Note   CSN: [id number] Arrival date & time: 10/11/16  0501     History   Chief Complaint Chief Complaint  Patient presents with  . Toe Pain    HPI Gabriel Garcia is a 40 y.o. male.  Patient presents with pain and redness to right 1st MTP joint x 2-3 days. Symptoms similar to previous gout flares. No injury or fever. No other joint pain.    The history is provided by the patient. No language interpreter was used.  Toe Pain     Past Medical History:  Diagnosis Date  . Acute duodenal ulcer with hemorrhage 10/12/2014  . GERD (gastroesophageal reflux disease)   . GI bleed   . Hepatitis C   . Hypertension   . 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 Legally blind in right eye, as defined in [place]   . Mesenteric adenitis   . Sliding hiatal hernia   . Substance abuse    cocaine  . Terminal ileitis [facility])     Patient Active Problem List   Diagnosis Date Noted  . BPPV (benign paroxysmal positional vertigo) 09/12/2016  . Genital herpes 09/12/2016  . OSA (obstructive sleep apnea) 05/28/2016  . Obese 11/17/2015  . Cocaine abuse 11/16/2015  . Alcoholism (HCC) 11/16/2015  . Gout 11/16/2015  . Legally blind in right eye, as defined in [place]   . Acute duodenal ulcer with hemorrhage 10/12/2014  . EKG abnormality 10/12/2014  . Acute blood loss anemia 10/12/2014  . Hypertension 10/06/2014  . Esophageal reflux 11/24/2013  . Hepatitis C     Past Surgical History:  Procedure Laterality Date  . ESOPHAGOGASTRODUODENOSCOPY N/A 10/13/2014   Procedure: ESOPHAGOGASTRODUODENOSCOPY (EGD);  Surgeon: Iva Boop, MD;  Location: [facility];  Service: Endoscopy;  Laterality: N/A;  . EYE SURGERY Right   . FOOT SURGERY Right   . LIVER BIOPSY     Dr. Kinnie Scales GI       Home Medications    Prior to Admission medications   Medication Sig Start Date End Date Taking? Authorizing Provider  indomethacin (INDOCIN) 25 MG capsule Take 1  capsule (25 mg total) by mouth 3 (three) times daily as needed. 10/11/16   Elpidio Anis, PA-C  lisinopril-hydrochlorothiazide (PRINZIDE,ZESTORETIC) 20-12.5 MG tablet Take 1 tablet by mouth daily. 09/12/16   [name], MD  valACYclovir (VALTREX) 500 MG tablet Take 1 tablet (500 mg total) by mouth 2 (two) times daily. For 3 days with outbreaks 09/12/16   [name], MD    Family History Family History  Problem Relation Age of Onset  . Rheum arthritis Mother   . Hypertension Mother   . Alcoholism Mother   . Heart failure Father     rheumatic heart disease, also drinking and smoking  . Alcoholism Father     died age 59  . Colon cancer Neg Hx     Social History Social History  Substance Use Topics  . Smoking status: Never Smoker  . Smokeless tobacco: Never Used  . Alcohol use No     Comment: etoh free since 08/2015.     Allergies   Patient has no known allergies.   Review of Systems Review of Systems  Constitutional: Negative for fever.  Musculoskeletal:       See HPI  Skin: Positive for color change.  Neurological: Negative.  Negative for numbness.     Physical Exam Updated Vital Signs BP 163/99 (BP Location: Left  Arm)   Pulse 80   Temp 97.5 F (36.4 C)   Resp 18   Ht 5\' 11"  (1.803 m)   Wt 104.3 kg   SpO2 98%   BMI 32.08 kg/m   Physical Exam  Constitutional: He is oriented to person, place, and time. He appears well-developed and well-nourished.  Neck: Normal range of motion.  Pulmonary/Chest: Effort normal.  Musculoskeletal: Normal range of motion.  Right foot shows no significant swelling. Focal tenderness to 1st MTP joint with redness. No ulceration or wound.   Neurological: He is alert and oriented to person, place, and time.  Skin: Skin is warm and dry.  Psychiatric: He has a normal mood and affect.     ED Treatments / Results  Labs (all labs ordered are listed, but only abnormal results are displayed) Labs Reviewed - No data to  display  EKG  EKG Interpretation None       Radiology No results found.  Procedures Procedures (including critical care time)  Medications Ordered in ED Medications  indomethacin (INDOCIN) capsule 50 mg (50 mg Oral Given 10/11/16 0554)     Initial Impression / Assessment and Plan / ED Course  I have reviewed the triage vital signs and the nursing notes.  Pertinent labs & imaging results that were available during my care of the patient were reviewed by me and considered in my medical decision making (see chart for details).  Clinical Course     Patient c/o painful 1st MTP joint on right foot c/w gouty arthritis. Indomethacin provided.   Final Clinical Impressions(s) / ED Diagnoses   Final diagnoses:  Acute gout involving toe of right foot, unspecified cause    New Prescriptions Discharge Medication List as of 10/11/2016  5:49 AM    START taking these medications   Details  indomethacin (INDOCIN) 25 MG capsule Take 1 capsule (25 mg total) by mouth 3 (three) times daily as needed., Starting Fri 10/11/2016, Print         Elpidio AnisShari Leighann Amadon, PA-C 10/12/16 0110    Derwood KaplanAnkit Nanavati, MD 10/12/16 [phone]

## 2016-10-28 ENCOUNTER — Encounter: Payer: Self-pay | Admitting: Family Medicine

## 2016-10-28 ENCOUNTER — Ambulatory Visit (INDEPENDENT_AMBULATORY_CARE_PROVIDER_SITE_OTHER): Payer: 59 | Admitting: Family Medicine

## 2016-10-28 VITALS — BP 118/80 | HR 115 | Temp 98.2°F | Wt 241.8 lb

## 2016-10-28 DIAGNOSIS — J069 Acute upper respiratory infection, unspecified: Secondary | ICD-10-CM

## 2016-10-28 DIAGNOSIS — R05 Cough: Secondary | ICD-10-CM

## 2016-10-28 DIAGNOSIS — R6883 Chills (without fever): Secondary | ICD-10-CM | POA: Diagnosis not present

## 2016-10-28 DIAGNOSIS — R059 Cough, unspecified: Secondary | ICD-10-CM

## 2016-10-28 LAB — POCT INFLUENZA A/B: INFLUENZA A, POC: NEGATIVE

## 2016-10-28 MED ORDER — METHYLPREDNISOLONE ACETATE 80 MG/ML IJ SUSP
80.0000 mg | Freq: Once | INTRAMUSCULAR | Status: AC
  Administered 2016-10-28: 80 mg via INTRAMUSCULAR

## 2016-10-28 NOTE — Patient Instructions (Signed)
Flu-like illness History and exam today are suggestive of viral process. Patients influenza test was negative.  Pretest probability of influenza was moderate.   Patient will not be treated with Tamiflu. Symptomatic treatment with: rest, hydration, depo medrol 80mg - antiinflammatory may help with some of his myalgias as well as congestion. Would stay out of work this week.   Finally, we reviewed reasons to return to care including if symptoms worsen or persist or new concerns arise, particularly if gets fever or shortness of breath

## 2016-10-28 NOTE — Progress Notes (Signed)
PCP: Tana Conch, MD  Subjective:  Gabriel Garcia is a 41 y.o. year old very pleasant male patient who presents with flu like symptoms including cough, chills ,body aches.  -started: friday -inside 48 hour treatment window if needed for tamiflu: no -high risk condition (children <5, adults >65, chronic pulmonary or cardiac condition, immunosuppression, pregnancy, nursing home resident, morbid obesity) : no -symptoms have been largely stable since onset, no improvement yet -previous treatments: nyquil, alka seltzer cold and cough - endorses sick contact; specifically influenza: yes. Mom had influenza around 1st of year but she has been much better and no contact with someone with flu within last 10 days.   ROS-denies SOB, NVD, sinus or dental pain  Pertinent Past Medical History-  Patient Active Problem List   Diagnosis Date Noted  . Cocaine abuse 11/16/2015    Priority: High  . Alcoholism (HCC) 11/16/2015    Priority: High  . Hepatitis C     Priority: High  . OSA (obstructive sleep apnea) 05/28/2016    Priority: Medium  . Gout 11/16/2015    Priority: Medium  . Acute duodenal ulcer with hemorrhage 10/12/2014    Priority: Medium  . Acute blood loss anemia 10/12/2014    Priority: Medium  . Hypertension 10/06/2014    Priority: Medium  . Obese 11/17/2015    Priority: Low  . Legally blind in right eye, as defined in [place]     Priority: Low  . Esophageal reflux 11/24/2013    Priority: Low  . BPPV (benign paroxysmal positional vertigo) 09/12/2016  . Genital herpes 09/12/2016  . EKG abnormality 10/12/2014    Medications- reviewed  Current Outpatient Prescriptions  Medication Sig Dispense Refill  . indomethacin (INDOCIN) 25 MG capsule Take 1 capsule (25 mg total) by mouth 3 (three) times daily as needed. 30 capsule 0  . lisinopril-hydrochlorothiazide (PRINZIDE,ZESTORETIC) 20-12.5 MG tablet Take 1 tablet by mouth daily. 31 tablet 5  . valACYclovir (VALTREX) 500 MG tablet  Take 1 tablet (500 mg total) by mouth 2 (two) times daily. For 3 days with outbreaks 30 tablet 0   No current facility-administered medications for this visit.     Objective: BP 118/80 (BP Location: Left Arm, Patient Position: Sitting, Cuff Size: Normal)   Pulse (!) 115   Temp 98.2 F (36.8 C) (Oral)   Wt 241 lb 12.8 oz (109.7 kg)   SpO2 98%   BMI 33.72 kg/m  Gen: NAD, appears fatigued HEENT: Turbinates erythematous, TM normal, pharynx mildly erythematous with no tonsilar exudate or edema, no sinus tenderness CV: RRR no murmurs rubs or gallops Lungs: CTAB no crackles, wheeze, rhonchi Abdomen: soft/nontender/nondistended/normal bowel sounds. Ext: no edema Skin: warm, dry, no rash  Results for orders placed or performed in visit on 10/28/16 (from the past 24 hour(s))  POCT Influenza A/B     Status: None   Collection Time: 10/28/16  4:45 PM  Result Value Ref Range   Influenza A, POC Negative Negative   Influenza B, POC  Negative    Assessment/Plan:  Flu-like illness/cough/chills History and exam today are suggestive of viral process. Patients influenza test was negative.  Pretest probability of influenza was moderate.   Patient will not be treated with Tamiflu. Symptomatic treatment with: rest, hydration, depo medrol 80mg - antiinflammatory may help with some of his myalgias as well as congestion. Would stay out of work this week.   Finally, we reviewed reasons to return to care including if symptoms worsen or persist or new concerns arise.  Meds ordered this encounter  Medications  . methylPREDNISolone acetate (DEPO-MEDROL) injection 80 mg    Tana ConchStephen Hunter, MD

## 2016-11-17 ENCOUNTER — Encounter (HOSPITAL_COMMUNITY)
Admission: EM | Disposition: A | Discharge status: Home / Self Care | Payer: Self-pay | Source: Home / Self Care | Attending: Internal Medicine

## 2016-11-17 ENCOUNTER — Encounter (HOSPITAL_COMMUNITY): Payer: Self-pay | Admitting: Emergency Medicine

## 2016-11-17 ENCOUNTER — Inpatient Hospital Stay (HOSPITAL_COMMUNITY)
Admission: EM | Admit: 2016-11-17 | Discharge: 2016-11-20 | DRG: 369 | Disposition: A | Discharge status: Home / Self Care | Payer: 59 | Attending: Internal Medicine | Admitting: Internal Medicine

## 2016-11-17 DIAGNOSIS — K921 Melena: Secondary | ICD-10-CM | POA: Diagnosis present

## 2016-11-17 DIAGNOSIS — I9589 Other hypotension: Secondary | ICD-10-CM

## 2016-11-17 DIAGNOSIS — F1020 Alcohol dependence, uncomplicated: Secondary | ICD-10-CM | POA: Diagnosis present

## 2016-11-17 DIAGNOSIS — Z811 Family history of alcohol abuse and dependence: Secondary | ICD-10-CM

## 2016-11-17 DIAGNOSIS — Z8711 Personal history of peptic ulcer disease: Secondary | ICD-10-CM | POA: Diagnosis not present

## 2016-11-17 DIAGNOSIS — Z8249 Family history of ischemic heart disease and other diseases of the circulatory system: Secondary | ICD-10-CM

## 2016-11-17 DIAGNOSIS — K920 Hematemesis: Secondary | ICD-10-CM | POA: Diagnosis not present

## 2016-11-17 DIAGNOSIS — Z8261 Family history of arthritis: Secondary | ICD-10-CM

## 2016-11-17 DIAGNOSIS — B1920 Unspecified viral hepatitis C without hepatic coma: Secondary | ICD-10-CM | POA: Diagnosis present

## 2016-11-17 DIAGNOSIS — K922 Gastrointestinal hemorrhage, unspecified: Secondary | ICD-10-CM | POA: Diagnosis not present

## 2016-11-17 DIAGNOSIS — K449 Diaphragmatic hernia without obstruction or gangrene: Secondary | ICD-10-CM | POA: Diagnosis present

## 2016-11-17 DIAGNOSIS — G4733 Obstructive sleep apnea (adult) (pediatric): Secondary | ICD-10-CM | POA: Diagnosis present

## 2016-11-17 DIAGNOSIS — K210 Gastro-esophageal reflux disease with esophagitis: Secondary | ICD-10-CM | POA: Diagnosis present

## 2016-11-17 DIAGNOSIS — F1490 Cocaine use, unspecified, uncomplicated: Secondary | ICD-10-CM | POA: Diagnosis present

## 2016-11-17 DIAGNOSIS — R58 Hemorrhage, not elsewhere classified: Secondary | ICD-10-CM

## 2016-11-17 DIAGNOSIS — R000 Tachycardia, unspecified: Secondary | ICD-10-CM | POA: Diagnosis not present

## 2016-11-17 DIAGNOSIS — N179 Acute kidney failure, unspecified: Secondary | ICD-10-CM | POA: Diagnosis not present

## 2016-11-17 DIAGNOSIS — Z79899 Other long term (current) drug therapy: Secondary | ICD-10-CM

## 2016-11-17 DIAGNOSIS — Z9889 Other specified postprocedural states: Secondary | ICD-10-CM

## 2016-11-17 DIAGNOSIS — K2900 Acute gastritis without bleeding: Secondary | ICD-10-CM | POA: Diagnosis present

## 2016-11-17 DIAGNOSIS — I959 Hypotension, unspecified: Secondary | ICD-10-CM | POA: Diagnosis not present

## 2016-11-17 DIAGNOSIS — K226 Gastro-esophageal laceration-hemorrhage syndrome: Secondary | ICD-10-CM | POA: Diagnosis not present

## 2016-11-17 DIAGNOSIS — E860 Dehydration: Secondary | ICD-10-CM | POA: Diagnosis present

## 2016-11-17 DIAGNOSIS — H5461 Unqualified visual loss, right eye, normal vision left eye: Secondary | ICD-10-CM | POA: Diagnosis present

## 2016-11-17 DIAGNOSIS — F1021 Alcohol dependence, in remission: Secondary | ICD-10-CM | POA: Diagnosis present

## 2016-11-17 DIAGNOSIS — K219 Gastro-esophageal reflux disease without esophagitis: Secondary | ICD-10-CM | POA: Diagnosis present

## 2016-11-17 DIAGNOSIS — D62 Acute posthemorrhagic anemia: Secondary | ICD-10-CM | POA: Diagnosis present

## 2016-11-17 DIAGNOSIS — K269 Duodenal ulcer, unspecified as acute or chronic, without hemorrhage or perforation: Secondary | ICD-10-CM | POA: Diagnosis not present

## 2016-11-17 DIAGNOSIS — I10 Essential (primary) hypertension: Secondary | ICD-10-CM | POA: Diagnosis present

## 2016-11-17 DIAGNOSIS — B1710 Acute hepatitis C without hepatic coma: Secondary | ICD-10-CM | POA: Diagnosis not present

## 2016-11-17 DIAGNOSIS — Z8719 Personal history of other diseases of the digestive system: Secondary | ICD-10-CM

## 2016-11-17 DIAGNOSIS — R112 Nausea with vomiting, unspecified: Secondary | ICD-10-CM | POA: Diagnosis not present

## 2016-11-17 HISTORY — PX: ESOPHAGOGASTRODUODENOSCOPY: SHX5428

## 2016-11-17 LAB — COMPREHENSIVE METABOLIC PANEL
ALBUMIN: 4.1 g/dL (ref 3.5–5.0)
ALT: 24 U/L (ref 17–63)
AST: 24 U/L (ref 15–41)
Alkaline Phosphatase: 50 U/L (ref 38–126)
Anion gap: 15 (ref 5–15)
BUN: 35 mg/dL — AB (ref 6–20)
CHLORIDE: 102 mmol/L (ref 101–111)
CO2: 20 mmol/L — AB (ref 22–32)
CREATININE: 2.01 mg/dL — AB (ref 0.61–1.24)
Calcium: 10.6 mg/dL — ABNORMAL HIGH (ref 8.9–10.3)
GFR calc Af Amer: 46 mL/min — ABNORMAL LOW (ref >60)
GFR calc non Af Amer: 40 mL/min — ABNORMAL LOW (ref >60)
Glucose, Bld: 166 mg/dL — ABNORMAL HIGH (ref 65–99)
Potassium: 4.6 mmol/L (ref 3.5–5.1)
SODIUM: 137 mmol/L (ref 135–145)
Total Bilirubin: 1.2 mg/dL (ref 0.3–1.2)
Total Protein: 7.3 g/dL (ref 6.5–8.1)

## 2016-11-17 LAB — RAPID URINE DRUG SCREEN, HOSP PERFORMED
Amphetamines: NOT DETECTED
Barbiturates: NOT DETECTED
Benzodiazepines: POSITIVE — AB
Cocaine: POSITIVE — AB
Opiates: NOT DETECTED
Tetrahydrocannabinol: NOT DETECTED

## 2016-11-17 LAB — CBC
HCT: 39.5 % (ref 39.0–52.0)
HEMATOCRIT: 42.4 % (ref 39.0–52.0)
Hemoglobin: 14 g/dL (ref 13.0–17.0)
Hemoglobin: 12.9 g/dL — ABNORMAL LOW (ref 13.0–17.0)
MCH: 31 pg (ref 26.0–34.0)
MCH: 30.6 pg (ref 26.0–34.0)
MCHC: 33 g/dL (ref 30.0–36.0)
MCHC: 32.7 g/dL (ref 30.0–36.0)
MCV: 93.8 fL (ref 78.0–100.0)
MCV: 93.8 fL (ref 78.0–100.0)
Platelets: 331 10*3/uL (ref 150–400)
Platelets: 247 10*3/uL (ref 150–400)
RBC: 4.52 MIL/uL (ref 4.22–5.81)
RBC: 4.21 MIL/uL — ABNORMAL LOW (ref 4.22–5.81)
RDW: 14.2 % (ref 11.5–15.5)
RDW: 14.9 % (ref 11.5–15.5)
WBC: 19.3 10*3/uL — ABNORMAL HIGH (ref 4.0–10.5)
WBC: 20.1 10*3/uL — ABNORMAL HIGH (ref 4.0–10.5)

## 2016-11-17 LAB — URINALYSIS, ROUTINE W REFLEX MICROSCOPIC
BILIRUBIN URINE: NEGATIVE
GLUCOSE, UA: NEGATIVE mg/dL
HGB URINE DIPSTICK: NEGATIVE
Ketones, ur: 5 mg/dL — AB
Leukocytes, UA: NEGATIVE
Nitrite: NEGATIVE
PH: 5 (ref 5.0–8.0)
Protein, ur: NEGATIVE mg/dL
Specific Gravity, Urine: 1.021 (ref 1.005–1.030)

## 2016-11-17 LAB — PROTIME-INR
INR: 1.1
Prothrombin Time: 14.2 s (ref 11.4–15.2)

## 2016-11-17 LAB — APTT: aPTT: 24 s (ref 24–36)

## 2016-11-17 LAB — ABO/RH: ABO/RH(D): A POS

## 2016-11-17 LAB — PREPARE RBC (CROSSMATCH)

## 2016-11-17 LAB — POC OCCULT BLOOD, ED: Fecal Occult Bld: NEGATIVE

## 2016-11-17 LAB — MRSA PCR SCREENING: MRSA by PCR: NEGATIVE

## 2016-11-17 SURGERY — EGD (ESOPHAGOGASTRODUODENOSCOPY)
Anesthesia: Moderate Sedation

## 2016-11-17 MED ORDER — MIDAZOLAM HCL 5 MG/ML IJ SOLN
INTRAMUSCULAR | Status: AC
  Filled 2016-11-17: qty 2

## 2016-11-17 MED ORDER — ONDANSETRON HCL 4 MG PO TABS: 4.0000 mg | ORAL_TABLET | Freq: Four times a day (QID) | ORAL | Status: DC | PRN

## 2016-11-17 MED ORDER — ONDANSETRON HCL 4 MG/2ML IJ SOLN
4.0000 mg | Freq: Once | INTRAMUSCULAR | Status: AC
  Administered 2016-11-17: 4 mg via INTRAVENOUS
  Filled 2016-11-17: qty 2

## 2016-11-17 MED ORDER — SODIUM CHLORIDE 0.9 % IV SOLN
INTRAVENOUS | Status: AC
  Administered 2016-11-17: 14:00:00 via INTRAVENOUS

## 2016-11-17 MED ORDER — DIPHENHYDRAMINE HCL 50 MG/ML IJ SOLN
INTRAMUSCULAR | Status: AC
  Filled 2016-11-17: qty 1

## 2016-11-17 MED ORDER — SODIUM CHLORIDE 0.9 % IV SOLN
80.0000 mg | Freq: Once | INTRAVENOUS | Status: AC
  Administered 2016-11-17: 80 mg via INTRAVENOUS
  Filled 2016-11-17: qty 80

## 2016-11-17 MED ORDER — SODIUM CHLORIDE 0.9 % IV BOLUS (SEPSIS)
1000.0000 mL | Freq: Once | INTRAVENOUS | Status: AC
  Administered 2016-11-17: 1000 mL via INTRAVENOUS

## 2016-11-17 MED ORDER — SODIUM CHLORIDE 0.9% FLUSH
3.0000 mL | Freq: Two times a day (BID) | INTRAVENOUS | Status: DC
  Administered 2016-11-17 (×2): 3 mL via INTRAVENOUS

## 2016-11-17 MED ORDER — ONDANSETRON HCL 4 MG/2ML IJ SOLN: 4.0000 mg | Freq: Four times a day (QID) | INTRAMUSCULAR | Status: DC | PRN

## 2016-11-17 MED ORDER — INFLUENZA VAC SPLIT QUAD 0.5 ML IM SUSY: 0.5000 mL | PREFILLED_SYRINGE | INTRAMUSCULAR | Status: DC

## 2016-11-17 MED ORDER — PNEUMOCOCCAL VAC POLYVALENT 25 MCG/0.5ML IJ INJ: 0.5000 mL | INJECTION | INTRAMUSCULAR | Status: DC

## 2016-11-17 MED ORDER — SODIUM CHLORIDE 0.9 % IV SOLN
Freq: Once | INTRAVENOUS | Status: AC
Start: 2016-11-17 — End: 2016-11-17
  Administered 2016-11-17: 14:00:00 via INTRAVENOUS

## 2016-11-17 MED ORDER — MIDAZOLAM HCL 10 MG/2ML IJ SOLN
INTRAMUSCULAR | Status: DC | PRN
  Administered 2016-11-17: 2 mg via INTRAVENOUS
  Administered 2016-11-17: 1 mg via INTRAVENOUS
  Administered 2016-11-17 (×2): 2 mg via INTRAVENOUS

## 2016-11-17 MED ORDER — METOCLOPRAMIDE HCL 5 MG/ML IJ SOLN
10.0000 mg | Freq: Once | INTRAMUSCULAR | Status: AC
  Administered 2016-11-17: 10 mg via INTRAVENOUS
  Filled 2016-11-17: qty 2

## 2016-11-17 MED ORDER — ACETAMINOPHEN 650 MG RE SUPP: 650.0000 mg | Freq: Four times a day (QID) | RECTAL | Status: DC | PRN

## 2016-11-17 MED ORDER — ACETAMINOPHEN 325 MG PO TABS: 650.0000 mg | ORAL_TABLET | Freq: Four times a day (QID) | ORAL | Status: DC | PRN

## 2016-11-17 MED ORDER — SODIUM CHLORIDE 0.9 % IV SOLN
INTRAVENOUS | Status: DC
  Administered 2016-11-18: 02:00:00 via INTRAVENOUS

## 2016-11-17 MED ORDER — SODIUM CHLORIDE 0.9 % IV SOLN: 10.0000 mL/h | Freq: Once | INTRAVENOUS | Status: AC

## 2016-11-17 MED ORDER — DIPHENHYDRAMINE HCL 50 MG/ML IJ SOLN
INTRAMUSCULAR | Status: DC | PRN
  Administered 2016-11-17: 25 mg via INTRAVENOUS

## 2016-11-17 MED ORDER — FENTANYL CITRATE (PF) 100 MCG/2ML IJ SOLN
INTRAMUSCULAR | Status: DC | PRN
  Administered 2016-11-17 (×4): 25 ug via INTRAVENOUS

## 2016-11-17 MED ORDER — SODIUM CHLORIDE 0.9 % IV SOLN
8.0000 mg/h | INTRAVENOUS | Status: DC
  Administered 2016-11-17 – 2016-11-19 (×5): 8 mg/h via INTRAVENOUS
  Filled 2016-11-17 (×12): qty 80

## 2016-11-17 MED ORDER — FENTANYL CITRATE (PF) 100 MCG/2ML IJ SOLN
INTRAMUSCULAR | Status: AC
  Filled 2016-11-17: qty 2

## 2016-11-17 NOTE — Interval H&P Note (Signed)
History and Physical Interval Note:  11/17/2016 3:16 PM  Gabriel Marlowe ShoresJason Garcia  has presented today for surgery, with the diagnosis of bleeding  The various methods of treatment have been discussed with the patient and family. After consideration of risks, benefits and other options for treatment, the patient has consented to  Procedure(s): ESOPHAGOGASTRODUODENOSCOPY (EGD) (N/A) as a surgical intervention .  The patient's history has been reviewed, patient examined, no change in status, stable for surgery.  I have reviewed the patient's chart and labs.  Questions were answered to the patient's satisfaction.     Audianna Landgren C.

## 2016-11-17 NOTE — H&P (View-Only) (Signed)
Referring Provider: Dr. Denton Lank Primary Care Physician:  Tana Conch, MD Primary Gastroenterologist:  Dr. Dulce Sellar  Reason for Consultation:  Hematemesis  HPI: Deandra Gadson is a 41 y.o. male with acute onset of vomiting blood last night and this morning and an episode of melena in the ER. Denies abdominal pain. Dizziness at home and almost passed out at home. History of Hep C that was treated last year and seen by Dr. Dulce Sellar in March 2017. History of duodenal ulcer on EGD in 2015 (Dr. Leone Payor). Occasional NSAIDs. History of alcohol abuse and reports quitting alcohol 2 years ago.  Hgb 14. Hypotensive in the 80's systolic on presentation and responded to IVFs. Approximately 400 cc of dark red blood in vomitus in bag in ER.  Past Medical History:  Diagnosis Date  . Acute duodenal ulcer with hemorrhage 10/12/2014  . GERD (gastroesophageal reflux disease)   . GI bleed   . Hepatitis C   . Hypertension   . 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 Legally blind in right eye, as defined in [place]   . Mesenteric adenitis   . Sliding hiatal hernia   . Substance abuse    cocaine  . Terminal ileitis [facility])     Past Surgical History:  Procedure Laterality Date  . ESOPHAGOGASTRODUODENOSCOPY N/A 10/13/2014   Procedure: ESOPHAGOGASTRODUODENOSCOPY (EGD);  Surgeon: Iva Boop, MD;  Location: [facility];  Service: Endoscopy;  Laterality: N/A;  . EYE SURGERY Right   . FOOT SURGERY Right   . LIVER BIOPSY     Dr. Kinnie Scales GI    Prior to Admission medications   Medication Sig Start Date End Date Taking? Authorizing Provider  indomethacin (INDOCIN) 25 MG capsule Take 1 capsule (25 mg total) by mouth 3 (three) times daily as needed. 10/11/16   Elpidio Anis, PA-C  lisinopril-hydrochlorothiazide (PRINZIDE,ZESTORETIC) 20-12.5 MG tablet Take 1 tablet by mouth daily. 09/12/16   [name], MD  valACYclovir (VALTREX) 500 MG tablet Take 1 tablet (500 mg total) by mouth 2 (two)  times daily. For 3 days with outbreaks 09/12/16   [name], MD    Scheduled Meds: . sodium chloride flush  3 mL Intravenous Q12H   Continuous Infusions: . sodium chloride    . sodium chloride 75 mL/hr at 11/17/16 1422  . pantoprozole (PROTONIX) infusion 8 mg/hr (11/17/16 1334)   PRN Meds:.acetaminophen **OR** acetaminophen, ondansetron **OR** ondansetron (ZOFRAN) IV  Allergies as of 11/17/2016  . (No Known Allergies)    Family History  Problem Relation Age of Onset  . Rheum arthritis Mother   . Hypertension Mother   . Alcoholism Mother   . Heart failure Father     rheumatic heart disease, also drinking and smoking  . Alcoholism Father     died age 71  . Colon cancer Neg Hx     Social History   Social History  . Marital status: Single    Spouse name: N/A  . Number of children: 1  . Years of education: N/A   Occupational History  . dye cutter helper [employer]   Social History Main Topics  . Smoking status: Never Smoker  . Smokeless tobacco: Never Used  . Alcohol use No     Comment: etoh free since 08/2015.  . Drug use: No     Comment: used cocaine before but has been clean since 08/2015.   [name] Sexual activity: No   Other Topics Concern  . Not on  file   Social History Narrative   Single.  Lives with his mother.  1 daughter 677 years old in June 2017.       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 Going to concerts    Review of Systems: All negative except as stated above in HPI.  Physical Exam: Vital signs: Vitals:   11/17/16 1437 11/17/16 1445  BP: 116/84 120/93  Pulse: (!) 124 (!) 129  Resp: 23 20  Temp: 98.5 F (36.9 C)      General:   Alert,  Well-developed, well-nourished, pleasant and cooperative in NAD HEENT: anicteric sclera, oropharynx clear Neck: supple, nontender Lungs:  Clear throughout to auscultation.   No wheezes, crackles, or rhonchi. No acute distress. Heart:  Regular rate and  rhythm; no murmurs, clicks, rubs,  or gallops. Abdomen: soft, nontender, nondistended, +BS  Rectal:  Deferred Ext: no edema  GI:  Lab Results:  Recent Labs  11/17/16 1258  WBC 19.3*  HGB 14.0  HCT 42.4  PLT 331   BMET  Recent Labs  11/17/16 1258  NA 137  K 4.6  CL 102  CO2 20*  GLUCOSE 166*  BUN 35*  CREATININE 2.01*  CALCIUM 10.6*   LFT  Recent Labs  11/17/16 1258  PROT 7.3  ALBUMIN 4.1  AST 24  ALT 24  ALKPHOS 50  BILITOT 1.2   PT/INR  Recent Labs  11/17/16 1258  LABPROT 14.2  INR 1.10     Studies/Results: No results found.  Impression/Plan: 41 yo with hematemesis and melena concerning for a peptic ulcer bleed. EGD at bedside now. Protonix drip. NPO. Supportive care.    LOS: 0 days   Olive Motyka C.  11/17/2016, 3:06 PM  Pager [phone]  If no answer or after 5 PM call [phone]

## 2016-11-17 NOTE — H&P (Signed)
History and Physical    Gabriel Garcia ZOX:[id number] DOB: [birth year] DOA: 11/17/2016  PCP: Tana Conch, MD Patient coming from: home  Chief Complaint: hematemesis  HPI: Gabriel Garcia is a 41 y.o. male with medical history significant for duodenal ulcer/GI bleed, EtOH abuse, substance abuse hypertension presents to the emergency Department chief complaint of hematemesis. While in the emergency department patient had 2 episodes of vomiting blood, sent tachycardia hyportensive  Information is obtained from the patient. States he was in his usual state of health until yesterday evening he developed some intermittent epigastric pain. He states he thought it was "heartburn" he took some Tums. Today he had 2 episodes of hematemesis. He states initially he felt nauseated and vomited "a big oh junk of something" and then the next episode was pure blood. He denies any other abdominal pain. Denies headache visual disturbances but does endorse some shortness of breath with exertion and dizziness with position change. He denies chest pain palpitation diaphoresis lower extremity edema. He denies fever chills cough. He denies hemoptysis. He denied bright red blood per rectum or melena. [place] thereafter he had 2 episodes large amount Anita Mcadory tarry stool. He reports he takes 1 or 2 nsaid tablets daily. 4 days a week. He denies any EtOH use. He denies anticoagulation use. He states 2 years ago he had similar symptoms told he had an ulcer needed to stop drinking. He denies dysuria hematuria frequency or urgency.  ED Course: In the emergency department he is afebrile hypotensive and tachycardic. He is provided with 2 L normal saline bolus and vigorous IV fluids. He is also provided with a protonic small Ascent placed on a Protonix drip. At the time of admission blood pressure improved to systolic 120 but heart rate remained in the 130s. 2 units of packed red blood cells of been ordered and first unit being  hung at the time of admission.  Review of Systems: As per HPI otherwise 10 point review of systems negative.   Ambulatory Status: Lites independently no recent falls  Past Medical History:  Diagnosis Date  . Acute duodenal ulcer with hemorrhage 10/12/2014  . GERD (gastroesophageal reflux disease)   . GI bleed   . Hepatitis C   . Hypertension   . 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 Legally blind in right eye, as defined in [place]   . Mesenteric adenitis   . Sliding hiatal hernia   . Substance abuse    cocaine  . Terminal ileitis [facility])     Past Surgical History:  Procedure Laterality Date  . ESOPHAGOGASTRODUODENOSCOPY N/A 10/13/2014   Procedure: ESOPHAGOGASTRODUODENOSCOPY (EGD);  Surgeon: Iva Boop, MD;  Location: [facility];  Service: Endoscopy;  Laterality: N/A;  . EYE SURGERY Right   . FOOT SURGERY Right   . LIVER BIOPSY     Dr. Kinnie Scales GI    Social History   Social History  . Marital status: Single    Spouse name: N/A  . Number of children: 1  . Years of education: N/A   Occupational History  . dye cutter helper [employer]   Social History Main Topics  . Smoking status: Never Smoker  . Smokeless tobacco: Never Used  . Alcohol use No     Comment: etoh free since 08/2015.  . Drug use: No     Comment: used cocaine before but has been clean since 08/2015.   [name] Sexual activity: No   Other Topics Concern  .  Not on file   Social History Narrative   Single.  Lives with his mother.  1 daughter 41 years old in June 2017.       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 Going to concerts    No Known Allergies  Family History  Problem Relation Age of Onset  . Rheum arthritis Mother   . Hypertension Mother   . Alcoholism Mother   . Heart failure Father     rheumatic heart disease, also drinking and smoking  . Alcoholism Father     died age 41  . Colon cancer Neg Hx     Prior to Admission  medications   Medication Sig Start Date End Date Taking? Authorizing Provider  indomethacin (INDOCIN) 25 MG capsule Take 1 capsule (25 mg total) by mouth 3 (three) times daily as needed. 10/11/16   Elpidio AnisShari Upstill, PA-C  lisinopril-hydrochlorothiazide (PRINZIDE,ZESTORETIC) 20-12.5 MG tablet Take 1 tablet by mouth daily. 09/12/16   Shelva MajesticStephen O Hunter, MD  valACYclovir (VALTREX) 500 MG tablet Take 1 tablet (500 mg total) by mouth 2 (two) times daily. For 3 days with outbreaks 09/12/16   Shelva MajesticStephen O Hunter, MD    Physical Exam: Vitals:   11/17/16 1300 11/17/16 1315 11/17/16 1330 11/17/16 1419  BP: 107/72 121/90 120/76 124/89  Pulse: (!) 132 (!) 137 (!) 133 (!) 132  Resp: (!) 31 (!) 29 17 (!) 27  Temp:    98.3 F (36.8 C)  TempSrc:    Oral  SpO2: 96% 95% 98% 100%  Weight:      Height:         General:  Appears Acutely ill but not uncomfortable  Eyes:  PERRL, EOMI, normal lids, iris ENT:  grossly normal hearing, lips & tongue, his membranes of his mouth are pink and dry Neck:  no LAD, masses or thyromegaly Cardiovascular:  Tachycardic but regular, no m/r/g. No LE edema.  Respiratory:  CTA bilaterally, no w/r/r. Normal respiratory effort. Abdomen:  soft, distended positive bowel sounds throughout mild tenderness particularly in the epigastric area Skin:  no rash or induration seen on limited exam Musculoskeletal:  grossly normal tone BUE/BLE, good ROM, no bony abnormality Psychiatric:  grossly normal mood and affect, speech fluent and appropriate, AOx3 Neurologic:  CN 2-12 grossly intact, moves all extremities in coordinated fashion, sensation intact  Labs on Admission: I have personally reviewed following labs and imaging studies  CBC:  Recent Labs Lab 11/17/16 1258  WBC 19.3*  HGB 14.0  HCT 42.4  MCV 93.8  PLT 331   Basic Metabolic Panel:  Recent Labs Lab 11/17/16 1258  NA 137  K 4.6  CL 102  CO2 20*  GLUCOSE 166*  BUN 35*  CREATININE 2.01*  CALCIUM 10.6*    GFR: Estimated Creatinine Clearance: 59.1 mL/min (by C-G formula based on SCr of 2.01 mg/dL (H)). Liver Function Tests:  Recent Labs Lab 11/17/16 1258  AST 24  ALT 24  ALKPHOS 50  BILITOT 1.2  PROT 7.3  ALBUMIN 4.1   No results for input(s): LIPASE, AMYLASE in the last 168 hours. No results for input(s): AMMONIA in the last 168 hours. Coagulation Profile:  Recent Labs Lab 11/17/16 1258  INR 1.10   Cardiac Enzymes: No results for input(s): CKTOTAL, CKMB, CKMBINDEX, TROPONINI in the last 168 hours. BNP (last 3 results) No results for input(s): PROBNP in the last 8760 hours. HbA1C: No results for input(s): HGBA1C in the  last 72 hours. CBG: No results for input(s): GLUCAP in the last 168 hours. Lipid Profile: No results for input(s): CHOL, HDL, LDLCALC, TRIG, CHOLHDL, LDLDIRECT in the last 72 hours. Thyroid Function Tests: No results for input(s): TSH, T4TOTAL, FREET4, T3FREE, THYROIDAB in the last 72 hours. Anemia Panel: No results for input(s): VITAMINB12, FOLATE, FERRITIN, TIBC, IRON, RETICCTPCT in the last 72 hours. Urine analysis:    Component Value Date/Time   COLORURINE AMBER (A) 04/22/2016 0513   APPEARANCEUR CLOUDY (A) 04/22/2016 0513   LABSPEC 1.025 04/22/2016 0513   PHURINE 5.0 04/22/2016 0513   GLUCOSEU NEGATIVE 04/22/2016 0513   HGBUR NEGATIVE 04/22/2016 0513   BILIRUBINUR MODERATE (A) 04/22/2016 0513   BILIRUBINUR n 11/16/2015 1411   KETONESUR NEGATIVE 04/22/2016 0513   PROTEINUR 30 (A) 04/22/2016 0513   UROBILINOGEN 1.0 11/16/2015 1411   UROBILINOGEN 0.2 07/06/2014 1630   NITRITE NEGATIVE 04/22/2016 0513   LEUKOCYTESUR SMALL (A) 04/22/2016 0513    Creatinine Clearance: Estimated Creatinine Clearance: 59.1 mL/min (by C-G formula based on SCr of 2.01 mg/dL (H)).  Sepsis Labs: @LABRCNTIP (procalcitonin:4,lacticidven:4) )No results found for this or any previous visit (from the past 240 hour(s)).   Radiological Exams on Admission: No results  found.  EKG: pending on admission  Assessment/Plan Principal Problem:   Hematemesis Active Problems:   Hepatitis C   Esophageal reflux   Hypertension   Alcoholism (HCC)   AKI (acute kidney injury) (HCC)   Crack cocaine use   OSA (obstructive sleep apnea)   Tachycardia   Hypotension   Melena   Duodenal ulcer   #1.hematemesis/melena. Patient with a history duodenal ulcer and continues  NSAID use and  Substance abuse. She reports 2 episodes prior to arrival and he's had one episode of proximally 400 mL while in the emergency department. Hemoglobin 14.0 protonix initiated in the emergency department -Minute to step down -Continue Protonix -Nothing by mouth -Transfuse 2 units of packed red blood cells -Serial CBCs -Vigorous IV fluids -Gastroenterology consult requested by ED provider. Dr. Bosie Clos indicated may take patient for scope today per ED M.D.  #2. Hypotension. Related to volume loss secondary to #1. Patient's home medications include lisinopril. Systolic blood pressure stable on admission after fluid resuscitation. -Monitor on step down -Hold home meds -IV fluids -Transfusion as noted above -Monitor closely  #3. Tachycardia. Patient denies any chest pain. Likely related to volume loss secondary to #1. -Continue IV fluids -Transfusion as noted above -Obtain an EKG -Monitor closely  #4. Acute kidney injury. Related to above in setting of ACE inhibitor. Creatinine 2.01 on admission. baseline appears to be within the limits of normal -IV fluids as noted above -Hold nephrotoxins -Monitor urine output -Recheck in the morning  #5. History of duodenal ulcer. 2015 recent hospitalized with acute duodenal ulcer with hemorrhage. EGD at that time revealed 2 small clean-based duodenal bulb ulcers and an esophageal diverticulum. -see #1 -admits to continued nsaid use  #6. Hepatitis C. Appears stable. Patient reports 'in remission".    DVT prophylaxis: scd  Code Status:  full  Family Communication: mother and uncle at bedside  Disposition Plan: home  Consults called: schooler  Admission status: inpatient    Gwenyth Bender MD [facility]  If 7PM-7AM, please contact night-coverage [email or web address] Password [email or web address]  11/17/2016, 2:31 PM

## 2016-11-17 NOTE — Brief Op Note (Signed)
Large Mallory Weiss-Tear with active bleeding and an adherent clot present. Hemostasis achieved with Bicap gold probe cautery and epinephrine:saline injection. See endopro note for details. Keep NPO today. Continue Protonix drip. Dr[facility] D/W mother following procedure.

## 2016-11-17 NOTE — ED Triage Notes (Signed)
Pt started vomiting blood 9am. Hx of this in the past and received blood transfusions. Pt has hx of being alcoholic. Denies any alcohol use at this time. EMS reports initial BP 80's systolic. After fluid- BP came [id number]. HR 170s initially for EMS. Pt alert and oriented.

## 2016-11-17 NOTE — Consult Note (Signed)
Referring Provider: Dr. Denton Lank Primary Care Physician:  Tana Conch, MD Primary Gastroenterologist:  Dr. Dulce Sellar  Reason for Consultation:  Hematemesis  HPI: Gabriel Garcia is a 41 y.o. male with acute onset of vomiting blood last night and this morning and an episode of melena in the ER. Denies abdominal pain. Dizziness at home and almost passed out at home. History of Hep C that was treated last year and seen by Dr. Dulce Sellar in March 2017. History of duodenal ulcer on EGD in 2015 (Dr. Leone Payor). Occasional NSAIDs. History of alcohol abuse and reports quitting alcohol 2 years ago.  Hgb 14. Hypotensive in the 80's systolic on presentation and responded to IVFs. Approximately 400 cc of dark red blood in vomitus in bag in ER.  Past Medical History:  Diagnosis Date  . Acute duodenal ulcer with hemorrhage 10/12/2014  . GERD (gastroesophageal reflux disease)   . GI bleed   . Hepatitis C   . Hypertension   . 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 Legally blind in right eye, as defined in [place]   . Mesenteric adenitis   . Sliding hiatal hernia   . Substance abuse    cocaine  . Terminal ileitis [facility])     Past Surgical History:  Procedure Laterality Date  . ESOPHAGOGASTRODUODENOSCOPY Gabriel Garcia 10/13/2014   Procedure: ESOPHAGOGASTRODUODENOSCOPY (EGD);  Surgeon: Iva Boop, MD;  Location: [facility];  Service: Endoscopy;  Laterality: Gabriel Garcia;  . EYE SURGERY Right   . FOOT SURGERY Right   . LIVER BIOPSY     Dr. Kinnie Scales GI    Prior to Admission medications   Medication Sig Start Date End Date Taking? Authorizing Provider  indomethacin (INDOCIN) 25 MG capsule Take 1 capsule (25 mg total) by mouth 3 (three) times daily as needed. 10/11/16   Elpidio Anis, PA-C  lisinopril-hydrochlorothiazide (PRINZIDE,ZESTORETIC) 20-12.5 MG tablet Take 1 tablet by mouth daily. 09/12/16   [name], MD  valACYclovir (VALTREX) 500 MG tablet Take 1 tablet (500 mg total) by mouth 2 (two)  times daily. For 3 days with outbreaks 09/12/16   [name], MD    Scheduled Meds: . sodium chloride flush  3 mL Intravenous Q12H   Continuous Infusions: . sodium chloride    . sodium chloride 75 mL/hr at 11/17/16 1422  . pantoprozole (PROTONIX) infusion 8 mg/hr (11/17/16 1334)   PRN Meds:.acetaminophen **OR** acetaminophen, ondansetron **OR** ondansetron (ZOFRAN) IV  Allergies as of 11/17/2016  . (No Known Allergies)    Family History  Problem Relation Age of Onset  . Rheum arthritis Mother   . Hypertension Mother   . Alcoholism Mother   . Heart failure Father     rheumatic heart disease, also drinking and smoking  . Alcoholism Father     died age 71  . Colon cancer Neg Hx     Social History   Social History  . Marital status: Single    Spouse name: Gabriel Garcia  . Number of children: 1  . Years of education: Gabriel Garcia   Occupational History  . dye cutter helper [employer]   Social History Main Topics  . Smoking status: Never Smoker  . Smokeless tobacco: Never Used  . Alcohol use No     Comment: etoh free since 08/2015.  . Drug use: No     Comment: used cocaine before but has been clean since 08/2015.   [name] Sexual activity: No   Other Topics Concern  . Not on  file   Social History Narrative   Single.  Lives with his mother.  1 daughter 677 years old in June 2017.       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 Going to concerts    Review of Systems: All negative except as stated above in HPI.  Physical Exam: Vital signs: Vitals:   11/17/16 1437 11/17/16 1445  BP: 116/84 120/93  Pulse: (!) 124 (!) 129  Resp: 23 20  Temp: 98.5 F (36.9 C)      General:   Alert,  Well-developed, well-nourished, pleasant and cooperative in NAD HEENT: anicteric sclera, oropharynx clear Neck: supple, nontender Lungs:  Clear throughout to auscultation.   No wheezes, crackles, or rhonchi. No acute distress. Heart:  Regular rate and  rhythm; no murmurs, clicks, rubs,  or gallops. Abdomen: soft, nontender, nondistended, +BS  Rectal:  Deferred Ext: no edema  GI:  Lab Results:  Recent Labs  11/17/16 1258  WBC 19.3*  HGB 14.0  HCT 42.4  PLT 331   BMET  Recent Labs  11/17/16 1258  NA 137  K 4.6  CL 102  CO2 20*  GLUCOSE 166*  BUN 35*  CREATININE 2.01*  CALCIUM 10.6*   LFT  Recent Labs  11/17/16 1258  PROT 7.3  ALBUMIN 4.1  AST 24  ALT 24  ALKPHOS 50  BILITOT 1.2   PT/INR  Recent Labs  11/17/16 1258  LABPROT 14.2  INR 1.10     Studies/Results: No results found.  Impression/Plan: 41 yo with hematemesis and melena concerning for a peptic ulcer bleed. EGD at bedside now. Protonix drip. NPO. Supportive care.    LOS: 0 days   [name].  11/17/2016, 3:06 PM  Pager [phone]  If no answer or after 5 PM call [phone]

## 2016-11-17 NOTE — ED Provider Notes (Signed)
[facility]-EMERGENCY DEPT Provider Note   CSN: [id number] Arrival date & time: 11/17/16  1241     History   Chief Complaint Chief Complaint  Patient presents with  . Hematemesis    HPI Gabriel Garcia is a 41 y.o. male.  Patient with hx duodenal ulcer/GI bleeding, presents c/o vomiting of blood several times this AM.  States felt ok yesterday. Today feels generally weak, faint when stands, and that he had a few episodes of vomiting bright and dark blood. Denies hemoptysis. No rectal bleeding or melena. Occasional nsaid use. Denies anticoagulant use.  States hx similar symptoms in past, last approx 2 years ago. States he was last seen by Dr Dulce Sellarutlaw of GI, although hasnt seen recently. No other abnormal bruising or bleeding. Hx hep c, but states treated and 'in remission'.      The history is provided by the patient.    Past Medical History:  Diagnosis Date  . Acute duodenal ulcer with hemorrhage 10/12/2014  . GERD (gastroesophageal reflux disease)   . GI bleed   . Hepatitis C   . Hypertension   . 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 Legally blind in right eye, as defined in [place]   . Mesenteric adenitis   . Sliding hiatal hernia   . Substance abuse    cocaine  . Terminal ileitis [facility](HCC)     Patient Active Problem List   Diagnosis Date Noted  . BPPV (benign paroxysmal positional vertigo) 09/12/2016  . Genital herpes 09/12/2016  . OSA (obstructive sleep apnea) 05/28/2016  . Obese 11/17/2015  . Cocaine abuse 11/16/2015  . Alcoholism (HCC) 11/16/2015  . Gout 11/16/2015  . Legally blind in right eye, as defined in [place]   . Acute duodenal ulcer with hemorrhage 10/12/2014  . EKG abnormality 10/12/2014  . Acute blood loss anemia 10/12/2014  . Hypertension 10/06/2014  . Esophageal reflux 11/24/2013  . Hepatitis C     Past Surgical History:  Procedure Laterality Date  . ESOPHAGOGASTRODUODENOSCOPY N/A 10/13/2014   Procedure: ESOPHAGOGASTRODUODENOSCOPY  (EGD);  Surgeon: Iva Booparl E Gessner, MD;  [facility];  Service: Endoscopy;  Laterality: N/A;  . EYE SURGERY Right   . FOOT SURGERY Right   . LIVER BIOPSY     Dr. [facility]es[facility] GI       Home Medications    Prior to Admission medications   Medication Sig Start Date End Date Taking? Authorizing Provider  indomethacin (INDOCIN) 25 MG capsule Take 1 capsule (25 mg total) by mouth 3 (three) times daily as needed. 10/11/16   Elpidio AnisShari Upstill, PA-C  lisinopril-hydrochlorothiazide (PRINZIDE,ZESTORETIC) 20-12.5 MG tablet Take 1 tablet by mouth daily. 09/12/16   Shelva MajesticStephen O Hunter, MD  valACYclovir (VALTREX) 500 MG tablet Take 1 tablet (500 mg total) by mouth 2 (two) times daily. For 3 days with outbreaks 09/12/16   Shelva MajesticStephen O Hunter, MD    Family History Family History  Problem Relation Age of Onset  . Rheum arthritis Mother   . Hypertension Mother   . Alcoholism Mother   . Heart failure Father     rheumatic heart disease, also drinking and smoking  . Alcoholism Father     died age 41  . Colon cancer Neg Hx     Social History Social History  Substance Use Topics  . Smoking status: Never Smoker  . Smokeless tobacco: Never Used  . Alcohol use No     Comment: etoh free since 08/2015.  Allergies   Patient has no known allergies.   Review of Systems Review of Systems  Constitutional: Negative for chills and fever.  HENT: Negative for sore throat.   Eyes: Negative for redness.  Respiratory: Negative for shortness of breath.   Cardiovascular: Negative for chest pain.  Gastrointestinal: Positive for vomiting. Negative for abdominal pain and blood in stool.  Genitourinary: Negative for flank pain.  Musculoskeletal: Negative for back pain and neck pain.  Skin: Negative for rash.  Neurological: Positive for light-headedness. Negative for headaches.  Hematological: Does not bruise/bleed easily.  Psychiatric/Behavioral: Negative for confusion.     Physical Exam Updated Vital  Signs BP 107/72   Pulse (!) 132   Temp 97.9 F (36.6 C) (Oral)   Resp (!) 31   Ht 5\' 10"  (1.778 m)   Wt 104.3 kg   SpO2 96%   BMI 33.00 kg/m   Physical Exam  Constitutional: He appears well-developed and well-nourished.  Vomiting blood. Hypotensive, tachycardic.   HENT:  Mouth/Throat: Oropharynx is clear and moist.  Eyes: Conjunctivae are normal.  Neck: Neck supple. No tracheal deviation present.  Cardiovascular: Normal rate, normal heart sounds and intact distal pulses.  Exam reveals no gallop and no friction rub.   No murmur heard. Pulmonary/Chest: Effort normal and breath sounds normal. No accessory muscle usage. No respiratory distress.  Abdominal: Soft. Bowel sounds are normal. He exhibits no distension and no mass. There is no tenderness. There is no rebound and no guarding. No hernia.  Genitourinary:  Genitourinary Comments: No cva tenderness. Minimal stool on rectal, yellowish in color.   Musculoskeletal: He exhibits no edema.  Neurological: He is alert.  Skin: Skin is warm and dry.  Psychiatric: He has a normal mood and affect.  Nursing note and vitals reviewed.    ED Treatments / Results  Labs (all labs ordered are listed, but only abnormal results are displayed) Results for orders placed or performed during the hospital encounter of 11/17/16  CBC  Result Value Ref Range   WBC 19.3 (H) 4.0 - 10.5 K/uL   RBC 4.52 4.22 - 5.81 MIL/uL   Hemoglobin 14.0 13.0 - 17.0 g/dL   HCT 40.1 02.7 - 25.3 %   MCV 93.8 78.0 - 100.0 fL   MCH 31.0 26.0 - 34.0 pg   MCHC 33.0 30.0 - 36.0 g/dL   RDW 66.4 40.3 - 47.4 %   Platelets 331 150 - 400 K/uL  POC occult blood, ED  Result Value Ref Range   Fecal Occult Bld NEGATIVE NEGATIVE  Type and screen [facility]  Result Value Ref Range   ABO/RH(D) A POS    Antibody Screen PENDING    Sample Expiration 11/20/2016   Prepare RBC  Result Value Ref Range   Order Confirmation ORDER PROCESSED BY BLOOD BANK      EKG  EKG Interpretation None       Radiology No results found.  Procedures Procedures (including critical care time)  Medications Ordered in ED Medications  pantoprazole (PROTONIX) 80 mg in sodium chloride 0.9 % 100 mL IVPB (not administered)  pantoprazole (PROTONIX) 80 mg in sodium chloride 0.9 % 250 mL (0.32 mg/mL) infusion (not administered)  0.9 %  sodium chloride infusion (not administered)  ondansetron (ZOFRAN) injection 4 mg (4 mg Intravenous Given 11/17/16 1310)  sodium chloride 0.9 % bolus 1,000 mL (1,000 mLs Intravenous New Bag/Given 11/17/16 1310)     Initial Impression / Assessment and Plan / ED Course  I have  reviewed the triage vital signs and the nursing notes.  Pertinent labs & imaging results that were available during my care of the patient were reviewed by me and considered in my medical decision making (see chart for details).  Reviewed nursing notes and prior charts for additional history.   Iv ns.   2 large bore ivs.  Iv ns bolus.   protonix 80 mg iv, and gtt.  GI consulted.   Initially Dr Russella Dar called back, he states pt previously had been dismiossed from their practice, and that as pt reports seeing Dr Dulce Sellar since that time, to contact [facility] on call.  [facility] called.  Transfuse 2 units PRBC.  approx 400 cc dark red blood in bag in ed.    Discussed pt with Dr Bosie Clos - he indicates keep npo, they will see and probably scope today, to admit to medical service to stepdown.   CRITICAL CARE  RE: acute upper gi bleeding with hypotension, requiring aggressive fluid resus and transfusion prbc, emergent gi consult.  Performed by: Suzi Roots Total critical care time: 40 minutes Critical care time was exclusive of separately billable procedures and treating other patients. Critical care was necessary to treat or prevent imminent or life-threatening deterioration. Critical care was time spent personally by me on the following activities:  development of treatment plan with patient and/or surrogate as well as nursing, discussions with consultants, evaluation of patient's response to treatment, examination of patient, obtaining history from patient or surrogate, ordering and performing treatments and interventions, ordering and review of laboratory studies, ordering and review of radiographic studies, pulse oximetry and re-evaluation of patient's condition.   Final Clinical Impressions(s) / ED Diagnoses   Final diagnoses:  None    New Prescriptions New Prescriptions   No medications on file     Cathren Laine, MD 11/17/16 1338

## 2016-11-17 NOTE — ED Notes (Signed)
Attempted report x1. 

## 2016-11-17 NOTE — ED Notes (Signed)
Placed bedside commode at pt's bedside. 

## 2016-11-17 NOTE — Op Note (Signed)
[facility] Patient Name: Gabriel LindauBilly Pla Procedure Date : 11/17/2016 MRN: [id number] Attending MD: Shirley FriarVincent C Georgann Bramble , MD Date of Birth: 05/19/1976 CSN: [id number] Age: [age] Admit Type: Outpatient Procedure:                Upper GI endoscopy Indications:              Hematemesis, Melena Providers:                Shirley FriarVincent C. Bird Swetz, MD, Anthony Saraniel Madden, RN, Lorenda IshiharaSam                            Tetteh, Technician Referring MD:              Medicines:                Fentanyl 100 micrograms IV, Midazolam 7 mg IV,                            Diphenhydramine 25 mg IV, Cetacaine spray Complications:            No immediate complications. Estimated Blood Loss:     Estimated blood loss was minimal. Procedure:                Pre-Anesthesia Assessment:                           - Prior to the procedure, a History and Physical                            was performed, and patient medications and                            allergies were reviewed. The patient's tolerance of                            previous anesthesia was also reviewed. The risks                            and benefits of the procedure and the sedation                            options and risks were discussed with the patient.                            All questions were answered, and informed consent                            was obtained. Prior Anticoagulants: The patient has                            taken no previous anticoagulant or antiplatelet                            agents. ASA Grade Assessment: II - A patient with  mild systemic disease. After reviewing the risks                            and benefits, the patient was deemed in                            satisfactory condition to undergo the procedure.                           After obtaining informed consent, the endoscope was                            passed under direct vision. Throughout the   procedure, the patient's blood pressure, pulse, and                            oxygen saturations were monitored continuously. The                            [id number]-[id number] ([id number]) scope was introduced through the                            mouth, and advanced to the second part of duodenum.                            The upper GI endoscopy was performed with                            difficulty due to excessive bleeding, the patient's                            agitation and ineffective sedation. Successful                            completion of the procedure was aided by increasing                            the dose of sedation medication, straightening and                            shortening the scope to obtain bowel loop reduction                            and controlling the bleeding. The patient tolerated                            the procedure. Scope In: Scope Out: Findings:      LA Grade D (one or more mucosal breaks involving at least 75% of       esophageal circumference) esophagitis with bleeding was found in the       entire esophagus.      A medium bleeding Mallory-Weiss tear with stigmata of recent bleeding       was found. Area was successfully injected with 2 mL of a 1:10,000  solution of epinephrine for hemostasis. Estimated blood loss was       minimal. Coagulation for hemostasis using bipolar probe was successful.       Estimated blood loss: none.      The Z-line was found 36 cm from the incisors.      Hematin (altered blood/coffee-ground-like material) was found in the       entire examined stomach.      A small hiatal hernia was present.      Segmental moderate inflammation characterized by congestion (edema) and       erosions was found in the prepyloric region of the stomach.      A few localized erosions without bleeding were found in the duodenal       bulb.      The second portion of the duodenum was normal. Impression:               - LA Grade D  reflux esophagitis.                           - Mallory-Weiss tear. Injected. Treated with                            bipolar cautery.                           - Z-line, 36 cm from the incisors.                           - Hematin (altered blood/coffee-ground-like                            material) in the entire stomach.                           - Small hiatal hernia.                           - Acute gastritis.                           - Duodenal erosions without bleeding.                           - Normal second portion of the duodenum.                           - No specimens collected. Moderate Sedation:      Moderate (conscious) sedation was administered by the endoscopy nurse       and supervised by the endoscopist. The following parameters were       monitored: oxygen saturation, heart rate, blood pressure, and response       to care. Recommendation:           - Give Protonix (pantoprazole): 8 mg/hr IV by                            continuous infusion.                           -  NPO.                           - Post procedure medication orders were given. Procedure Code(s):        --- Professional ---                           [phone], Esophagogastroduodenoscopy, flexible,                            transoral; with control of bleeding, any method Diagnosis Code(s):        --- Professional ---                           K92.0, Hematemesis                           K92.1, Melena (includes Hematochezia)                           K22.6, Gastro-esophageal laceration-hemorrhage                            syndrome                           K26.9, Duodenal ulcer, unspecified as acute or                            chronic, without hemorrhage or perforation                           K92.2, Gastrointestinal hemorrhage, unspecified                           K21.0, Gastro-esophageal reflux disease with                            esophagitis                           K29.00, Acute  gastritis without bleeding                           K44.9, Diaphragmatic hernia without obstruction or                            gangrene CPT copyright 2016 [employer]. All rights reserved. The codes documented in this report are preliminary and upon coder review may  be revised to meet current compliance requirements. Shirley Friar, MD 11/17/2016 4:18:10 PM This report has been signed electronically. Number of Addenda: 0

## 2016-11-18 DIAGNOSIS — K269 Duodenal ulcer, unspecified as acute or chronic, without hemorrhage or perforation: Secondary | ICD-10-CM

## 2016-11-18 DIAGNOSIS — G4733 Obstructive sleep apnea (adult) (pediatric): Secondary | ICD-10-CM

## 2016-11-18 DIAGNOSIS — F1490 Cocaine use, unspecified, uncomplicated: Secondary | ICD-10-CM

## 2016-11-18 DIAGNOSIS — N179 Acute kidney failure, unspecified: Secondary | ICD-10-CM

## 2016-11-18 DIAGNOSIS — K921 Melena: Secondary | ICD-10-CM

## 2016-11-18 LAB — CBC
HCT: 39.8 % (ref 39.0–52.0)
HEMATOCRIT: 40.1 % (ref 39.0–52.0)
HEMOGLOBIN: 12.9 g/dL — AB (ref 13.0–17.0)
Hemoglobin: 12.6 g/dL — ABNORMAL LOW (ref 13.0–17.0)
MCH: 30.2 pg (ref 26.0–34.0)
MCH: 30.1 pg (ref 26.0–34.0)
MCHC: 31.7 g/dL (ref 30.0–36.0)
MCHC: 32.2 g/dL (ref 30.0–36.0)
MCV: 93.7 fL (ref 78.0–100.0)
MCV: 95.4 fL (ref 78.0–100.0)
PLATELETS: 227 10*3/uL (ref 150–400)
Platelets: 240 10*3/uL (ref 150–400)
RBC: 4.28 MIL/uL (ref 4.22–5.81)
RBC: 4.17 MIL/uL — ABNORMAL LOW (ref 4.22–5.81)
RDW: 15.6 % — AB (ref 11.5–15.5)
RDW: 15.1 % (ref 11.5–15.5)
WBC: 16.9 10*3/uL — ABNORMAL HIGH (ref 4.0–10.5)
WBC: 12.4 10*3/uL — ABNORMAL HIGH (ref 4.0–10.5)

## 2016-11-18 LAB — RETICULOCYTES
RBC.: 4.25 MIL/uL (ref 4.22–5.81)
RETIC COUNT ABSOLUTE: 63.8 10*3/uL (ref 19.0–186.0)
RETIC CT PCT: 1.5 % (ref 0.4–3.1)

## 2016-11-18 LAB — IRON AND TIBC
Iron: 33 ug/dL — ABNORMAL LOW (ref 45–182)
SATURATION RATIOS: 13 % — AB (ref 17.9–39.5)
TIBC: 251 ug/dL (ref 250–450)
UIBC: 218 ug/dL

## 2016-11-18 LAB — BASIC METABOLIC PANEL
Anion gap: 6 (ref 5–15)
BUN: 27 mg/dL — AB (ref 6–20)
CALCIUM: 9.1 mg/dL (ref 8.9–10.3)
CO2: 24 mmol/L (ref 22–32)
CREATININE: 1.28 mg/dL — AB (ref 0.61–1.24)
Chloride: 111 mmol/L (ref 101–111)
GFR calc non Af Amer: >60 mL/min (ref >60)
Glucose, Bld: 97 mg/dL (ref 65–99)
Potassium: 4.1 mmol/L (ref 3.5–5.1)
Sodium: 141 mmol/L (ref 135–145)

## 2016-11-18 LAB — FERRITIN: FERRITIN: 90 ng/mL (ref 24–336)

## 2016-11-18 LAB — FOLATE: Folate: 44.7 ng/mL (ref >5.9)

## 2016-11-18 LAB — VITAMIN B12: Vitamin B-12: 476 pg/mL (ref 180–914)

## 2016-11-18 MED ORDER — CHLORDIAZEPOXIDE HCL 5 MG PO CAPS
10.0000 mg | ORAL_CAPSULE | Freq: Three times a day (TID) | ORAL | Status: DC
  Administered 2016-11-18: 10 mg via ORAL
  Filled 2016-11-18: qty 2

## 2016-11-18 MED ORDER — ZOLPIDEM TARTRATE 5 MG PO TABS
5.0000 mg | ORAL_TABLET | Freq: Every evening | ORAL | Status: DC | PRN
  Administered 2016-11-18 – 2016-11-19 (×2): 5 mg via ORAL
  Filled 2016-11-18 (×2): qty 1

## 2016-11-18 MED ORDER — METOPROLOL TARTRATE 50 MG PO TABS: 50.0000 mg | ORAL_TABLET | Freq: Two times a day (BID) | ORAL | Status: DC

## 2016-11-18 MED ORDER — LORAZEPAM 1 MG PO TABS: 2.0000 mg | ORAL_TABLET | ORAL | Status: DC | PRN

## 2016-11-18 MED ORDER — METOPROLOL TARTRATE 5 MG/5ML IV SOLN: 5.0000 mg | INTRAVENOUS | Status: DC | PRN

## 2016-11-18 MED ORDER — THIAMINE HCL 100 MG/ML IJ SOLN: 100.0000 mg | Freq: Every day | INTRAMUSCULAR | Status: DC

## 2016-11-18 MED ORDER — SODIUM CHLORIDE 0.9 % IV SOLN
INTRAVENOUS | Status: DC
  Administered 2016-11-18 (×2): via INTRAVENOUS

## 2016-11-18 MED ORDER — VITAMIN B-1 100 MG PO TABS
100.0000 mg | ORAL_TABLET | Freq: Every day | ORAL | Status: DC
  Administered 2016-11-18 – 2016-11-19 (×2): 100 mg via ORAL
  Filled 2016-11-18 (×3): qty 1

## 2016-11-18 MED ORDER — ADULT MULTIVITAMIN W/MINERALS CH
1.0000 | ORAL_TABLET | Freq: Every day | ORAL | Status: DC
  Administered 2016-11-18 – 2016-11-19 (×2): 1 via ORAL
  Filled 2016-11-18 (×3): qty 1

## 2016-11-18 MED ORDER — PNEUMOCOCCAL VAC POLYVALENT 25 MCG/0.5ML IJ INJ: 0.5000 mL | INJECTION | INTRAMUSCULAR | Status: AC | PRN

## 2016-11-18 MED ORDER — FOLIC ACID 1 MG PO TABS
1.0000 mg | ORAL_TABLET | Freq: Every day | ORAL | Status: DC
  Administered 2016-11-18 – 2016-11-19 (×2): 1 mg via ORAL
  Filled 2016-11-18 (×3): qty 1

## 2016-11-18 MED ORDER — DILTIAZEM HCL 60 MG PO TABS
90.0000 mg | ORAL_TABLET | Freq: Three times a day (TID) | ORAL | Status: DC
  Administered 2016-11-18 (×3): 90 mg via ORAL
  Filled 2016-11-18 (×3): qty 1

## 2016-11-18 MED ORDER — INFLUENZA VAC SPLIT QUAD 0.5 ML IM SUSY: 0.5000 mL | PREFILLED_SYRINGE | INTRAMUSCULAR | Status: AC | PRN

## 2016-11-18 NOTE — Progress Notes (Addendum)
PROGRESS NOTE                                                                                                                                                                                                             Patient Demographics:    Gabriel Garcia, is a 41 y.o. male, DOB - [birth year], ZOX:[id number]  Admit date - 11/17/2016   Admitting Physician Haydee Salter, MD  Outpatient Primary MD for the patient is Tana Conch, MD  LOS - 1  Chief Complaint  Patient presents with  . Hematemesis       Brief Narrative  Allah Reason is a 41 y.o. male with medical history significant for duodenal ulcer/GI bleed, EtOH abuse says he has quit several months ago, substance abuse hypertension presents to the emergency Department chief complaint of hematemesis. Was admitted to the hospital for upper GI bleed caused by Melony Overly tear   Subjective:    Jenean Lindau today has, No headache, No chest pain, No abdominal pain - No Nausea, No new weakness tingling or numbness, No Cough - SOB.     Assessment  & Plan :     1.Mallory-Weiss tear, gastritis, esophagitis. Upper GI bleed with acute blood loss plated anemia. EGD done showing esophagitis, gastritis, Mallory-Weiss tear and some blood in the stomach, Mallory-Weiss tear was cauterized, we are monitoring H&H currently not requiring transfusion, he is on IV PPI drip which will be continued, discussed with GI this morning continue clear liquid diet and monitor for any bleeding reoccurrence.  2. History of cocaine abuse. He denies using alcohol and says he has quit several months ago and has had no drinks. Counseled to quit all, supportive care. We'll place on folic acid and thiamine. Should not go in DTs if he has not had alcohol in several months.  3. Hypertension with mild tachycardia. Placed on Cardizem for now orally, will avoid beta blocker due to ongoing cocaine  abuse.  4. History of hep C. Per patient he is in remission, No acute issue outpatient GI and ID follow-up.  5. ARF due to dehydration. Resolved with IV fluids.  6. OSA. Continue CPAP daily at bedtime.   Diet : Diet clear liquid Room service appropriate? Yes; Fluid consistency: Thin    Family Communication  :  None  Code Status :  Full  Disposition Plan  :  Home in am  Consults  :  GI  Procedures  :    EGD showing esophagitis, gastritis, Mallory-Weiss tear and old blood in the stomach.  DVT Prophylaxis  :   SCDs    Lab Results  Component Value Date   PLT 227 11/18/2016    Inpatient Medications  Scheduled Meds: . diltiazem  90 mg Oral Q8H  . folic acid  1 mg Oral Daily  . Influenza vac split quadrivalent PF  0.5 mL Intramuscular Tomorrow-1000  . multivitamin with minerals  1 tablet Oral Daily  . pneumococcal 23 valent vaccine  0.5 mL Intramuscular Tomorrow-1000  . thiamine  100 mg Oral Daily   Continuous Infusions: . sodium chloride 100 mL/hr at 11/18/16 0702  . pantoprozole (PROTONIX) infusion 8 mg/hr (11/18/16 0401)   PRN Meds:.[DISCONTINUED] ondansetron **OR** ondansetron (ZOFRAN) IV  Antibiotics  :    Anti-infectives    None         Objective:   Vitals:   11/18/16 0530 11/18/16 0600 11/18/16 0814 11/18/16 0924  BP: 112/75 116/83 134/86 126/79  Pulse: (!) 104 (!) 115 (!) 102   Resp: (!) 22 (!) 24 17   Temp:   98.1 F (36.7 C)   TempSrc:   Oral   SpO2: (!) 88% 95% 96%   Weight:      Height:        Wt Readings from Last 3 Encounters:  11/18/16 105 kg (231 lb 7.7 oz)  10/28/16 109.7 kg (241 lb 12.8 oz)  10/11/16 104.3 kg (230 lb)     Intake/Output Summary (Last 24 hours) at 11/18/16 1035 Last data filed at 11/18/16 [id number]  Gross per 24 hour  Intake          1374.67 ml  Output             1075 ml  Net           299.67 ml     Physical Exam  Awake Alert, Oriented X 3, No new F.N deficits, Normal affect [place].AT,PERRAL Supple Neck,No JVD,  No cervical lymphadenopathy appriciated.  Symmetrical Chest wall movement, Good air movement bilaterally, CTAB RRR,No Gallops,Rubs or new Murmurs, No Parasternal Heave +ve B.Sounds, Abd Soft, No tenderness, No organomegaly appriciated, No rebound - guarding or rigidity. No Cyanosis, Clubbing or edema, No new Rash or bruise     Data Review:    CBC  Recent Labs Lab 11/17/16 1258 11/17/16 2212 11/18/16 0050 11/18/16 0907  WBC 19.3* 20.1* 16.9* 12.4*  HGB 14.0 12.9* 12.9* 12.6*  HCT 42.4 39.5 40.1 39.8  PLT 331 247 240 227  MCV 93.8 93.8 93.7 95.4  MCH 31.0 30.6 30.1 30.2  MCHC 33.0 32.7 32.2 31.7  RDW 14.2 14.9 15.1 15.6*    Chemistries   Recent Labs Lab 11/17/16 1258 11/18/16 0356  NA 137 141  K 4.6 4.1  CL 102 111  CO2 20* 24  GLUCOSE 166* 97  BUN 35* 27*  CREATININE 2.01* 1.28*  CALCIUM 10.6* 9.1  AST 24  --   ALT 24  --   ALKPHOS 50  --   BILITOT 1.2  --    ------------------------------------------------------------------------------------------------------------------ No results for input(s): CHOL, HDL, LDLCALC, TRIG, CHOLHDL, LDLDIRECT in the last 72 hours.  Lab Results  Component Value Date   HGBA1C 5.3 10/04/2016   ------------------------------------------------------------------------------------------------------------------ No results for input(s): TSH, T4TOTAL, T3FREE, THYROIDAB in the last 72 hours.  Invalid input(s): FREET3 ------------------------------------------------------------------------------------------------------------------  Recent  Labs  11/18/16 0907  RETICCTPCT 1.5    Coagulation profile  Recent Labs Lab 11/17/16 1258  INR 1.10    No results for input(s): DDIMER in the last 72 hours.  Cardiac Enzymes No results for input(s): CKMB, TROPONINI, MYOGLOBIN in the last 168 hours.  Invalid input(s):  CK ------------------------------------------------------------------------------------------------------------------ No results found for: BNP  Micro Results Recent Results (from the past 240 hour(s))  MRSA PCR Screening     Status: None   Collection Time: 11/17/16  6:00 PM  Result Value Ref Range Status   MRSA by PCR NEGATIVE NEGATIVE Final    Comment:        The GeneXpert MRSA Assay (FDA approved for NASAL specimens only), is one component of a comprehensive MRSA colonization surveillance program. It is not intended to diagnose MRSA infection nor to guide or monitor treatment for MRSA infections.     Radiology Reports No results found.  Time Spent in minutes  30   Daysie Helf K M.D on 11/18/2016 at 10:35 AM  Between 7am to 7pm - Pager - [phone]  After 7pm go to [email or web address] - password [email or web address] [address]  [facility] -  Office  [phone]

## 2016-11-18 NOTE — Progress Notes (Signed)
[facility] Progress Note  Subjective: No complaints of chest pain or nausea  Objective: Vital signs in last 24 hours: Temp:  [97.9 F (36.6 C)-99.3 F (37.4 C)] 98.1 F (36.7 C) (02/05 0814) Pulse Rate:  [102-137] 102 (02/05 0814) Resp:  [12-31] 17 (02/05 0814) BP: (89-140)/(59-119) 126/79 (02/05 0924) SpO2:  [86 %-100 %] 96 % (02/05 0814) Weight:  [104.3 kg (230 lb)-105 kg (231 lb 7.7 oz)] 105 kg (231 lb 7.7 oz) (02/05 0343) Weight change:    PE: Unchanged  Lab Results: Results for orders placed or performed during the hospital encounter of 11/17/16 (from the past 24 hour(s))  Comprehensive metabolic panel     Status: Abnormal   Collection Time: 11/17/16 12:58 PM  Result Value Ref Range   Sodium 137 135 - 145 mmol/L   Potassium 4.6 3.5 - 5.1 mmol/L   Chloride 102 101 - 111 mmol/L   CO2 20 (L) 22 - 32 mmol/L   Glucose, Bld 166 (H) 65 - 99 mg/dL   BUN 35 (H) 6 - 20 mg/dL   Creatinine, Ser 1.61 (H) 0.61 - 1.24 mg/dL   Calcium 09.6 (H) 8.9 - 10.3 mg/dL   Total Protein 7.3 6.5 - 8.1 g/dL   Albumin 4.1 3.5 - 5.0 g/dL   AST 24 15 - 41 U/L   ALT 24 17 - 63 U/L   Alkaline Phosphatase 50 38 - 126 U/L   Total Bilirubin 1.2 0.3 - 1.2 mg/dL   GFR calc non Af Amer 40 (L) >60 mL/min   GFR calc Af Amer 46 (L) >60 mL/min   Anion gap 15 5 - 15  CBC     Status: Abnormal   Collection Time: 11/17/16 12:58 PM  Result Value Ref Range   WBC 19.3 (H) 4.0 - 10.5 K/uL   RBC 4.52 4.22 - 5.81 MIL/uL   Hemoglobin 14.0 13.0 - 17.0 g/dL   HCT 04.5 40.9 - 81.1 %   MCV 93.8 78.0 - 100.0 fL   MCH 31.0 26.0 - 34.0 pg   MCHC 33.0 30.0 - 36.0 g/dL   RDW 91.4 78.2 - 95.6 %   Platelets 331 150 - 400 K/uL  Type and screen [facility]     Status: None (Preliminary result)   Collection Time: 11/17/16 12:58 PM  Result Value Ref Range   ABO/RH(D) A POS    Antibody Screen NEG    Sample Expiration 11/20/2016    Unit Number [id number]    Blood Component Type RED  CELLS,LR    Unit division 00    Status of Unit ISSUED,FINAL    Transfusion Status OK TO TRANSFUSE    Crossmatch Result Compatible    Unit Number [id number]    Blood Component Type RED CELLS,LR    Unit division 00    Status of Unit ALLOCATED    Transfusion Status OK TO TRANSFUSE    Crossmatch Result Compatible   Protime-INR     Status: None   Collection Time: 11/17/16 12:58 PM  Result Value Ref Range   Prothrombin Time 14.2 11.4 - 15.2 seconds   INR 1.10   APTT     Status: None   Collection Time: 11/17/16 12:58 PM  Result Value Ref Range   aPTT 24 24 - 36 seconds  ABO/Rh     Status: None   Collection Time: 11/17/16 12:58 PM  Result Value Ref Range   ABO/RH(D) A POS   POC occult blood, ED  Status: None   Collection Time: 11/17/16  1:09 PM  Result Value Ref Range   Fecal Occult Bld NEGATIVE NEGATIVE  Prepare RBC     Status: None   Collection Time: 11/17/16  1:09 PM  Result Value Ref Range   Order Confirmation ORDER PROCESSED BY BLOOD BANK   MRSA PCR Screening     Status: None   Collection Time: 11/17/16  6:00 PM  Result Value Ref Range   MRSA by PCR NEGATIVE NEGATIVE  Urinalysis, Routine w reflex microscopic     Status: Abnormal   Collection Time: 11/17/16  8:00 PM  Result Value Ref Range   Color, Urine YELLOW YELLOW   APPearance CLEAR CLEAR   Specific Gravity, Urine 1.021 1.005 - 1.030   pH 5.0 5.0 - 8.0   Glucose, UA NEGATIVE NEGATIVE mg/dL   Hgb urine dipstick NEGATIVE NEGATIVE   Bilirubin Urine NEGATIVE NEGATIVE   Ketones, ur 5 (A) NEGATIVE mg/dL   Protein, ur NEGATIVE NEGATIVE mg/dL   Nitrite NEGATIVE NEGATIVE   Leukocytes, UA NEGATIVE NEGATIVE  Urine rapid drug screen (hosp performed)     Status: Abnormal   Collection Time: 11/17/16  8:00 PM  Result Value Ref Range   Opiates NONE DETECTED NONE DETECTED   Cocaine POSITIVE (A) NONE DETECTED   Benzodiazepines POSITIVE (A) NONE DETECTED   Amphetamines NONE DETECTED NONE DETECTED   Tetrahydrocannabinol  NONE DETECTED NONE DETECTED   Barbiturates NONE DETECTED NONE DETECTED  CBC     Status: Abnormal   Collection Time: 11/17/16 10:12 PM  Result Value Ref Range   WBC 20.1 (H) 4.0 - 10.5 K/uL   RBC 4.21 (L) 4.22 - 5.81 MIL/uL   Hemoglobin 12.9 (L) 13.0 - 17.0 g/dL   HCT 45.439.5 09.839.0 - 11.952.0 %   MCV 93.8 78.0 - 100.0 fL   MCH 30.6 26.0 - 34.0 pg   MCHC 32.7 30.0 - 36.0 g/dL   RDW 14.714.9 82.911.5 - 56.215.5 %   Platelets 247 150 - 400 K/uL  CBC     Status: Abnormal   Collection Time: 11/18/16 12:50 AM  Result Value Ref Range   WBC 16.9 (H) 4.0 - 10.5 K/uL   RBC 4.28 4.22 - 5.81 MIL/uL   Hemoglobin 12.9 (L) 13.0 - 17.0 g/dL   HCT 13.040.1 86.539.0 - 78.452.0 %   MCV 93.7 78.0 - 100.0 fL   MCH 30.1 26.0 - 34.0 pg   MCHC 32.2 30.0 - 36.0 g/dL   RDW 69.615.1 29.511.5 - 28.415.5 %   Platelets 240 150 - 400 K/uL  Basic metabolic panel     Status: Abnormal   Collection Time: 11/18/16  3:56 AM  Result Value Ref Range   Sodium 141 135 - 145 mmol/L   Potassium 4.1 3.5 - 5.1 mmol/L   Chloride 111 101 - 111 mmol/L   CO2 24 22 - 32 mmol/L   Glucose, Bld 97 65 - 99 mg/dL   BUN 27 (H) 6 - 20 mg/dL   Creatinine, Ser 1.321.28 (H) 0.61 - 1.24 mg/dL   Calcium 9.1 8.9 - 44.010.3 mg/dL   GFR calc non Af Amer >60 >60 mL/min   GFR calc Af Amer >60 >60 mL/min   Anion gap 6 5 - 15  Vitamin B12     Status: None   Collection Time: 11/18/16  9:07 AM  Result Value Ref Range   Vitamin B-12 476 180 - 914 pg/mL  Iron and TIBC     Status: Abnormal  Collection Time: 11/18/16  9:07 AM  Result Value Ref Range   Iron 33 (L) 45 - 182 ug/dL   TIBC 161 096 - 045 ug/dL   Saturation Ratios 13 (L) 17.9 - 39.5 %   UIBC 218 ug/dL  Ferritin     Status: None   Collection Time: 11/18/16  9:07 AM  Result Value Ref Range   Ferritin 90 24 - 336 ng/mL  Reticulocytes     Status: None   Collection Time: 11/18/16  9:07 AM  Result Value Ref Range   Retic Ct Pct 1.5 0.4 - 3.1 %   RBC. 4.25 4.22 - 5.81 MIL/uL   Retic Count, Manual 63.8 19.0 - 186.0 K/uL  CBC      Status: Abnormal   Collection Time: 11/18/16  9:07 AM  Result Value Ref Range   WBC 12.4 (H) 4.0 - 10.5 K/uL   RBC 4.17 (L) 4.22 - 5.81 MIL/uL   Hemoglobin 12.6 (L) 13.0 - 17.0 g/dL   HCT 40.9 81.1 - 91.4 %   MCV 95.4 78.0 - 100.0 fL   MCH 30.2 26.0 - 34.0 pg   MCHC 31.7 30.0 - 36.0 g/dL   RDW 78.2 (H) 95.6 - 21.3 %   Platelets 227 150 - 400 K/uL    Studies/Results: No results found.    Assessment: GI bleeding secondary to Mallory-Weiss tear status post endoscopic therapy Reported hepatitis C with normal liver function tests.  Plan: Advanced diet slowly, hopefully home tomorrow.  Gabriel Garcia C 11/18/2016, 11:18 AM  Pager [phone] If no answer or after 5 PM call [phone]

## 2016-11-19 DIAGNOSIS — F1020 Alcohol dependence, uncomplicated: Secondary | ICD-10-CM

## 2016-11-19 LAB — BASIC METABOLIC PANEL
Anion gap: 8 (ref 5–15)
BUN: 13 mg/dL (ref 6–20)
CALCIUM: 9 mg/dL (ref 8.9–10.3)
CO2: 27 mmol/L (ref 22–32)
Chloride: 107 mmol/L (ref 101–111)
Creatinine, Ser: 1.08 mg/dL (ref 0.61–1.24)
GFR calc non Af Amer: >60 mL/min (ref >60)
Glucose, Bld: 115 mg/dL — ABNORMAL HIGH (ref 65–99)
Potassium: 4.4 mmol/L (ref 3.5–5.1)
SODIUM: 142 mmol/L (ref 135–145)

## 2016-11-19 LAB — CBC
HCT: 36.1 % — ABNORMAL LOW (ref 39.0–52.0)
Hemoglobin: 11.7 g/dL — ABNORMAL LOW (ref 13.0–17.0)
MCH: 30.8 pg (ref 26.0–34.0)
MCHC: 32.4 g/dL (ref 30.0–36.0)
MCV: 95 fL (ref 78.0–100.0)
PLATELETS: 219 10*3/uL (ref 150–400)
RBC: 3.8 MIL/uL — AB (ref 4.22–5.81)
RDW: 15.1 % (ref 11.5–15.5)
WBC: 8.8 10*3/uL (ref 4.0–10.5)

## 2016-11-19 LAB — HEMOGLOBIN AND HEMATOCRIT, BLOOD
HCT: 36.1 % — ABNORMAL LOW (ref 39.0–52.0)
Hemoglobin: 11.7 g/dL — ABNORMAL LOW (ref 13.0–17.0)

## 2016-11-19 LAB — MAGNESIUM: MAGNESIUM: 2 mg/dL (ref 1.7–2.4)

## 2016-11-19 MED ORDER — SODIUM CHLORIDE 0.9 % IV BOLUS (SEPSIS)
500.0000 mL | Freq: Once | INTRAVENOUS | Status: AC
  Administered 2016-11-19: 500 mL via INTRAVENOUS

## 2016-11-19 NOTE — Progress Notes (Signed)
PROGRESS NOTE                                                                                                                                                                                                             Patient Demographics:    Gabriel Garcia, is a 41 y.o. male, DOB - 01/06/1976, ZOX:[id number]  Admit date - 11/17/2016   Admitting Physician Gabriel SalterPhillip M Hobbs, MD  Outpatient Primary MD for the patient is Gabriel ConchStephen Hunter, MD  LOS - 2  Chief Complaint  Patient presents with  . Hematemesis       Brief Narrative  Gabriel Garcia is a 41 y.o. male with medical history significant for duodenal ulcer/GI bleed, EtOH abuse says he has quit several months ago, substance abuse hypertension presents to the emergency Department chief complaint of hematemesis. Was admitted to the hospital for upper GI bleed caused by Gabriel Garcia tear   Subjective:    Gabriel Garcia today has, No headache, No chest pain, Mild epigastric abdominal pain - No Nausea, No new weakness tingling or numbness, No Cough - SOB.     Assessment  & Plan :     1.Mallory-Garcia tear, gastritis, esophagitis. Upper GI bleed with acute blood loss plated anemia. EGD done showing esophagitis, gastritis, Mallory-Garcia tear and some blood in the stomach, Mallory-Garcia tear was cauterized, we are monitoring H&H currently not requiring transfusion, he is on IV PPI drip which will be continued, discussed with GI, Currently on clears and H&H stable, he does have some epigastric discomfort hence we'll monitor another 24 hours.  2. History of cocaine abuse. He denies using alcohol and says he has quit several months ago and has had no drinks. Counseled to quit all, supportive care. We'll place on folic acid and thiamine. Should not go in DTs if he has not had alcohol in several months.  3. Hypertension. As needed hydralazine, will avoid beta blocker due to ongoing  cocaine abuse.  4. History of hep C. Per patient he is in remission, No acute issue outpatient GI and ID follow-up.  5. ARF due to dehydration. Resolved with IV fluids.  6. OSA. Continue CPAP daily at bedtime.   Diet : Diet full liquid Room service appropriate? Yes; Fluid consistency: Thin    Family Communication  :  None  Code Status :  Full  Disposition Plan  :  Home in am  Consults  :  GI  Procedures  :    EGD showing esophagitis, gastritis, Mallory-Garcia tear and old blood in the stomach.  DVT Prophylaxis  :   SCDs    Lab Results  Component Value Date   PLT 219 11/19/2016    Inpatient Medications  Scheduled Meds: . diltiazem  90 mg Oral Q8H  . folic acid  1 mg Oral Daily  . multivitamin with minerals  1 tablet Oral Daily  . thiamine  100 mg Oral Daily   Continuous Infusions: . pantoprozole (PROTONIX) infusion 8 mg/hr (11/18/16 2154)   PRN Meds:.Influenza vac split quadrivalent PF, [DISCONTINUED] ondansetron **OR** ondansetron (ZOFRAN) IV, zolpidem  Antibiotics  :    Anti-infectives    None         Objective:   Vitals:   11/18/16 1620 11/18/16 2152 11/19/16 0040 11/19/16 0426  BP: 124/87 137/80  (!) 97/59  Pulse: 91 81  81  Resp:  16  17  Temp: 98.1 F (36.7 C) 98.7 F (37.1 C)  98.7 F (37.1 C)  TempSrc: Oral     SpO2: 98% 99%  98%  Weight:   105 kg (231 lb 7.7 oz)   Height:        Wt Readings from Last 3 Encounters:  11/19/16 105 kg (231 lb 7.7 oz)  10/28/16 109.7 kg (241 lb 12.8 oz)  10/11/16 104.3 kg (230 lb)     Intake/Output Summary (Last 24 hours) at 11/19/16 1129 Last data filed at 11/19/16 0600  Gross per 24 hour  Intake             2210 ml  Output             1000 ml  Net             1210 ml     Physical Exam  Awake Alert, Oriented X 3, No new F.N deficits, Normal affect [place].AT,PERRAL Supple Neck,No JVD, No cervical lymphadenopathy appriciated.  Symmetrical Chest wall movement, Good air movement bilaterally,  CTAB RRR,No Gallops,Rubs or new Murmurs, No Parasternal Heave +ve B.Sounds, Abd Soft, No tenderness, No organomegaly appriciated, No rebound - guarding or rigidity. No Cyanosis, Clubbing or edema, No new Rash or bruise     Data Review:    CBC  Recent Labs Lab 11/17/16 1258 11/17/16 2212 11/18/16 0050 11/18/16 0907 11/19/16 0652 11/19/16 0843  WBC 19.3* 20.1* 16.9* 12.4* 8.8  --   HGB 14.0 12.9* 12.9* 12.6* 11.7* 11.7*  HCT 42.4 39.5 40.1 39.8 36.1* 36.1*  PLT 331 247 240 227 219  --   MCV 93.8 93.8 93.7 95.4 95.0  --   MCH 31.0 30.6 30.1 30.2 30.8  --   MCHC 33.0 32.7 32.2 31.7 32.4  --   RDW 14.2 14.9 15.1 15.6* 15.1  --     Chemistries   Recent Labs Lab 11/17/16 1258 11/18/16 0356 11/19/16 0652  NA 137 141 142  K 4.6 4.1 4.4  CL 102 111 107  CO2 20* 24 27  GLUCOSE 166* 97 115*  BUN 35* 27* 13  CREATININE 2.01* 1.28* 1.08  CALCIUM 10.6* 9.1 9.0  MG  --   --  2.0  AST 24  --   --   ALT 24  --   --   ALKPHOS 50  --   --   BILITOT 1.2  --   --    ------------------------------------------------------------------------------------------------------------------  No results for input(s): CHOL, HDL, LDLCALC, TRIG, CHOLHDL, LDLDIRECT in the last 72 hours.  Lab Results  Component Value Date   HGBA1C 5.3 10/04/2016   ------------------------------------------------------------------------------------------------------------------ No results for input(s): TSH, T4TOTAL, T3FREE, THYROIDAB in the last 72 hours.  Invalid input(s): FREET3 ------------------------------------------------------------------------------------------------------------------  Recent Labs  11/18/16 0907  VITAMINB12 476  FOLATE 44.7  FERRITIN 90  TIBC 251  IRON 33*  RETICCTPCT 1.5    Coagulation profile  Recent Labs Lab 11/17/16 1258  INR 1.10    No results for input(s): DDIMER in the last 72 hours.  Cardiac Enzymes No results for input(s): CKMB, TROPONINI, MYOGLOBIN in the  last 168 hours.  Invalid input(s): CK ------------------------------------------------------------------------------------------------------------------ No results found for: BNP  Micro Results Recent Results (from the past 240 hour(s))  MRSA PCR Screening     Status: None   Collection Time: 11/17/16  6:00 PM  Result Value Ref Range Status   MRSA by PCR NEGATIVE NEGATIVE Final    Comment:        The GeneXpert MRSA Assay (FDA approved for NASAL specimens only), is one component of a comprehensive MRSA colonization surveillance program. It is not intended to diagnose MRSA infection nor to guide or monitor treatment for MRSA infections.     Radiology Reports No results found.  Time Spent in minutes  30   SINGH,PRASHANT K M.D on 11/19/2016 at 11:29 AM  Between 7am to 7pm - Pager - [phone]  After 7pm go to [email or web address] - password [facility]  [facility] -  Office  [phone]

## 2016-11-20 DIAGNOSIS — B1710 Acute hepatitis C without hepatic coma: Secondary | ICD-10-CM

## 2016-11-20 DIAGNOSIS — K920 Hematemesis: Secondary | ICD-10-CM

## 2016-11-20 LAB — HEMOGLOBIN AND HEMATOCRIT, BLOOD
HEMATOCRIT: 35.9 % — AB (ref 39.0–52.0)
Hemoglobin: 11.7 g/dL — ABNORMAL LOW (ref 13.0–17.0)

## 2016-11-20 MED ORDER — GI COCKTAIL ~~LOC~~
30.0000 mL | Freq: Once | ORAL | Status: AC
  Administered 2016-11-20: 30 mL via ORAL

## 2016-11-20 MED ORDER — PANTOPRAZOLE SODIUM 40 MG PO TBEC: 40.0000 mg | DELAYED_RELEASE_TABLET | Freq: Two times a day (BID) | ORAL | 3 refills | Status: DC

## 2016-11-20 NOTE — Discharge Summary (Signed)
Gabriel Garcia RUE:[id number] DOB: 1975/12/25 DOA: 11/17/2016  PCP: Tana Conch, MD  Admit date: 11/17/2016  Discharge date: 11/20/2016  Admitted From: Home   Disposition:  Home   Recommendations for Outpatient Follow-up:   Follow up with PCP in 1-2 weeks  PCP Please obtain BMP/CBC, 2 view CXR in 1week,  (see Discharge instructions)   PCP Please follow up on the following pending results: None   Home Health: None  Equipment/Devices: None  Consultations: GI Discharge Condition: Stable   CODE STATUS: Full   Diet Recommendation: DIET SOFT    Chief Complaint  Patient presents with  . Hematemesis     Brief history of present illness from the day of admission and additional interim summary    Gabriel Garcia a 40 y.o.malewith medical history significant for duodenal ulcer/GI bleed, EtOH abuse says he has quit several months ago, substance abuse hypertension presents to the emergency Department chief complaint of hematemesis. Was admitted to the hospital for upper GI bleed caused by Melony Overly tear                                                                  Hospital Course    1.Mallory-Weiss tear, gastritis, esophagitis. Upper GI bleed with acute blood loss related anemia. EGD was done showing esophagitis, gastritis, Mallory-Weiss tear and some blood in the stomach, Mallory-Weiss tear was cauterized, Initially remained stable and he did not require transfusion, he was on IV PPI drip which will be switched to oral PPI twice a day for at least 3 months, discussed with GI Dr. Matthias Hughs on 11/19/2016, discharge on 11/20/2016, patient's symptom free requested to follow with PCP in a week and GI in 2 weeks.  2. History of cocaine abuse. He denies using alcohol and says he has quit several months ago  and has had no drinks. Counseled to quit all, supportive care. We'll place on folic acid and thiamine. Should not go in DTs if he has not had alcohol in several months.  3. Hypertension. PCP to monitor avoid beta blocker due to ongoing cocaine abuse.  4. History of hep C. Per patient he is in remission, No acute issue outpatient GI and ID follow-up.  5. ARF due to dehydration. Resolved with IV fluids.  6. OSA. Continue CPAP daily at bedtime.   Discharge diagnosis     Principal Problem:   Hematemesis with nausea Active Problems:   Hepatitis C   Esophageal reflux   Hypertension   Alcoholism (HCC)   AKI (acute kidney injury) (HCC)   Crack cocaine use   OSA (obstructive sleep apnea)   Tachycardia   Hypotension   Melena   Duodenal ulcer    Discharge instructions    Discharge Instructions    Discharge instructions    Complete  by:  As directed    Follow with Primary MD Tana Conch, MD in 7 days   Get CBC, CMP, 2 view Chest X ray checked  by Primary MD in 5-7 days ( we routinely change or add medications that can affect your baseline labs and fluid status, therefore we recommend that you get the mentioned basic workup next visit with your PCP, your PCP may decide not to get them or add new tests based on their clinical decision)  Activity: As tolerated with Full fall precautions use walker/cane & assistance as needed  Disposition Home   Diet:   DIET SOFT for 1 week then advance to Heart Healthy .  For Heart failure patients - Check your Weight same time everyday, if you gain over 2 pounds, or you develop in leg swelling, experience more shortness of breath or chest pain, call your Primary MD immediately. Follow Cardiac Low Salt Diet and 1.5 lit/day fluid restriction.  On your next visit with your primary care physician please Get Medicines reviewed and adjusted.  Please request your Prim.MD to go over all Hospital Tests and Procedure/Radiological results at the follow  up, please get all Hospital records sent to your Prim MD by signing hospital release before you go home.  If you experience worsening of your admission symptoms, develop shortness of breath, life threatening emergency, suicidal or homicidal thoughts you must seek medical attention immediately by calling 911 or calling your MD immediately  if symptoms less severe.  You Must read complete instructions/literature along with all the possible adverse reactions/side effects for all the Medicines you take and that have been prescribed to you. Take any new Medicines after you have completely understood and accpet all the possible adverse reactions/side effects.   Do not drive, operate heavy machinery, perform activities at heights, swimming or participation in water activities or provide baby sitting services if your were admitted for syncope or siezures until you have seen by Primary MD or a Neurologist and advised to do so again.  Do not drive when taking Pain medications.    Do not take more than prescribed Pain, Sleep and Anxiety Medications  Special Instructions: If you have smoked or chewed Tobacco  in the last 2 yrs please stop smoking, stop any regular Alcohol  and or any Recreational drug use.  Wear Seat belts while driving.   Please note  You were cared for by a hospitalist during your hospital stay. If you have any questions about your discharge medications or the care you received while you were in the hospital after you are discharged, you can call the unit and asked to speak with the hospitalist on call if the hospitalist that took care of you is not available. Once you are discharged, your primary care physician will handle any further medical issues. Please note that NO REFILLS for any discharge medications will be authorized once you are discharged, as it is imperative that you return to your primary care physician (or establish a relationship with a primary care physician if you do not  have one) for your aftercare needs so that they can reassess your need for medications and monitor your lab values.  Gabriel Garcia was admitted to the Hospital on 11/17/2016 and Discharged  11/20/2016 and should be excused from work/school   for 5  days starting from date -  11/17/2016 , may return to work/school without any restrictions.  Call Susa Raring MD, [facility]  [phone] with questions.  Gabriel Garcia K M.D  on 11/20/2016,at 9:43 AM  [facility]   Office  [phone]   Increase activity slowly    Complete by:  As directed       Discharge Medications   Allergies as of 11/20/2016   No Known Allergies     Medication List    STOP taking these medications   indomethacin 25 MG capsule Commonly known as:  INDOCIN     TAKE these medications   lisinopril-hydrochlorothiazide 20-12.5 MG tablet Commonly known as:  PRINZIDE,ZESTORETIC Take 1 tablet by mouth daily.   pantoprazole 40 MG tablet Commonly known as:  PROTONIX Take 1 tablet (40 mg total) by mouth 2 (two) times daily. Can switch to any approved PPI   valACYclovir 500 MG tablet Commonly known as:  VALTREX Take 1 tablet (500 mg total) by mouth 2 (two) times daily. For 3 days with outbreaks What changed:  when to take this  additional instructions       Follow-up Information    Tana ConchStephen Hunter, MD. Schedule an appointment as soon as possible for a visit in 1 week.   Specialty:  Family Medicine Why:  Appointment Date: 11/26/2016 2:30p  Contact information: [address] [place] [address] [phone]        Shirley FriarSCHOOLER,VINCENT C., MD. Schedule an appointment as soon as possible for a visit in 2 weeks.   Specialty:  Gastroenterology Why:  Please call office at phone number [phone] to speak with a office scheduler about insurance before making a future appointment. Thank you.  Contact information: [address] [place] [address] [phone]            Major procedures and Radiology Reports - PLEASE review detailed and final reports thoroughly  -      EGD showing esophagitis, gastritis, Mallory-Weiss tear and old blood in the stomach.     No results found.  Micro Results     Recent Results (from the past 240 hour(s))  MRSA PCR Screening     Status: None   Collection Time: 11/17/16  6:00 PM  Result Value Ref Range Status   MRSA by PCR NEGATIVE NEGATIVE Final    Comment:        The GeneXpert MRSA Assay (FDA approved for NASAL specimens only), is one component of a comprehensive MRSA colonization surveillance program. It is not intended to diagnose MRSA infection nor to guide or monitor treatment for MRSA infections.     Today   Subjective    Gabriel Garcia today has no headache,no chest abdominal pain,no new weakness tingling or numbness, feels much better wants to go home today.    Objective   Blood pressure 114/74, pulse 86, temperature 97.8 F (36.6 C), resp. rate 19, height 5\' 10"  (1.778 m), weight 100.7 kg (222 lb), SpO2 97 %.   Intake/Output Summary (Last 24 hours) at 11/20/16 0943 Last data filed at 11/20/16 0447  Gross per 24 hour  Intake             1295 ml  Output             2425 ml  Net            -1130 ml    Exam Awake Alert, Oriented x 3, No new F.N deficits, Normal affect [place].AT,PERRAL Supple Neck,No JVD, No cervical lymphadenopathy appriciated.  Symmetrical Chest wall movement, Good air movement bilaterally, CTAB RRR,No Gallops,Rubs or new Murmurs, No Parasternal Heave +ve B.Sounds, Abd Soft, Non tender, No organomegaly  appriciated, No rebound -guarding or rigidity. No Cyanosis, Clubbing or edema, No new Rash or bruise   Data Review   CBC w Diff:  Lab Results  Component Value Date   WBC 8.8 11/19/2016   HGB 11.7 (L) 11/19/2016   HCT 35.9 (L) 11/19/2016   PLT 219 11/19/2016   LYMPHOPCT 35 04/22/2016   MONOPCT 9 04/22/2016   EOSPCT 1 04/22/2016   BASOPCT 0 04/22/2016     CMP:  Lab Results  Component Value Date   NA 142 11/19/2016   K 4.4 11/19/2016   CL 107 11/19/2016   CO2 27 11/19/2016   BUN 13 11/19/2016   CREATININE 1.08 11/19/2016   PROT 7.3 11/17/2016   ALBUMIN 4.1 11/17/2016   BILITOT 1.2 11/17/2016   ALKPHOS 50 11/17/2016   AST 24 11/17/2016   ALT 24 11/17/2016  .   Total Time in preparing paper work, data evaluation and todays exam - 35 minutes  Leroy Sea M.D on 11/20/2016 at 9:43 AM  [facility]   Office  [phone]

## 2016-11-20 NOTE — Progress Notes (Signed)
Patient discharged to home, IV and tele were discontinued, AVS was reviewed, work note and discharge instructions provided and reviewed, follow up appointments scheduled. Patient refused wheelchair, ambulated to elevator. V/S stable with not complaints.

## 2016-11-20 NOTE — Discharge Instructions (Signed)
Follow with Primary MD Tana Conch, MD in 7 days   Get CBC, CMP, 2 view Chest X ray checked  by Primary MD in 5-7 days ( we routinely change or add medications that can affect your baseline labs and fluid status, therefore we recommend that you get the mentioned basic workup next visit with your PCP, your PCP may decide not to get them or add new tests based on their clinical decision)  Activity: As tolerated with Full fall precautions use walker/cane & assistance as needed  Disposition Home   Diet:   DIET SOFT for 1 week then advance to Heart Healthy .  For Heart failure patients - Check your Weight same time everyday, if you gain over 2 pounds, or you develop in leg swelling, experience more shortness of breath or chest pain, call your Primary MD immediately. Follow Cardiac Low Salt Diet and 1.5 lit/day fluid restriction.  On your next visit with your primary care physician please Get Medicines reviewed and adjusted.  Please request your Prim.MD to go over all Hospital Tests and Procedure/Radiological results at the follow up, please get all Hospital records sent to your Prim MD by signing hospital release before you go home.  If you experience worsening of your admission symptoms, develop shortness of breath, life threatening emergency, suicidal or homicidal thoughts you must seek medical attention immediately by calling 911 or calling your MD immediately  if symptoms less severe.  You Must read complete instructions/literature along with all the possible adverse reactions/side effects for all the Medicines you take and that have been prescribed to you. Take any new Medicines after you have completely understood and accpet all the possible adverse reactions/side effects.   Do not drive, operate heavy machinery, perform activities at heights, swimming or participation in water activities or provide baby sitting services if your were admitted for syncope or siezures until you have seen by  Primary MD or a Neurologist and advised to do so again.  Do not drive when taking Pain medications.    Do not take more than prescribed Pain, Sleep and Anxiety Medications  Special Instructions: If you have smoked or chewed Tobacco  in the last 2 yrs please stop smoking, stop any regular Alcohol  and or any Recreational drug use.  Wear Seat belts while driving.   Please note  You were cared for by a hospitalist during your hospital stay. If you have any questions about your discharge medications or the care you received while you were in the hospital after you are discharged, you can call the unit and asked to speak with the hospitalist on call if the hospitalist that took care of you is not available. Once you are discharged, your primary care physician will handle any further medical issues. Please note that NO REFILLS for any discharge medications will be authorized once you are discharged, as it is imperative that you return to your primary care physician (or establish a relationship with a primary care physician if you do not have one) for your aftercare needs so that they can reassess your need for medications and monitor your lab values.                                                      Hafiz Irion was admitted to the Hospital on 11/17/2016 and  Discharged  11/20/2016 and should be excused from work/school   for 5  days starting from date -  11/17/2016 , may return to work/school without any restrictions.  Call Susa RaringPrashant Euclide Granito MD, [facility]  [phone] with questions.  Leroy SeaSINGH,Virga Haltiwanger K M.D on 11/20/2016,at 9:43 AM  [facility]   Office  [phone]

## 2016-11-21 LAB — TYPE AND SCREEN
ABO/RH(D): A POS
Antibody Screen: NEGATIVE
UNIT DIVISION: 0
UNIT DIVISION: 0

## 2016-11-22 ENCOUNTER — Telehealth: Payer: Self-pay

## 2016-11-22 NOTE — Telephone Encounter (Signed)
D/C 11/20/16 To: home  Spoke with pt and he states that he is still very weak and fatigued. He is eating soft and bland foods, but is afraid to eat too much. Advised pt that this was a good plan and to also try and avoid spicy/salty foods. Pt is concerned about returning to work, but thinks his employer will not be happy if he does not come in. He is due to return to work Sunday night. He will call if he is not able to complete his shift. Advised pt that he does need to be seen soon for his follow up. Pt would like to come in 12/02/16. Appt scheduled for that date, pending his ability to work. Pt to call for sooner appt if he cannot continue to work.   Transition Care Management Follow-up Telephone Call  How have you been since you were released from the hospital? Tired and weak   Do you understand why you were in the hospital? yes   Do you understand the discharge instrcutions? yes  Items Reviewed:  Medications reviewed: yes  Allergies reviewed: yes  Dietary changes reviewed: yes  Referrals reviewed: yes   Functional Questionnaire:   Activities of Daily Living (ADLs):   He states they are independent in the following: ambulation, bathing and hygiene, feeding, continence, grooming, toileting and dressing States they require assistance with the following: none   Any transportation issues/concerns?: no   Any patient concerns? no   Confirmed importance and date/time of follow-up visits scheduled: yes   Confirmed with patient if condition begins to worsen call PCP or go to the ER.  Patient was given the Call-a-Nurse line [phone]: yes

## 2016-11-26 ENCOUNTER — Ambulatory Visit: Payer: Self-pay | Admitting: Family Medicine

## 2016-12-03 ENCOUNTER — Encounter: Payer: Self-pay | Admitting: Family Medicine

## 2016-12-03 ENCOUNTER — Ambulatory Visit (INDEPENDENT_AMBULATORY_CARE_PROVIDER_SITE_OTHER): Payer: 59 | Admitting: Family Medicine

## 2016-12-03 VITALS — BP 124/88 | HR 100 | Temp 98.1°F | Ht 70.0 in | Wt 230.2 lb

## 2016-12-03 DIAGNOSIS — F1020 Alcohol dependence, uncomplicated: Secondary | ICD-10-CM | POA: Diagnosis not present

## 2016-12-03 DIAGNOSIS — N179 Acute kidney failure, unspecified: Secondary | ICD-10-CM | POA: Diagnosis not present

## 2016-12-03 DIAGNOSIS — K226 Gastro-esophageal laceration-hemorrhage syndrome: Secondary | ICD-10-CM | POA: Diagnosis not present

## 2016-12-03 DIAGNOSIS — F1410 Cocaine abuse, uncomplicated: Secondary | ICD-10-CM | POA: Diagnosis not present

## 2016-12-03 DIAGNOSIS — I10 Essential (primary) hypertension: Secondary | ICD-10-CM | POA: Diagnosis not present

## 2016-12-03 DIAGNOSIS — D62 Acute posthemorrhagic anemia: Secondary | ICD-10-CM

## 2016-12-03 LAB — CBC
HEMATOCRIT: 41.1 % (ref 39.0–52.0)
Hemoglobin: 13.6 g/dL (ref 13.0–17.0)
MCHC: 33 g/dL (ref 30.0–36.0)
MCV: 93.3 fl (ref 78.0–100.0)
PLATELETS: 292 10*3/uL (ref 150.0–400.0)
RBC: 4.4 Mil/uL (ref 4.22–5.81)
RDW: 14.7 % (ref 11.5–15.5)
WBC: 6.8 10*3/uL (ref 4.0–10.5)

## 2016-12-03 LAB — COMPREHENSIVE METABOLIC PANEL
ALK PHOS: 51 U/L (ref 39–117)
ALT: 23 U/L (ref 0–53)
AST: 13 U/L (ref 0–37)
Albumin: 4.2 g/dL (ref 3.5–5.2)
BILIRUBIN TOTAL: 0.2 mg/dL (ref 0.2–1.2)
BUN: 11 mg/dL (ref 6–23)
CALCIUM: 9.4 mg/dL (ref 8.4–10.5)
CO2: 29 mEq/L (ref 19–32)
Chloride: 108 mEq/L (ref 96–112)
Creatinine, Ser: 1.16 mg/dL (ref 0.40–1.50)
GFR: 74.01 mL/min (ref >60.00)
Glucose, Bld: 94 mg/dL (ref 70–99)
Potassium: 4 mEq/L (ref 3.5–5.1)
Sodium: 143 mEq/L (ref 135–145)
TOTAL PROTEIN: 6.9 g/dL (ref 6.0–8.3)

## 2016-12-03 NOTE — Progress Notes (Signed)
Subjective:  Gabriel Garcia is a 41 y.o. year old very pleasant male patient who presents for transitional care management and hospital follow up for hematemesis. Patient was hospitalized from 11/17/16 to 11/20/16. A TCM phone call was completed on 11/20/16. Medical complexity moderate  41 year old with history of cocaine use though 6 months free, alcohol abuse but 2 years free.   On super bowel Sunday, Patient states on date of admission he had felt some abdominal pain. Felt similar to a gastric ulcer which bled in past leading to 4 units of blood transfusion. He then went to bathroom lik ehe needed to vomit and next thing he knew he vomited up large clump of blood and then continued to throw up blood into bag. Called EMS. D/c summary states no blood given but patient tells me he was given 2 units of blood.   Was found to have mallory weiss tear, gastritis and esophagitis. Upper GI bleed was due to this. EGD was done and mallory weiss tear was cauterized. Was discharged with idea for 1-2 week follow up with PCP for CBC, cmp, CXR.   Patient states since being hospitalized he has been more fatigued. Felt like this after gastric ulcers and transfusions in the past. He asks about light duty at work as when he is really active at time he will feel a faint left sided chest pressure and mild shortness of breath- resolves with rest. At the same time he states his calves cramp. He has also had low appetite, fatigue, and been constipated. He had one stool with slight blood in it but no hematemesis or otherblood in stool.    See problem oriented charting as well ROS- no fever, chills.    Past Medical History-  Patient Active Problem List   Diagnosis Date Noted  . Mallory-Weiss tear 12/03/2016    Priority: High  . Cocaine abuse 11/16/2015    Priority: High  . Alcoholism (HCC) 11/16/2015    Priority: High  . Hepatitis C     Priority: High  . Genital herpes 09/12/2016    Priority: Medium  . OSA  (obstructive sleep apnea) 05/28/2016    Priority: Medium  . Gout 11/16/2015    Priority: Medium  . Acute duodenal ulcer with hemorrhage 10/12/2014    Priority: Medium  . Hypertension 10/06/2014    Priority: Medium  . BPPV (benign paroxysmal positional vertigo) 09/12/2016    Priority: Low  . Obese 11/17/2015    Priority: Low  . Legally blind in right eye, as defined in [place]     Priority: Low  . Esophageal reflux 11/24/2013    Priority: Low  . EKG abnormality 10/12/2014    Medications- reviewed and updated Current Outpatient Prescriptions  Medication Sig Dispense Refill  . lisinopril-hydrochlorothiazide (PRINZIDE,ZESTORETIC) 20-12.5 MG tablet Take 1 tablet by mouth daily. 31 tablet 5  . pantoprazole (PROTONIX) 40 MG tablet Take 1 tablet (40 mg total) by mouth 2 (two) times daily. Can switch to any approved PPI 60 tablet 3  . valACYclovir (VALTREX) 500 MG tablet Take 1 tablet (500 mg total) by mouth 2 (two) times daily. For 3 days with outbreaks (Patient taking differently: Take 500 mg by mouth See admin instructions. Take 1 tablet (500 mg) by mouth twice daily for 3 days as needed for outbreaks) 30 tablet 0   No current facility-administered medications for this visit.     Objective: BP 124/88 (BP Location: Left Arm, Patient Position: Sitting, Cuff Size: Large)  Pulse 100   Temp 98.1 F (36.7 C) (Oral)   Ht 5\' 10"  (1.778 m)   Wt 230 lb 3.2 oz (104.4 kg)   SpO2 96%   BMI 33.03 kg/m  Gen: NAD, resting comfortably No mucus membrane pallor CV: RRR no murmurs rubs or gallops Lungs: CTAB no crackles, wheeze, rhonchi Abdomen: soft/nontender/nondistended/normal bowel sounds. No rebound or guarding.  Ext: no edema Skin: warm, dry Neuro: grossly normal, moves all extremities  Assessment/Plan:  Discussed reasons to seek care urgently/emergently specifically with his reported chest discomfort and shortness of breath. I discussed EKG today but he declined as stated had to pick up  daughter. Offered follow up sooner than next week but he wants to work up only if not improving- is aware of potential cardiac risk here  AKI (acute kidney injury) (HCC) aki in hospital has completely resolved at this point  Mallory-Weiss tear Led to hemorrhage and anemia- this has now resolved. He is compliant with PPI for gastritis element and plans to stay on this for 3 months as planned. Fortunately anemia is improving not worsening. That being said- he has some lingering fatigue and chest discomfort with activity that is similar to when he had prior ulceration that required transfusion. We will place him on light duty for 10 working days to give him a chance to recover. I think at his age he is low risk for cardiac disease but we discussed emergent precautions for ED visit. We also discussed coming back for EKG and cardiac workup if he hdoes not have full resolution in next few weeks as he did after prior ulceration.   Alcoholism (HCC) Has remained clean since 2016- praised efforts  Cocaine abuse Given summary concern for repeat cocaine use- interesting that urine drug screen was not done. Patient states last use was over 6 months ago0 continue to remain off of drugs  Acute blood loss anemia Hemoglobin is improving after recent GI blood loss. Did have one episode of blood in stool after hard stool but with improving hemoglobin. Encouraged patient at visit to follow up asap for any signs of recurrent bleeding.   Hypertension S: controlled on lisinopril-hctz 20-12.5mg .  BP Readings from Last 3 Encounters:  12/03/16 124/88  11/20/16 (!) 134/99  10/28/16 118/80  A/P:Continue current meds:  Doing very well today  work note written 10 days light duty  Orders Placed This Encounter  Procedures  . DG Chest 2 View    Standing Status:   Future    Standing Expiration Date:   01/31/2018    Order Specific Question:   Reason for Exam (SYMPTOM  OR DIAGNOSIS REQUIRED)    Answer:   mallory weiss  tear. advised x-ray by hospitalist    Order Specific Question:   Preferred imaging location?    Answer:   [facility]  . CBC    [place]  . Comprehensive metabolic panel    [place]   Return precautions advised.  Tana ConchStephen Tonisha Silvey, MD

## 2016-12-03 NOTE — Assessment & Plan Note (Signed)
Has remained clean since 2016- praised efforts

## 2016-12-03 NOTE — Progress Notes (Signed)
Pre visit review using our clinic review tool, if applicable. No additional management support is needed unless otherwise documented below in the visit note. 

## 2016-12-03 NOTE — Patient Instructions (Addendum)
Please stop by lab before you go  Please go to [facility] - located [address] across [address] from [facility] - in the basement - Hours: 8:30-5:30 PM M-F. Do not need appointment.   Continue protonix/pantoprazole  Light duty for 10 more work days. If your symptoms of chest discomfort do not resolve- you need to be seen and we need to update EKG and consider cardiology follow up (as long as blood counts are not low as the cause)

## 2016-12-03 NOTE — Assessment & Plan Note (Signed)
Given summary concern for repeat cocaine use- interesting that urine drug screen was not done. Patient states last use was over 6 months ago0 continue to remain off of drugs

## 2016-12-03 NOTE — Assessment & Plan Note (Addendum)
Led to hemorrhage and anemia- this has now resolved. He is compliant with PPI for gastritis element and plans to stay on this for 3 months as planned. Fortunately anemia is improving not worsening. That being said- he has some lingering fatigue and chest discomfort with activity that is similar to when he had prior ulceration that required transfusion. We will place him on light duty for 10 working days to give him a chance to recover. I think at his age he is low risk for cardiac disease but we discussed emergent precautions for ED visit. We also discussed coming back for EKG and cardiac workup if he hdoes not have full resolution in next few weeks as he did after prior ulceration.

## 2016-12-03 NOTE — Assessment & Plan Note (Signed)
aki in hospital has completely resolved at this point

## 2016-12-03 NOTE — Assessment & Plan Note (Signed)
Hemoglobin is improving after recent GI blood loss. Did have one episode of blood in stool after hard stool but with improving hemoglobin. Encouraged patient at visit to follow up asap for any signs of recurrent bleeding.

## 2016-12-03 NOTE — Assessment & Plan Note (Signed)
S: controlled on lisinopril-hctz 20-12.5mg .  BP Readings from Last 3 Encounters:  12/03/16 124/88  11/20/16 (!) 134/99  10/28/16 118/80  A/P:Continue current meds:  Doing very well today

## 2016-12-04 ENCOUNTER — Telehealth: Payer: Self-pay | Admitting: Family Medicine

## 2016-12-04 NOTE — Telephone Encounter (Signed)
Pt states his employer does not have anything he can do light duty Pt is forced to return to work regular duty.  Would like dr Durene CalHunter to re write a letter stating ok for him to return to work for [employer] duty, starting tonight, (pt works 3rd shift), dated for today's date. Pt needs that letter faxed to:  Attn: Talbert Forestommy Lambert Fax:  [phone]

## 2016-12-04 NOTE — Telephone Encounter (Signed)
Pt needs note to be fax before 330 pm

## 2016-12-04 NOTE — Telephone Encounter (Signed)
May send letter.   Since no light duty is available, patient may return to work full duty. If he has any worsening symptoms or symptoms that fail to improve, he should follow up for medical care immediately.

## 2016-12-20 NOTE — Telephone Encounter (Signed)
[name]- why was this letter not sent?

## 2016-12-20 NOTE — Telephone Encounter (Signed)
Called patient and left a voicemail message asking for a return phone call. 

## 2017-01-02 ENCOUNTER — Emergency Department (HOSPITAL_COMMUNITY)
Admission: EM | Admit: 2017-01-02 | Discharge: 2017-01-02 | Disposition: A | Discharge status: Home / Self Care | Payer: 59 | Attending: Emergency Medicine | Admitting: Emergency Medicine

## 2017-01-02 ENCOUNTER — Encounter (HOSPITAL_COMMUNITY): Payer: Self-pay

## 2017-01-02 DIAGNOSIS — X131XXA Other contact with steam and other hot vapors, initial encounter: Secondary | ICD-10-CM | POA: Insufficient documentation

## 2017-01-02 DIAGNOSIS — T22012A Burn of unspecified degree of left forearm, initial encounter: Secondary | ICD-10-CM | POA: Diagnosis present

## 2017-01-02 DIAGNOSIS — I10 Essential (primary) hypertension: Secondary | ICD-10-CM | POA: Insufficient documentation

## 2017-01-02 DIAGNOSIS — T22212A Burn of second degree of left forearm, initial encounter: Secondary | ICD-10-CM | POA: Insufficient documentation

## 2017-01-02 DIAGNOSIS — Y999 Unspecified external cause status: Secondary | ICD-10-CM | POA: Diagnosis not present

## 2017-01-02 DIAGNOSIS — Y939 Activity, unspecified: Secondary | ICD-10-CM | POA: Diagnosis not present

## 2017-01-02 DIAGNOSIS — Z79899 Other long term (current) drug therapy: Secondary | ICD-10-CM | POA: Diagnosis not present

## 2017-01-02 DIAGNOSIS — Y929 Unspecified place or not applicable: Secondary | ICD-10-CM | POA: Insufficient documentation

## 2017-01-02 MED ORDER — SILVER SULFADIAZINE 1 % EX CREA: 1.0000 "application " | TOPICAL_CREAM | Freq: Two times a day (BID) | CUTANEOUS | 2 refills | Status: DC

## 2017-01-02 MED ORDER — SILVER SULFADIAZINE 1 % EX CREA
TOPICAL_CREAM | Freq: Two times a day (BID) | CUTANEOUS | Status: DC
  Filled 2017-01-02: qty 50
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
## 2017-01-02 NOTE — Discharge Instructions (Signed)
1. Medications: Silvadene, usual home medications 2. Treatment: rest, drink plenty of fluids, keep wound clean with warm soap and water, keep bandages dry 3. Follow Up: Please followup with your primary doctor in 5-7 days for discussion of your diagnoses and further evaluation after today's visit; if you do not have a primary care doctor use the resource guide provided to find one; Please return to the ER for signs of infection, fever, or other concerns
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
## 2017-01-02 NOTE — ED Notes (Signed)
Pt did not wound care here as he needed to leave to pick up his daughter. Pt reported he wanted to clean, apply medicine, and perform wound care at home. Pt advised of how to perform wound care and medication instructions reviewed with pt. Pt has no questions at discharge.

## 2017-01-09 ENCOUNTER — Ambulatory Visit: Payer: 59 | Admitting: Family Medicine

## 2017-03-13 IMAGING — DX DG CHEST 2V
2 series · 2 of 2 positions shown · non-contrast
Comparison: 04/22/2016.  12/04/2012.

CLINICAL DATA: Hypertension.

EXAM:
CHEST  2 VIEW

[chest pa]
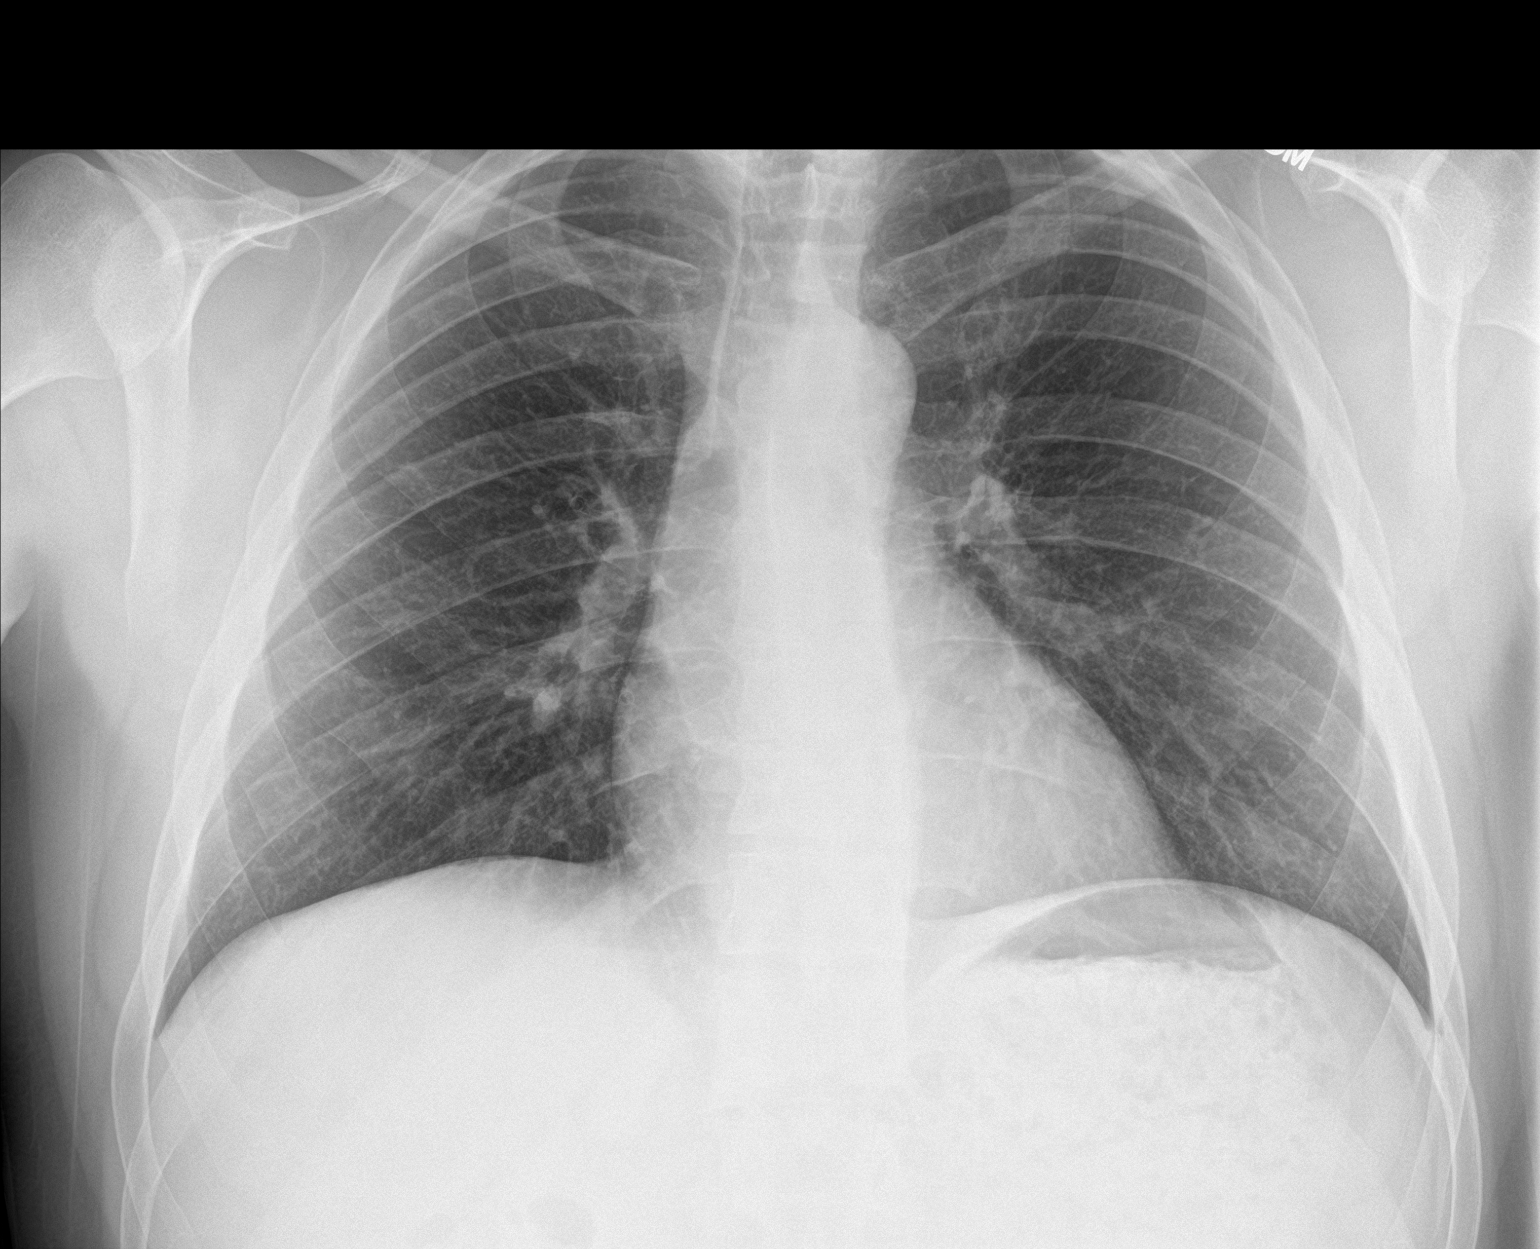

[chest lat]
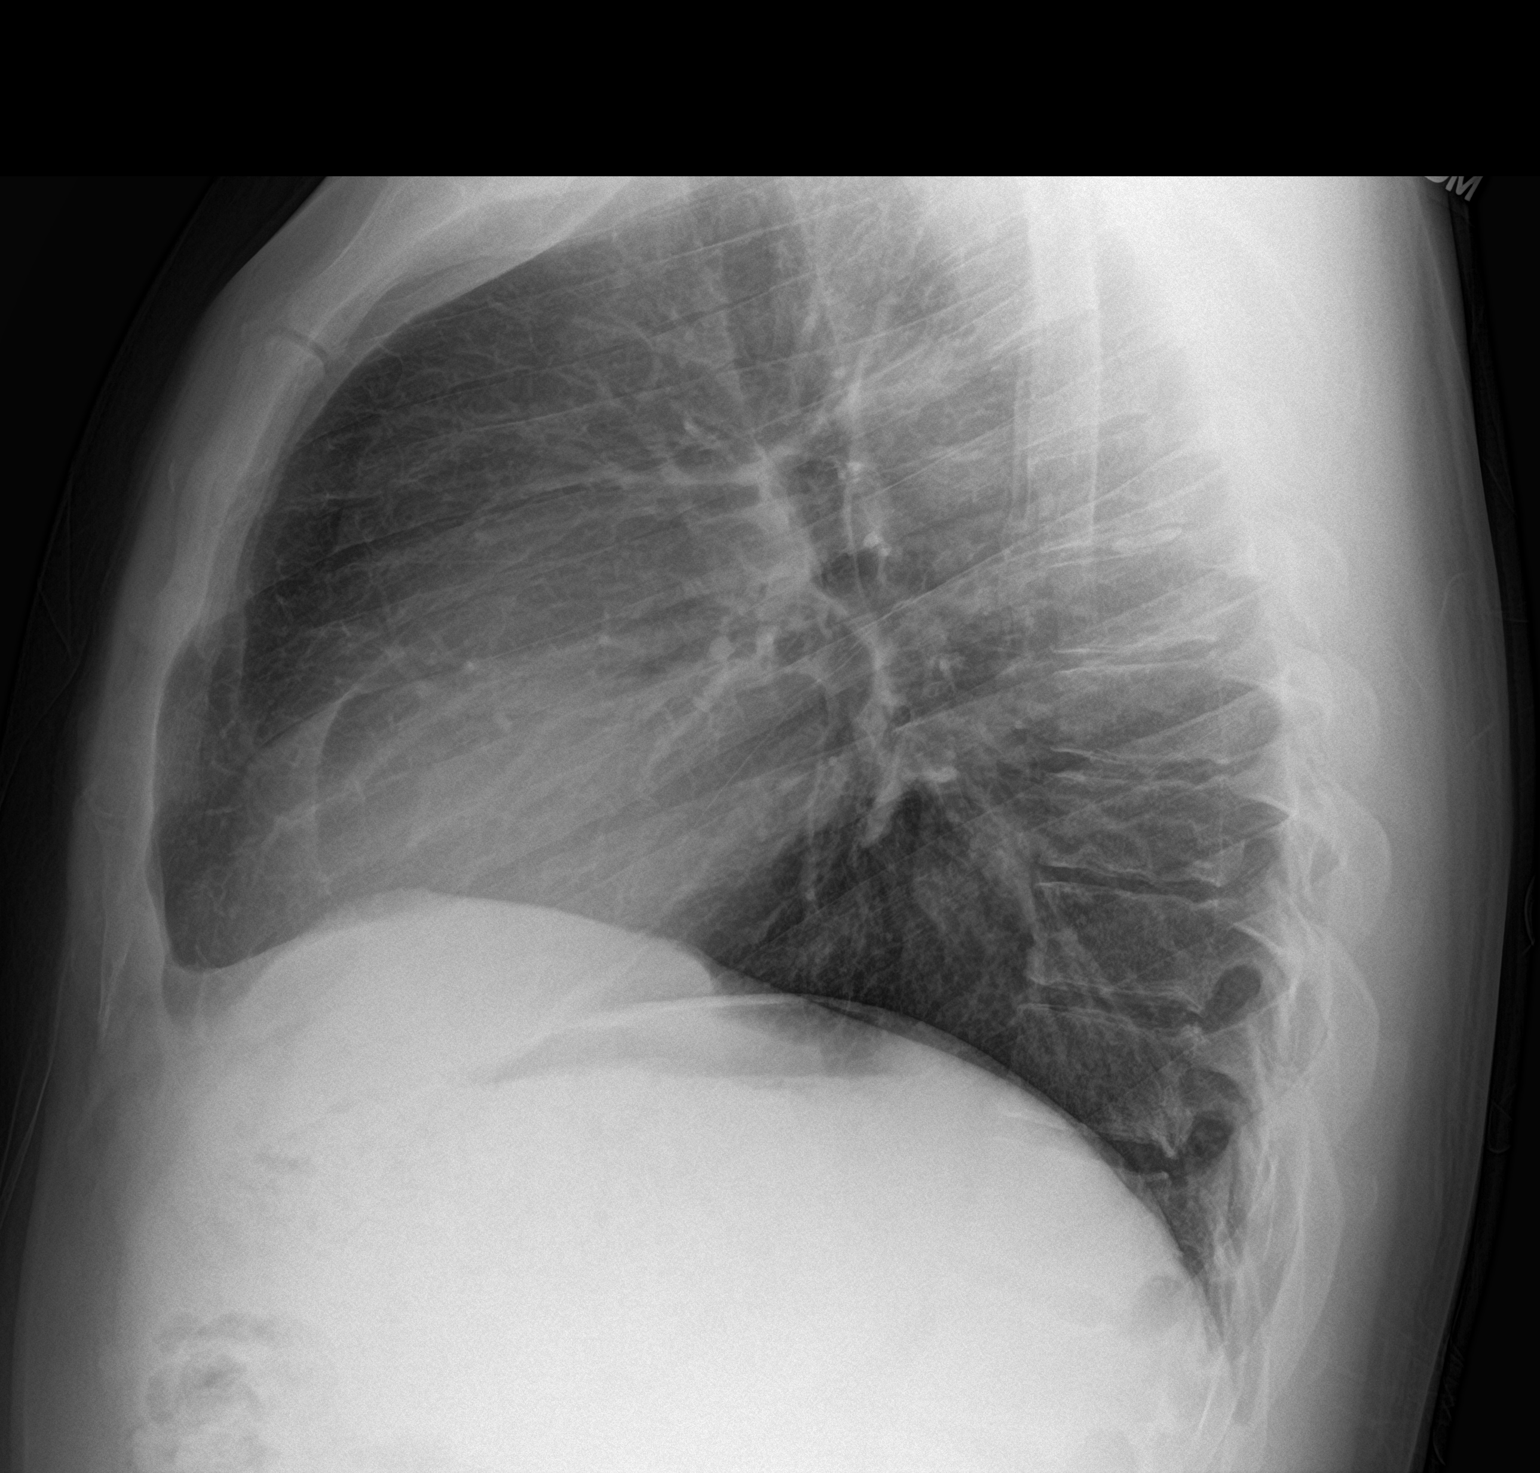

[2 of 2 positions shown; findings below may reference images not displayed]

FINDINGS: Mediastinum hilar structures normal. Lungs are clear. Heart size
normal. No pleural effusion or pneumothorax.
IMPRESSION: No acute cardiopulmonary disease.

## 2017-03-14 ENCOUNTER — Encounter (HOSPITAL_COMMUNITY): Payer: Self-pay | Admitting: Emergency Medicine

## 2017-03-14 ENCOUNTER — Emergency Department (HOSPITAL_COMMUNITY)
Admission: EM | Admit: 2017-03-14 | Discharge: 2017-03-14 | Disposition: A | Discharge status: Home / Self Care | Payer: 59 | Attending: Emergency Medicine | Admitting: Emergency Medicine

## 2017-03-14 DIAGNOSIS — X12XXXA Contact with other hot fluids, initial encounter: Secondary | ICD-10-CM | POA: Diagnosis not present

## 2017-03-14 DIAGNOSIS — Y9389 Activity, other specified: Secondary | ICD-10-CM | POA: Diagnosis not present

## 2017-03-14 DIAGNOSIS — T2019XA Burn of first degree of multiple sites of head, face, and neck, initial encounter: Secondary | ICD-10-CM | POA: Diagnosis not present

## 2017-03-14 DIAGNOSIS — Y990 Civilian activity done for income or pay: Secondary | ICD-10-CM | POA: Insufficient documentation

## 2017-03-14 DIAGNOSIS — T2017XA Burn of first degree of neck, initial encounter: Secondary | ICD-10-CM | POA: Diagnosis not present

## 2017-03-14 DIAGNOSIS — T2113XA Burn of first degree of upper back, initial encounter: Secondary | ICD-10-CM | POA: Diagnosis present

## 2017-03-14 DIAGNOSIS — I10 Essential (primary) hypertension: Secondary | ICD-10-CM | POA: Diagnosis not present

## 2017-03-14 DIAGNOSIS — T310 Burns involving less than 10% of body surface: Secondary | ICD-10-CM | POA: Diagnosis not present

## 2017-03-14 DIAGNOSIS — T300 Burn of unspecified body region, unspecified degree: Secondary | ICD-10-CM

## 2017-03-14 DIAGNOSIS — Y929 Unspecified place or not applicable: Secondary | ICD-10-CM | POA: Diagnosis not present

## 2017-03-14 MED ORDER — BACITRACIN ZINC 500 UNIT/GM EX OINT
TOPICAL_OINTMENT | CUTANEOUS | Status: AC
  Administered 2017-03-14: 1
  Filled 2017-03-14: qty 0.9

## 2017-03-14 MED ORDER — SILVER SULFADIAZINE 1 % EX CREA: 1.0000 "application " | TOPICAL_CREAM | Freq: Every day | CUTANEOUS | 0 refills | Status: DC

## 2017-03-14 MED ORDER — HYDROCODONE-ACETAMINOPHEN 5-325 MG PO TABS
1.0000 | ORAL_TABLET | Freq: Once | ORAL | Status: AC
  Administered 2017-03-14: 1 via ORAL
  Filled 2017-03-14: qty 1
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
## 2017-03-14 NOTE — Discharge Instructions (Signed)
Apply nonadherent dressing to your abdomen and change this daily. Apply Silvadene cream to your burns daily. Alternate 600 mg of ibuprofen and 500 mg of Tylenol every 3 hours for pain control. You may apply cold compresses for comfort. Make sure your wounds are clean and dry. Follow-up with your primary care for reevaluation. Return to the ED if any concerning symptoms develop.
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
## 2017-03-21 ENCOUNTER — Ambulatory Visit: Payer: Self-pay | Admitting: Family Medicine

## 2017-03-24 ENCOUNTER — Ambulatory Visit (INDEPENDENT_AMBULATORY_CARE_PROVIDER_SITE_OTHER): Payer: 59 | Admitting: Family Medicine

## 2017-03-24 ENCOUNTER — Encounter: Payer: Self-pay | Admitting: Family Medicine

## 2017-03-24 VITALS — BP 98/82 | HR 101 | Temp 97.9°F | Ht 70.0 in | Wt 204.6 lb

## 2017-03-24 DIAGNOSIS — T300 Burn of unspecified body region, unspecified degree: Secondary | ICD-10-CM

## 2017-03-24 DIAGNOSIS — T2125XA Burn of second degree of buttock, initial encounter: Secondary | ICD-10-CM

## 2017-03-24 DIAGNOSIS — I10 Essential (primary) hypertension: Secondary | ICD-10-CM

## 2017-03-24 NOTE — Assessment & Plan Note (Signed)
S: controlled on isinopril-hctz 20-12.5mg .  ASCVD 10 year risk calculation if age 140-79: needs to be calculated after next physical as has not been seen for regular follow up after turnign 40 BP Readings from Last 3 Encounters:  03/24/17 98/82  03/14/17 114/85  01/02/17 (!) 168/121  A/P: We discussed blood pressure goal of <140/90. Continue current meds:  He feels well so will tolerate current BP

## 2017-03-24 NOTE — Patient Instructions (Signed)
Glad you are healing well  Gabriel MuirJamie will fax to [facility] then give you original back
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
## 2017-06-08 ENCOUNTER — Other Ambulatory Visit: Payer: Self-pay | Admitting: Family Medicine

## 2018-04-09 ENCOUNTER — Telehealth: Payer: Self-pay | Admitting: Family Medicine

## 2018-04-09 ENCOUNTER — Other Ambulatory Visit: Payer: Self-pay

## 2018-04-09 DIAGNOSIS — A6000 Herpesviral infection of urogenital system, unspecified: Secondary | ICD-10-CM

## 2018-04-09 MED ORDER — LISINOPRIL-HYDROCHLOROTHIAZIDE 20-12.5 MG PO TABS: 1.0000 | ORAL_TABLET | Freq: Every day | ORAL | 2 refills | Status: DC

## 2018-04-09 MED ORDER — VALACYCLOVIR HCL 500 MG PO TABS: 500.0000 mg | ORAL_TABLET | Freq: Two times a day (BID) | ORAL | 0 refills | Status: DC

## 2018-04-09 NOTE — Telephone Encounter (Signed)
See note

## 2018-04-09 NOTE — Telephone Encounter (Signed)
Prescriptions sent to pharmacy as requested 

## 2018-04-09 NOTE — Telephone Encounter (Signed)
Copied from CRM [phone]#[id number]. Topic: Quick Communication - See Telephone Encounter >> Apr 09, 2018 10:04 AM Jolayne Hainesaylor, Brittany L wrote: CRM for notification. See Telephone encounter for: 04/09/18.   Patient called and said that his medications are too expensive at [facility] and wants them sent to [facility] on [address]  valACYclovir (VALTREX) 500 MG tablet lisinopril-hydrochlorothiazide (PRINZIDE,ZESTORETIC) 20-12.5 MG tablet

## 2018-05-25 ENCOUNTER — Telehealth: Payer: Self-pay | Admitting: Family Medicine

## 2018-05-25 NOTE — Telephone Encounter (Signed)
See note

## 2018-05-25 NOTE — Telephone Encounter (Signed)
Copied from CRM [phone]. Topic: General - Other >> May 25, 2018  9:54 AM [name], Gabriel Garcia wrote: Reason for CRM: pt was last seen in June 2018. Pt would like new rx indomethacin 25 mg. This med was last prescribed by er md. Pt will make an appt if its necessary. Pt has gout. [facility] [address]

## 2018-05-25 NOTE — Telephone Encounter (Signed)
Called and left a voicemail message asking patient to call and schedule an appointment for tomorrow

## 2018-06-18 ENCOUNTER — Emergency Department (HOSPITAL_COMMUNITY): Payer: 59

## 2018-06-18 ENCOUNTER — Encounter (HOSPITAL_COMMUNITY): Payer: Self-pay | Admitting: Obstetrics and Gynecology

## 2018-06-18 ENCOUNTER — Observation Stay (HOSPITAL_COMMUNITY): Payer: 59

## 2018-06-18 ENCOUNTER — Observation Stay (HOSPITAL_COMMUNITY)
Admission: EM | Admit: 2018-06-18 | Discharge: 2018-06-20 | Disposition: A | Discharge status: Home / Self Care | Payer: 59 | Attending: Internal Medicine | Admitting: Internal Medicine

## 2018-06-18 ENCOUNTER — Other Ambulatory Visit: Payer: Self-pay

## 2018-06-18 DIAGNOSIS — G4733 Obstructive sleep apnea (adult) (pediatric): Secondary | ICD-10-CM | POA: Diagnosis not present

## 2018-06-18 DIAGNOSIS — F1410 Cocaine abuse, uncomplicated: Secondary | ICD-10-CM | POA: Diagnosis not present

## 2018-06-18 DIAGNOSIS — Z79899 Other long term (current) drug therapy: Secondary | ICD-10-CM | POA: Diagnosis not present

## 2018-06-18 DIAGNOSIS — F1411 Cocaine abuse, in remission: Secondary | ICD-10-CM | POA: Diagnosis present

## 2018-06-18 DIAGNOSIS — E669 Obesity, unspecified: Secondary | ICD-10-CM | POA: Insufficient documentation

## 2018-06-18 DIAGNOSIS — K219 Gastro-esophageal reflux disease without esophagitis: Secondary | ICD-10-CM | POA: Diagnosis present

## 2018-06-18 DIAGNOSIS — R0609 Other forms of dyspnea: Secondary | ICD-10-CM | POA: Diagnosis not present

## 2018-06-18 DIAGNOSIS — F1021 Alcohol dependence, in remission: Secondary | ICD-10-CM | POA: Diagnosis present

## 2018-06-18 DIAGNOSIS — I959 Hypotension, unspecified: Secondary | ICD-10-CM | POA: Insufficient documentation

## 2018-06-18 DIAGNOSIS — R0789 Other chest pain: Principal | ICD-10-CM

## 2018-06-18 DIAGNOSIS — F1020 Alcohol dependence, uncomplicated: Secondary | ICD-10-CM | POA: Diagnosis not present

## 2018-06-18 DIAGNOSIS — Z6829 Body mass index (BMI) 29.0-29.9, adult: Secondary | ICD-10-CM | POA: Insufficient documentation

## 2018-06-18 DIAGNOSIS — I10 Essential (primary) hypertension: Secondary | ICD-10-CM | POA: Diagnosis present

## 2018-06-18 DIAGNOSIS — N179 Acute kidney failure, unspecified: Secondary | ICD-10-CM | POA: Diagnosis not present

## 2018-06-18 DIAGNOSIS — R079 Chest pain, unspecified: Secondary | ICD-10-CM | POA: Diagnosis not present

## 2018-06-18 LAB — D-DIMER, QUANTITATIVE (NOT AT ARMC)

## 2018-06-18 LAB — BASIC METABOLIC PANEL
ANION GAP: 11 (ref 5–15)
BUN: 18 mg/dL (ref 6–20)
CALCIUM: 9.9 mg/dL (ref 8.9–10.3)
CO2: 26 mmol/L (ref 22–32)
Chloride: 106 mmol/L (ref 98–111)
Creatinine, Ser: 2.61 mg/dL — ABNORMAL HIGH (ref 0.61–1.24)
GFR, EST AFRICAN AMERICAN: 33 mL/min — AB (ref >60)
GFR, EST NON AFRICAN AMERICAN: 29 mL/min — AB (ref >60)
GLUCOSE: 112 mg/dL — AB (ref 70–99)
Potassium: 4.4 mmol/L (ref 3.5–5.1)
Sodium: 143 mmol/L (ref 135–145)

## 2018-06-18 LAB — RAPID URINE DRUG SCREEN, HOSP PERFORMED
AMPHETAMINES: NOT DETECTED
BENZODIAZEPINES: NOT DETECTED
Barbiturates: NOT DETECTED
COCAINE: POSITIVE — AB
OPIATES: NOT DETECTED
TETRAHYDROCANNABINOL: NOT DETECTED

## 2018-06-18 LAB — HEMOGLOBIN A1C
HEMOGLOBIN A1C: 5.6 % (ref 4.8–5.6)
MEAN PLASMA GLUCOSE: 114.02 mg/dL

## 2018-06-18 LAB — CBC
HEMATOCRIT: 49.2 % (ref 39.0–52.0)
HEMOGLOBIN: 16.7 g/dL (ref 13.0–17.0)
MCH: 30.9 pg (ref 26.0–34.0)
MCHC: 33.9 g/dL (ref 30.0–36.0)
MCV: 90.9 fL (ref 78.0–100.0)
Platelets: 350 10*3/uL (ref 150–400)
RBC: 5.41 MIL/uL (ref 4.22–5.81)
RDW: 14 % (ref 11.5–15.5)
WBC: 10 10*3/uL (ref 4.0–10.5)

## 2018-06-18 LAB — BRAIN NATRIURETIC PEPTIDE: B NATRIURETIC PEPTIDE 5: 16.8 pg/mL (ref 0.0–100.0)

## 2018-06-18 LAB — URINALYSIS, ROUTINE W REFLEX MICROSCOPIC
Bilirubin Urine: NEGATIVE
Glucose, UA: NEGATIVE mg/dL
Hgb urine dipstick: NEGATIVE
Ketones, ur: NEGATIVE mg/dL
Nitrite: NEGATIVE
PROTEIN: NEGATIVE mg/dL
SPECIFIC GRAVITY, URINE: 1.012 (ref 1.005–1.030)
pH: 5 (ref 5.0–8.0)

## 2018-06-18 LAB — TYPE AND SCREEN
ABO/RH(D): A POS
ANTIBODY SCREEN: NEGATIVE

## 2018-06-18 LAB — POCT I-STAT TROPONIN I: Troponin i, poc: 0.03 ng/mL (ref 0.00–0.08)

## 2018-06-18 LAB — PROTIME-INR
INR: 0.89
PROTHROMBIN TIME: 12 s (ref 11.4–15.2)

## 2018-06-18 LAB — TROPONIN I: Troponin I: <0.03 ng/mL (ref <0.03)

## 2018-06-18 LAB — CG4 I-STAT (LACTIC ACID): LACTIC ACID, VENOUS: 1.34 mmol/L (ref 0.5–1.9)

## 2018-06-18 LAB — TSH: TSH: 1.408 u[IU]/mL (ref 0.350–4.500)

## 2018-06-18 LAB — CORTISOL: Cortisol, Plasma: 5.1 ug/dL

## 2018-06-18 LAB — APTT: APTT: 26 s (ref 24–36)

## 2018-06-18 LAB — SODIUM, URINE, RANDOM: SODIUM UR: 109 mmol/L

## 2018-06-18 LAB — CREATININE, URINE, RANDOM: CREATININE, URINE: 108.4 mg/dL

## 2018-06-18 MED ORDER — ONDANSETRON HCL 4 MG PO TABS: 4.0000 mg | ORAL_TABLET | Freq: Four times a day (QID) | ORAL | Status: DC | PRN

## 2018-06-18 MED ORDER — ALBUTEROL SULFATE (2.5 MG/3ML) 0.083% IN NEBU: 2.5000 mg | INHALATION_SOLUTION | RESPIRATORY_TRACT | Status: DC | PRN

## 2018-06-18 MED ORDER — SODIUM CHLORIDE 0.9 % IV BOLUS
1000.0000 mL | Freq: Once | INTRAVENOUS | Status: AC
  Administered 2018-06-18: 1000 mL via INTRAVENOUS

## 2018-06-18 MED ORDER — ONDANSETRON HCL 4 MG/2ML IJ SOLN: 4.0000 mg | Freq: Four times a day (QID) | INTRAMUSCULAR | Status: DC | PRN

## 2018-06-18 MED ORDER — SODIUM CHLORIDE 0.9 % IV SOLN
INTRAVENOUS | Status: DC
  Administered 2018-06-19 – 2018-06-20 (×3): via INTRAVENOUS

## 2018-06-18 MED ORDER — ASPIRIN EC 81 MG PO TBEC
81.0000 mg | DELAYED_RELEASE_TABLET | Freq: Every day | ORAL | Status: DC
  Administered 2018-06-19 – 2018-06-20 (×2): 81 mg via ORAL
  Filled 2018-06-18 (×2): qty 1

## 2018-06-18 MED ORDER — SODIUM CHLORIDE 0.9% FLUSH: 3.0000 mL | INTRAVENOUS | Status: DC | PRN

## 2018-06-18 MED ORDER — IPRATROPIUM BROMIDE 0.02 % IN SOLN: 0.5000 mg | Freq: Four times a day (QID) | RESPIRATORY_TRACT | Status: DC

## 2018-06-18 MED ORDER — HEPARIN SODIUM (PORCINE) 5000 UNIT/ML IJ SOLN
5000.0000 [IU] | Freq: Three times a day (TID) | INTRAMUSCULAR | Status: DC
  Administered 2018-06-19 – 2018-06-20 (×4): 5000 [IU] via SUBCUTANEOUS
  Filled 2018-06-18 (×4): qty 1

## 2018-06-18 MED ORDER — POLYETHYLENE GLYCOL 3350 17 G PO PACK: 17.0000 g | PACK | Freq: Every day | ORAL | Status: DC | PRN

## 2018-06-18 MED ORDER — ACETAMINOPHEN 325 MG PO TABS: 650.0000 mg | ORAL_TABLET | Freq: Four times a day (QID) | ORAL | Status: DC | PRN

## 2018-06-18 MED ORDER — SODIUM CHLORIDE 0.9 % IV SOLN: 250.0000 mL | INTRAVENOUS | Status: DC | PRN

## 2018-06-18 MED ORDER — ACETAMINOPHEN 650 MG RE SUPP: 650.0000 mg | Freq: Four times a day (QID) | RECTAL | Status: DC | PRN

## 2018-06-18 MED ORDER — SODIUM CHLORIDE 0.9% FLUSH: 3.0000 mL | Freq: Two times a day (BID) | INTRAVENOUS | Status: DC

## 2018-06-18 MED ORDER — SODIUM CHLORIDE 0.9% FLUSH
3.0000 mL | Freq: Two times a day (BID) | INTRAVENOUS | Status: DC
  Administered 2018-06-18: 3 mL via INTRAVENOUS

## 2018-06-18 MED ORDER — TRAMADOL HCL 50 MG PO TABS: 50.0000 mg | ORAL_TABLET | Freq: Four times a day (QID) | ORAL | Status: DC | PRN

## 2018-06-18 NOTE — ED Triage Notes (Signed)
Pt has a hx of hypertension and today is hypotensive with chest pain. Pt reports he cannot work for "5 minutes without feeling severely exhausted." pt reports pain in his neck and shoulder blades Pt is currently alert and oriented and in no acute distress, but pt's BP is 83/62. Pt took his BP medicine this morning as he thought it might be high.

## 2018-06-18 NOTE — ED Notes (Signed)
ED TO INPATIENT HANDOFF REPORT  Name/Age/Gender Gabriel Garcia 42 y.o. male  Code Status Code Status History    Date Active Date Inactive Code Status Order ID Comments User Context   11/17/2016 1355 11/20/2016 1504 Full Code [id number]  Radene Gunning, NP ED   04/22/2016 1023 04/23/2016 1622 Full Code [id number]  Elmarie Shiley, MD Inpatient   10/12/2014 1834 10/13/2014 1639 Full Code [id number]  RamaVenetia Maxon, MD Inpatient   10/06/2014 1917 10/07/2014 1345 Full Code [id number]  Allyne Gee, MD ED      Home/SNF/Other Home  Chief Complaint chest pain   Level of Care/Admitting Diagnosis ED Disposition    ED Disposition Condition [facility] Area: [facility] [[id number]]  Level of Care: Telemetry [5]  Admit to tele based on following criteria: Monitor for Ischemic changes  Diagnosis: Chest pain [076226]  Admitting Physician: Alma Friendly [[id number]]  Attending Physician: Alma Friendly [[id number]]  PT Class (Do Not Modify): Observation [104]  PT Acc Code (Do Not Modify): Observation [10022]       Medical History Past Medical History:  Diagnosis Date  . Acute duodenal ulcer with hemorrhage 10/12/2014  . GERD (gastroesophageal reflux disease)   . GI bleed   . Hepatitis C   . Hypertension   . 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 Legally blind in right eye, as defined in [place]   . Mesenteric adenitis   . Sliding hiatal hernia   . Substance abuse ([place])    cocaine  . Terminal ileitis ([place])     Allergies No Known Allergies  IV Location/Drains/Wounds Patient Lines/Drains/Airways Status   Active Line/Drains/Airways    Name:   Placement date:   Placement time:   Site:   Days:   Peripheral IV 06/18/18 Right Antecubital   06/18/18    1426    Antecubital   less than 1   Peripheral IV 06/18/18 Left Antecubital   06/18/18    1427    Antecubital   less than 1          Labs/Imaging Results for orders  placed or performed during the hospital encounter of 06/18/18 (from the past 48 hour(s))  Basic metabolic panel     Status: Abnormal   Collection Time: 06/18/18  2:12 PM  Result Value Ref Range   Sodium 143 135 - 145 mmol/L   Potassium 4.4 3.5 - 5.1 mmol/L   Chloride 106 98 - 111 mmol/L   CO2 26 22 - 32 mmol/L   Glucose, Bld 112 (H) 70 - 99 mg/dL   BUN 18 6 - 20 mg/dL   Creatinine, Ser 2.61 (H) 0.61 - 1.24 mg/dL   Calcium 9.9 8.9 - 10.3 mg/dL   GFR calc non Af Amer 29 (L) >60 mL/min   GFR calc Af Amer 33 (L) >60 mL/min    Comment: (NOTE) The eGFR has been calculated using the CKD EPI equation. This calculation has not been validated in all clinical situations. eGFR's persistently <60 mL/min signify possible Chronic Kidney Disease.    Anion gap 11 5 - 15    Comment: Performed at [facility], [place] [address], [place], [place] [address]  CBC     Status: None   Collection Time: 06/18/18  2:12 PM  Result Value Ref Range   WBC 10.0 4.0 - 10.5 K/uL   RBC 5.41 4.22 - 5.81 MIL/uL   Hemoglobin 16.7  13.0 - 17.0 g/dL   HCT 49.2 39.0 - 52.0 %   MCV 90.9 78.0 - 100.0 fL   MCH 30.9 26.0 - 34.0 pg   MCHC 33.9 30.0 - 36.0 g/dL   RDW 14.0 11.5 - 15.5 %   Platelets 350 150 - 400 K/uL    Comment: Performed at [facility], [place] [address], [place], [place] [address]  Type and screen [place]     Status: None   Collection Time: 06/18/18  2:26 PM  Result Value Ref Range   ABO/RH(D) A POS    Antibody Screen NEG    Sample Expiration      06/21/2018 Performed at [facility], [place] [address] [place], [place] [address]   Protime-INR     Status: None   Collection Time: 06/18/18  2:29 PM  Result Value Ref Range   Prothrombin Time 12.0 11.4 - 15.2 seconds   INR 0.89     Comment: Performed at [facility], [place] [address], [place], [place] [address]  APTT     Status: None   Collection Time: 06/18/18   2:29 PM  Result Value Ref Range   aPTT 26 24 - 36 seconds    Comment: Performed at [facility], [place] [address] [place],  [address]  POCT i-Stat troponin I     Status: None   Collection Time: 06/18/18  2:30 PM  Result Value Ref Range   Troponin i, poc 0.03 0.00 - 0.08 ng/mL   Comment 3            Comment: Due to the release kinetics of cTnI, a negative result within the first hours of the onset of symptoms does not rule out myocardial infarction with certainty. If myocardial infarction is still suspected, repeat the test at appropriate intervals.   CG4 I-STAT (Lactic acid)     Status: None   Collection Time: 06/18/18  2:42 PM  Result Value Ref Range   Lactic Acid, Venous 1.34 0.5 - 1.9 mmol/L   Dg Chest Port 1 View  Result Date: 06/18/2018 CLINICAL DATA:  Chest pain. EXAM: PORTABLE CHEST 1 VIEW COMPARISON:  05/28/2016 FINDINGS: The cardiomediastinal silhouette is within normal limits. The lungs are well inflated and clear. There is no evidence of pleural effusion or pneumothorax. No acute osseous abnormality is identified. IMPRESSION: No active disease. Electronically Signed   By: Logan Bores M.D.   On: 06/18/2018 15:01    Pending Labs Unresulted Labs (From admission, onward)    Start     Ordered   06/18/18 1636  D-dimer, quantitative (not at [facility])  Add-on,   R     06/18/18 1635   06/18/18 1621  Sodium, urine, random  Once,   R     06/18/18 1620   06/18/18 1621  Creatinine, urine, random  STAT,   STAT     06/18/18 1620   06/18/18 1545  Cortisol  Add-on,   R     06/18/18 1544   06/18/18 1544  TSH  Add-on,   STAT     06/18/18 1544          Vitals/Pain Today's Vitals   06/18/18 1500 06/18/18 1515 06/18/18 1530 06/18/18 1546  BP: 91/63 92/68 99/68 107/62  Pulse: 79 76 77 76  Resp: _0 (!) 24  Temp:      TempSrc:      SpO2: 98% 100% 100% 100%  Weight:  Height:      PainSc:        Isolation Precautions No active  isolations  Medications Medications  sodium chloride 0.9 % bolus 1,000 mL (0 mLs Intravenous Stopped 06/18/18 1524)  sodium chloride 0.9 % bolus 1,000 mL (0 mLs Intravenous Stopped 06/18/18 1555)    Mobility walks with person assist

## 2018-06-18 NOTE — ED Provider Notes (Signed)
[facility]-EMERGENCY DEPT Provider Note   CSN: [id number] Arrival date & time: 06/18/18  1357     History   Chief Complaint Chief Complaint  Patient presents with  . Hypotension  . Chest Pain    HPI Gabriel Garcia is a 42 y.o. male.  He has a history of hypertension and usually has blood pressure is 140/90.  He is been complaining about 3 weeks of exertional chest pain and fatigue.  States he usually does a very physical job and lately he can work for about 5 minutes without feeling exhausted and feeling his heart beating very hard causing some discomfort.  He had a wasp sting to his abdomen today.  He was told that if he does not feel well after this that he should come get checked.  Here in triage his blood pressure was in the 80s.  He was immediately brought back into the recess room where IV was established.  He states he does not feel any different since the last evening but relates that once been going on over the past 3 weeks.  Currently he has 0 out of 10 chest pain.  No cough no fevers no chills no urinary symptoms.  He is a prior history of some GI bleeding but he has noticed no vomiting no hematemesis no melena.  The history is provided by the patient.  Chest Pain   This is a new problem. Episode onset: 3 weeks. The problem occurs daily. The problem has been resolved. The pain is associated with exertion. The pain is present in the substernal region. The pain is moderate. The quality of the pain is described as pressure-like. The pain does not radiate. Associated symptoms include dizziness, exertional chest pressure, malaise/fatigue, near-syncope, numbness (left arm at times) and weakness (generalized). Pertinent negatives include no abdominal pain, no back pain, no cough, no fever, no headaches, no hemoptysis, no leg pain, no palpitations, no sputum production, no syncope and no vomiting. He has tried rest for the symptoms. The treatment provided moderate  relief.  His past medical history is significant for hypertension.  Pertinent negatives for past medical history include no seizures.    Past Medical History:  Diagnosis Date  . Acute duodenal ulcer with hemorrhage 10/12/2014  . GERD (gastroesophageal reflux disease)   . GI bleed   . Hepatitis C   . Hypertension   . 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 Legally blind in right eye, as defined in [place]   . Mesenteric adenitis   . Sliding hiatal hernia   . Substance abuse (HCC)    cocaine  . 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 Mallory-Weiss tear 12/03/2016  . BPPV (benign paroxysmal positional vertigo) 09/12/2016  . Genital herpes 09/12/2016  . OSA (obstructive sleep apnea) 05/28/2016  . Obese 11/17/2015  . Cocaine abuse (HCC) 11/16/2015  . Alcoholism (HCC) 11/16/2015  . Gout 11/16/2015  . Legally blind in right eye, as defined in [place]   . Acute duodenal ulcer with hemorrhage 10/12/2014  . EKG abnormality 10/12/2014  . Hypertension 10/06/2014  . Esophageal reflux 11/24/2013  . Hepatitis C     Past Surgical History:  Procedure Laterality Date  . ESOPHAGOGASTRODUODENOSCOPY N/A 10/13/2014   Procedure: ESOPHAGOGASTRODUODENOSCOPY (EGD);  Surgeon: Iva Boop, MD;  Location: [facility];  Service: Endoscopy;  Laterality: N/A;  . ESOPHAGOGASTRODUODENOSCOPY N/A 11/17/2016   Procedure: ESOPHAGOGASTRODUODENOSCOPY (  EGD);  Surgeon: Charlott Rakes, MD;  Location: [facility] ENDOSCOPY;  Service: Endoscopy;  Laterality: N/A;  . EYE SURGERY Right   . FOOT SURGERY Right   . LIVER BIOPSY     Dr. Kinnie Scales GI        Home Medications    Prior to Admission medications   Medication Sig Start Date End Date Taking? Authorizing Provider  lisinopril-hydrochlorothiazide (PRINZIDE,ZESTORETIC) 20-12.5 MG tablet Take 1 tablet by mouth daily. 04/09/18   [name], MD  pantoprazole (PROTONIX) 40 MG tablet Take 1 tablet (40 mg  total) by mouth 2 (two) times daily. Can switch to any approved PPI 11/20/16   Leroy Sea, MD  silver sulfADIAZINE (SILVADENE) 1 % cream Apply 1 application topically daily. 03/14/17   Fawze, Mina A, PA-C  valACYclovir (VALTREX) 500 MG tablet Take 1 tablet (500 mg total) by mouth 2 (two) times daily. For 3 days with outbreaks 04/09/18   [name], MD    Family History Family History  Problem Relation Age of Onset  . Rheum arthritis Mother   . Hypertension Mother   . Alcoholism Mother   . Heart failure Father        rheumatic heart disease, also drinking and smoking  . Alcoholism Father        died age 56  . Colon cancer Neg Hx     Social History Social History   Tobacco Use  . Smoking status: Never Smoker  . Smokeless tobacco: Never Used  Substance Use Topics  . Alcohol use: No    Comment: etoh free since 08/2015.  . Drug use: No    Comment: used cocaine before but has been clean since 08/2015.      Allergies   Patient has no known allergies.   Review of Systems Review of Systems  Constitutional: Positive for malaise/fatigue. Negative for chills and fever.  HENT: Negative for ear pain and sore throat.   Eyes: Positive for visual disturbance (blind right eye - old). Negative for pain.  Respiratory: Negative for cough, hemoptysis and sputum production.   Cardiovascular: Positive for chest pain and near-syncope. Negative for palpitations and syncope.  Gastrointestinal: Negative for abdominal pain and vomiting.  Genitourinary: Negative for dysuria and hematuria.  Musculoskeletal: Negative for arthralgias and back pain.  Skin: Negative for color change and rash.  Neurological: Positive for dizziness, weakness (generalized) and numbness (left arm at times). Negative for seizures, syncope and headaches.  All other systems reviewed and are negative.    Physical Exam Updated Vital Signs BP (!) 83/62 (BP Location: Right Arm)   Pulse (!) 105   Temp 98.2 F (36.8  C) (Oral)   Resp 17   Ht 5\' 10"  (1.778 m)   Wt 93 kg   SpO2 98%   BMI 29.41 kg/m   Physical Exam  Constitutional: He appears well-developed and well-nourished.  HENT:  Head: Normocephalic and atraumatic.  Eyes: Conjunctivae are normal.  Neck: Neck supple.  Cardiovascular: Normal rate, regular rhythm and normal pulses.  No murmur heard. Pulmonary/Chest: Effort normal and breath sounds normal. No respiratory distress.  Abdominal: Soft. There is no tenderness.  The proximally 1 cm erythematous area on his right lower quadrant where he got stung.  Musculoskeletal: Normal range of motion. He exhibits no edema.       Right lower leg: He exhibits no tenderness and no edema.       Left lower leg: He exhibits no tenderness and no edema.  Neurological:  He is alert.  Skin: Skin is warm and dry.  Psychiatric: He has a normal mood and affect.  Nursing note and vitals reviewed.    ED Treatments / Results  Labs (all labs ordered are listed, but only abnormal results are displayed) Labs Reviewed  BASIC METABOLIC PANEL - Abnormal; Notable for the following components:      Result Value   Glucose, Bld 112 (*)    Creatinine, Ser 2.61 (*)    GFR calc non Af Amer 29 (*)    GFR calc Af Amer 33 (*)    All other components within normal limits  CBC  PROTIME-INR  APTT  TSH  SODIUM, URINE, RANDOM  CREATININE, URINE, RANDOM  D-DIMER, QUANTITATIVE (NOT AT [facility])  CORTISOL  TROPONIN I  TROPONIN I  TROPONIN I  HIV ANTIBODY (ROUTINE TESTING)  BRAIN NATRIURETIC PEPTIDE  HEMOGLOBIN A1C  URINALYSIS, ROUTINE W REFLEX MICROSCOPIC  COMPREHENSIVE METABOLIC PANEL  CBC WITH DIFFERENTIAL/PLATELET  RAPID URINE DRUG SCREEN, HOSP PERFORMED  LIPID PANEL  I-STAT TROPONIN, ED  I-STAT CG4 LACTIC ACID, ED  POCT I-STAT TROPONIN I  CG4 I-STAT (LACTIC ACID)  TYPE AND SCREEN    EKG EKG Interpretation  Date/Time:  Thursday June 18 2018 14:04:06 EDT Ventricular Rate:  108 PR Interval:    QRS  Duration: 90 QT Interval:  343 QTC Calculation: 460 R Axis:   73 Text Interpretation:  Sinus tachycardia ST elevation 3,F but no recip changes.  compared with prior 2/18 Confirmed by Meridee Score [phone]) on 06/18/2018 2:34:06 PM   Radiology Dg Chest Port 1 View  Result Date: 06/18/2018 CLINICAL DATA:  Chest pain. EXAM: PORTABLE CHEST 1 VIEW COMPARISON:  05/28/2016 FINDINGS: The cardiomediastinal silhouette is within normal limits. The lungs are well inflated and clear. There is no evidence of pleural effusion or pneumothorax. No acute osseous abnormality is identified. IMPRESSION: No active disease. Electronically Signed   By: Sebastian Ache M.D.   On: 06/18/2018 15:01    Procedures .Critical Care Performed by: Terrilee Files, MD Authorized by: Terrilee Files, MD   Critical care provider statement:    Critical care time (minutes):  45   Critical care was necessary to treat or prevent imminent or life-threatening deterioration of the following conditions:  Circulatory failure and dehydration   Critical care was time spent personally by me on the following activities:  Discussions with consultants, evaluation of patient's response to treatment, examination of patient, ordering and performing treatments and interventions, ordering and review of laboratory studies, ordering and review of radiographic studies, pulse oximetry, re-evaluation of patient's condition, obtaining history from patient or surrogate, review of old charts and development of treatment plan with patient or surrogate   I assumed direction of critical care for this patient from another provider in my specialty: no     (including critical care time)   EMERGENCY DEPARTMENT [place] CARDIAC EXAM "Study: Limited Ultrasound of the Heart and Pericardium"  INDICATIONS:Abnormal vital signs Multiple views of the heart and pericardium were obtained in real-time with a multi-frequency probe.  PERFORMED FM:MCRFVO IMAGES ARCHIVED?:  Yes LIMITATIONS:  Body habitus VIEWS USED: Subcostal 4 chamber, Parasternal long axis and Parasternal short axis INTERPRETATION: Cardiac activity present and Pericardial effusioin absent  Medications Ordered in ED Medications  heparin injection 5,000 Units (has no administration in time range)  sodium chloride flush (NS) 0.9 % injection 3 mL (has no administration in time range)  0.9 %  sodium chloride infusion (has no administration in time  range)  sodium chloride flush (NS) 0.9 % injection 3 mL (has no administration in time range)  sodium chloride flush (NS) 0.9 % injection 3 mL (has no administration in time range)  0.9 %  sodium chloride infusion (has no administration in time range)  acetaminophen (TYLENOL) tablet 650 mg (has no administration in time range)    Or  acetaminophen (TYLENOL) suppository 650 mg (has no administration in time range)  traMADol (ULTRAM) tablet 50 mg (has no administration in time range)  polyethylene glycol (MIRALAX / GLYCOLAX) packet 17 g (has no administration in time range)  ondansetron (ZOFRAN) tablet 4 mg (has no administration in time range)    Or  ondansetron (ZOFRAN) injection 4 mg (has no administration in time range)  aspirin EC tablet 81 mg (has no administration in time range)  ipratropium (ATROVENT) nebulizer solution 0.5 mg (has no administration in time range)  albuterol (PROVENTIL) (2.5 MG/3ML) 0.083% nebulizer solution 2.5 mg (has no administration in time range)  sodium chloride 0.9 % bolus 1,000 mL (0 mLs Intravenous Stopped 06/18/18 1524)  sodium chloride 0.9 % bolus 1,000 mL (0 mLs Intravenous Stopped 06/18/18 1555)     Initial Impression / Assessment and Plan / ED Course  I have reviewed the triage vital signs and the nursing notes.  Pertinent labs & imaging results that were available during my care of the patient were reviewed by me and considered in my medical decision making (see chart for details).  Clinical Course as of Jun 18 1921  Thu Jun 18, 2018  1442 Since initial troponin back at 0.03.  His initial EKG was suspicious for some elevation but there was no depression so I think active ischemia is less likely currently.  Differential includes cardiac, anaphylaxis, anemia, sepsis. I think anaphylaxis is less likely because he was having any symptoms preceding the wasp sting that he had this afternoon and he does not feel particularly different.  There is also minimal local reaction and he is having no hives or wheezing.   [MB]  1539 Did a bedside cardiac ultrasound that did not show any significant pericardial effusion and he had good cardiac contractility.   [MB]  1539 After liter of saline, blood pressure up to the 90s.  He remains essentially asymptomatic and is  chatting with family   [MB]  1602 2 L of fluid patient's blood pressure now up to 107.   [MB]  1614 Patient's creatinine quite elevated at 2.6.  His potassium is normal.  Strangely to think his dehydration is making him so hypotensive but this is the only etiology found so far.  The kidneys to be admitted to the hospital for this elevated creatinine to be hydrated and have further work-up of his chest pain.   [MB]    Clinical Course User Index [MB] Terrilee Files, MD      Final Clinical Impressions(s) / ED Diagnoses   Final diagnoses:  Hypotension, unspecified hypotension type  AKI (acute kidney injury) [facility])  Atypical chest pain    ED Discharge Orders    None       Terrilee Files, MD 06/18/18 [phone]

## 2018-06-18 NOTE — H&P (Signed)
History and Physical  Gabriel Garcia ZOX:[id number] DOB: 12-12-1975 DOA: 06/18/2018  PCP: [name], MD  Patient coming from: Home & is able to ambulate independently  Chief Complaint: SOB  HPI: Gabriel Garcia is a 42 y.o. male with medical history significant for hypertension, OSA, hep C (completed treatment), history of duodenal ulcer, cocaine abuse, presents to the ED complaining of dyspnea on exertion, with intermittent chest tightness ongoing for the past 2 weeks.  Reports dyspnea is mainly on exertion, feels out of breath at work or even doing minimal activities.  Reports intermittent chest tightness, left-sided, nonradiating associated with dyspnea.  Today, patient was stung by a wasp on his abdomen and was advised to come to the ED.  No rashes noted or any increased work of breathing. Patient denies any long distant travel, recent surgery, calf tenderness/swelling, denies any abdominal pain, nausea/vomiting, fever/chills.  Of note, patient quit using cocaine for about 8 months but relapsed and used cocaine about 6 days ago.  Denies any other illicit drugs, quit alcohol about a year ago.  Does not use any tobacco products.  ED Course: Patient was noted to be hypotensive on presentation, took his blood pressure meds this morning prior to arrival.  Responded to IV fluids.  Lactic acid 1.34, i-STAT troponin 0 0.03, EKG showed some questionable ST elevation, but no reciprocal changes.  Patient noted to be in AKI with a creatinine of 2.61.  Patient admitted for further management  Review of Systems: Review of systems are otherwise negative   Past Medical History:  Diagnosis Date  . Acute duodenal ulcer with hemorrhage 10/12/2014  . GERD (gastroesophageal reflux disease)   . GI bleed   . Hepatitis C   . Hypertension   . 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 Legally blind in right eye, as defined in [place]   . Mesenteric adenitis   . Sliding hiatal hernia    . Substance abuse (HCC)    cocaine  . 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 ESOPHAGOGASTRODUODENOSCOPY N/A 10/13/2014   Procedure: ESOPHAGOGASTRODUODENOSCOPY (EGD);  Surgeon: Iva Boop, MD;  Location: [facility];  Service: Endoscopy;  Laterality: N/A;  . ESOPHAGOGASTRODUODENOSCOPY N/A 11/17/2016   Procedure: ESOPHAGOGASTRODUODENOSCOPY (EGD);  Surgeon: Charlott Rakes, MD;  Location: [facility] ENDOSCOPY;  Service: Endoscopy;  Laterality: N/A;  . EYE SURGERY Right   . FOOT SURGERY Right   . LIVER BIOPSY     Dr. Kinnie Scales GI    Social History:  reports that he has never smoked. He has never used smokeless tobacco. He reports that he does not drink alcohol or use drugs.   No Known Allergies  Family History  Problem Relation Age of Onset  . Rheum arthritis Mother   . Hypertension Mother   . Alcoholism Mother   . Heart failure Father        rheumatic heart disease, also drinking and smoking  . Alcoholism Father        died age 40  . Colon cancer Neg Hx       Prior to Admission medications   Medication Sig Start Date End Date Taking? Authorizing Provider  Aspirin-Acetaminophen-Caffeine (GOODY HEADACHE PO) Take 1 packet by mouth every 4 (four) hours as needed (pain).   Yes [provider]  cetirizine (ZYRTEC) 10 MG tablet Take 10 mg by mouth daily.   Yes [provider]  diphenhydrAMINE-APAP, sleep, (TYLENOL PM EXTRA  STRENGTH PO) Take 1 tablet by mouth at bedtime as needed (sleep and pain).   Yes [provider]  lisinopril-hydrochlorothiazide (PRINZIDE,ZESTORETIC) 20-12.5 MG tablet Take 1 tablet by mouth daily. 04/09/18  Yes [name], MD  valACYclovir (VALTREX) 500 MG tablet Take 1 tablet (500 mg total) by mouth 2 (two) times daily. For 3 days with outbreaks 04/09/18  Yes [name], MD  pantoprazole (PROTONIX) 40 MG tablet Take 1 tablet (40 mg total) by mouth 2 (two) times daily. Can switch to any  approved PPI Patient not taking: Reported on 06/18/2018 11/20/16   Leroy Sea, MD  silver sulfADIAZINE (SILVADENE) 1 % cream Apply 1 application topically daily. Patient not taking: Reported on 06/18/2018 03/14/17   Jeanie Sewer, PA-C    Physical Exam: BP 116/73 (BP Location: Left Arm)   Pulse 71   Temp 98 F (36.7 C) (Oral)   Resp 19   Ht 5\' 10"  (1.778 m)   Wt 93 kg   SpO2 100%   BMI 29.41 kg/m   General: NAD Eyes: Legally blind in right eye ENT: Normal Neck: Supple Cardiovascular: S1, S2 present Respiratory: CTA B Abdomen: Soft, nontender, nondistended, bowel sounds present Skin: Normal Musculoskeletal: No pedal edema bilaterally Psychiatric: Normal mood Neurologic: No focal neurologic deficits noted          Labs on Admission:  Basic Metabolic Panel: Recent Labs  Lab 06/18/18 1412  NA 143  K 4.4  CL 106  CO2 26  GLUCOSE 112*  BUN 18  CREATININE 2.61*  CALCIUM 9.9   Liver Function Tests: No results for input(s): AST, ALT, ALKPHOS, BILITOT, PROT, ALBUMIN in the last 168 hours. No results for input(s): LIPASE, AMYLASE in the last 168 hours. No results for input(s): AMMONIA in the last 168 hours. CBC: Recent Labs  Lab 06/18/18 1412  WBC 10.0  HGB 16.7  HCT 49.2  MCV 90.9  PLT 350   Cardiac Enzymes: No results for input(s): CKTOTAL, CKMB, CKMBINDEX, TROPONINI in the last 168 hours.  BNP (last 3 results) No results for input(s): BNP in the last 8760 hours.  ProBNP (last 3 results) No results for input(s): PROBNP in the last 8760 hours.  CBG: No results for input(s): GLUCAP in the last 168 hours.  Radiological Exams on Admission: Dg Chest Port 1 View  Result Date: 06/18/2018 CLINICAL DATA:  Chest pain. EXAM: PORTABLE CHEST 1 VIEW COMPARISON:  05/28/2016 FINDINGS: The cardiomediastinal silhouette is within normal limits. The lungs are well inflated and clear. There is no evidence of pleural effusion or pneumothorax. No acute osseous abnormality is  identified. IMPRESSION: No active disease. Electronically Signed   By: Sebastian Ache M.D.   On: 06/18/2018 15:01    EKG: Independently reviewed as mentioned above & below  Assessment/Plan Present on Admission: . Chest pain . Esophageal reflux . Hypertension . Cocaine abuse (HCC) . Alcoholism (HCC) . OSA (obstructive sleep apnea)  Principal Problem:   Chest pain Active Problems:   Esophageal reflux   Hypertension   Cocaine abuse (HCC)   Alcoholism (HCC)   OSA (obstructive sleep apnea)  Exertional dyspnea/chest pain Patient complains more of dyspnea on exertion with intermittent chest tightness No significant cardiac history, history of hypertension, cocaine abuse ED Trop 0.03, EKG showed questionable ST elevation without any reciprocal change, will repeat stat Cycle troponin x 3 D-dimer negative Chest x-ray unremarkable BNP pending Echo pending May consult cardiology in a.m. Telemetry, monitor closely  AKI Baseline creatinine WNL,  2.61 on admission UA, urine lites, pending Renal ultrasound pending Continue IV fluids Hold lisinopril/HCTZ Daily BMP  Hypotension with a history of hypertension SBP in the 80s on presentation Lactic acid within normal limits Improved status post IV fluids Hold lisinopril/HCTZ Continue IV fluids  Cocaine abuse Last use about 6 days ago UDS pending Advised to quit  OSA CPAP     DVT prophylaxis: Heparin  Code Status: Full  Family Communication: None at bedside  Disposition Plan: Plan for DC once work-up complete  Consults called: None  Admission status: Observation    Briant Cedar MD [facility] Pager [phone]  If 7PM-7AM, please contact night-coverage [email or web address] Password [facility]  06/18/2018, 6:19 PM

## 2018-06-19 ENCOUNTER — Observation Stay (HOSPITAL_BASED_OUTPATIENT_CLINIC_OR_DEPARTMENT_OTHER): Payer: 59

## 2018-06-19 DIAGNOSIS — I959 Hypotension, unspecified: Secondary | ICD-10-CM | POA: Diagnosis not present

## 2018-06-19 DIAGNOSIS — I340 Nonrheumatic mitral (valve) insufficiency: Secondary | ICD-10-CM | POA: Diagnosis not present

## 2018-06-19 DIAGNOSIS — F1410 Cocaine abuse, uncomplicated: Secondary | ICD-10-CM | POA: Diagnosis not present

## 2018-06-19 DIAGNOSIS — R079 Chest pain, unspecified: Secondary | ICD-10-CM | POA: Diagnosis not present

## 2018-06-19 DIAGNOSIS — I10 Essential (primary) hypertension: Secondary | ICD-10-CM | POA: Diagnosis not present

## 2018-06-19 DIAGNOSIS — G4733 Obstructive sleep apnea (adult) (pediatric): Secondary | ICD-10-CM | POA: Diagnosis not present

## 2018-06-19 LAB — CBC WITH DIFFERENTIAL/PLATELET
BASOS PCT: 0 %
Basophils Absolute: 0 10*3/uL (ref 0.0–0.1)
EOS ABS: 0.1 10*3/uL (ref 0.0–0.7)
Eosinophils Relative: 2 %
HEMATOCRIT: 40.4 % (ref 39.0–52.0)
HEMOGLOBIN: 13.3 g/dL (ref 13.0–17.0)
LYMPHS ABS: 3.3 10*3/uL (ref 0.7–4.0)
Lymphocytes Relative: 45 %
MCH: 30.3 pg (ref 26.0–34.0)
MCHC: 32.9 g/dL (ref 30.0–36.0)
MCV: 92 fL (ref 78.0–100.0)
MONO ABS: 0.6 10*3/uL (ref 0.1–1.0)
MONOS PCT: 8 %
Neutro Abs: 3.3 10*3/uL (ref 1.7–7.7)
Neutrophils Relative %: 45 %
Platelets: 249 10*3/uL (ref 150–400)
RBC: 4.39 MIL/uL (ref 4.22–5.81)
RDW: 14.3 % (ref 11.5–15.5)
WBC: 7.4 10*3/uL (ref 4.0–10.5)

## 2018-06-19 LAB — COMPREHENSIVE METABOLIC PANEL
ALBUMIN: 3.5 g/dL (ref 3.5–5.0)
ALK PHOS: 44 U/L (ref 38–126)
ALT: 12 U/L (ref 0–44)
AST: 13 U/L — AB (ref 15–41)
Anion gap: 8 (ref 5–15)
BUN: 22 mg/dL — ABNORMAL HIGH (ref 6–20)
CALCIUM: 8.9 mg/dL (ref 8.9–10.3)
CHLORIDE: 109 mmol/L (ref 98–111)
CO2: 26 mmol/L (ref 22–32)
CREATININE: 1.85 mg/dL — AB (ref 0.61–1.24)
GFR calc Af Amer: 51 mL/min — ABNORMAL LOW (ref >60)
GFR calc non Af Amer: 44 mL/min — ABNORMAL LOW (ref >60)
GLUCOSE: 110 mg/dL — AB (ref 70–99)
Potassium: 4.1 mmol/L (ref 3.5–5.1)
SODIUM: 143 mmol/L (ref 135–145)
Total Bilirubin: 0.3 mg/dL (ref 0.3–1.2)
Total Protein: 6.3 g/dL — ABNORMAL LOW (ref 6.5–8.1)

## 2018-06-19 LAB — LIPID PANEL
CHOLESTEROL: 144 mg/dL (ref 0–200)
HDL: 31 mg/dL — ABNORMAL LOW (ref >40)
LDL CALC: 91 mg/dL (ref 0–99)
TRIGLYCERIDES: 108 mg/dL (ref <150)
Total CHOL/HDL Ratio: 4.6 RATIO
VLDL: 22 mg/dL (ref 0–40)

## 2018-06-19 LAB — HIV ANTIBODY (ROUTINE TESTING W REFLEX): HIV Screen 4th Generation wRfx: NONREACTIVE

## 2018-06-19 LAB — ECHOCARDIOGRAM COMPLETE
Height: 70 in
Weight: 3280 oz

## 2018-06-19 LAB — TROPONIN I
Troponin I: <0.03 ng/mL (ref <0.03)
Troponin I: <0.03 ng/mL (ref <0.03)

## 2018-06-19 MED ORDER — POLYVINYL ALCOHOL 1.4 % OP SOLN
1.0000 [drp] | OPHTHALMIC | Status: DC | PRN
  Administered 2018-06-19: 1 [drp] via OPHTHALMIC
  Filled 2018-06-19: qty 15

## 2018-06-19 NOTE — Progress Notes (Signed)
  Echocardiogram 2D Echocardiogram has been performed.  Gabriel Garcia L Androw 06/19/2018, 9:19 AM

## 2018-06-19 NOTE — Progress Notes (Signed)
PROGRESS NOTE  Gabriel Garcia UJW:[id number] DOB: 12-28-1975 DOA: 06/18/2018 PCP: [name], MD  HPI/Recap of past 24 hours: Gabriel Garcia is a 42 y.o. male with medical history significant for hypertension, OSA, hep C (completed treatment), history of duodenal ulcer, cocaine abuse, presents to the ED complaining of dyspnea on exertion, with intermittent chest tightness ongoing for the past 2 weeks.  Reports dyspnea is mainly on exertion, feels out of breath at work or even doing minimal activities.  Reports intermittent chest tightness, left-sided, nonradiating associated with dyspnea. Patient was stung by a wasp on his abdomen and was advised to come to the ED.  No rashes noted or any increased work of breathing. Patient denies any long distant travel, recent surgery, calf tenderness/swelling, denies any abdominal pain, nausea/vomiting, fever/chills.  Of note, patient quit using cocaine for about 8 months but relapsed and used cocaine about 6 days ago.  Denies any other illicit drugs, quit alcohol about a year ago.  Does not use any tobacco products. In the ED, patient was noted to be hypotensive on presentation, took his blood pressure meds this morning prior to arrival.  Responded to IV fluids. Patient noted to be in AKI with a creatinine of 2.61.  Patient admitted for further management.   Today, pt reported feeling better, denies any chest pain, SOB, fever/chills, abdominal pain  Assessment/Plan: Principal Problem:   Chest pain Active Problems:   Esophageal reflux   Hypertension   Cocaine abuse (HCC)   Alcoholism (HCC)   OSA (obstructive sleep apnea)  Exertional dyspnea/non-cardiac chest pain ?anxiety Patient complains more of dyspnea on exertion with intermittent chest tightness No significant cardiac history, history of hypertension, cocaine abuse ED Trop 0.03, repeat EKG NSR Cycle troponin x 3- <0.03 D-dimer negative Chest x-ray unremarkable BNP 16.8 ECHO  showed normal EF Telemetry, monitor closely  AKI Improving Baseline creatinine WNL, 2.61 on admission UA unremarkable Renal ultrasound unremarkable Continue IV fluids Hold lisinopril/HCTZ Daily BMP  Hypotension with a history of hypertension Improved SBP in the 80s on presentation Lactic acid within normal limits Improved status post IV fluids Hold lisinopril/HCTZ Continue IV fluids  Cocaine abuse Last use about 6 days ago UDS positive for cocaine Advised to quit  OSA CPAP   Code Status: Full   Family Communication: Fiance at bedside  Disposition Plan: Home by 06/20/18   Consultants:  None  Procedures:  None  Antimicrobials:  None  DVT prophylaxis: Heparin   Objective: Vitals:   06/18/18 1757 06/18/18 2027 06/19/18 0552 06/19/18 1330  BP: 116/73 128/73 116/78 (!) 139/94  Pulse: 71 76 81 84  Resp: 19 20 16  (!) 26  Temp: 98 F (36.7 C) 98 F (36.7 C) 97.6 F (36.4 C) 98.5 F (36.9 C)  TempSrc: Oral Oral Oral Oral  SpO2: 100% 98% 100% 100%  Weight:      Height:        Intake/Output Summary (Last 24 hours) at 06/19/2018 1444 Last data filed at 06/19/2018 1439 Gross per 24 hour  Intake 446.28 ml  Output 2620 ml  Net -2173.72 ml   Filed Weights   06/18/18 1405  Weight: 93 kg    Exam:   General: NAD   Cardiovascular: S1, S2 present  Respiratory: CTAB   Abdomen: Soft, NT, ND, BS present  Musculoskeletal: No pedal edema b/l  Skin: Normal  Psychiatry: Normal mood    Data Reviewed: CBC: Recent Labs  Lab 06/18/18 1412 06/19/18 0144  WBC 10.0 7.4  NEUTROABS  --  3.3  HGB 16.7 13.3  HCT 49.2 40.4  MCV 90.9 92.0  PLT 350 249   Basic Metabolic Panel: Recent Labs  Lab 06/18/18 1412 06/19/18 0144  NA 143 143  K 4.4 4.1  CL 106 109  CO2 26 26  GLUCOSE 112* 110*  BUN 18 22*  CREATININE 2.61* 1.85*  CALCIUM 9.9 8.9   GFR: Estimated Creatinine Clearance: 60.2 mL/min (A) (by C-G formula based on SCr of 1.85 mg/dL  (H)). Liver Function Tests: Recent Labs  Lab 06/19/18 0144  AST 13*  ALT 12  ALKPHOS 44  BILITOT 0.3  PROT 6.3*  ALBUMIN 3.5   No results for input(s): LIPASE, AMYLASE in the last 168 hours. No results for input(s): AMMONIA in the last 168 hours. Coagulation Profile: Recent Labs  Lab 06/18/18 1429  INR 0.89   Cardiac Enzymes: Recent Labs  Lab 06/18/18 1926 06/19/18 0144 06/19/18 0709  TROPONINI <0.03 <0.03 <0.03   BNP (last 3 results) No results for input(s): PROBNP in the last 8760 hours. HbA1C: Recent Labs    06/18/18 1926  HGBA1C 5.6   CBG: No results for input(s): GLUCAP in the last 168 hours. Lipid Profile: Recent Labs    06/19/18 0145  CHOL 144  HDL 31*  LDLCALC 91  TRIG 037  CHOLHDL 4.6   Thyroid Function Tests: Recent Labs    06/18/18 1600  TSH 1.408   Anemia Panel: No results for input(s): VITAMINB12, FOLATE, FERRITIN, TIBC, IRON, RETICCTPCT in the last 72 hours. Urine analysis:    Component Value Date/Time   COLORURINE YELLOW 06/18/2018 1719   APPEARANCEUR CLEAR 06/18/2018 1719   LABSPEC 1.012 06/18/2018 1719   PHURINE 5.0 06/18/2018 1719   GLUCOSEU NEGATIVE 06/18/2018 1719   HGBUR NEGATIVE 06/18/2018 1719   BILIRUBINUR NEGATIVE 06/18/2018 1719   BILIRUBINUR n 11/16/2015 1411   KETONESUR NEGATIVE 06/18/2018 1719   PROTEINUR NEGATIVE 06/18/2018 1719   UROBILINOGEN 1.0 11/16/2015 1411   UROBILINOGEN 0.2 07/06/2014 1630   NITRITE NEGATIVE 06/18/2018 1719   LEUKOCYTESUR TRACE (A) 06/18/2018 1719   Sepsis Labs: @LABRCNTIP (procalcitonin:4,lacticidven:4)  )No results found for this or any previous visit (from the past 240 hour(s)).    Studies: US Renal  Result Date: 06/18/2018 CLINICAL DATA:  42 year old male with acute kidney injury EXAM: RENAL / URINARY TRACT ULTRASOUND COMPLETE COMPARISON:  None. FINDINGS: Right Kidney: Length: 11.2 cm. Echogenicity within normal limits. No mass or hydronephrosis visualized. Left Kidney: Length:  11.4 cm. Echogenicity within normal limits. No mass or hydronephrosis visualized. Bladder: Appears normal for degree of bladder distention. IMPRESSION: Normal sonographic appearance of the kidneys. No evidence of hydronephrosis. Electronically Signed   By: Malachy Moan M.D.   On: 06/18/2018 19:22    Scheduled Meds: . aspirin EC  81 mg Oral Daily  . heparin  5,000 Units Subcutaneous Q8H  . sodium chloride flush  3 mL Intravenous Q12H  . sodium chloride flush  3 mL Intravenous Q12H    Continuous Infusions: . sodium chloride 100 mL/hr at 06/19/18 0700  . sodium chloride       LOS: 0 days     Briant Cedar, MD [facility]   If 7PM-7AM, please contact night-coverage [email or web address] Password [email or web address] 06/19/2018, 2:44 PM

## 2018-06-20 DIAGNOSIS — N179 Acute kidney failure, unspecified: Secondary | ICD-10-CM

## 2018-06-20 DIAGNOSIS — F1410 Cocaine abuse, uncomplicated: Secondary | ICD-10-CM

## 2018-06-20 DIAGNOSIS — F1020 Alcohol dependence, uncomplicated: Secondary | ICD-10-CM

## 2018-06-20 DIAGNOSIS — R079 Chest pain, unspecified: Secondary | ICD-10-CM

## 2018-06-20 DIAGNOSIS — R0789 Other chest pain: Secondary | ICD-10-CM

## 2018-06-20 LAB — BASIC METABOLIC PANEL
Anion gap: 5 (ref 5–15)
BUN: 15 mg/dL (ref 6–20)
CO2: 29 mmol/L (ref 22–32)
Calcium: 8.8 mg/dL — ABNORMAL LOW (ref 8.9–10.3)
Chloride: 109 mmol/L (ref 98–111)
Creatinine, Ser: 1.25 mg/dL — ABNORMAL HIGH (ref 0.61–1.24)
GFR calc non Af Amer: >60 mL/min (ref >60)
Glucose, Bld: 103 mg/dL — ABNORMAL HIGH (ref 70–99)
POTASSIUM: 4 mmol/L (ref 3.5–5.1)
SODIUM: 143 mmol/L (ref 135–145)

## 2018-06-20 MED ORDER — AMLODIPINE BESYLATE 2.5 MG PO TABS: 2.5000 mg | ORAL_TABLET | Freq: Every day | ORAL | 0 refills | Status: DC

## 2018-06-20 MED ORDER — AMLODIPINE BESYLATE 5 MG PO TABS
2.5000 mg | ORAL_TABLET | Freq: Every day | ORAL | Status: DC
  Administered 2018-06-20: 2.5 mg via ORAL
  Filled 2018-06-20: qty 1

## 2018-06-20 NOTE — Discharge Summary (Signed)
Physician Discharge Summary  Gabriel Garcia WCH:[id number] DOB: 1975-11-18 DOA: 06/18/2018  PCP: Gabriel Majestic, MD  Admit date: 06/18/2018 Discharge date: 06/20/2018  Admitted From:[facility] Disposition: home Recommendations for Outpatient Follow-up:  1. Follow up with PCP in 1-2 weeks 2. Please obtain BMP/CBC in one week  Home Health none Equipment/Devices none  Discharge Condition stable CODE STATUS full Diet recommendation: cardiac Brief/Interim Summary:42 y.o.malewith medical history significant forhypertension, OSA, hep C(completed treatment),history of duodenal ulcer,cocaine abuse,presents to the ED complaining of dyspnea on exertion,with intermittent chest tightness ongoing for the past 2 weeks. Reports dyspnea is mainly on exertion, feels out of breath at work or even doing minimal activities. Reports intermittent chest tightness,left-sided, nonradiating associated with dyspnea. Patient was stung by a wasp on his abdomen and was advised to come to the ED. No rashes noted or any increased work of breathing. Patient denies any long distant travel, recent surgery, calf tenderness/swelling, denies any abdominal pain, nausea/vomiting, fever/chills. Of note, patient quit using cocaine for about 8 months but relapsed and used cocaine about 6 days ago. Denies any other illicit drugs, quit alcohol about a year ago. Does not use any tobacco products. In the ED, patient was noted to be hypotensive on presentation, took his blood pressure meds this morning prior to arrival. Responded to IV fluids.Patient noted to be in AKI with a creatinine of 2.61.Patient admitted for further management.   Discharge Diagnoses:  Principal Problem:   Chest pain Active Problems:   Esophageal reflux   Hypertension   Cocaine abuse (HCC)   Alcoholism (HCC)   OSA (obstructive sleep apnea)  Exertional dyspnea/non-cardiac chest pain ?anxiety Patient complains more of dyspnea on exertion with  intermittent chest tightness No significant cardiac history,HAS history of hypertension,HAS history of  cocaine abuse ED Trop 0.03, repeat EKG NSR Cycle troponin x 3- <0.03 D-dimer negative Chest x-ray unremarkable BNP 16.8 ECHO showed normal EF No FURTHER CHEST PAIN ANXIOUS TO GO HOME.  AKI Improved with ivf Baseline creatinine WNL, 2.61 on admission,discharge creatinine 1.25. UA unremarkable Renal ultrasound unremarkable Stopped lisinopril/HCTZ   Hypotension with a history of hypertension Improved SBP in the 80s on presentation Lactic acid within normal limits Improved status post IV fluids Stopped lisinopril/HCTZ bp trending up 140/91,start low dose norvasc.   Cocaine abuse Last use about 6 days ago UDS positive for cocaine Advised to quit  OSA CPAP Discharge Instructions  Discharge Instructions    Call MD for:  difficulty breathing, headache or visual disturbances   Complete by:  As directed    Call MD for:  persistant dizziness or light-headedness   Complete by:  As directed    Call MD for:  persistant nausea and vomiting   Complete by:  As directed    Call MD for:  severe uncontrolled pain   Complete by:  As directed    Diet - low sodium heart healthy   Complete by:  As directed    Increase activity slowly   Complete by:  As directed      Allergies as of 06/20/2018   No Known Allergies     Medication List    STOP taking these medications   GOODY HEADACHE PO   lisinopril-hydrochlorothiazide 20-12.5 MG tablet Commonly known as:  PRINZIDE,ZESTORETIC   pantoprazole 40 MG tablet Commonly known as:  PROTONIX   silver sulfADIAZINE 1 % cream Commonly known as:  SILVADENE   TYLENOL PM EXTRA STRENGTH PO     TAKE these medications   amLODipine 2.5  MG tablet Commonly known as:  NORVASC Take 1 tablet (2.5 mg total) by mouth daily. Start taking on:  06/21/2018   cetirizine 10 MG tablet Commonly known as:  ZYRTEC Take 10 mg by mouth daily.    valACYclovir 500 MG tablet Commonly known as:  VALTREX Take 1 tablet (500 mg total) by mouth 2 (two) times daily. For 3 days with outbreaks      Follow-up Information    Gabriel Majestic, MD Follow up.   Specialty:  Family Medicine Why:  lisinopril and hctz was stopped due to aki.Benay Pillow information: [address] [place] [address] [place] [address] [phone]          No Known Allergies  Consultations:  none   Procedures/Studies: US Renal  Result Date: 06/18/2018 CLINICAL DATA:  42 year old male with acute kidney injury EXAM: RENAL / URINARY TRACT ULTRASOUND COMPLETE COMPARISON:  None. FINDINGS: Right Kidney: Length: 11.2 cm. Echogenicity within normal limits. No mass or hydronephrosis visualized. Left Kidney: Length: 11.4 cm. Echogenicity within normal limits. No mass or hydronephrosis visualized. Bladder: Appears normal for degree of bladder distention. IMPRESSION: Normal sonographic appearance of the kidneys. No evidence of hydronephrosis. Electronically Signed   By: Malachy Moan M.D.   On: 06/18/2018 19:22   Dg Chest Port 1 View  Result Date: 06/18/2018 CLINICAL DATA:  Chest pain. EXAM: PORTABLE CHEST 1 VIEW COMPARISON:  05/28/2016 FINDINGS: The cardiomediastinal silhouette is within normal limits. The lungs are well inflated and clear. There is no evidence of pleural effusion or pneumothorax. No acute osseous abnormality is identified. IMPRESSION: No active disease. Electronically Signed   By: Sebastian Ache M.D.   On: 06/18/2018 15:01    (Echo, Carotid, EGD, Colonoscopy, ERCP)    Subjective:   Discharge Exam: Vitals:   06/19/18 2245 06/20/18 0458  BP:  (!) 128/93  Pulse:  67  Resp: 18 12  Temp:  (!) 97.5 F (36.4 C)  SpO2:  99%   Vitals:   06/19/18 1330 06/19/18 2050 06/19/18 2245 06/20/18 0458  BP: (!) 139/94 (!) 140/91  (!) 128/93  Pulse: 84 73  67  Resp: (!) 26 20 18 12   Temp: 98.5 F (36.9 C) (!) 97.4 F (36.3 C)  (!) 97.5 F (36.4 C)   TempSrc: Oral Oral  Oral  SpO2: 100% 99%  99%  Weight:      Height:        General: Pt is alert, awake, not in acute distress Cardiovascular: RRR, S1/S2 +, no rubs, no gallops Respiratory: CTA bilaterally, no wheezing, no rhonchi Abdominal: Soft, NT, ND, bowel sounds + Extremities: no edema, no cyanosis    The results of significant diagnostics from this hospitalization (including imaging, microbiology, ancillary and laboratory) are listed below for reference.     Microbiology: No results found for this or any previous visit (from the past 240 hour(s)).   Labs: BNP (last 3 results) Recent Labs    06/18/18 1926  BNP 16.8   Basic Metabolic Panel: Recent Labs  Lab 06/18/18 1412 06/19/18 0144 06/20/18 0426  NA 143 143 143  K 4.4 4.1 4.0  CL 106 109 109  CO2 26 26 29   GLUCOSE 112* 110* 103*  BUN 18 22* 15  CREATININE 2.61* 1.85* 1.25*  CALCIUM 9.9 8.9 8.8*   Liver Function Tests: Recent Labs  Lab 06/19/18 0144  AST 13*  ALT 12  ALKPHOS 44  BILITOT 0.3  PROT 6.3*  ALBUMIN 3.5   No results for input(s): LIPASE,  AMYLASE in the last 168 hours. No results for input(s): AMMONIA in the last 168 hours. CBC: Recent Labs  Lab 06/18/18 1412 06/19/18 0144  WBC 10.0 7.4  NEUTROABS  --  3.3  HGB 16.7 13.3  HCT 49.2 40.4  MCV 90.9 92.0  PLT 350 249   Cardiac Enzymes: Recent Labs  Lab 06/18/18 1926 06/19/18 0144 06/19/18 0709  TROPONINI <0.03 <0.03 <0.03   BNP: Invalid input(s): POCBNP CBG: No results for input(s): GLUCAP in the last 168 hours. D-Dimer Recent Labs    06/18/18 1429  DDIMER <0.27   Hgb A1c Recent Labs    06/18/18 1926  HGBA1C 5.6   Lipid Profile Recent Labs    06/19/18 0145  CHOL 144  HDL 31*  LDLCALC 91  TRIG 161  CHOLHDL 4.6   Thyroid function studies Recent Labs    06/18/18 1600  TSH 1.408   Anemia work up No results for input(s): VITAMINB12, FOLATE, FERRITIN, TIBC, IRON, RETICCTPCT in the last 72  hours. Urinalysis    Component Value Date/Time   COLORURINE YELLOW 06/18/2018 1719   APPEARANCEUR CLEAR 06/18/2018 1719   LABSPEC 1.012 06/18/2018 1719   PHURINE 5.0 06/18/2018 1719   GLUCOSEU NEGATIVE 06/18/2018 1719   HGBUR NEGATIVE 06/18/2018 1719   BILIRUBINUR NEGATIVE 06/18/2018 1719   BILIRUBINUR n 11/16/2015 1411   KETONESUR NEGATIVE 06/18/2018 1719   PROTEINUR NEGATIVE 06/18/2018 1719   UROBILINOGEN 1.0 11/16/2015 1411   UROBILINOGEN 0.2 07/06/2014 1630   NITRITE NEGATIVE 06/18/2018 1719   LEUKOCYTESUR TRACE (A) 06/18/2018 1719   Sepsis Labs Invalid input(s): PROCALCITONIN,  WBC,  LACTICIDVEN Microbiology No results found for this or any previous visit (from the past 240 hour(s)).   Time coordinating discharge 35 minutes  SIGNED:   Alwyn Ren, MD  [facility] 06/20/2018, 10:08 AM Pager   If 7PM-7AM, please contact night-coverage [email or web address] Password [email or web address]

## 2018-06-22 ENCOUNTER — Telehealth: Payer: Self-pay | Admitting: *Deleted

## 2018-06-22 NOTE — Telephone Encounter (Signed)
Left voicemail requesting call back.  

## 2018-06-23 NOTE — Telephone Encounter (Signed)
Attempted to call patient. Call would not go through and unable to leave voicemail.

## 2018-06-29 NOTE — Telephone Encounter (Signed)
Attempted to call patient. Call unable to connect, unable to leave voicemail. MyChart is not set up either.

## 2018-07-03 NOTE — Telephone Encounter (Signed)
Attempted to call patient. Call would not go through. °

## 2018-08-03 ENCOUNTER — Emergency Department (HOSPITAL_COMMUNITY)
Admission: EM | Admit: 2018-08-03 | Discharge: 2018-08-03 | Disposition: A | Discharge status: Home / Self Care | Payer: 59 | Attending: Emergency Medicine | Admitting: Emergency Medicine

## 2018-08-03 ENCOUNTER — Other Ambulatory Visit: Payer: Self-pay

## 2018-08-03 ENCOUNTER — Encounter (HOSPITAL_COMMUNITY): Payer: Self-pay

## 2018-08-03 DIAGNOSIS — Z79899 Other long term (current) drug therapy: Secondary | ICD-10-CM | POA: Insufficient documentation

## 2018-08-03 DIAGNOSIS — M79674 Pain in right toe(s): Secondary | ICD-10-CM | POA: Diagnosis not present

## 2018-08-03 DIAGNOSIS — R030 Elevated blood-pressure reading, without diagnosis of hypertension: Secondary | ICD-10-CM

## 2018-08-03 DIAGNOSIS — S199XXA Unspecified injury of neck, initial encounter: Secondary | ICD-10-CM | POA: Diagnosis present

## 2018-08-03 DIAGNOSIS — Y998 Other external cause status: Secondary | ICD-10-CM | POA: Insufficient documentation

## 2018-08-03 DIAGNOSIS — S161XXA Strain of muscle, fascia and tendon at neck level, initial encounter: Secondary | ICD-10-CM | POA: Insufficient documentation

## 2018-08-03 DIAGNOSIS — Y9383 Activity, rough housing and horseplay: Secondary | ICD-10-CM | POA: Diagnosis not present

## 2018-08-03 DIAGNOSIS — X500XXA Overexertion from strenuous movement or load, initial encounter: Secondary | ICD-10-CM | POA: Insufficient documentation

## 2018-08-03 DIAGNOSIS — Y929 Unspecified place or not applicable: Secondary | ICD-10-CM | POA: Diagnosis not present

## 2018-08-03 DIAGNOSIS — I10 Essential (primary) hypertension: Secondary | ICD-10-CM | POA: Insufficient documentation

## 2018-08-03 MED ORDER — PREDNISONE 10 MG PO TABS: 40.0000 mg | ORAL_TABLET | Freq: Every day | ORAL | 0 refills | Status: AC

## 2018-08-03 MED ORDER — LIDOCAINE 5 % EX PTCH: 1.0000 | MEDICATED_PATCH | CUTANEOUS | 0 refills | Status: DC

## 2018-08-03 MED ORDER — LIDOCAINE 5 % EX PTCH
1.0000 | MEDICATED_PATCH | CUTANEOUS | Status: DC
  Administered 2018-08-03: 1 via TRANSDERMAL
  Filled 2018-08-03: qty 1

## 2018-08-03 MED ORDER — SUCRALFATE 1 G PO TABS: 1.0000 g | ORAL_TABLET | Freq: Three times a day (TID) | ORAL | 0 refills | Status: DC

## 2018-08-03 MED ORDER — METHOCARBAMOL 500 MG PO TABS: 500.0000 mg | ORAL_TABLET | Freq: Two times a day (BID) | ORAL | 0 refills | Status: DC

## 2018-08-03 NOTE — ED Triage Notes (Signed)
Patient reports that he was wrestling with his kid last night and is now having left lateral neck pain. Patient also c/o "gout" right great toe x 2 days.

## 2018-08-03 NOTE — Discharge Instructions (Addendum)
Please return to the Emergency Department for any new or worsening symptoms or if your symptoms do not improve. Please be sure to follow up with your Primary Care Physician as soon as possible regarding your visit today. If you do not have a Primary Doctor please use the resources below to establish one. You may use the Robaxin as prescribed to help with your muscle pain.  Please do not drive or operate heavy machinery while taking Robaxin because will make you sleepy. You may use the Carafate as prescribed to help prevent stomach pain while you take medicine. You may use the prednisone to help with your gout pain. You may use the Lidoderm patch to help with your muscle pain. Your blood pressure was elevated today, please follow-up with your primary care provider for blood pressure management and recheck as soon as possible.  Contact a health care provider if: Your muscle pain gets worse and medicines do not help. You have muscle pain that lasts longer than 3 days. You have a rash or fever along with muscle pain. You have muscle pain after a tick bite. You have muscle pain while working out, even though you are in good physical condition. You have redness, soreness, or swelling along with muscle pain. You have muscle pain after starting a new medicine or changing the dose of a medicine. Get help right away if: You have trouble breathing. You have trouble swallowing. You have muscle pain along with a stiff neck, fever, and vomiting. You have severe muscle weakness or cannot move part of your body. Contact a doctor if: You think you are having a reaction to the medicine you are taking. You have headaches that keep coming back (recurring). You feel dizzy. You have swelling in your ankles. You have trouble with your vision. Get help right away if: You get a very bad headache. You start to feel confused. You feel weak or numb. You feel faint. You get very bad pain in your: Chest. Belly  (abdomen). You throw up (vomit) more than once. You have trouble breathing. Contact a health care provider if: Your pain increases, and medicine does not help. Your joint pain does not improve within 3 days. You have increased bruising or swelling. You have a fever. You lose 10 lb (4.5 kg) or more without trying. Get help right away if: You are not able to move the joint. Your fingers or toes become numb or they turn cold and blue.  Do not take your medicine if  develop an itchy rash, swelling in your mouth or lips, or difficulty breathing.   RESOURCE GUIDE  Chronic Pain Problems: Contact [facility]  [phone] Patients need to be referred by their primary care doctor.  Insufficient Money for Medicine: Contact United Way:  call "211" or [facility] 8054975721.  No Primary Care Doctor: Call [facility] Connect  [phone] - can help you locate a primary care doctor that  accepts your insurance, provides certain services, etc. Physician Referral [phone]  Agencies that provide inexpensive medical care: [facility]  [phone] [facility] Internal Medicine  [phone] [facility]  [phone] [facility]  [phone] Planned [facility]  [phone] [facility]  [phone]  Medicaid-accepting [facility] Providers: [facility]- [address]  [phone], Mon-Fri 9am-7pm, Sat 9am-1pm [facility]- [address] [place], [address] [place]  [phone] [facility]- [address]  [phone] [facility]- [address]  [phone] Renaye Rakers- [address] [place], [address], [phone]  Only accepts [place] patients after they have their name  applied to their card  Self Pay (no insurance) in [facility]: Sickle Cell Patients: Dr Willey Blade, [facility] Medicine  [address] [place],  [phone] [facility]- [address] [place]  [phone]       [facility] [place]- [address]       -     [facility]- see information above (Speak to [employer] if you do not have insurance)       -  [facility]- [address] [place], [phone]       -  [facility]- [address],  [phone]       -  [facility]- [address] [phone]       -  Dr Julio Sicks-  [address], [place], [phone]       -  [facility]- [address], [phone]       -  [facility]- [address] [phone], also [address], [phone]       -    [facility]- [address] [place], [phone], 1st & 3rd Saturday   every month, 10am-1pm  1) Find a Doctor and Pay Out of Pocket Although you won't have to find out who is covered by your insurance plan, it is a good idea to ask around and get recommendations. You will then need to call the office and see if the doctor you have chosen will accept you as a new patient and what types of options they offer for patients who are self-pay. Some doctors offer discounts or will set up payment plans for their patients who do not have insurance, but you will need to ask so you aren't surprised when you get to your appointment.  2) Contact Your Local Health Department Not all health departments have doctors that can see patients for sick visits, but many do, so it is worth a call to see if yours does. If you don't know where your local health department is, you can check in your phone book. The CDC also has a tool to help you locate your state's health department, and many state websites also have listings of all of their local health departments.  3) Find a Walk-in Clinic If your illness is not likely to be very severe or complicated, you may want to try a walk in clinic. These are popping up all over the country in pharmacies,  drugstores, and shopping centers. They're usually staffed by nurse practitioners or physician assistants that have been trained to treat common illnesses and complaints. They're usually fairly quick and inexpensive. However, if you have serious medical issues or chronic medical problems, these are probably not your best option  STD Testing [facility] Department of [facility] [place], STD Clinic, [address], [place], phone [phone] or [phone].  Monday - Friday, call for an appointment. [facility] Department of [employer], STD Clinic, [address], [place], phone [phone] or [phone].  Monday - Friday, call for an appointment.  Abuse/Neglect: [facility] Child Abuse Hotline 9010436748 [facility] Child Abuse Hotline 272-727-5792 (After Hours)  Emergency Shelter:  [employer] 9862704943  Maternity Homes: Room at the [place] [facility] 8601314875 [facility] Alert Services [phone]  MRSA Hotline #:   562-386-3333  [facility] Resources  [facility] [place]  [employer] [facility] Dept. [address]                 [address]         [address] [place] [address]  Blondell Reveal Phone:  [phone]                                  Phone:  [phone]                   Phone:  [phone]  [facility], [phone] [facility]- [phone]       -     [facility] in [place], [address]                                  [phone], [facility] Child Abuse Hotline (351)076-3577 or 765-607-7205 (After Hours)   Behavioral Health Services  Substance Abuse Resources: [facility]  3206289041 [facility] [phone] [facility] [place]  [phone] Floydene Flock [phone] Residential & Outpatient Substance Abuse Program  (603)341-8327  Psychological Services: [facility] Health  [phone] [facility] Services  5342283916 [facility], [phone] [place]. [address], [place], ACCESS LINE: [phone] or (765)435-6388, [employer]  Dental Assistance  If unable to pay or uninsured, contact:  [facility] or [facility]. to become qualified for the adult dental clinic.  Patients with Medicaid: [facility] [phone] [address], [phone] [address] [phone]  If unable to pay, or uninsured, contact [facility] (224) [phone]) or [facility] Department [phone] in [place], [phone] in [facility]) to become qualified for the adult dental clinic   Other Low-Cost Community Dental Services: [facility]- [address] [place], [place], [place], [address], [phone], [phone], 2nd and 4th Thursday of the month at 6:30am.  10 clients each day by appointment, can sometimes see walk-in patients if someone does not show for an appointment. [facility]- [address] [place], [place], [address], [phone] [facility] [address], [place], [place], [address], [phone] [facility] Health Department- [phone] [facility] Health Department- [phone] [place] [phone]

## 2018-08-03 NOTE — ED Provider Notes (Signed)
[facility]-EMERGENCY DEPT Provider Note   CSN: [id number] Arrival date & time: 08/03/18  0725     History   Chief Complaint Chief Complaint  Patient presents with  . Neck Pain    HPI Gabriel Garcia is a 42 y.o. male with history of gout and duodenal ulcer presenting primarily for left-sided neck upper back pain.  Patient states that he was wrestling with his daughter last night and may have tweaked his left back.  Patient states that he went to sleep and when he woke up he noticed his left sided neck was feeling "stiff".  Patient states that he has a mild throbbing/stiff pain that is constant and worsened with turning his neck to the left.  Patient states that he has not taken anything for his pain.  Additionally patient states that he has a history of gout and has noticed his increasing redness and pain to his right MTP.  Patient states that this is normal gout pain.  Patient describes his pain as a sharp pain that is mild in intensity but however becomes severe with movement or palpation of that joint.  Patient denies increased warmth or fever.  Patient denies headache, fever, nausea/vomiting, numbness/tingling or weakness to his extremities, loss of bowel/bladder control or saddle area paresthesias.  HPI  Past Medical History:  Diagnosis Date  . Acute duodenal ulcer with hemorrhage 10/12/2014  . GERD (gastroesophageal reflux disease)   . GI bleed   . Hepatitis C   . Hypertension   . 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 Legally blind in right eye, as defined in [place]   . Mesenteric adenitis   . Sliding hiatal hernia   . Substance abuse (HCC)    cocaine  . 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 Atypical chest pain 06/18/2018  . Mallory-Weiss tear 12/03/2016  . BPPV (benign paroxysmal positional vertigo) 09/12/2016  . Genital herpes 09/12/2016  . OSA (obstructive sleep apnea)  05/28/2016  . AKI (acute kidney injury) (HCC) 04/22/2016  . Obese 11/17/2015  . Cocaine abuse (HCC) 11/16/2015  . Alcoholism (HCC) 11/16/2015  . Gout 11/16/2015  . Legally blind in right eye, as defined in [place]   . Acute duodenal ulcer with hemorrhage 10/12/2014  . EKG abnormality 10/12/2014  . Hypertension 10/06/2014  . Esophageal reflux 11/24/2013  . Hepatitis C     Past Surgical History:  Procedure Laterality Date  . ESOPHAGOGASTRODUODENOSCOPY N/A 10/13/2014   Procedure: ESOPHAGOGASTRODUODENOSCOPY (EGD);  Surgeon: Iva Boop, MD;  [facility];  Service: Endoscopy;  Laterality: N/A;  . ESOPHAGOGASTRODUODENOSCOPY N/A 11/17/2016   Procedure: ESOPHAGOGASTRODUODENOSCOPY (EGD);  Surgeon: Charlott Rakes, MD;  [facility];  Service: Endoscopy;  Laterality: N/A;  . EYE SURGERY Right   . FOOT SURGERY Right   . LIVER BIOPSY     Dr. Kinnie Scales GI        Home Medications    Prior to Admission medications   Medication Sig Start Date End Date Taking? Authorizing Provider  amLODipine (NORVASC) 2.5 MG tablet Take 1 tablet (2.5 mg total) by mouth daily. 06/21/18   Alwyn Ren, MD  cetirizine (ZYRTEC) 10 MG tablet Take 10 mg by mouth daily.    [provider]  lidocaine (LIDODERM) 5 % Place 1 patch onto the skin daily. Remove & Discard patch within 12 hours or as directed by MD 08/03/18   Olevia Bowens,  Aira Sallade A, PA-C  methocarbamol (ROBAXIN) 500 MG tablet Take 1 tablet (500 mg total) by mouth 2 (two) times daily. 08/03/18   Harlene Salts A, PA-C  predniSONE (DELTASONE) 10 MG tablet Take 4 tablets (40 mg total) by mouth daily for 5 days. 08/03/18 08/08/18  Harlene Salts A, PA-C  sucralfate (CARAFATE) 1 g tablet Take 1 tablet (1 g total) by mouth 4 (four) times daily -  with meals and at bedtime. 08/03/18   Bill Salinas, PA-C  valACYclovir (VALTREX) 500 MG tablet Take 1 tablet (500 mg total) by mouth 2 (two) times daily. For 3 days with outbreaks  04/09/18   [name], MD    Family History Family History  Problem Relation Age of Onset  . Rheum arthritis Mother   . Hypertension Mother   . Alcoholism Mother   . Heart failure Father        rheumatic heart disease, also drinking and smoking  . Alcoholism Father        died age 24  . Colon cancer Neg Hx     Social History Social History   Tobacco Use  . Smoking status: Never Smoker  . Smokeless tobacco: Never Used  Substance Use Topics  . Alcohol use: No    Comment: etoh free since 08/2015.  . Drug use: No    Comment: used cocaine before but has been clean since 08/2015.      Allergies   Patient has no known allergies.   Review of Systems Review of Systems  Constitutional: Negative.  Negative for chills and fever.  Eyes: Negative.  Negative for visual disturbance.  Respiratory: Negative.  Negative for shortness of breath.   Cardiovascular: Negative.  Negative for chest pain.  Gastrointestinal: Negative.  Negative for abdominal pain, nausea and vomiting.  Musculoskeletal: Positive for arthralgias, back pain and neck pain.  Skin: Positive for color change. Negative for rash.  Neurological: Negative.  Negative for dizziness, weakness, numbness and headaches.       Denies saddle area paresthesias Denies bowel/bladder incontinence   Physical Exam Updated Vital Signs BP (!) 156/117   Pulse 72   Temp 97.6 F (36.4 C) (Oral)   Resp 16   Ht 5\' 10"  (1.778 m)   Wt 99.8 kg   SpO2 97%   BMI 31.57 kg/m   Physical Exam  Constitutional: He appears well-developed and well-nourished. No distress.  HENT:  Head: Normocephalic and atraumatic.  Right Ear: External ear normal.  Left Ear: External ear normal.  Nose: Nose normal.  Mouth/Throat: Uvula is midline, oropharynx is clear and moist and mucous membranes are normal.  Eyes: Pupils are equal, round, and reactive to light. EOM are normal.  Neck: Trachea normal, normal range of motion and phonation normal.  Neck supple. Muscular tenderness present. No tracheal tenderness and no spinous process tenderness present. No tracheal deviation present.    Cardiovascular:  Pulses:      Dorsalis pedis pulses are 2+ on the right side, and 2+ on the left side.       Posterior tibial pulses are 2+ on the right side, and 2+ on the left side.  Pulmonary/Chest: Effort normal and breath sounds normal. No respiratory distress. He exhibits no tenderness, no crepitus and no deformity.  Abdominal: Soft. There is no tenderness. There is no rebound and no guarding.  Musculoskeletal: Normal range of motion.       Right ankle: Normal.       Left ankle: Normal.  Cervical back: He exhibits tenderness. He exhibits normal range of motion, no bony tenderness, no edema and no deformity.       Thoracic back: Normal.       Lumbar back: Normal.       Back:       Right lower leg: Normal.       Left lower leg: Normal.       Right foot: There is tenderness. There is normal capillary refill and no deformity.       Left foot: Normal.       Feet:  Patient with mild left-sided paraspinal muscular tenderness to the neck and upper back.  No midline spinal tenderness to palpation.  No crepitus step-off or deformity of the spine.  No signs of injury.  Patient with full range of motion of the neck, endorses mild left trapezius pain with movement, no midline tenderness with movement.  Patient with mild erythema and minimal swelling to right MTP joint.  No increased warmth.  Patient acutely tender to palpation and movement of the area.  No induration or streaking present.  Capillary refill intact.  Patient with full range of motion however with increased pain.  Feet:  Right Foot:  Protective Sensation: 3 sites tested. 3 sites sensed.  Left Foot:  Protective Sensation: 3 sites tested. 3 sites sensed.  Neurological: He is alert. No sensory deficit. GCS eye subscore is 4. GCS verbal subscore is 5. GCS motor subscore is 6.  Speech  is clear and goal oriented, follows commands Major Cranial nerves without deficit, no facial droop Normal strength in upper and lower extremities bilaterally including dorsiflexion and plantar flexion, strong and equal grip strength Sensation normal to light touch Moves extremities without ataxia, coordination intact Normal gait  Skin: Skin is warm and dry. Capillary refill takes less than 2 seconds.  Psychiatric: He has a normal mood and affect. His behavior is normal.   ED Treatments / Results  Labs (all labs ordered are listed, but only abnormal results are displayed) Labs Reviewed - No data to display  EKG None  Radiology No results found.  Procedures Procedures (including critical care time)  Medications Ordered in ED Medications  lidocaine (LIDODERM) 5 % 1 patch (has no administration in time range)     Initial Impression / Assessment and Plan / ED Course  I have reviewed the triage vital signs and the nursing notes.  Pertinent labs & imaging results that were available during my care of the patient were reviewed by me and considered in my medical decision making (see chart for details).  Clinical Course as of Aug 03 1016  Mon Aug 03, 2018  1610 Discussed with Dr. Freida Busman; prednisone, carafate, lidoderm and robaxin.   [BM]    Clinical Course User Index [BM] Bill Salinas, PA-C   42 year old male with history of gout presenting for left sided neck/upper back pain as well as right MTP joint pain.   Dalbert Keymani Mclean is a 42 y.o. male who presents to ED for left sided neck pain. On exam, patient is with negative NEXUS (no midline spinal tenderness, no focal neuro deficits, no evidence of intoxication is present, normal level of alertness, no painful distracting injury). Signs and symptoms most likely due to muscle strain. Will give rx for muscle relaxer and Lidoderm.  Patient informed not to drive or operate machinery while taking Robaxin.  Unable to prescribe  patient NSAIDs due to history of duodenal ulcer.  Symptomatic home care instructions  and return precautions discussed. Strongly encouraged to follow up with primary-care provider.  Patient also presenting with right MTP joint pain and erythema for 2 days.  Pain only severe with movement and palpation of the area.  Patient neurovascularly intact with full range of motion however with greatly increased pain with movement.  Capillary refill intact.  No streaking or increased warmth.  Patient afebrile and not tachycardic.  Doubt septic joint at this time.  Patient with history of gout and states that this feels like his normal gout flare.  Patient has been given short course of prednisone to help with gout pain.  Patient given carafate prescription for stomach pain prophylaxis.  Patient afebrile, not tachycardic, not hypotensive, well-appearing in no acute distress.  No signs of infection at this time.  The patient was noted to have elevated BP in ED today. Hx of HTN and has not taken daily medications today. I have spoken with the patient regarding elevated blood pressure readings and the need for improved management. I instructed the patient to followup with their PCP within 1 week for BP check. I also counseled the patient regarding the signs and symptoms which would require an emergent visit to an emergency department for hypertensive urgency and/or hypertensive emergency.  At this time there does not appear to be any evidence of an acute emergency medical condition and the patient appears stable for discharge with appropriate outpatient follow up. Diagnosis was discussed with patient who verbalizes understanding of care plan and is agreeable to discharge. I have discussed return precautions with patient who verbalizes understanding of return precautions. Patient strongly encouraged to follow-up with their PCP. All questions answered.  Patient's case discussed with Dr. Freida Busman who agrees with plan of  prednisone, Carafate, Robaxin and Lidoderm with discharge with follow-up.     Note: Portions of this report may have been transcribed using voice recognition software. Every effort was made to ensure accuracy; however, inadvertent computerized transcription errors may still be present.  Final Clinical Impressions(s) / ED Diagnoses   Final diagnoses:  Pain of toe of right foot  Elevated blood pressure reading  Strain of neck muscle, initial encounter    ED Discharge Orders         Ordered    lidocaine (LIDODERM) 5 %  Every 24 hours     08/03/18 1004    predniSONE (DELTASONE) 10 MG tablet  Daily     08/03/18 1004    methocarbamol (ROBAXIN) 500 MG tablet  2 times daily     08/03/18 1004    sucralfate (CARAFATE) 1 g tablet  3 times daily with meals & bedtime     08/03/18 1014           Elizabeth Garcia 08/03/18 1720    Lorre Nick, MD 08/04/18 [phone]

## 2018-08-11 ENCOUNTER — Emergency Department (HOSPITAL_COMMUNITY): Admission: EM | Admit: 2018-08-11 | Discharge: 2018-08-11 | Discharge status: Absent without leave | Payer: Self-pay

## 2018-08-11 ENCOUNTER — Emergency Department (HOSPITAL_COMMUNITY)
Admission: EM | Admit: 2018-08-11 | Discharge: 2018-08-11 | Disposition: A | Discharge status: Home / Self Care | Payer: 59 | Attending: Emergency Medicine | Admitting: Emergency Medicine

## 2018-08-11 ENCOUNTER — Other Ambulatory Visit: Payer: Self-pay

## 2018-08-11 ENCOUNTER — Encounter (HOSPITAL_COMMUNITY): Payer: Self-pay

## 2018-08-11 DIAGNOSIS — R030 Elevated blood-pressure reading, without diagnosis of hypertension: Secondary | ICD-10-CM

## 2018-08-11 DIAGNOSIS — Z79899 Other long term (current) drug therapy: Secondary | ICD-10-CM | POA: Diagnosis not present

## 2018-08-11 DIAGNOSIS — I10 Essential (primary) hypertension: Secondary | ICD-10-CM | POA: Diagnosis not present

## 2018-08-11 DIAGNOSIS — M79672 Pain in left foot: Secondary | ICD-10-CM | POA: Diagnosis present

## 2018-08-11 DIAGNOSIS — M109 Gout, unspecified: Secondary | ICD-10-CM | POA: Diagnosis not present

## 2018-08-11 MED ORDER — COLCHICINE 0.6 MG PO TABS: 0.6000 mg | ORAL_TABLET | Freq: Every day | ORAL | 0 refills | Status: DC

## 2018-08-11 NOTE — ED Triage Notes (Signed)
Pt reports hx of gout. Pt reports a gout flare in right great toe. Pt reports that he was seen here last week and prescribed prednisone for the same. Pt reports that the prednisone does not work for him.

## 2018-08-11 NOTE — Discharge Instructions (Signed)
Please return to the Emergency Department for any new or worsening symptoms or if your symptoms do not improve. Please be sure to follow up with your Primary Care Physician as soon as possible regarding your visit today. If you do not have a Primary Doctor please use the resources below to establish one. You may use the medication colchicine as prescribed for your gout pain.  Please drink plenty of water while taking this medication.  Please follow-up with a rheumatologist or the orthopedic specialist regarding your toe pain. Please avoid NSAID use such as ibuprofen/Motrin.  Taking these medications is dangerous due to your history of gastric ulcers. You have refused further work-up to investigate etiologies of your left toe pain and swelling.  You may return to the emergency department at any time to have further work-up done. Your blood pressure was elevated today.  Please follow-up with your primary care provider as soon as possible for blood pressure recheck and medication management.  Contact a health care provider if: You have another gout attack. You continue to have symptoms of a gout attack after 10 days of treatment. You have side effects from your medicines. You have chills or a fever. You have burning pain when you urinate. You have pain in your lower back or belly. Get help right away if: You have severe or uncontrolled pain. You cannot urinate.  Do not take your medicine if  develop an itchy rash, swelling in your mouth or lips, or difficulty breathing.   RESOURCE GUIDE  Chronic Pain Problems: Contact [facility]  [phone] Patients need to be referred by their primary care doctor.  Insufficient Money for Medicine: Contact United Way:  call "211" or [facility] (903)886-0644.  No Primary Care Doctor: Call [facility]  [phone] - can help you locate a primary care doctor that  accepts your insurance, provides certain services, etc. Physician  Referral Service- [phone]  Agencies that provide inexpensive medical care: [facility]  [phone] [facility]  [phone] [facility]  [phone] [facility]  [phone] [facility]  [phone] [facility]  [phone]  Medicaid-accepting [facility] Providers: [facility]- [address]  [phone], Mon-Fri 9am-7pm, Sat 9am-1pm [facility]- [address] [place], Suite [place]  [phone] [facility]- [address], Suite [place]  [phone] [facility]- [address]  [phone] Renaye Rakers- [address] [place], [address], [phone]  Only accepts [place] patients after they have their name  applied to their card  Self Pay (no insurance) in [facility]: Sickle Cell Patients: Dr Willey Blade, [facility]  [address] [place], [phone] [facility]- [address] [place]  [phone]       [facility] [place]- [address]       -     [facility]- see information above (Speak to [employer] if you do not have insurance)       -  [facility]- [address] [place], [phone]       -  [facility]- [address],  [phone]       -  [facility]- [address], [phone]       -  Dr Julio Sicks-  [phone], [address],  [place], [phone]       -  [facility]- [address], [phone]       -  [facility]- [address], [phone], also [address], [phone]       -    [facility]- [address] [place], [phone], 1st & 3rd Saturday   every month, 10am-1pm  1) Find a Doctor and Pay Out of Pocket Although you won't have to find out who is covered by your insurance plan, it is a good idea to ask around and get recommendations. You will then need to call the office and see if  the doctor you have chosen will accept you as a new patient and what types of options they offer for patients who are self-pay. Some doctors offer discounts or will set up payment plans for their patients who do not have insurance, but you will need to ask so you aren't surprised when you get to your appointment.  2) Contact Your Local Health Department Not all health departments have doctors that can see patients for sick visits, but many do, so it is worth a call to see if yours does. If you don't know where your local health department is, you can check in your phone book. The CDC also has a tool to help you locate your state's health department, and many state websites also have listings of all of their local health departments.  3) Find a Walk-in Clinic If your illness is not likely to be very severe or complicated, you may want to try a walk in clinic. These are popping up all over the country in pharmacies, drugstores, and shopping centers. They're usually staffed by nurse practitioners or physician assistants that have been trained to treat common illnesses and complaints. They're usually fairly quick and inexpensive. However, if you have serious medical issues or chronic medical problems, these are probably not your best option  STD Testing [facility] Department of [facility], STD Clinic, [address] [place], phone [phone] or [phone].  Monday - Friday, call for an appointment. [facility] Department of [employer], STD Clinic, [address], [place], phone [phone] or [phone].  Monday - Friday, call for an appointment.  Abuse/Neglect: [facility] Child Abuse Hotline (321)838-7304 [facility] Child Abuse Hotline 303-105-8743 (After Hours)  Emergency Shelter:  [facility] (204) 029-2514  Maternity Homes: [facility] (850)689-0209 [facility] (224)426-6207  MRSA  Hotline #:   (445)045-9962  [facility] Resources  [facility] [place]  United Way [facility] Dept. [address]                 [address]         [address] [place] [address]  [place]                                               Cristobal Goldmann Phone:  [phone]  Phone:  [phone]                   Phone:  [phone]  [facility], [phone] [facility] [place]- [phone]       -     [facility] in [place], [address]                                  [phone], [facility] Child Abuse Hotline 910-172-3284 or (804)756-5592 (After Hours)   Behavioral Health Services  Substance Abuse Resources: [facility] and Drug Services  (601) 032-3220 [facility] [phone] [facility] [place] [phone] Floydene Flock [phone] Residential & Outpatient Substance Abuse Program  8256689438  Psychological Services: [facility]  [phone] [facility] Services  [phone] [facility], [phone] [place]. [address], [place], ACCESS LINE: 646-447-0775 or (678)526-2836, [employer]za  Dental Assistance  If unable to pay or uninsured, contact:  [facility] or [facility]. to become qualified for the adult dental clinic.  Patients with Medicaid: [facility] [phone] [address], [phone] [address] [phone]  If unable to pay, or uninsured, contact [facility] [phone]) or [facility] Department [phone] in [place], [phone] in [facility]) to become qualified for the adult dental clinic   Other Low-Cost Community Dental Services: [facility]- [address] [place], [place], [place], [address], [phone], [address], 2nd and 4th Thursday of the month at 6:30am.  10  clients each day by appointment, can sometimes see walk-in patients if someone does not show for an appointment. [facility]- [address] [place], [place], [address], [phone] [facility] [address] [place], [place], [address], [phone] [facility] Health Department- [phone] [facility] Health Department- [phone] [facility] [phone]

## 2018-08-11 NOTE — ED Notes (Signed)
Upon going into pts room to discharge pt. Pt noted to not be in room. Pt did not notify staff that he was leaving. Pt removed monitoring equipment. Pt last seen stable and NAD noted.

## 2018-08-11 NOTE — ED Provider Notes (Addendum)
[facility]-EMER[facility]ENC[facility]PT Provider Note   CSN: [id number] Arrival date & time: 08/11/18  1214     History   Chief Complaint Chief Complaint  Patient presents with  . Foot Pain    HPI Gabriel Garcia is a 42 y.o. male presenting for right MTP pain that has been persistent for approximately 8 days.  Patient states that he has a history of gout and this feels like his "normal gout pain".  Patient describes his right MTP pain as a sharp, constant severe pain that is worsened with movement of the toe.  Patient endorses mild erythema and swelling to the joint.  Patient denies fever, streaking, injury or drainage from the area.  Patient denies injury to the area.  Of note patient was seen here on 08/03/2018 for neck pain as well as right MTP pain.  Patient's neck pain has resolved at this time however the prednisone prescribed for his gout pain has been minimally effective.  Patient was not prescribed NSAIDs at that time due to history of gastric ulcers.  Patient states that he has been using over-the-counter ibuprofen at home in addition to his prednisone despite being counseled not to take NSAIDs.  Patient states that his OTC ibuprofen has been of minimal relief as well.  Patient denies stomach pain, change in stool color, fever, chest pain, shortness of breath, leg swelling, injury/trauma, headache, dizziness, numbness/tingling or weakness.  HPI  Past Medical History:  Diagnosis Date  . Acute duodenal ulcer with hemorrhage 10/12/2014  . GERD (gastroesophageal reflux disease)   . GI bleed   . Hepatitis C   . Hypertension   . 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 Legally blind in right eye, as defined in [place]   . Mesenteric adenitis   . Sliding hiatal hernia   . Substance abuse (HCC)    cocaine  . Terminal ileitis [facility])     Patient Active Problem List   Diagnosis Date Noted  . Atypical chest pain 06/18/2018  . Mallory-Weiss tear  12/03/2016  . BPPV (benign paroxysmal positional vertigo) 09/12/2016  . Genital herpes 09/12/2016  . OSA (obstructive sleep apnea) 05/28/2016  . AKI (acute kidney injury) (HCC) 04/22/2016  . Obese 11/17/2015  . Cocaine abuse (HCC) 11/16/2015  . Alcoholism (HCC) 11/16/2015  . Gout 11/16/2015  . Legally blind in right eye, as defined in [place]   . Acute duodenal ulcer with hemorrhage 10/12/2014  . EKG abnormality 10/12/2014  . Hypertension 10/06/2014  . Esophageal reflux 11/24/2013  . Hepatitis C     Past Surgical History:  Procedure Laterality Date  . ESOPHAGOGASTRODUODENOSCOPY N/A 10/13/2014   Procedure: ESOPHAGOGASTRODUODENOSCOPY (EGD);  Surgeon: Iva Boop, MD;  [facility];  Service: Endoscopy;  Laterality: N/A;  . ESOPHAGOGASTRODUODENOSCOPY N/A 11/17/2016   Procedure: ESOPHAGOGASTRODUODENOSCOPY (EGD);  Surgeon: Charlott Rakes, MD;  [facility] END[facility]COPY;  Service: Endoscopy;  Laterality: N/A;  . EYE SURGERY Right   . FOOT SURGERY Right   . LIVER BIOPSY     Dr. Kinnie Scales GI        Home Medications    Prior to Admission medications   Medication Sig Start Date End Date Taking? Authorizing Provider  amLODipine (NORVASC) 2.5 MG tablet Take 1 tablet (2.5 mg total) by mouth daily. 06/21/18   Alwyn Ren, MD  cetirizine (ZYRTEC) 10 MG tablet Take 10 mg by mouth daily.    [provider]  colchicine 0.6 MG tablet Take 1  tablet (0.6 mg total) by mouth daily. 08/11/18   Harlene Salts A, PA-C  lidocaine (LIDODERM) 5 % Place 1 patch onto the skin daily. Remove & Discard patch within 12 hours or as directed by MD 08/03/18   Bill Salinas, PA-C  methocarbamol (ROBAXIN) 500 MG tablet Take 1 tablet (500 mg total) by mouth 2 (two) times daily. 08/03/18   Harlene Salts A, PA-C  sucralfate (CARAFATE) 1 g tablet Take 1 tablet (1 g total) by mouth 4 (four) times daily -  with meals and at bedtime. 08/03/18   Bill Salinas, PA-C  valACYclovir (VALTREX)  500 MG tablet Take 1 tablet (500 mg total) by mouth 2 (two) times daily. For 3 days with outbreaks 04/09/18   [name], MD    Family History Family History  Problem Relation Age of Onset  . Rheum arthritis Mother   . Hypertension Mother   . Alcoholism Mother   . Heart failure Father        rheumatic heart disease, also drinking and smoking  . Alcoholism Father        died age 17  . Colon cancer Neg Hx     Social History Social History   Tobacco Use  . Smoking status: Never Smoker  . Smokeless tobacco: Never Used  Substance Use Topics  . Alcohol use: No    Comment: etoh free since 08/2015.  . Drug use: No    Comment: used cocaine before but has been clean since 08/2015.      Allergies   Patient has no known allergies.   Review of Systems Review of Systems  Constitutional: Negative.  Negative for chills and fever.  Respiratory: Negative.  Negative for shortness of breath.   Cardiovascular: Negative.  Negative for chest pain.  Gastrointestinal: Negative.  Negative for abdominal pain and blood in stool.  Musculoskeletal: Positive for arthralgias and joint swelling. Negative for back pain and neck pain.  Skin: Positive for color change. Negative for rash and wound.  Neurological: Negative.  Negative for weakness, numbness and headaches.   Physical Exam Updated Vital Signs BP (!) 144/103 (BP Location: Right Arm)   Pulse 79   Temp (!) 97.4 F (36.3 C) (Oral)   Resp 18   Wt 99.8 kg   SpO2 100%   BMI 31.57 kg/m   Physical Exam  Constitutional: He appears well-developed and well-nourished. No distress.  HENT:  Head: Normocephalic and atraumatic.  Right Ear: External ear normal.  Left Ear: External ear normal.  Nose: Nose normal.  Eyes: Pupils are equal, round, and reactive to light. EOM are normal.  Neck: Trachea normal and normal range of motion. No tracheal deviation present.  Cardiovascular:  Pulses:      Dorsalis pedis pulses are 2+ on the right  side, and 2+ on the left side.       Posterior tibial pulses are 2+ on the right side, and 2+ on the left side.  Pulmonary/Chest: Effort normal. No respiratory distress.  Abdominal: Soft. There is no tenderness. There is no rebound and no guarding.  Musculoskeletal: Normal range of motion.       Right knee: Normal.       Left knee: Normal.       Right ankle: Normal.       Left ankle: Normal.       Right lower leg: Normal.       Left lower leg: Normal.       Right foot:  There is tenderness and swelling. There is normal capillary refill and no deformity.       Left foot: Normal.       Feet:  Patient with mild swelling and erythema to right first MTP.  No streaking, fluctuance or induration present.  No bruising/ecchymosis.  Capillary refill intact all toes.  Sensation intact to all toes.  Pedal pulses intact and equal bilaterally.  Patient is able to flex and extend the toe slightly however with increase in pain.  Range of motion inhibited due to pain.  Feet:  Right Foot:  Protective Sensation: 3 sites tested. 3 sites sensed.  Left Foot:  Protective Sensation: 3 sites tested. 3 sites sensed.  Neurological: He is alert. No sensory deficit. GCS eye subscore is 4. GCS verbal subscore is 5. GCS motor subscore is 6.  Speech is clear and goal oriented, follows commands Major Cranial nerves without deficit, no facial droop Normal strength in upper and lower extremities bilaterally including dorsiflexion and plantar flexion, strong and equal grip strength Sensation normal to light touch Moves extremities without ataxia, coordination intact Normal gait  Skin: Skin is warm and dry. Capillary refill takes less than 2 seconds.  Psychiatric: He has a normal mood and affect. His behavior is normal.    ED Treatments / Results  Labs (all labs ordered are listed, but only abnormal results are displayed) Labs Reviewed - No data to display  EKG None  Radiology No results  found.  Procedures Procedures (including critical care time)  Medications Ordered in ED Medications - No data to display   Initial Impression / Assessment and Plan / ED Course  I have reviewed the triage vital signs and the nursing notes.  Pertinent labs & imaging results that were available during my care of the patient were reviewed by me and considered in my medical decision making (see chart for details).  Clinical Course as of Aug 11 1325  Tue Aug 11, 2018  1319 Case discussed with Dr. Lynelle Doctor who agrees with colchicine.   [BM]    Clinical Course User Index [BM] Bill Salinas, PA-C   History of hep C-completed treatment- last LFTs on 06/19/2018 within normal limits Last creatinine on 06/20/2018-1.25 during admission, without urinary complaints or decreased urination states has been drinking plenty of water. No history of CKD.  42 year old male with history of gout presenting with his "normal gout pain".  Patient recently seen in the ED on 08/03/2018.  At that time he was prescribed prednisone and discharged.  Patient with history of duodenal ulcer so indomethacin was not prescribed.  Patient states that he has been compliant with prednisone therapy without improvement of pain.  Patient states that he has been taking ibuprofen AGAINST MEDICAL ADVICE without improvement of pain.  First right MTP joint slightly swollen with mild increased warmth.  No streaking present.  Afebrile, not tachycardic. Neurovascularly intact.  Do not suspect septic arthritis.  I have offered the patient imaging and further work-up of his left toe pain to evaluate for multiple etiologies including but not limited to fracture and infection.  Patient has refused further work-up of the toe, states that he has used colchicine in the past for his gout pain with improvement and wishes to take this medication today.  I discussed the risks of colchicine with the patient and that improvement is less likely considering  that he is approximately 8 days into his gout flare.  Patient wishes to proceed with colchicine.  I have again  advised patient not to use NSAIDs due to his history of gastric ulcers.  Patient will be discharged with low-dose colchicine for gout pain.  Orthopedics follow-up and strict return precautions.  Patient's case and colchicine have been discussed with Dr. Lynelle Doctor who agrees with treatment plan and discharge at this time.  Patient is afebrile, not tachycardic, not hypotensive, well-appearing and in no acute distress.  Patient does not meet SIRS criteria.  At this time there does not appear to be any evidence of an acute emergency medical condition and the patient appears stable for discharge with appropriate outpatient follow up. Diagnosis was discussed with patient who verbalizes understanding of care plan and is agreeable to discharge. I have discussed return precautions with patient who verbalizes understanding of return precautions. Patient strongly encouraged to follow-up with their PCP and ortho. All questions answered.  Informed by nursing staff that patient left prior to receiving discharge paperwork.  Patient did not receive physical colchicine prescription.  Prescription shredded.  Note: Portions of this report may have been transcribed using voice recognition software. Every effort was made to ensure accuracy; however, inadvertent computerized transcription errors may still be present.  Final Clinical Impressions(s) / ED Diagnoses   Final diagnoses:  Gout involving toe of left foot, unspecified cause, unspecified chronicity    ED Discharge Orders         Ordered    colchicine 0.6 MG tablet  Daily     08/11/18 1323           Bill Salinas, PA-C 08/11/18 1343    Bill Salinas, PA-C 08/11/18 1358    Linwood Dibbles, MD 08/11/18 1541

## 2018-08-30 ENCOUNTER — Observation Stay (HOSPITAL_COMMUNITY)
Admission: EM | Admit: 2018-08-30 | Discharge: 2018-09-02 | Disposition: A | Discharge status: Home / Self Care | Payer: 59 | Attending: Internal Medicine | Admitting: Internal Medicine

## 2018-08-30 ENCOUNTER — Other Ambulatory Visit: Payer: Self-pay

## 2018-08-30 ENCOUNTER — Encounter (HOSPITAL_COMMUNITY): Payer: Self-pay

## 2018-08-30 DIAGNOSIS — G4733 Obstructive sleep apnea (adult) (pediatric): Secondary | ICD-10-CM | POA: Diagnosis not present

## 2018-08-30 DIAGNOSIS — K922 Gastrointestinal hemorrhage, unspecified: Secondary | ICD-10-CM | POA: Diagnosis present

## 2018-08-30 DIAGNOSIS — K449 Diaphragmatic hernia without obstruction or gangrene: Secondary | ICD-10-CM | POA: Diagnosis not present

## 2018-08-30 DIAGNOSIS — Z23 Encounter for immunization: Secondary | ICD-10-CM | POA: Diagnosis not present

## 2018-08-30 DIAGNOSIS — Z791 Long term (current) use of non-steroidal anti-inflammatories (NSAID): Secondary | ICD-10-CM | POA: Diagnosis not present

## 2018-08-30 DIAGNOSIS — R000 Tachycardia, unspecified: Secondary | ICD-10-CM | POA: Insufficient documentation

## 2018-08-30 DIAGNOSIS — N179 Acute kidney failure, unspecified: Secondary | ICD-10-CM | POA: Diagnosis not present

## 2018-08-30 DIAGNOSIS — R140 Abdominal distension (gaseous): Secondary | ICD-10-CM | POA: Diagnosis not present

## 2018-08-30 DIAGNOSIS — I10 Essential (primary) hypertension: Secondary | ICD-10-CM | POA: Insufficient documentation

## 2018-08-30 DIAGNOSIS — R109 Unspecified abdominal pain: Secondary | ICD-10-CM | POA: Diagnosis not present

## 2018-08-30 DIAGNOSIS — R110 Nausea: Secondary | ICD-10-CM | POA: Insufficient documentation

## 2018-08-30 DIAGNOSIS — H548 Legal blindness, as defined in USA: Secondary | ICD-10-CM | POA: Insufficient documentation

## 2018-08-30 DIAGNOSIS — Z8719 Personal history of other diseases of the digestive system: Secondary | ICD-10-CM | POA: Diagnosis not present

## 2018-08-30 DIAGNOSIS — K210 Gastro-esophageal reflux disease with esophagitis: Secondary | ICD-10-CM | POA: Diagnosis not present

## 2018-08-30 DIAGNOSIS — K264 Chronic or unspecified duodenal ulcer with hemorrhage: Secondary | ICD-10-CM | POA: Diagnosis not present

## 2018-08-30 DIAGNOSIS — M109 Gout, unspecified: Secondary | ICD-10-CM | POA: Insufficient documentation

## 2018-08-30 DIAGNOSIS — I959 Hypotension, unspecified: Secondary | ICD-10-CM | POA: Insufficient documentation

## 2018-08-30 DIAGNOSIS — R6881 Early satiety: Secondary | ICD-10-CM | POA: Insufficient documentation

## 2018-08-30 DIAGNOSIS — K921 Melena: Secondary | ICD-10-CM | POA: Insufficient documentation

## 2018-08-30 DIAGNOSIS — B182 Chronic viral hepatitis C: Secondary | ICD-10-CM | POA: Diagnosis not present

## 2018-08-30 LAB — COMPREHENSIVE METABOLIC PANEL
ALK PHOS: 37 U/L — AB (ref 38–126)
ALT: 13 U/L (ref 0–44)
AST: 11 U/L — AB (ref 15–41)
Albumin: 3.6 g/dL (ref 3.5–5.0)
Anion gap: 5 (ref 5–15)
BUN: 26 mg/dL — AB (ref 6–20)
CHLORIDE: 109 mmol/L (ref 98–111)
CO2: 27 mmol/L (ref 22–32)
Calcium: 8.7 mg/dL — ABNORMAL LOW (ref 8.9–10.3)
Creatinine, Ser: 1.32 mg/dL — ABNORMAL HIGH (ref 0.61–1.24)
GFR calc Af Amer: >60 mL/min (ref >60)
GFR calc non Af Amer: >60 mL/min (ref >60)
Glucose, Bld: 132 mg/dL — ABNORMAL HIGH (ref 70–99)
Potassium: 3.6 mmol/L (ref 3.5–5.1)
SODIUM: 141 mmol/L (ref 135–145)
Total Bilirubin: 0.6 mg/dL (ref 0.3–1.2)
Total Protein: 6.3 g/dL — ABNORMAL LOW (ref 6.5–8.1)

## 2018-08-30 LAB — CBC
HEMATOCRIT: 31.6 % — AB (ref 39.0–52.0)
Hemoglobin: 10 g/dL — ABNORMAL LOW (ref 13.0–17.0)
MCH: 31.3 pg (ref 26.0–34.0)
MCHC: 31.6 g/dL (ref 30.0–36.0)
MCV: 98.8 fL (ref 80.0–100.0)
Platelets: 234 10*3/uL (ref 150–400)
RBC: 3.2 MIL/uL — ABNORMAL LOW (ref 4.22–5.81)
RDW: 13.7 % (ref 11.5–15.5)
WBC: 7.6 10*3/uL (ref 4.0–10.5)
nRBC: 0 % (ref 0.0–0.2)

## 2018-08-30 LAB — POC OCCULT BLOOD, ED: Fecal Occult Bld: POSITIVE — AB

## 2018-08-30 LAB — TYPE AND SCREEN
ABO/RH(D): A POS
Antibody Screen: NEGATIVE

## 2018-08-30 MED ORDER — LACTATED RINGERS IV BOLUS
500.0000 mL | Freq: Once | INTRAVENOUS | Status: AC
  Administered 2018-08-30: 500 mL via INTRAVENOUS

## 2018-08-30 MED ORDER — PANTOPRAZOLE SODIUM 40 MG IV SOLR
40.0000 mg | Freq: Once | INTRAVENOUS | Status: AC
  Administered 2018-08-30: 40 mg via INTRAVENOUS
  Filled 2018-08-30: qty 40

## 2018-08-30 MED ORDER — ACETAMINOPHEN 325 MG PO TABS
650.0000 mg | ORAL_TABLET | Freq: Four times a day (QID) | ORAL | Status: DC | PRN
  Filled 2018-08-30: qty 2

## 2018-08-30 MED ORDER — PANTOPRAZOLE SODIUM 40 MG IV SOLR: 40.0000 mg | Freq: Two times a day (BID) | INTRAVENOUS | Status: DC

## 2018-08-30 MED ORDER — SODIUM CHLORIDE 0.9 % IV SOLN
8.0000 mg/h | INTRAVENOUS | Status: DC
  Administered 2018-08-30 – 2018-08-31 (×2): 8 mg/h via INTRAVENOUS
  Filled 2018-08-30 (×5): qty 80

## 2018-08-30 NOTE — H&P (View-Only) (Signed)
History and Physical  Gabriel Garcia NWG:[id number] DOB: Dec 07, 1975 DOA: 08/30/2018 1645  Referring physician: Loletha Carrow [facility] ED) PCP: [name], MD   HISTORY   Chief Complaint: melena and tarry stools x 1 week  HPI: [name] is a 42 y.o. male prior hx of UGIB (esophagitis, gastritis, mallory weiss tear), GERD, HCV, HTN, gout, former cocaine and alcohol abuse who presented to [facility] ED after about 1 week of dark ("black") stools. Patient states he had a gout flare in his R big toe x 2 weeks ago and was prescribed prednisone instead of usual colchicine -- from the history and chart review, suggests that patient had AKI in September and his outpatient provider may have been concerned re: renal function. However, the flare and toe pain persisted so patient resorted to taking several days of OTC ibuprofen. He also reports that the past Saturday (8 days ago) he had a "few" alcoholic drinks as well -- denies drinking regularly or binge drinking. Then starting last week Sunday (7 days ago), he noticed that his stools were very dark -- since then he has been having melanotic and/or tarry stools every day. No bright red blood. His significant other also reports that patient has been complaining of feeling bloated and early satiety post-prandially. On day of admission, last BM was in early afternoon and nearly black in color, which prompted patient to come to the ED. Otherwise no complaints.   Last EGD in Feb 2018 in setting of UGIB showed sever esophagitis, acute gastritis, and grade D mallory-weiss tear. He denies having melena/tarry stools since that last scope.    Review of Systems:  +black stools as above; feeling bloated; no abdominal pain; no nausea or vomiting - no fevers/chills - no cough - no chest pain, dyspnea on exertion - no edema, PND, orthopnea - no dysuria, increased urinary frequency - no weight changes Rest of systems reviewed are negative, except as per above  history.   ED course:  Vitals Blood pressure 126/86, pulse 83, temperature 97.9 F (36.6 C), resp. rate 18, height 5\' 10"  (1.778 m), weight 99.7 kg, SpO2 98 %. Received protonix 40mg  IV x 1; LR bolus 500cc x 1.   Past Medical History:  Diagnosis Date  . Acute duodenal ulcer with hemorrhage 10/12/2014  . GERD (gastroesophageal reflux disease)   . GI bleed   . Hepatitis C   . Hypertension   . 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 Legally blind in right eye, as defined in [place]   . Mesenteric adenitis   . Sliding hiatal hernia   . Substance abuse (HCC)    cocaine  . Terminal ileitis [facility] )    Past Surgical History:  Procedure Laterality Date  . ESOPHAGOGASTRODUODENOSCOPY N/A 10/13/2014   Procedure: ESOPHAGOGASTRODUODENOSCOPY (EGD);  Surgeon: Iva Boop, MD;  Location: [facility];  Service: Endoscopy;  Laterality: N/A;  . ESOPHAGOGASTRODUODENOSCOPY N/A 11/17/2016   Procedure: ESOPHAGOGASTRODUODENOSCOPY (EGD);  Surgeon: Charlott Rakes, MD;  Location: [facility] ENDOSCOPY;  Service: Endoscopy;  Laterality: N/A;  . EYE SURGERY Right   . FOOT SURGERY Right   . LIVER BIOPSY     Dr. Kinnie Scales GI    Social History:  reports that he has never smoked. He has never used smokeless tobacco. He reports that he does not drink alcohol or use drugs.  No Known Allergies  Family History  Problem Relation Age of Onset  . Rheum arthritis Mother   .  Hypertension Mother   . Alcoholism Mother   . Heart failure Father        rheumatic heart disease, also drinking and smoking  . Alcoholism Father        died age 42  . Colon cancer Neg Hx       Prior to Admission medications   Medication Sig Start Date End Date Taking? Authorizing Provider  calcium carbonate (TUMS - DOSED IN MG ELEMENTAL CALCIUM) 500 MG chewable tablet Chew 1 tablet by mouth 3 (three) times daily as needed for indigestion or heartburn.   Yes [provider]  amLODipine (NORVASC) 2.5 MG tablet Take 1  tablet (2.5 mg total) by mouth daily. Patient not taking: Reported on 08/30/2018 06/21/18   Alwyn Ren, MD  colchicine 0.6 MG tablet Take 1 tablet (0.6 mg total) by mouth daily. Patient not taking: Reported on 08/30/2018 08/11/18   Harlene Salts A, PA-C  lidocaine (LIDODERM) 5 % Place 1 patch onto the skin daily. Remove & Discard patch within 12 hours or as directed by MD Patient not taking: Reported on 08/30/2018 08/03/18   Harlene Salts A, PA-C  methocarbamol (ROBAXIN) 500 MG tablet Take 1 tablet (500 mg total) by mouth 2 (two) times daily. Patient not taking: Reported on 08/30/2018 08/03/18   Harlene Salts A, PA-C  sucralfate (CARAFATE) 1 g tablet Take 1 tablet (1 g total) by mouth 4 (four) times daily -  with meals and at bedtime. Patient not taking: Reported on 08/30/2018 08/03/18   Harlene Salts A, PA-C  valACYclovir (VALTREX) 500 MG tablet Take 1 tablet (500 mg total) by mouth 2 (two) times daily. For 3 days with outbreaks Patient not taking: Reported on 08/30/2018 04/09/18   [name], MD    PHYSICAL EXAM   Temp:  [97.9 F (36.6 C)] 97.9 F (36.6 C) (11/17 1354) Pulse Rate:  [83-111] 83 (11/17 1733) Resp:  [17-18] 18 (11/17 1733) BP: (126-142)/(86-94) 126/86 (11/17 1733) SpO2:  [98 %-100 %] 98 % (11/17 1733) Weight:  [99.7 kg] 99.7 kg (11/17 1404)  BP 126/86   Pulse 83   Temp 97.9 F (36.6 C)   Resp 18   Ht 5\' 10"  (1.778 m)   Wt 99.7 kg   SpO2 98%   BMI 31.54 kg/m    GEN obese middle-aged caucasian male; resting comfortably in bed  HEENT NCAT EOM intact PERRL; clear oropharynx, no cervical LAD; moist mucus membranes  JVP estimated 5 cm H2O above RA; no HJR ; no carotid bruits b/l ;  CV regular normal rate; normal S1 and S2; no m/r/g or S3/S4; PMI non displaced; no parasternal heave  RESP CTA b/l; breathing unlabored and symmetric  ABD soft, non distended, +mild discomfort with deep palpation upper and mid epigastrium; +normoactive BS  EXT  warm throughout b/l; no peripheral edema b/l  R foot: mild erythema and tenderness; over R 1st toe lateral hallux valgus; no significant swelling PULSES  DP and radials 2+ intact b/l  SKIN/MSK no rashes or lesions  NEURO/PSYCH AAOx4; no focal deficits   DATA   LABS ON ADMISSION:  Basic Metabolic Panel: Recent Labs  Lab 08/30/18 1607  NA 141  K 3.6  CL 109  CO2 27  GLUCOSE 132*  BUN 26*  CREATININE 1.32*  CALCIUM 8.7*   CBC: Recent Labs  Lab 08/30/18 1607  WBC 7.6  HGB 10.0*  HCT 31.6*  MCV 98.8  PLT 234   Liver Function Tests: Recent Labs  Lab 08/30/18 1607  AST 11*  ALT 13  ALKPHOS 37*  BILITOT 0.6  PROT 6.3*  ALBUMIN 3.6   No results for input(s): LIPASE, AMYLASE in the last 168 hours. No results for input(s): AMMONIA in the last 168 hours. Coagulation:  Lab Results  Component Value Date   INR 0.89 06/18/2018   INR 1.10 11/17/2016   INR 1.02 10/12/2014   No results found for: PTT Lactic Acid, Venous:     Component Value Date/Time   LATICACIDVEN 1.34 06/18/2018 1442   Cardiac Enzymes: No results for input(s): CKTOTAL, CKMB, CKMBINDEX, TROPONINI in the last 168 hours. Urinalysis:    Component Value Date/Time   COLORURINE YELLOW 06/18/2018 1719   APPEARANCEUR CLEAR 06/18/2018 1719   LABSPEC 1.012 06/18/2018 1719   PHURINE 5.0 06/18/2018 1719   GLUCOSEU NEGATIVE 06/18/2018 1719   HGBUR NEGATIVE 06/18/2018 1719   BILIRUBINUR NEGATIVE 06/18/2018 1719   BILIRUBINUR n 11/16/2015 1411   KETONESUR NEGATIVE 06/18/2018 1719   PROTEINUR NEGATIVE 06/18/2018 1719   UROBILINOGEN 1.0 11/16/2015 1411   UROBILINOGEN 0.2 07/06/2014 1630   NITRITE NEGATIVE 06/18/2018 1719   LEUKOCYTESUR TRACE (A) 06/18/2018 1719    BNP (last 3 results) No results for input(s): PROBNP in the last 8760 hours. CBG: No results for input(s): GLUCAP in the last 168 hours.  Radiological Exams on Admission: No results found.  EKG:  Prior EKG on 06/19/2018 reviewed (sinus  tachycardia). Admission EKG pending.   ASSESSMENT AND PLAN   Assessment: Lilyan GilfordBilly Jason Saltzman is a 42 y.o. male with prior hx of UGIB (esophagitis, gastritis, mallory weiss tear), GERD, HCV, HTN, gout, former cocaine and alcohol abuse who presented with melena concerning for recurrent UGIB. Likely precipitated by heavy ibuprofen use during gout flare. Only occasional alcohol use, per patient. GI consulted while in ED -- plan for EGD tomorrow. Mildly tachycardic on initial exam but BP stable. Not yet requiring transfusion at time of this note.   Active Problems:   UGIB (upper gastrointestinal bleed)  Plan:   # Recurrent UGIB bleed suspect 2/2 ibuprofen use > Hb 10 on admission. Hemodynamically stable - GI ([facility]) consulted: plan for EGD tomorrow - maintain 2 large PIVs - start protonix IV gtt - NPO after midnight  - pRBC transfusion goal > 7 - no evidence of cirrhosis (last abd US in 2017) - appreciate GI recs  # Chronic HCV without cirrhosis > HCV quant 130,000s in 2017; no fibrosis/cirrhosis on US (2017) - needs to follow up with outpatient - has not been seen with outpatient GI in > 1 year  # Gout in R 1st toe, recent flare appears to be resolving - advised patient to avoid NSAIDs going forward - would recommend future PCP 1-2 doses of colchicine for acute flares rather than prednisone  # Mild AKI (note: GFR > 60) with Cr 1.32 in setting of UGIB > baseline Cr likely around 1.2  - s/p 500 cc bolus in ED  - repeat BMP in AM    # Hx of HTN  > not on medications due to hypotensive episodes and AKI recently  - monitor, would hold off anti-HTN agents given UGIB  DVT Prophylaxis: SCD Code Status:  Full Code Family Communication: patient and partner (Melissa) at bedside  Disposition Plan: admit to floor for pending EGD tomorrow   Patient contact: Extended Emergency Contact Information Primary Emergency Contact: Gambrill,Debbie Address: [address]          [place]  [id number] [place] Home Phone: [phone] Mobile Phone: [phone] Relation: Mother Secondary Emergency Contact: Jamse Mead Mobile Phone: [phone] Relation: Significant other  Time spent: > 35 mins  Ike Bene, MD [facility] Pager [phone]  If 7PM-7AM, please contact night-coverage [email or web address] Password [facility] 08/30/2018, 6:31 PM

## 2018-08-30 NOTE — ED Triage Notes (Signed)
Pt reports black stools since last Sunday. Pt has hx of esophageal tear x2 years ago. Pt reports feeling bloated and loss of appetite. Pt denies any other related symptoms.

## 2018-08-30 NOTE — ED Notes (Signed)
ED TO INPATIENT HANDOFF REPORT  Name/Age/Gender Gabriel Garcia 42 y.o. male  Code Status    Code Status Orders  (From admission, onward)         Start     Ordered   08/30/18 1827  Full code  Continuous     08/30/18 1828        Code Status History    Date Active Date Inactive Code Status Order ID Comments User Context   06/18/2018 1804 06/20/2018 1413 Full Code [id number]  Alma Friendly, MD Inpatient   11/17/2016 1355 11/20/2016 1504 Full Code [id number]  Radene Gunning, NP ED   04/22/2016 1023 04/23/2016 1622 Full Code [id number]  Elmarie Shiley, MD Inpatient   10/12/2014 1834 10/13/2014 1639 Full Code [id number]  Rama, Venetia Maxon, MD Inpatient   10/06/2014 1917 10/07/2014 1345 Full Code [id number]  Allyne Gee, MD ED      Home/SNF/Other Home  Chief Complaint abdominal pain;black stools  Level of Care/Admitting Diagnosis ED Disposition    ED Disposition Condition [facility]: [facility] [[id number]]  Level of Care: Med-Surg [16]  Diagnosis: UGIB (upper gastrointestinal bleed) [356701]  Admitting Physician: Colbert Ewing [[id number]]  Attending Physician: Colbert Ewing [[id number]]  Estimated length of stay: inpatient only procedure  Certification:: I certify this patient is being admitted for an inpatient-only procedure  PT Class (Do Not Modify): Inpatient [101]  PT Acc Code (Do Not Modify): Private [1]       Medical History Past Medical History:  Diagnosis Date  . Acute duodenal ulcer with hemorrhage 10/12/2014  . GERD (gastroesophageal reflux disease)   . GI bleed   . Hepatitis C   . Hypertension   . 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 Legally blind in right eye, as defined in [place]   . Mesenteric adenitis   . Sliding hiatal hernia   . Substance abuse ([place])    cocaine  . Terminal ileitis ([place])     Allergies No Known Allergies  IV Location/Drains/Wounds Patient Lines/Drains/Airways  Status   Active Line/Drains/Airways    Name:   Placement date:   Placement time:   Site:   Days:   Peripheral IV 08/30/18 Left Antecubital   08/30/18    1742    Antecubital   less than 1          Labs/Imaging Results for orders placed or performed during the hospital encounter of 08/30/18 (from the past 48 hour(s))  Comprehensive metabolic panel     Status: Abnormal   Collection Time: 08/30/18  4:07 PM  Result Value Ref Range   Sodium 141 135 - 145 mmol/L   Potassium 3.6 3.5 - 5.1 mmol/L   Chloride 109 98 - 111 mmol/L   CO2 27 22 - 32 mmol/L   Glucose, Bld 132 (H) 70 - 99 mg/dL   BUN 26 (H) 6 - 20 mg/dL   Creatinine, Ser 1.32 (H) 0.61 - 1.24 mg/dL   Calcium 8.7 (L) 8.9 - 10.3 mg/dL   Total Protein 6.3 (L) 6.5 - 8.1 g/dL   Albumin 3.6 3.5 - 5.0 g/dL   AST 11 (L) 15 - 41 U/L   ALT 13 0 - 44 U/L   Alkaline Phosphatase 37 (L) 38 - 126 U/L   Total Bilirubin 0.6 0.3 - 1.2 mg/dL   GFR calc non Af Amer >60 >60 mL/min   GFR calc Af Amer >60 >  60 mL/min    Comment: (NOTE) The eGFR has been calculated using the CKD EPI equation. This calculation has not been validated in all clinical situations. eGFR's persistently <60 mL/min signify possible Chronic Kidney Disease.    Anion gap 5 5 - 15    Comment: Performed at [facility], [place] [address] [place], [place] [address]  CBC     Status: Abnormal   Collection Time: 08/30/18  4:07 PM  Result Value Ref Range   WBC 7.6 4.0 - 10.5 K/uL   RBC 3.20 (L) 4.22 - 5.81 MIL/uL   Hemoglobin 10.0 (L) 13.0 - 17.0 g/dL   HCT 31.6 (L) 39.0 - 52.0 %   MCV 98.8 80.0 - 100.0 fL   MCH 31.3 26.0 - 34.0 pg   MCHC 31.6 30.0 - 36.0 g/dL   RDW 13.7 11.5 - 15.5 %   Platelets 234 150 - 400 K/uL   nRBC 0.0 0.0 - 0.2 %    Comment: Performed at [facility], [place] [address] [place], [place] [address]  Type and screen [employer]     Status: None   Collection Time: 08/30/18  4:07 PM  Result Value Ref  Range   ABO/RH(D) A POS    Antibody Screen NEG    Sample Expiration      09/02/2018 Performed at [facility], [place] [address], [place], [place] [address]   POC occult blood, ED     Status: Abnormal   Collection Time: 08/30/18  5:00 PM  Result Value Ref Range   Fecal Occult Bld POSITIVE (A) NEGATIVE   No results found. None  Pending Labs Unresulted Labs (From admission, onward)    Start     Ordered   08/31/18 0500  Comprehensive metabolic panel  Tomorrow morning,   R     08/30/18 1828   08/31/18 0500  Protime-INR  Tomorrow morning,   R     08/30/18 1828   08/31/18 0500  CBC  Tomorrow morning,   R     08/30/18 1828   08/31/18 0000  CBC  Once-Timed,   R     08/30/18 1828          Vitals/Pain Today's Vitals   08/30/18 1354 08/30/18 1404 08/30/18 1733  BP: (!) 142/94  126/86  Pulse: (!) 111  83  Resp: 17  18  Temp: 97.9 F (36.6 C)    SpO2: 100%  98%  Weight:  99.7 kg   Height:  '5\' 10"'$  (1.778 m)   PainSc:  0-No pain     Isolation Precautions No active isolations  Medications Medications  pantoprazole (PROTONIX) 80 mg in sodium chloride 0.9 % 250 mL (0.32 mg/mL) infusion (has no administration in time range)  pantoprazole (PROTONIX) injection 40 mg (has no administration in time range)  acetaminophen (TYLENOL) tablet 650 mg (has no administration in time range)  pantoprazole (PROTONIX) injection 40 mg (40 mg Intravenous Given 08/30/18 1743)  lactated ringers bolus 500 mL (500 mLs Intravenous New Bag/Given 08/30/18 1743)    Mobility walks

## 2018-08-30 NOTE — H&P (Addendum)
  History and Physical  Gabriel Garcia MRN:[id number] DOB: 05/01/1976 DOA: 08/30/2018 1645  Referring physician: Curatolo A ([facility] ED) PCP: Hunter, Stephen O, MD   HISTORY   Chief Complaint: melena and tarry stools x 1 week  HPI: Gabriel Garcia is a 42 y.o. male prior hx of UGIB (esophagitis, gastritis, mallory weiss tear), GERD, HCV, HTN, gout, former cocaine and alcohol abuse who presented to [facility] ED after about 1 week of dark ("black") stools. Patient states he had a gout flare in his R big toe x 2 weeks ago and was prescribed prednisone instead of usual colchicine -- from the history and chart review, suggests that patient had AKI in September and his outpatient provider may have been concerned re: renal function. However, the flare and toe pain persisted so patient resorted to taking several days of OTC ibuprofen. He also reports that the past Saturday (8 days ago) he had a "few" alcoholic drinks as well -- denies drinking regularly or binge drinking. Then starting last week Sunday (7 days ago), he noticed that his stools were very dark -- since then he has been having melanotic and/or tarry stools every day. No bright red blood. His significant other also reports that patient has been complaining of feeling bloated and early satiety post-prandially. On day of admission, last BM was in early afternoon and nearly black in color, which prompted patient to come to the ED. Otherwise no complaints.   Last EGD in Feb 2018 in setting of UGIB showed sever esophagitis, acute gastritis, and grade D mallory-weiss tear. He denies having melena/tarry stools since that last scope.    Review of Systems:  +black stools as above; feeling bloated; no abdominal pain; no nausea or vomiting - no fevers/chills - no cough - no chest pain, dyspnea on exertion - no edema, PND, orthopnea - no dysuria, increased urinary frequency - no weight changes Rest of systems reviewed are negative, except as per above  history.   ED course:  Vitals Blood pressure 126/86, pulse 83, temperature 97.9 F (36.6 C), resp. rate 18, height 5' 10" (1.778 m), weight 99.7 kg, SpO2 98 %. Received protonix 40mg IV x 1; LR bolus 500cc x 1.   Past Medical History:  Diagnosis Date  . Acute duodenal ulcer with hemorrhage 10/12/2014  . GERD (gastroesophageal reflux disease)   . GI bleed   . Hepatitis C   . Hypertension   . 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 Legally blind in right eye, as defined in [place]   . Mesenteric adenitis   . Sliding hiatal hernia   . Substance abuse (HCC)    cocaine  . Terminal ileitis (HCC)    Past Surgical History:  Procedure Laterality Date  . ESOPHAGOGASTRODUODENOSCOPY N/A 10/13/2014   Procedure: ESOPHAGOGASTRODUODENOSCOPY (EGD);  Surgeon: Carl E Gessner, MD;  Location: [facility];  Service: Endoscopy;  Laterality: N/A;  . ESOPHAGOGASTRODUODENOSCOPY N/A 11/17/2016   Procedure: ESOPHAGOGASTRODUODENOSCOPY (EGD);  Surgeon: Vincent Schooler, MD;  Location: [facility];  Service: Endoscopy;  Laterality: N/A;  . EYE SURGERY Right   . FOOT SURGERY Right   . LIVER BIOPSY     Dr. Medoff GI    Social History:  reports that he has never smoked. He has never used smokeless tobacco. He reports that he does not drink alcohol or use drugs.  No Known Allergies  Family History  Problem Relation Age of Onset  . Rheum arthritis Mother   .   Hypertension Mother   . Alcoholism Mother   . Heart failure Father        rheumatic heart disease, also drinking and smoking  . Alcoholism Father        died age 36  . Colon cancer Neg Hx       Prior to Admission medications   Medication Sig Start Date End Date Taking? Authorizing Provider  calcium carbonate (TUMS - DOSED IN MG ELEMENTAL CALCIUM) 500 MG chewable tablet Chew 1 tablet by mouth 3 (three) times daily as needed for indigestion or heartburn.   Yes [provider]  amLODipine (NORVASC) 2.5 MG tablet Take 1  tablet (2.5 mg total) by mouth daily. Patient not taking: Reported on 08/30/2018 06/21/18   Mathews, Elizabeth G, MD  colchicine 0.6 MG tablet Take 1 tablet (0.6 mg total) by mouth daily. Patient not taking: Reported on 08/30/2018 08/11/18   Morelli, Brandon A, PA-C  lidocaine (LIDODERM) 5 % Place 1 patch onto the skin daily. Remove & Discard patch within 12 hours or as directed by MD Patient not taking: Reported on 08/30/2018 08/03/18   Morelli, Brandon A, PA-C  methocarbamol (ROBAXIN) 500 MG tablet Take 1 tablet (500 mg total) by mouth 2 (two) times daily. Patient not taking: Reported on 08/30/2018 08/03/18   Morelli, Brandon A, PA-C  sucralfate (CARAFATE) 1 g tablet Take 1 tablet (1 g total) by mouth 4 (four) times daily -  with meals and at bedtime. Patient not taking: Reported on 08/30/2018 08/03/18   Morelli, Brandon A, PA-C  valACYclovir (VALTREX) 500 MG tablet Take 1 tablet (500 mg total) by mouth 2 (two) times daily. For 3 days with outbreaks Patient not taking: Reported on 08/30/2018 04/09/18   Hunter, Stephen O, MD    PHYSICAL EXAM   Temp:  [97.9 F (36.6 C)] 97.9 F (36.6 C) (11/17 1354) Pulse Rate:  [83-111] 83 (11/17 1733) Resp:  [17-18] 18 (11/17 1733) BP: (126-142)/(86-94) 126/86 (11/17 1733) SpO2:  [98 %-100 %] 98 % (11/17 1733) Weight:  [99.7 kg] 99.7 kg (11/17 1404)  BP 126/86   Pulse 83   Temp 97.9 F (36.6 C)   Resp 18   Ht 5' 10" (1.778 m)   Wt 99.7 kg   SpO2 98%   BMI 31.54 kg/m    GEN obese middle-aged caucasian male; resting comfortably in bed  HEENT NCAT EOM intact PERRL; clear oropharynx, no cervical LAD; moist mucus membranes  JVP estimated 5 cm H2O above RA; no HJR ; no carotid bruits b/l ;  CV regular normal rate; normal S1 and S2; no m/r/g or S3/S4; PMI non displaced; no parasternal heave  RESP CTA b/l; breathing unlabored and symmetric  ABD soft, non distended, +mild discomfort with deep palpation upper and mid epigastrium; +normoactive BS  EXT  warm throughout b/l; no peripheral edema b/l  R foot: mild erythema and tenderness; over R 1st toe lateral hallux valgus; no significant swelling PULSES  DP and radials 2+ intact b/l  SKIN/MSK no rashes or lesions  NEURO/PSYCH AAOx4; no focal deficits   DATA   LABS ON ADMISSION:  Basic Metabolic Panel: Recent Labs  Lab 08/30/18 1607  NA 141  K 3.6  CL 109  CO2 27  GLUCOSE 132*  BUN 26*  CREATININE 1.32*  CALCIUM 8.7*   CBC: Recent Labs  Lab 08/30/18 1607  WBC 7.6  HGB 10.0*  HCT 31.6*  MCV 98.8  PLT 234   Liver Function Tests: Recent Labs    Lab 08/30/18 1607  AST 11*  ALT 13  ALKPHOS 37*  BILITOT 0.6  PROT 6.3*  ALBUMIN 3.6   No results for input(s): LIPASE, AMYLASE in the last 168 hours. No results for input(s): AMMONIA in the last 168 hours. Coagulation:  Lab Results  Component Value Date   INR 0.89 06/18/2018   INR 1.10 11/17/2016   INR 1.02 10/12/2014   No results found for: PTT Lactic Acid, Venous:     Component Value Date/Time   LATICACIDVEN 1.34 06/18/2018 1442   Cardiac Enzymes: No results for input(s): CKTOTAL, CKMB, CKMBINDEX, TROPONINI in the last 168 hours. Urinalysis:    Component Value Date/Time   COLORURINE YELLOW 06/18/2018 1719   APPEARANCEUR CLEAR 06/18/2018 1719   LABSPEC 1.012 06/18/2018 1719   PHURINE 5.0 06/18/2018 1719   GLUCOSEU NEGATIVE 06/18/2018 1719   HGBUR NEGATIVE 06/18/2018 1719   BILIRUBINUR NEGATIVE 06/18/2018 1719   BILIRUBINUR n 11/16/2015 1411   KETONESUR NEGATIVE 06/18/2018 1719   PROTEINUR NEGATIVE 06/18/2018 1719   UROBILINOGEN 1.0 11/16/2015 1411   UROBILINOGEN 0.2 07/06/2014 1630   NITRITE NEGATIVE 06/18/2018 1719   LEUKOCYTESUR TRACE (A) 06/18/2018 1719    BNP (last 3 results) No results for input(s): PROBNP in the last 8760 hours. CBG: No results for input(s): GLUCAP in the last 168 hours.  Radiological Exams on Admission: No results found.  EKG:  Prior EKG on 06/19/2018 reviewed (sinus  tachycardia). Admission EKG pending.   ASSESSMENT AND PLAN   Assessment: Gabriel Garcia is a 42 y.o. male with prior hx of UGIB (esophagitis, gastritis, mallory weiss tear), GERD, HCV, HTN, gout, former cocaine and alcohol abuse who presented with melena concerning for recurrent UGIB. Likely precipitated by heavy ibuprofen use during gout flare. Only occasional alcohol use, per patient. GI consulted while in ED -- plan for EGD tomorrow. Mildly tachycardic on initial exam but BP stable. Not yet requiring transfusion at time of this note.   Active Problems:   UGIB (upper gastrointestinal bleed)  Plan:   # Recurrent UGIB bleed suspect 2/2 ibuprofen use > Hb 10 on admission. Hemodynamically stable - GI ([facility]) consulted: plan for EGD tomorrow - maintain 2 large PIVs - start protonix IV gtt - NPO after midnight  - pRBC transfusion goal > 7 - no evidence of cirrhosis (last abd US in 2017) - appreciate GI recs  # Chronic HCV without cirrhosis > HCV quant 130,000s in 2017; no fibrosis/cirrhosis on US (2017) - needs to follow up with outpatient - has not been seen with outpatient GI in > 1 year  # Gout in R 1st toe, recent flare appears to be resolving - advised patient to avoid NSAIDs going forward - would recommend future PCP 1-2 doses of colchicine for acute flares rather than prednisone  # Mild AKI (note: GFR > 60) with Cr 1.32 in setting of UGIB > baseline Cr likely around 1.2  - s/p 500 cc bolus in ED  - repeat BMP in AM    # Hx of HTN  > not on medications due to hypotensive episodes and AKI recently  - monitor, would hold off anti-HTN agents given UGIB  DVT Prophylaxis: SCD Code Status:  Full Code Family Communication: patient and partner (Melissa) at bedside  Disposition Plan: admit to floor for pending EGD tomorrow   Patient contact: Extended Emergency Contact Information Primary Emergency Contact: Dirden,Debbie Address: [address]          [place]  [address] [place] Home Phone: [phone] Mobile Phone: [phone] Relation: Mother Secondary Emergency Contact: Bradley, Melissa Mobile Phone: [phone] Relation: Significant other  Time spent: > 35 mins  Necola Bluestein J Alwilda Gilland, MD [facility] Pager [phone]  If 7PM-7AM, please contact night-coverage [email or web address] Password [email or web address] 08/30/2018, 6:31 PM  

## 2018-08-30 NOTE — ED Provider Notes (Signed)
[facility]-EMERGENCY DEPT Provider Note   CSN: [id number] Arrival date & time: 08/30/18  1347     History   Chief Complaint Chief Complaint  Patient presents with  . Rectal Bleeding    HPI Gabriel Garcia is a 42 y.o. male.  The history is provided by the patient.  Rectal Bleeding  Quality:  Black and tarry Amount:  Moderate Duration:  5 days Timing:  Constant Chronicity:  New Context: spontaneously   Context: not hemorrhoids   Similar prior episodes: yes   Relieved by:  Nothing Associated symptoms: no abdominal pain, no dizziness, no epistaxis, no fever, no hematemesis, no light-headedness, no loss of consciousness, no recent illness and no vomiting   Risk factors: liver disease and NSAID use   Risk factors: no anticoagulant use     Past Medical History:  Diagnosis Date  . Acute duodenal ulcer with hemorrhage 10/12/2014  . GERD (gastroesophageal reflux disease)   . GI bleed   . Hepatitis C   . Hypertension   . 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 Legally blind in right eye, as defined in [place]   . Mesenteric adenitis   . Sliding hiatal hernia   . Substance abuse (HCC)    cocaine  . Terminal ileitis [facility])     Patient Active Problem List   Diagnosis Date Noted  . Atypical chest pain 06/18/2018  . Mallory-Weiss tear 12/03/2016  . BPPV (benign paroxysmal positional vertigo) 09/12/2016  . Genital herpes 09/12/2016  . OSA (obstructive sleep apnea) 05/28/2016  . AKI (acute kidney injury) (HCC) 04/22/2016  . Obese 11/17/2015  . Cocaine abuse (HCC) 11/16/2015  . Alcoholism (HCC) 11/16/2015  . Gout 11/16/2015  . Legally blind in right eye, as defined in [place]   . Acute duodenal ulcer with hemorrhage 10/12/2014  . EKG abnormality 10/12/2014  . Hypertension 10/06/2014  . Esophageal reflux 11/24/2013  . Hepatitis C     Past Surgical History:  Procedure Laterality Date  . ESOPHAGOGASTRODUODENOSCOPY N/A 10/13/2014     Procedure: ESOPHAGOGASTRODUODENOSCOPY (EGD);  Surgeon: Iva Boop, MD;  Location: [facility]COPY;  Service: Endoscopy;  Laterality: N/A;  . ESOPHAGOGASTRODUODENOSCOPY N/A 11/17/2016   Procedure: ESOPHAGOGASTRODUODENOSCOPY (EGD);  Surgeon: Charlott Rakes, MD;  Location: [facility] ENDOSCOPY;  Service: Endoscopy;  Laterality: N/A;  . EYE SURGERY Right   . FOOT SURGERY Right   . LIVER BIOPSY     Dr. Kinnie Scales GI        Home Medications    Prior to Admission medications   Medication Sig Start Date End Date Taking? Authorizing Provider  amLODipine (NORVASC) 2.5 MG tablet Take 1 tablet (2.5 mg total) by mouth daily. 06/21/18   Alwyn Ren, MD  cetirizine (ZYRTEC) 10 MG tablet Take 10 mg by mouth daily.    [provider]  colchicine 0.6 MG tablet Take 1 tablet (0.6 mg total) by mouth daily. 08/11/18   Harlene Salts A, PA-C  lidocaine (LIDODERM) 5 % Place 1 patch onto the skin daily. Remove & Discard patch within 12 hours or as directed by MD 08/03/18   Bill Salinas, PA-C  methocarbamol (ROBAXIN) 500 MG tablet Take 1 tablet (500 mg total) by mouth 2 (two) times daily. 08/03/18   Harlene Salts A, PA-C  sucralfate (CARAFATE) 1 g tablet Take 1 tablet (1 g total) by mouth 4 (four) times daily -  with meals and at bedtime. 08/03/18   Bill Salinas, PA-C  valACYclovir (VALTREX) 500 MG tablet Take 1 tablet (500 mg total) by mouth 2 (two) times daily. For 3 days with outbreaks 04/09/18   [name], MD    Family History Family History  Problem Relation Age of Onset  . Rheum arthritis Mother   . Hypertension Mother   . Alcoholism Mother   . Heart failure Father        rheumatic heart disease, also drinking and smoking  . Alcoholism Father        died age 24  . Colon cancer Neg Hx     Social History Social History   Tobacco Use  . Smoking status: Never Smoker  . Smokeless tobacco: Never Used  Substance Use Topics  . Alcohol use: No    Comment: etoh free  since 08/2015.  . Drug use: No    Comment: used cocaine before but has been clean since 08/2015.      Allergies   Patient has no known allergies.   Review of Systems Review of Systems  Constitutional: Negative for chills and fever.  HENT: Negative for ear pain, nosebleeds and sore throat.   Eyes: Negative for pain and visual disturbance.  Respiratory: Negative for cough and shortness of breath.   Cardiovascular: Negative for chest pain and palpitations.  Gastrointestinal: Positive for blood in stool and hematochezia. Negative for abdominal distention, abdominal pain, anal bleeding, constipation, diarrhea, hematemesis, nausea, rectal pain and vomiting.  Genitourinary: Negative for dysuria and hematuria.  Musculoskeletal: Negative for arthralgias and back pain.  Skin: Negative for color change and rash.  Neurological: Negative for dizziness, seizures, loss of consciousness, syncope and light-headedness.  All other systems reviewed and are negative.    Physical Exam Updated Vital Signs  ED Triage Vitals  Enc Vitals Group     BP 08/30/18 1354 (!) 142/94     Pulse Rate 08/30/18 1354 (!) 111     Resp 08/30/18 1354 17     Temp 08/30/18 1354 97.9 F (36.6 C)     Temp src --      SpO2 08/30/18 1354 100 %     Weight 08/30/18 1404 219 lb 12.8 oz (99.7 kg)     Height 08/30/18 1404 5\' 10"  (1.778 m)     Head Circumference --      Peak Flow --      Pain Score 08/30/18 1404 0     Pain Loc --      Pain Edu? --      Excl. in GC? --     Physical Exam  Constitutional: He is oriented to person, place, and time. He appears well-developed and well-nourished.  HENT:  Head: Normocephalic and atraumatic.  Eyes: Pupils are equal, round, and reactive to light. Conjunctivae and EOM are normal.  Neck: Normal range of motion. Neck supple.  Cardiovascular: Normal rate, regular rhythm, normal heart sounds and intact distal pulses.  No murmur heard. Pulmonary/Chest: Effort normal and breath  sounds normal. No respiratory distress.  Abdominal: Soft. There is no tenderness.  Genitourinary: Rectal exam shows guaiac positive stool (gross melena on exam).  Musculoskeletal: Normal range of motion. He exhibits no edema.  Neurological: He is alert and oriented to person, place, and time.  Skin: Skin is warm and dry.  Psychiatric: He has a normal mood and affect.  Nursing note and vitals reviewed.    ED Treatments / Results  Labs (all labs ordered are listed, but only abnormal results are displayed) Labs Reviewed  COMPREHENSIVE  METABOLIC PANEL - Abnormal; Notable for the following components:      Result Value   Glucose, Bld 132 (*)    BUN 26 (*)    Creatinine, Ser 1.32 (*)    Calcium 8.7 (*)    Total Protein 6.3 (*)    AST 11 (*)    Alkaline Phosphatase 37 (*)    All other components within normal limits  CBC - Abnormal; Notable for the following components:   RBC 3.20 (*)    Hemoglobin 10.0 (*)    HCT 31.6 (*)    All other components within normal limits  POC OCCULT BLOOD, ED - Abnormal; Notable for the following components:   Fecal Occult Bld POSITIVE (*)    All other components within normal limits  POC OCCULT BLOOD, ED  TYPE AND SCREEN    EKG None  Radiology No results found.  Procedures Procedures (including critical care time)  Medications Ordered in ED Medications  lactated ringers bolus 500 mL (500 mLs Intravenous New Bag/Given 08/30/18 1743)  pantoprazole (PROTONIX) injection 40 mg (40 mg Intravenous Given 08/30/18 1743)     Initial Impression / Assessment and Plan / ED Course  I have reviewed the triage vital signs and the nursing notes.  Pertinent labs & imaging results that were available during my care of the patient were reviewed by me and considered in my medical decision making (see chart for details).     Gabriel CheshireBilly Marlowe ShoresJason Garcia is a 42 year old male with history of hepatitis, Mallory-Weiss tear, reflux who presents to the ED with melena.   Patient with unremarkable vitals.  Mild tachycardia.  Patient overall well-appearing.  Has noticed black stools for the last several days.  Had a history of esophageal tear several years ago.  Patient states that he had a recent episode of gout and has been taking a lot of ibuprofen.  Patient is not on any blood thinners.  Has some mild abdominal pain but no tenderness on exam.  Has gross melena on exam.  Hemoglobin is 10 which is down from baseline of around 13 or 14.  Patient with normal blood pressure.  Suspect upper GI bleed.  Patient given IV Protonix.  Type and screen was sent.  Patient given lactated Ringer bolus.  Has mild AKI.  Otherwise no electrolyte abnormalities.  [facility], Dr. Bosie ClosSchooler, was consulted and made aware of the patient will need EGD. Recommend NPO and Q12 protonix. Patient admitted to the hospitalist service for further care.  Remained hemodynamically stable throughout my care.  This chart was dictated using voice recognition software.  Despite best efforts to proofread,  errors can occur which can change the documentation meaning.   Final Clinical Impressions(s) / ED Diagnoses   Final diagnoses:  Gastrointestinal hemorrhage, unspecified gastrointestinal hemorrhage type    ED Discharge Orders    None       Virgina NorfolkCuratolo, Devanny Palecek, DO 08/30/18 1800

## 2018-08-31 DIAGNOSIS — K260 Acute duodenal ulcer with hemorrhage: Secondary | ICD-10-CM | POA: Diagnosis not present

## 2018-08-31 DIAGNOSIS — Z23 Encounter for immunization: Secondary | ICD-10-CM | POA: Diagnosis not present

## 2018-08-31 DIAGNOSIS — K264 Chronic or unspecified duodenal ulcer with hemorrhage: Secondary | ICD-10-CM | POA: Diagnosis not present

## 2018-08-31 DIAGNOSIS — K922 Gastrointestinal hemorrhage, unspecified: Secondary | ICD-10-CM | POA: Diagnosis present

## 2018-08-31 DIAGNOSIS — K210 Gastro-esophageal reflux disease with esophagitis: Secondary | ICD-10-CM | POA: Diagnosis not present

## 2018-08-31 DIAGNOSIS — K921 Melena: Secondary | ICD-10-CM | POA: Diagnosis not present

## 2018-08-31 LAB — COMPREHENSIVE METABOLIC PANEL
ALBUMIN: 3.3 g/dL — AB (ref 3.5–5.0)
ALT: 12 U/L (ref 0–44)
AST: 10 U/L — AB (ref 15–41)
Alkaline Phosphatase: 32 U/L — ABNORMAL LOW (ref 38–126)
Anion gap: 5 (ref 5–15)
BILIRUBIN TOTAL: 0.6 mg/dL (ref 0.3–1.2)
BUN: 23 mg/dL — ABNORMAL HIGH (ref 6–20)
CHLORIDE: 111 mmol/L (ref 98–111)
CO2: 26 mmol/L (ref 22–32)
Calcium: 8.3 mg/dL — ABNORMAL LOW (ref 8.9–10.3)
Creatinine, Ser: 1.07 mg/dL (ref 0.61–1.24)
GFR calc Af Amer: >60 mL/min (ref >60)
GFR calc non Af Amer: >60 mL/min (ref >60)
GLUCOSE: 109 mg/dL — AB (ref 70–99)
POTASSIUM: 3.9 mmol/L (ref 3.5–5.1)
Sodium: 142 mmol/L (ref 135–145)
TOTAL PROTEIN: 5.6 g/dL — AB (ref 6.5–8.1)

## 2018-08-31 LAB — CBC
HEMATOCRIT: 26.9 % — AB (ref 39.0–52.0)
Hemoglobin: 8.4 g/dL — ABNORMAL LOW (ref 13.0–17.0)
MCH: 30 pg (ref 26.0–34.0)
MCHC: 31.2 g/dL (ref 30.0–36.0)
MCV: 96.1 fL (ref 80.0–100.0)
NRBC: 0 % (ref 0.0–0.2)
PLATELETS: 227 10*3/uL (ref 150–400)
RBC: 2.8 MIL/uL — AB (ref 4.22–5.81)
RDW: 13.6 % (ref 11.5–15.5)
WBC: 5.9 10*3/uL (ref 4.0–10.5)

## 2018-08-31 LAB — PROTIME-INR
INR: 0.98
Prothrombin Time: 12.9 seconds (ref 11.4–15.2)

## 2018-08-31 NOTE — H&P (View-Only) (Signed)
[facility] CONSULT Reason for consult: GI bleed Referring Physician: [facility].  PCP: Dr. Garret Reddish.  Primary gastroenterologist: Dr. Rayburn Felt Aswad Gabriel Garcia is an 42 y.o. male.  HPI: He had a flare of gout in his right toe and came to the emergency room.  He has had a history of recent kidney injury and therefore was not given colchicine but was given prednisone.  He continued to have pain and states that he was taking prednisone as well as large quantities of ibuprofen.  He began to have dark stools.  He has not had any bright red blood vomiting etc.  His exam in the emergency room revealed gross melena guaiac positive.Hemoglobin 10.0 on admission has dropped some to 8.4 but he has not passed any further bleeding.  INR is normal.  His LFTs are normal.  He does have a history of alcohol abuse and drug abuse in the past states that he only drinks now occasionally.  He has a history of acute kidney injury.  He has a history of gout. Patient has been followed by Dr. Paulita Fujita for hepatitis C.  He has genotype 1a and was recently treated with Harvoni with good results.  Follow-up with Dr. Paulita Fujita his revealed no detectable hepatitis C virus.  He has been hospitalized in the past for upper GI bleeding due to esophagitis/Mallory-Weiss tear.  He states he is also had a history of ulcers in the past.  Past Medical History:  Diagnosis Date  . Acute duodenal ulcer with hemorrhage 10/12/2014  . GERD (gastroesophageal reflux disease)   . GI bleed   . Hepatitis C   . Hypertension   . 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 Legally blind in right eye, as defined in [place]   . Mesenteric adenitis   . Sliding hiatal hernia   . Substance abuse ([place])    cocaine  . 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 ESOPHAGOGASTRODUODENOSCOPY N/A 10/13/2014   Procedure: ESOPHAGOGASTRODUODENOSCOPY (EGD);  Surgeon: Gatha Mayer, MD;   Location: [facility];  Service: Endoscopy;  Laterality: N/A;  . ESOPHAGOGASTRODUODENOSCOPY N/A 11/17/2016   Procedure: ESOPHAGOGASTRODUODENOSCOPY (EGD);  Surgeon: Wilford Corner, MD;  Location: [facility] ENDOSCOPY;  Service: Endoscopy;  Laterality: N/A;  . EYE SURGERY Right   . FOOT SURGERY Right   . LIVER BIOPSY     Dr. Earlean Shawl GI    Family History  Problem Relation Age of Onset  . Rheum arthritis Mother   . Hypertension Mother   . Alcoholism Mother   . Heart failure Father        rheumatic heart disease, also drinking and smoking  . Alcoholism Father        died age 65  . Colon cancer Neg Hx     Social History:  reports that he has never smoked. He has never used smokeless tobacco. He reports that he does not drink alcohol or use drugs.  Allergies: No Known Allergies  Medications; Prior to Admission medications   Medication Sig Start Date End Date Taking? Authorizing Provider  calcium carbonate (TUMS - DOSED IN MG ELEMENTAL CALCIUM) 500 MG chewable tablet Chew 1 tablet by mouth 3 (three) times daily as needed for indigestion or heartburn.   Yes [provider]  colchicine 0.6 MG tablet Take 1 tablet (0.6 mg total) by mouth daily. Patient not taking: Reported on 08/30/2018 08/11/18   Deliah Boston, PA-C  lidocaine (LIDODERM) 5 % Place 1 patch onto the skin daily. Remove & Discard patch within 12 hours or as directed by MD Patient not taking: Reported on 08/30/2018 08/03/18   Nuala Alpha A, PA-C  methocarbamol (ROBAXIN) 500 MG tablet Take 1 tablet (500 mg total) by mouth 2 (two) times daily. Patient not taking: Reported on 08/30/2018 08/03/18   Nuala Alpha A, PA-C  sucralfate (CARAFATE) 1 g tablet Take 1 tablet (1 g total) by mouth 4 (four) times daily -  with meals and at bedtime. Patient not taking: Reported on 08/30/2018 08/03/18   Nuala Alpha A, PA-C  valACYclovir (VALTREX) 500 MG tablet Take 1 tablet (500 mg total) by mouth 2 (two) times daily. For 3  days with outbreaks Patient not taking: Reported on 08/30/2018 04/09/18   Marin Olp, MD   . Derrill Memo ON 09/03/2018] pantoprazole  40 mg Intravenous Q12H   PRN Meds acetaminophen Results for orders placed or performed during the hospital encounter of 08/30/18 (from the past 48 hour(s))  Comprehensive metabolic panel     Status: Abnormal   Collection Time: 08/30/18  4:07 PM  Result Value Ref Range   Sodium 141 135 - 145 mmol/L   Potassium 3.6 3.5 - 5.1 mmol/L   Chloride 109 98 - 111 mmol/L   CO2 27 22 - 32 mmol/L   Glucose, Bld 132 (H) 70 - 99 mg/dL   BUN 26 (H) 6 - 20 mg/dL   Creatinine, Ser 1.32 (H) 0.61 - 1.24 mg/dL   Calcium 8.7 (L) 8.9 - 10.3 mg/dL   Total Protein 6.3 (L) 6.5 - 8.1 g/dL   Albumin 3.6 3.5 - 5.0 g/dL   AST 11 (L) 15 - 41 U/L   ALT 13 0 - 44 U/L   Alkaline Phosphatase 37 (L) 38 - 126 U/L   Total Bilirubin 0.6 0.3 - 1.2 mg/dL   GFR calc non Af Amer >60 >60 mL/min   GFR calc Af Amer >60 >60 mL/min    Comment: (NOTE) The eGFR has been calculated using the CKD EPI equation. This calculation has not been validated in all clinical situations. eGFR's persistently <60 mL/min signify possible Chronic Kidney Disease.    Anion gap 5 5 - 15    Comment: Performed at [facility], [place] [address], [place], [place] [address]  CBC     Status: Abnormal   Collection Time: 08/30/18  4:07 PM  Result Value Ref Range   WBC 7.6 4.0 - 10.5 K/uL   RBC 3.20 (L) 4.22 - 5.81 MIL/uL   Hemoglobin 10.0 (L) 13.0 - 17.0 g/dL   HCT 31.6 (L) 39.0 - 52.0 %   MCV 98.8 80.0 - 100.0 fL   MCH 31.3 26.0 - 34.0 pg   MCHC 31.6 30.0 - 36.0 g/dL   RDW 13.7 11.5 - 15.5 %   Platelets 234 150 - 400 K/uL   nRBC 0.0 0.0 - 0.2 %    Comment: Performed at [facility], [place] [address] [place], [place] [address]  Type and screen [place]     Status: None   Collection Time: 08/30/18  4:07 PM  Result Value Ref Range   ABO/RH(D) A POS     Antibody Screen NEG    Sample Expiration      09/02/2018 Performed at [facility], [place] [address], [place], [place] [address]   POC occult blood, ED     Status: Abnormal  Collection Time: 08/30/18  5:00 PM  Result Value Ref Range   Fecal Occult Bld POSITIVE (A) NEGATIVE  Comprehensive metabolic panel     Status: Abnormal   Collection Time: 08/31/18  5:32 AM  Result Value Ref Range   Sodium 142 135 - 145 mmol/L   Potassium 3.9 3.5 - 5.1 mmol/L   Chloride 111 98 - 111 mmol/L   CO2 26 22 - 32 mmol/L   Glucose, Bld 109 (H) 70 - 99 mg/dL   BUN 23 (H) 6 - 20 mg/dL   Creatinine, Ser 1.07 0.61 - 1.24 mg/dL   Calcium 8.3 (L) 8.9 - 10.3 mg/dL   Total Protein 5.6 (L) 6.5 - 8.1 g/dL   Albumin 3.3 (L) 3.5 - 5.0 g/dL   AST 10 (L) 15 - 41 U/L   ALT 12 0 - 44 U/L   Alkaline Phosphatase 32 (L) 38 - 126 U/L   Total Bilirubin 0.6 0.3 - 1.2 mg/dL   GFR calc non Af Amer >60 >60 mL/min   GFR calc Af Amer >60 >60 mL/min    Comment: (NOTE) The eGFR has been calculated using the CKD EPI equation. This calculation has not been validated in all clinical situations. eGFR's persistently <60 mL/min signify possible Chronic Kidney Disease.    Anion gap 5 5 - 15    Comment: Performed at [facility], [place] [address] [place], [place] [address]  Protime-INR     Status: None   Collection Time: 08/31/18  5:32 AM  Result Value Ref Range   Prothrombin Time 12.9 11.4 - 15.2 seconds   INR 0.98     Comment: Performed at [facility], [place] [address] [place], [place] [address]  CBC     Status: Abnormal   Collection Time: 08/31/18  5:32 AM  Result Value Ref Range   WBC 5.9 4.0 - 10.5 K/uL   RBC 2.80 (L) 4.22 - 5.81 MIL/uL   Hemoglobin 8.4 (L) 13.0 - 17.0 g/dL   HCT 26.9 (L) 39.0 - 52.0 %   MCV 96.1 80.0 - 100.0 fL   MCH 30.0 26.0 - 34.0 pg   MCHC 31.2 30.0 - 36.0 g/dL   RDW 13.6 11.5 - 15.5 %   Platelets 227 150 - 400 K/uL   nRBC 0.0 0.0 - 0.2 %     Comment: Performed at [facility], [place] [address] [place], [place] [address]    No results found. ROS: Constitutional: No fever or chills HEENT: Negative Cardiovascular: Negative Respiratory: No coughing or trouble breathing. GI: As above GU: Has been urinating well apparently feels that his kidney function is improved. Musculoskeletal: Negative Neuro/Psychiatric: Minimal alcohol use at this time Endocrine/Heme:            Blood pressure 140/80, pulse 74, temperature 98.2 F (36.8 C), temperature source Oral, resp. rate 14, height _0  (1.778 m), weight 98.8 kg, SpO2 98 %.  Physical exam:   General--white male with tattoos in no distress ENT--nonicteric Neck--supple Heart--regular rate and rhythm without murmurs or gallops Lungs--clear Abdomen--soft and nontender Psych--normal   Assessment: 1.  GI bleed.  Probably due to ulcerations from taking ibuprofen.  Seems to be stable at this time. 2.  Gout.  Has been treated with prednisone and treated himself with ibuprofen. 3.  History of acute kidney injury.  9/19 creatinine was up to 2.6 but has returned to 1.07 with hydration. 4.  History of hepatitis C.  Followed by Dr.  Outlaw and has been treated in the past with Harvoni with complete resolution.  Subsequent blood test have shown no detectable hepatitis C virus 5.  History of esophagitis, gastritis, and Mallory-Weiss tear  Plan: 1.  Agree with empiric treatment with Protonix IV twice daily. 2.  We will allow clear liquids today.  Were unable to get him on the schedule to late this afternoon so we will go ahead with EGD at 7:30 in the morning.  Have discussed this with the patient and he is agreeable.   Nancy Fetter 08/31/2018, 10:12 AM   This note was created using voice recognition software and minor errors may Have occurred unintentionally. Pager: [phone] If no answer or after hours call [phone]

## 2018-08-31 NOTE — Progress Notes (Signed)
PROGRESS NOTE    Gabriel Garcia  JYN:[id number] DOB: 09/18/1976 DOA: 08/30/2018 PCP: Shelva MajesticHunter, Stephen O, MD   Brief Narrative: Patient is a 42 year old male with prior history of upper GI bleed (secondary to esophagitis, gastritis, Mallory-Weiss tear) GERD, HCV, hypertension, gout, former cocaine and alcohol abuse who presented to the emergency department with complaints of one-week history of dark stools.  Patient reported to be on prednisone and ibuprofen at home for his right big toe gout flareup.  FOBT came out to be positive.  GI consulted.  Plan for EGD tomorrow.  Assessment & Plan:   Active Problems:   UGIB (upper gastrointestinal bleed)   Upper GI bleed: Hemoglobin of 8.4 today.  Presented with dark stools.  FOBT positive.  On prednisone and ibuprofen for gout flareup at home. Most likely PUD.  GI planning for EGD tomorrow morning.  N.p.o. after midnight Continue Protonix 40 mg IV every 12.  Continue clear liquid diet today.  Chronic hepatitis C without cirrhosis: Treated with Harvoni.Follows with GI Dr. Dulce Sellarutlaw.  Gout: Has recent flareup of right first toe.  Flare appears to be resolving.  Patient was on prednisone and NSAIDs at home.  We have counseled to stop those.  Should use colchicine in the future.  Mild AKI: Resolved with IV fluids.  Hypertension: Not on antihypertensives.  Currently blood pressure stable.    DVT prophylaxis:SCD Code Status: Full Family Communication: None present at the bed side Disposition Plan: Home after EGD   Consultants: GI  Procedures: None  Antimicrobials: None  Subjective: Patient seen and examined the bedside.  Remains hemodynamically stable.  No active issues.  No new episodes of bloody bowel movement or dark stools.Denies any abdominal pain Objective: Vitals:   08/30/18 1730 08/30/18 1733 08/30/18 1941 08/31/18 0540  BP: (!) 144/96 126/86 (!) 153/95 140/80  Pulse: 95 83 86 74  Resp:  18 16 14   Temp:   98.4 F (36.9 C)  98.2 F (36.8 C)  TempSrc:   Oral Oral  SpO2: 100% 98% 100% 98%  Weight:   98.8 kg   Height:   5\' 10"  (1.778 m)     Intake/Output Summary (Last 24 hours) at 08/31/2018 1342 Last data filed at 08/31/2018 0540 Gross per 24 hour  Intake 666.74 ml  Output 900 ml  Net -233.26 ml   Filed Weights   08/30/18 1404 08/30/18 1941  Weight: 99.7 kg 98.8 kg    Examination:  General exam: Appears calm and comfortable ,Not in distress,average built HEENT:PERRL,Oral mucosa moist, Ear/Nose normal on gross exam Respiratory system: Bilateral equal air entry, normal vesicular breath sounds, no wheezes or crackles  Cardiovascular system: S1 & S2 heard, RRR. No JVD, murmurs, rubs, gallops or clicks. No pedal edema. Gastrointestinal system: Abdomen is nondistended, soft and nontender. No organomegaly or masses felt. Normal bowel sounds heard. Central nervous system: Alert and oriented. No focal neurological deficits. Extremities: No edema, no clubbing ,no cyanosis, distal peripheral pulses palpable. Skin: No rashes, lesions or ulcers,no icterus ,no pallor MSK: Normal muscle bulk,tone ,power Psychiatry: Judgement and insight appear normal. Mood & affect appropriate.     Data Reviewed: I have personally reviewed following labs and imaging studies  CBC: Recent Labs  Lab 08/30/18 1607 08/31/18 0532  WBC 7.6 5.9  HGB 10.0* 8.4*  HCT 31.6* 26.9*  MCV 98.8 96.1  PLT 234 227   Basic Metabolic Panel: Recent Labs  Lab 08/30/18 1607 08/31/18 0532  NA 141 142  K 3.6 3.9  CL 109 111  CO2 27 26  GLUCOSE 132* 109*  BUN 26* 23*  CREATININE 1.32* 1.07  CALCIUM 8.7* 8.3*   GFR: Estimated Creatinine Clearance: 106 mL/min (by C-G formula based on SCr of 1.07 mg/dL). Liver Function Tests: Recent Labs  Lab 08/30/18 1607 08/31/18 0532  AST 11* 10*  ALT 13 12  ALKPHOS 37* 32*  BILITOT 0.6 0.6  PROT 6.3* 5.6*  ALBUMIN 3.6 3.3*   No results for input(s): LIPASE, AMYLASE in the last 168  hours. No results for input(s): AMMONIA in the last 168 hours. Coagulation Profile: Recent Labs  Lab 08/31/18 0532  INR 0.98   Cardiac Enzymes: No results for input(s): CKTOTAL, CKMB, CKMBINDEX, TROPONINI in the last 168 hours. BNP (last 3 results) No results for input(s): PROBNP in the last 8760 hours. HbA1C: No results for input(s): HGBA1C in the last 72 hours. CBG: No results for input(s): GLUCAP in the last 168 hours. Lipid Profile: No results for input(s): CHOL, HDL, LDLCALC, TRIG, CHOLHDL, LDLDIRECT in the last 72 hours. Thyroid Function Tests: No results for input(s): TSH, T4TOTAL, FREET4, T3FREE, THYROIDAB in the last 72 hours. Anemia Panel: No results for input(s): VITAMINB12, FOLATE, FERRITIN, TIBC, IRON, RETICCTPCT in the last 72 hours. Sepsis Labs: No results for input(s): PROCALCITON, LATICACIDVEN in the last 168 hours.  No results found for this or any previous visit (from the past 240 hour(s)).       Radiology Studies: No results found.      Scheduled Meds: . [START ON 09/03/2018] pantoprazole  40 mg Intravenous Q12H   Continuous Infusions: . pantoprozole (PROTONIX) infusion 8 mg/hr (08/31/18 0400)     LOS: 1 day    Time spent:35 mins. More than 50% of that time was spent in counseling and/or coordination of care.      Burnadette Pop, MD [facility] Pager [phone]  If 7PM-7AM, please contact night-coverage [email or web address] Password [email or web address] 08/31/2018, 1:42 PM

## 2018-08-31 NOTE — Consult Note (Signed)
[facility] CONSULT Reason for consult: GI bleed Referring Physician: [facility] hospitalist.  PCP: Dr. Garret Reddish.  Primary gastroenterologist: Dr. Rayburn Felt Aswad Gabriel Garcia is an 42 y.o. male.  HPI: He had a flare of gout in his right toe and came to the emergency room.  He has had a history of recent kidney injury and therefore was not given colchicine but was given prednisone.  He continued to have pain and states that he was taking prednisone as well as large quantities of ibuprofen.  He began to have dark stools.  He has not had any bright red blood vomiting etc.  His exam in the emergency room revealed gross melena guaiac positive.Hemoglobin 10.0 on admission has dropped some to 8.4 but he has not passed any further bleeding.  INR is normal.  His LFTs are normal.  He does have a history of alcohol abuse and drug abuse in the past states that he only drinks now occasionally.  He has a history of acute kidney injury.  He has a history of gout. Patient has been followed by Dr. Paulita Fujita for hepatitis C.  He has genotype 1a and was recently treated with Harvoni with good results.  Follow-up with Dr. Paulita Fujita his revealed no detectable hepatitis C virus.  He has been hospitalized in the past for upper GI bleeding due to esophagitis/Mallory-Weiss tear.  He states he is also had a history of ulcers in the past.  Past Medical History:  Diagnosis Date  . Acute duodenal ulcer with hemorrhage 10/12/2014  . GERD (gastroesophageal reflux disease)   . GI bleed   . Hepatitis C   . Hypertension   . 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 Legally blind in right eye, as defined in [place]   . Mesenteric adenitis   . Sliding hiatal hernia   . Substance abuse ([place])    cocaine  . 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 ESOPHAGOGASTRODUODENOSCOPY N/A 10/13/2014   Procedure: ESOPHAGOGASTRODUODENOSCOPY (EGD);  Surgeon: Gatha Mayer, MD;   Location: [facility];  Service: Endoscopy;  Laterality: N/A;  . ESOPHAGOGASTRODUODENOSCOPY N/A 11/17/2016   Procedure: ESOPHAGOGASTRODUODENOSCOPY (EGD);  Surgeon: Wilford Corner, MD;  Location: [facility] ENDOSCOPY;  Service: Endoscopy;  Laterality: N/A;  . EYE SURGERY Right   . FOOT SURGERY Right   . LIVER BIOPSY     Dr. Earlean Shawl GI    Family History  Problem Relation Age of Onset  . Rheum arthritis Mother   . Hypertension Mother   . Alcoholism Mother   . Heart failure Father        rheumatic heart disease, also drinking and smoking  . Alcoholism Father        died age 65  . Colon cancer Neg Hx     Social History:  reports that he has never smoked. He has never used smokeless tobacco. He reports that he does not drink alcohol or use drugs.  Allergies: No Known Allergies  Medications; Prior to Admission medications   Medication Sig Start Date End Date Taking? Authorizing Provider  calcium carbonate (TUMS - DOSED IN MG ELEMENTAL CALCIUM) 500 MG chewable tablet Chew 1 tablet by mouth 3 (three) times daily as needed for indigestion or heartburn.   Yes [provider]  colchicine 0.6 MG tablet Take 1 tablet (0.6 mg total) by mouth daily. Patient not taking: Reported on 08/30/2018 08/11/18   Deliah Boston, PA-C  lidocaine (LIDODERM) 5 % Place 1 patch onto the skin daily. Remove & Discard patch within 12 hours or as directed by MD Patient not taking: Reported on 08/30/2018 08/03/18   Nuala Alpha A, PA-C  methocarbamol (ROBAXIN) 500 MG tablet Take 1 tablet (500 mg total) by mouth 2 (two) times daily. Patient not taking: Reported on 08/30/2018 08/03/18   Nuala Alpha A, PA-C  sucralfate (CARAFATE) 1 g tablet Take 1 tablet (1 g total) by mouth 4 (four) times daily -  with meals and at bedtime. Patient not taking: Reported on 08/30/2018 08/03/18   Nuala Alpha A, PA-C  valACYclovir (VALTREX) 500 MG tablet Take 1 tablet (500 mg total) by mouth 2 (two) times daily. For 3  days with outbreaks Patient not taking: Reported on 08/30/2018 04/09/18   Marin Olp, MD   . Derrill Memo ON 09/03/2018] pantoprazole  40 mg Intravenous Q12H   PRN Meds acetaminophen Results for orders placed or performed during the hospital encounter of 08/30/18 (from the past 48 hour(s))  Comprehensive metabolic panel     Status: Abnormal   Collection Time: 08/30/18  4:07 PM  Result Value Ref Range   Sodium 141 135 - 145 mmol/L   Potassium 3.6 3.5 - 5.1 mmol/L   Chloride 109 98 - 111 mmol/L   CO2 27 22 - 32 mmol/L   Glucose, Bld 132 (H) 70 - 99 mg/dL   BUN 26 (H) 6 - 20 mg/dL   Creatinine, Ser 1.32 (H) 0.61 - 1.24 mg/dL   Calcium 8.7 (L) 8.9 - 10.3 mg/dL   Total Protein 6.3 (L) 6.5 - 8.1 g/dL   Albumin 3.6 3.5 - 5.0 g/dL   AST 11 (L) 15 - 41 U/L   ALT 13 0 - 44 U/L   Alkaline Phosphatase 37 (L) 38 - 126 U/L   Total Bilirubin 0.6 0.3 - 1.2 mg/dL   GFR calc non Af Amer >60 >60 mL/min   GFR calc Af Amer >60 >60 mL/min    Comment: (NOTE) The eGFR has been calculated using the CKD EPI equation. This calculation has not been validated in all clinical situations. eGFR's persistently <60 mL/min signify possible Chronic Kidney Disease.    Anion gap 5 5 - 15    Comment: Performed at [facility], [place] [address], [place], [place] [address]  CBC     Status: Abnormal   Collection Time: 08/30/18  4:07 PM  Result Value Ref Range   WBC 7.6 4.0 - 10.5 K/uL   RBC 3.20 (L) 4.22 - 5.81 MIL/uL   Hemoglobin 10.0 (L) 13.0 - 17.0 g/dL   HCT 31.6 (L) 39.0 - 52.0 %   MCV 98.8 80.0 - 100.0 fL   MCH 31.3 26.0 - 34.0 pg   MCHC 31.6 30.0 - 36.0 g/dL   RDW 13.7 11.5 - 15.5 %   Platelets 234 150 - 400 K/uL   nRBC 0.0 0.0 - 0.2 %    Comment: Performed at [facility], [place] [address] [place], [place] [address]  Type and screen [place]     Status: None   Collection Time: 08/30/18  4:07 PM  Result Value Ref Range   ABO/RH(D) A POS     Antibody Screen NEG    Sample Expiration      09/02/2018 Performed at [facility], [place] [address], [place], [place] [address]   POC occult blood, ED     Status: Abnormal  Collection Time: 08/30/18  5:00 PM  Result Value Ref Range   Fecal Occult Bld POSITIVE (A) NEGATIVE  Comprehensive metabolic panel     Status: Abnormal   Collection Time: 08/31/18  5:32 AM  Result Value Ref Range   Sodium 142 135 - 145 mmol/L   Potassium 3.9 3.5 - 5.1 mmol/L   Chloride 111 98 - 111 mmol/L   CO2 26 22 - 32 mmol/L   Glucose, Bld 109 (H) 70 - 99 mg/dL   BUN 23 (H) 6 - 20 mg/dL   Creatinine, Ser 1.07 0.61 - 1.24 mg/dL   Calcium 8.3 (L) 8.9 - 10.3 mg/dL   Total Protein 5.6 (L) 6.5 - 8.1 g/dL   Albumin 3.3 (L) 3.5 - 5.0 g/dL   AST 10 (L) 15 - 41 U/L   ALT 12 0 - 44 U/L   Alkaline Phosphatase 32 (L) 38 - 126 U/L   Total Bilirubin 0.6 0.3 - 1.2 mg/dL   GFR calc non Af Amer >60 >60 mL/min   GFR calc Af Amer >60 >60 mL/min    Comment: (NOTE) The eGFR has been calculated using the CKD EPI equation. This calculation has not been validated in all clinical situations. eGFR's persistently <60 mL/min signify possible Chronic Kidney Disease.    Anion gap 5 5 - 15    Comment: Performed at [facility], [place] [address] [place], [place] [address]  Protime-INR     Status: None   Collection Time: 08/31/18  5:32 AM  Result Value Ref Range   Prothrombin Time 12.9 11.4 - 15.2 seconds   INR 0.98     Comment: Performed at [facility], [place] [address] [place], [place] [address]  CBC     Status: Abnormal   Collection Time: 08/31/18  5:32 AM  Result Value Ref Range   WBC 5.9 4.0 - 10.5 K/uL   RBC 2.80 (L) 4.22 - 5.81 MIL/uL   Hemoglobin 8.4 (L) 13.0 - 17.0 g/dL   HCT 26.9 (L) 39.0 - 52.0 %   MCV 96.1 80.0 - 100.0 fL   MCH 30.0 26.0 - 34.0 pg   MCHC 31.2 30.0 - 36.0 g/dL   RDW 13.6 11.5 - 15.5 %   Platelets 227 150 - 400 K/uL   nRBC 0.0 0.0 - 0.2 %     Comment: Performed at [facility], [place] [address] [place], [place] [address]    No results found. ROS: Constitutional: No fever or chills HEENT: Negative Cardiovascular: Negative Respiratory: No coughing or trouble breathing. GI: As above GU: Has been urinating well apparently feels that his kidney function is improved. Musculoskeletal: Negative Neuro/Psychiatric: Minimal alcohol use at this time Endocrine/Heme:            Blood pressure 140/80, pulse 74, temperature 98.2 F (36.8 C), temperature source Oral, resp. rate 14, height _0  (1.778 m), weight 98.8 kg, SpO2 98 %.  Physical exam:   General--white male with tattoos in no distress ENT--nonicteric Neck--supple Heart--regular rate and rhythm without murmurs or gallops Lungs--clear Abdomen--soft and nontender Psych--normal   Assessment: 1.  GI bleed.  Probably due to ulcerations from taking ibuprofen.  Seems to be stable at this time. 2.  Gout.  Has been treated with prednisone and treated himself with ibuprofen. 3.  History of acute kidney injury.  9/19 creatinine was up to 2.6 but has returned to 1.07 with hydration. 4.  History of hepatitis C.  Followed by Dr.  Outlaw and has been treated in the past with Harvoni with complete resolution.  Subsequent blood test have shown no detectable hepatitis C virus 5.  History of esophagitis, gastritis, and Mallory-Weiss tear  Plan: 1.  Agree with empiric treatment with Protonix IV twice daily. 2.  We will allow clear liquids today.  Were unable to get him on the schedule to late this afternoon so we will go ahead with EGD at 7:30 in the morning.  Have discussed this with the patient and he is agreeable.   Nancy Fetter 08/31/2018, 10:12 AM   This note was created using voice recognition software and minor errors may Have occurred unintentionally. Pager: [phone] If no answer or after hours call [phone]

## 2018-09-01 ENCOUNTER — Observation Stay (HOSPITAL_COMMUNITY): Payer: 59 | Admitting: Anesthesiology

## 2018-09-01 ENCOUNTER — Encounter (HOSPITAL_COMMUNITY): Payer: Self-pay | Admitting: Anesthesiology

## 2018-09-01 ENCOUNTER — Encounter (HOSPITAL_COMMUNITY)
Admission: EM | Disposition: A | Discharge status: Home / Self Care | Payer: Self-pay | Source: Home / Self Care | Attending: Emergency Medicine

## 2018-09-01 DIAGNOSIS — K922 Gastrointestinal hemorrhage, unspecified: Secondary | ICD-10-CM | POA: Diagnosis not present

## 2018-09-01 DIAGNOSIS — K260 Acute duodenal ulcer with hemorrhage: Secondary | ICD-10-CM | POA: Diagnosis not present

## 2018-09-01 DIAGNOSIS — K264 Chronic or unspecified duodenal ulcer with hemorrhage: Secondary | ICD-10-CM | POA: Diagnosis not present

## 2018-09-01 DIAGNOSIS — K210 Gastro-esophageal reflux disease with esophagitis: Secondary | ICD-10-CM | POA: Diagnosis not present

## 2018-09-01 DIAGNOSIS — K921 Melena: Secondary | ICD-10-CM | POA: Diagnosis not present

## 2018-09-01 HISTORY — PX: ESOPHAGOGASTRODUODENOSCOPY (EGD) WITH PROPOFOL: SHX5813

## 2018-09-01 LAB — CBC WITH DIFFERENTIAL/PLATELET
ABS IMMATURE GRANULOCYTES: 0.02 10*3/uL (ref 0.00–0.07)
BASOS ABS: 0 10*3/uL (ref 0.0–0.1)
BASOS PCT: 1 %
Eosinophils Absolute: 0.1 10*3/uL (ref 0.0–0.5)
Eosinophils Relative: 2 %
HCT: 27.3 % — ABNORMAL LOW (ref 39.0–52.0)
HEMOGLOBIN: 8.4 g/dL — AB (ref 13.0–17.0)
Immature Granulocytes: 0 %
LYMPHS PCT: 33 %
Lymphs Abs: 2 10*3/uL (ref 0.7–4.0)
MCH: 30.1 pg (ref 26.0–34.0)
MCHC: 30.8 g/dL (ref 30.0–36.0)
MCV: 97.8 fL (ref 80.0–100.0)
MONO ABS: 0.3 10*3/uL (ref 0.1–1.0)
Monocytes Relative: 6 %
NEUTROS ABS: 3.5 10*3/uL (ref 1.7–7.7)
NRBC: 0 % (ref 0.0–0.2)
Neutrophils Relative %: 58 %
Platelets: 236 10*3/uL (ref 150–400)
RBC: 2.79 MIL/uL — AB (ref 4.22–5.81)
RDW: 13.8 % (ref 11.5–15.5)
WBC: 6 10*3/uL (ref 4.0–10.5)

## 2018-09-01 SURGERY — ESOPHAGOGASTRODUODENOSCOPY (EGD) WITH PROPOFOL
Anesthesia: Monitor Anesthesia Care

## 2018-09-01 MED ORDER — SODIUM CHLORIDE 0.9 % IV SOLN: INTRAVENOUS | Status: DC

## 2018-09-01 MED ORDER — SODIUM CHLORIDE (PF) 0.9 % IJ SOLN
PREFILLED_SYRINGE | INTRAMUSCULAR | Status: DC | PRN
  Administered 2018-09-01: 1 mL

## 2018-09-01 MED ORDER — ONDANSETRON HCL 4 MG/2ML IJ SOLN
4.0000 mg | Freq: Four times a day (QID) | INTRAMUSCULAR | Status: DC | PRN
  Administered 2018-09-01 (×2): 4 mg via INTRAVENOUS
  Filled 2018-09-01 (×3): qty 2

## 2018-09-01 MED ORDER — INFLUENZA VAC SPLIT QUAD 0.5 ML IM SUSY
0.5000 mL | PREFILLED_SYRINGE | INTRAMUSCULAR | Status: AC
  Administered 2018-09-02: 0.5 mL via INTRAMUSCULAR
  Filled 2018-09-01: qty 0.5

## 2018-09-01 MED ORDER — LIDOCAINE 2% (20 MG/ML) 5 ML SYRINGE
INTRAMUSCULAR | Status: DC | PRN
  Administered 2018-09-01: 100 mg via INTRAVENOUS

## 2018-09-01 MED ORDER — PROPOFOL 10 MG/ML IV BOLUS
INTRAVENOUS | Status: DC | PRN
  Administered 2018-09-01: 30 mg via INTRAVENOUS

## 2018-09-01 MED ORDER — MORPHINE SULFATE (PF) 2 MG/ML IV SOLN
2.0000 mg | INTRAVENOUS | Status: DC | PRN
  Administered 2018-09-01 – 2018-09-02 (×4): 2 mg via INTRAVENOUS
  Filled 2018-09-01 (×4): qty 1

## 2018-09-01 MED ORDER — PROPOFOL 500 MG/50ML IV EMUL
INTRAVENOUS | Status: DC | PRN
  Administered 2018-09-01: 125 ug/kg/min via INTRAVENOUS

## 2018-09-01 MED ORDER — EPINEPHRINE PF 1 MG/10ML IJ SOSY
PREFILLED_SYRINGE | INTRAMUSCULAR | Status: AC
  Filled 2018-09-01: qty 10

## 2018-09-01 MED ORDER — PROPOFOL 10 MG/ML IV BOLUS
INTRAVENOUS | Status: AC
  Filled 2018-09-01: qty 40

## 2018-09-01 MED ORDER — LACTATED RINGERS IV SOLN
INTRAVENOUS | Status: DC
  Administered 2018-09-01: 1000 mL via INTRAVENOUS

## 2018-09-01 MED ORDER — PROPOFOL 10 MG/ML IV BOLUS
INTRAVENOUS | Status: AC
  Filled 2018-09-01: qty 20

## 2018-09-01 MED ORDER — PANTOPRAZOLE SODIUM 40 MG PO TBEC
40.0000 mg | DELAYED_RELEASE_TABLET | Freq: Two times a day (BID) | ORAL | Status: DC
  Administered 2018-09-01 – 2018-09-02 (×2): 40 mg via ORAL
  Filled 2018-09-01 (×2): qty 1

## 2018-09-01 SURGICAL SUPPLY — 15 items

## 2018-09-01 NOTE — Transfer of Care (Signed)
Immediate Anesthesia Transfer of Care Note  Patient: Gabriel Garcia  Procedure(s) Performed: ESOPHAGOGASTRODUODENOSCOPY (EGD) WITH PROPOFOL (N/A ) SCLEROTHERAPY  Patient Location: PACU  Anesthesia Type:MAC  Level of Consciousness: sedated  Airway & Oxygen Therapy: Patient Spontanous Breathing and Patient connected to nasal cannula oxygen  Post-op Assessment: Report given to RN and Post -op Vital signs reviewed and stable  Post vital signs: Reviewed and stable  Last Vitals:  Vitals Value Taken Time  BP    Temp    Pulse    Resp    SpO2      Last Pain:  Vitals:   09/01/18 0705  TempSrc: Oral  PainSc: 0-No pain         Complications: No apparent anesthesia complications

## 2018-09-01 NOTE — Anesthesia Postprocedure Evaluation (Signed)
Anesthesia Post Note  Patient: Gabriel Garcia  Procedure(s) Performed: ESOPHAGOGASTRODUODENOSCOPY (EGD) WITH PROPOFOL (N/A ) SCLEROTHERAPY     Patient location during evaluation: PACU Anesthesia Type: MAC Level of consciousness: awake and alert Pain management: pain level controlled Vital Signs Assessment: post-procedure vital signs reviewed and stable Respiratory status: spontaneous breathing, nonlabored ventilation, respiratory function stable and patient connected to nasal cannula oxygen Cardiovascular status: stable and blood pressure returned to baseline Postop Assessment: no apparent nausea or vomiting Anesthetic complications: no    Last Vitals:  Vitals:   09/01/18 0810 09/01/18 0820  BP: (!) 140/105 (!) 156/102  Pulse: 94 83  Resp: 16 12  Temp:    SpO2: 100% 100%    Last Pain:  Vitals:   09/01/18 0820  TempSrc:   PainSc: 0-No pain                 [name]

## 2018-09-01 NOTE — Anesthesia Preprocedure Evaluation (Addendum)
Anesthesia Evaluation  Patient identified by MRN, date of birth, ID band Patient awake    Reviewed: Allergy & Precautions, NPO status , Patient's Chart, lab work & pertinent test results  Airway Mallampati: III  TM Distance: >3 FB Neck ROM: Full    Dental  (+) Teeth Intact, Dental Advisory Given   Pulmonary sleep apnea ,    breath sounds clear to auscultation       Cardiovascular hypertension,  Rhythm:Regular Rate:Normal     Neuro/Psych PSYCHIATRIC DISORDERS negative neurological ROS     GI/Hepatic hiatal hernia, PUD, GERD  ,(+) Hepatitis -, C  Endo/Other  negative endocrine ROS  Renal/GU      Musculoskeletal negative musculoskeletal ROS (+)   Abdominal Normal abdominal exam  (+)   Peds  Hematology negative hematology ROS (+)   Anesthesia Other Findings   Reproductive/Obstetrics                            Lab Results  Component Value Date   WBC 6.0 09/01/2018   HGB 8.4 (L) 09/01/2018   HCT 27.3 (L) 09/01/2018   MCV 97.8 09/01/2018   PLT 236 09/01/2018   Lab Results  Component Value Date   CREATININE 1.07 08/31/2018   BUN 23 (H) 08/31/2018   NA 142 08/31/2018   K 3.9 08/31/2018   CL 111 08/31/2018   CO2 26 08/31/2018   Lab Results  Component Value Date   INR 0.98 08/31/2018   INR 0.89 06/18/2018   INR 1.10 11/17/2016     EKG: normal EKG, normal sinus rhythm.  Echo: - Left ventricle: The cavity size was normal. Wall thickness was   increased in a pattern of mild LVH. Systolic function was low   normal to mildly reduced. The estimated ejection fraction was in   the range of 50% to 55%. Wall motion was normal; there were no   regional wall motion abnormalities. Features are consistent with   a pseudonormal left ventricular filling pattern, with concomitant   abnormal relaxation and increased filling pressure (grade 2   diastolic dysfunction). - Aortic valve: There was no  stenosis. - Aorta: Mildly dilated aortic root. Aortic root dimension: 39 mm   (ED). - Mitral valve: There was mild regurgitation. - Left atrium: The atrium was mildly dilated. - Right ventricle: The cavity size was normal. Systolic function   was normal. - Pulmonary arteries: No complete TR doppler jet so unable to   estimate PA systolic pressure. - Inferior vena cava: The vessel was normal in size. The   respirophasic diameter changes were in the normal range (>= 50%),   consistent with normal central venous pressure.   Anesthesia Physical Anesthesia Plan  ASA: III  Anesthesia Plan: MAC   Post-op Pain Management:    Induction: Intravenous  PONV Risk Score and Plan: 1 and Propofol infusion  Airway Management Planned: Natural Airway and Nasal Cannula  Additional Equipment: None  Intra-op Plan:   Post-operative Plan:   Informed Consent: I have reviewed the patients History and Physical, chart, labs and discussed the procedure including the risks, benefits and alternatives for the proposed anesthesia with the patient or authorized representative who has indicated his/her understanding and acceptance.     Plan Discussed with: CRNA  Anesthesia Plan Comments:        Anesthesia Quick Evaluation

## 2018-09-01 NOTE — Op Note (Addendum)
[facility] Patient Name: Gabriel [facility] Procedure Date: 09/01/2018 MRN: [id number] Attending MD: Tresea MallJames L Jaylaa Gallion Dr., MD Date of Birth: 10/08/1976 CSN: [id number] Age: 42 Admit Type: Inpatient Procedure:                Upper GI endoscopy with control of bleeding Indications:              Melena Providers:                Fayrene FearingJames L. Randa EvensEdwards Dr., MD, Janae SauceKaren D. Hinson, RN,                            [employer] Par[employer]Janie Billups, Technician, Doreene BurkeBeth Marshall, CRNA Referring MD:              Medicines:                Monitored Anesthesia Care Complications:            No immediate complications. Estimated Blood Loss:     Estimated blood loss was minimal. Procedure:                Pre-Anesthesia Assessment:                           - Prior to the procedure, a History and Physical                            was performed, and patient medications and                            allergies were reviewed. The patient's tolerance of                            previous anesthesia was also reviewed. The risks                            and benefits of the procedure and the sedation                            options and risks were discussed with the patient.                            All questions were answered, and informed consent                            was obtained. Prior Anticoagulants: The patient has                            taken no previous anticoagulant or antiplatelet                            agents. ASA Grade Assessment: III - A patient with                            severe systemic disease. After reviewing the risks  and benefits, the patient was deemed in                            satisfactory condition to undergo the procedure.                           After obtaining informed consent, the endoscope was                            passed under direct vision. Throughout the                            procedure, the patient's blood pressure, pulse, and                            oxygen saturations were monitored continuously. The                            GIF-H190 ([id number]) Olympus adult endoscope was                            introduced through the mouth, and advanced to the                            second part of duodenum. The upper GI endoscopy was                            accomplished without difficulty. The patient                            tolerated the procedure well. Scope In: Scope Out: Findings:      Mildly severe esophagitis with no bleeding was found in the distal       esophagus.      The stomach was normal.      Red blood was found in the duodenal bulb. Area was successfully injected       with 1 mL of a 1:10,000 solution of epinephrine for hemostasis. The       ulcer crater could not clearly be seen and was behind a fold right at       the junction of the bulb and second portion with blood oozing from that       area. After injection of epinephrine the bleeding stopped. Impression:               - Mildly severe reflux esophagitis.                           - Normal stomach.                           - Blood in the duodenal bulb. Injected. Likely from                            small ulcer.                           -  No specimens collected. Moderate Sedation:      See anesthesia note, no moderate sedation. Recommendation:           - Return patient to hospital ward for ongoing care.                           - Use Protonix (pantoprazole) 40 mg PO BID.                           - Clear liquid diet. Procedure Code(s):        --- Professional ---                           [phone], Esophagogastroduodenoscopy, flexible,                            transoral; with control of bleeding, any method Diagnosis Code(s):        --- Professional ---                           K92.2, Gastrointestinal hemorrhage, unspecified                           K21.0, Gastro-esophageal reflux disease with                             esophagitis                           K92.1, Melena (includes Hematochezia) CPT copyright 2018 American Medical Association. All rights reserved. The codes documented in this report are preliminary and upon coder review may  be revised to meet current compliance requirements. Tresea Mall Dr., MD 09/01/2018 8:08:42 AM This report has been signed electronically. Number of Addenda: 0

## 2018-09-01 NOTE — Anesthesia Procedure Notes (Signed)
Date/Time: 09/01/2018 7:34 AM Performed by: Jhonnie GarnerMarshall, Mira Balon M, CRNA Oxygen Delivery Method: Nasal cannula

## 2018-09-01 NOTE — Progress Notes (Signed)
Pt states he tried using the hospital-provided cpap machine/mask but was unable to tolerate it.  Pt brought his machine from home and prefers to use it instead.  Machine plugged into a red outlet.  No frays on cord or obvious defects noted.  Pt feels comfortable placing it on himself when ready for bed.  Pt was advised that RT is available all night if assistance needed.

## 2018-09-01 NOTE — Progress Notes (Signed)
PROGRESS NOTE    Gabriel Garcia  ZOX:[id number] DOB: 04/20/1976 DOA: 08/30/2018 PCP: Shelva MajesticHunter, Stephen O, MD   Brief Narrative: Patient is a 42 year old male with prior history of upper GI bleed (secondary to esophagitis, gastritis, Mallory-Weiss tear) GERD, HCV, hypertension, gout, former cocaine and alcohol abuse who presented to the emergency department with complaints of one-week history of dark stools.  Patient reported to be on prednisone and ibuprofen at home for his right big toe gout flareup.  FOBT came out to be positive.  GI consulted.  Underwent EGD today  Assessment & Plan:   Active Problems:   UGIB (upper gastrointestinal bleed)   Upper GI bleed   Upper GI bleed: Hemoglobin of 8.4 today.  Presented with dark stools.  FOBT positive.  On prednisone and ibuprofen for gout flareup at home. EGD today showed severe esophagitis, blood in the duodenal bulb .Continue Protonix 40 mg PO BID .Continue clear liquid diet today.  Chronic hepatitis C without cirrhosis: Treated with Harvoni.Follows with GI Dr. Dulce Sellarutlaw.  Gout: Has recent flareup of right first toe.  Flare appears to be resolving.  Patient was on prednisone and NSAIDs at home.  We have counseled to stop those.  Should use colchicine in the future.  Mild AKI: Resolved with IV fluids.  Hypertension: Not on antihypertensives.  Currently blood pressure stable.  Abdominal pain/nausea: Continue pain medications and antiemetics.    DVT prophylaxis:SCD Code Status: Full Family Communication: None present at the bed side Disposition Plan: Home likely tomorrow  Consultants: GI  Procedures: None  Antimicrobials: None  Subjective: Patient seen and examined the bedside.   Had EGD this morning.  Complaint of nausea and abdominal pain after the EGD.  Hemodynamically stable.  Objective: Vitals:   09/01/18 0800 09/01/18 0801 09/01/18 0810 09/01/18 0820  BP: (!) 143/75 (!) 143/75 (!) 140/105 (!) 156/102  Pulse: 99 92 94  83  Resp: (!) 23 19 16 12   Temp:  98.1 F (36.7 C)    TempSrc:  Oral    SpO2: 100% 100% 100% 100%  Weight:      Height:        Intake/Output Summary (Last 24 hours) at 09/01/2018 1258 Last data filed at 09/01/2018 0900 Gross per 24 hour  Intake 572.89 ml  Output 0 ml  Net 572.89 ml   Filed Weights   08/30/18 1404 08/30/18 1941 09/01/18 0705  Weight: 99.7 kg 98.8 kg 98.8 kg    Examination:  General exam: Appears calm and comfortable ,Not in distress,average built HEENT:PERRL,Oral mucosa moist, Ear/Nose normal on gross exam Respiratory system: Bilateral equal air entry, normal vesicular breath sounds, no wheezes or crackles  Cardiovascular system: S1 & S2 heard, RRR. No JVD, murmurs, rubs, gallops or clicks. Gastrointestinal system: Abdomen is nondistended, soft and nontender. No organomegaly or masses felt. Normal bowel sounds heard. Central nervous system: Alert and oriented. No focal neurological deficits. Extremities: No edema, no clubbing ,no cyanosis, distal peripheral pulses palpable. Skin: No rashes, lesions or ulcers,no icterus ,no pallor MSK: Normal muscle bulk,tone ,power Psychiatry: Judgement and insight appear normal. Mood & affect appropriate.     Data Reviewed: I have personally reviewed following labs and imaging studies  CBC: Recent Labs  Lab 08/30/18 1607 08/31/18 0532 09/01/18 0535  WBC 7.6 5.9 6.0  NEUTROABS  --   --  3.5  HGB 10.0* 8.4* 8.4*  HCT 31.6* 26.9* 27.3*  MCV 98.8 96.1 97.8  PLT 234 227 236   Basic Metabolic Panel: Recent  Labs  Lab 08/30/18 1607 08/31/18 0532  NA 141 142  K 3.6 3.9  CL 109 111  CO2 27 26  GLUCOSE 132* 109*  BUN 26* 23*  CREATININE 1.32* 1.07  CALCIUM 8.7* 8.3*   GFR: Estimated Creatinine Clearance: 106 mL/min (by C-G formula based on SCr of 1.07 mg/dL). Liver Function Tests: Recent Labs  Lab 08/30/18 1607 08/31/18 0532  AST 11* 10*  ALT 13 12  ALKPHOS 37* 32*  BILITOT 0.6 0.6  PROT 6.3* 5.6*    ALBUMIN 3.6 3.3*   No results for input(s): LIPASE, AMYLASE in the last 168 hours. No results for input(s): AMMONIA in the last 168 hours. Coagulation Profile: Recent Labs  Lab 08/31/18 0532  INR 0.98   Cardiac Enzymes: No results for input(s): CKTOTAL, CKMB, CKMBINDEX, TROPONINI in the last 168 hours. BNP (last 3 results) No results for input(s): PROBNP in the last 8760 hours. HbA1C: No results for input(s): HGBA1C in the last 72 hours. CBG: No results for input(s): GLUCAP in the last 168 hours. Lipid Profile: No results for input(s): CHOL, HDL, LDLCALC, TRIG, CHOLHDL, LDLDIRECT in the last 72 hours. Thyroid Function Tests: No results for input(s): TSH, T4TOTAL, FREET4, T3FREE, THYROIDAB in the last 72 hours. Anemia Panel: No results for input(s): VITAMINB12, FOLATE, FERRITIN, TIBC, IRON, RETICCTPCT in the last 72 hours. Sepsis Labs: No results for input(s): PROCALCITON, LATICACIDVEN in the last 168 hours.  No results found for this or any previous visit (from the past 240 hour(s)).       Radiology Studies: No results found.      Scheduled Meds: . pantoprazole  40 mg Oral BID   Continuous Infusions:    LOS: 1 day    Time spent:25 mins. More than 50% of that time was spent in counseling and/or coordination of care.      Burnadette Pop, MD [facility] Pager [phone]  If 7PM-7AM, please contact night-coverage [email or web address] Password [email or web address] 09/01/2018, 12:58 PM

## 2018-09-01 NOTE — Interval H&P Note (Signed)
History and Physical Interval Note:  09/01/2018 7:28 AM  Gabriel Garcia  has presented today for surgery, with the diagnosis of GI bleed  The various methods of treatment have been discussed with the patient and family. After consideration of risks, benefits and other options for treatment, the patient has consented to  Procedure(s): ESOPHAGOGASTRODUODENOSCOPY (EGD) WITH PROPOFOL (N/A) as a surgical intervention .  The patient's history has been reviewed, patient examined, no change in status, stable for surgery.  I have reviewed the patient's chart and labs.  Questions were answered to the patient's satisfaction.     Tresea MallJames L Yunus Stoklosa

## 2018-09-02 ENCOUNTER — Encounter (HOSPITAL_COMMUNITY): Payer: Self-pay | Admitting: Gastroenterology

## 2018-09-02 DIAGNOSIS — K921 Melena: Secondary | ICD-10-CM | POA: Diagnosis not present

## 2018-09-02 DIAGNOSIS — K260 Acute duodenal ulcer with hemorrhage: Secondary | ICD-10-CM | POA: Diagnosis not present

## 2018-09-02 DIAGNOSIS — K922 Gastrointestinal hemorrhage, unspecified: Secondary | ICD-10-CM | POA: Diagnosis not present

## 2018-09-02 LAB — CBC WITH DIFFERENTIAL/PLATELET
ABS IMMATURE GRANULOCYTES: 0.02 10*3/uL (ref 0.00–0.07)
BASOS PCT: 1 %
Basophils Absolute: 0 10*3/uL (ref 0.0–0.1)
Eosinophils Absolute: 0.1 10*3/uL (ref 0.0–0.5)
Eosinophils Relative: 2 %
HCT: 26.7 % — ABNORMAL LOW (ref 39.0–52.0)
Hemoglobin: 8.3 g/dL — ABNORMAL LOW (ref 13.0–17.0)
IMMATURE GRANULOCYTES: 0 %
Lymphocytes Relative: 40 %
Lymphs Abs: 2.2 10*3/uL (ref 0.7–4.0)
MCH: 30.1 pg (ref 26.0–34.0)
MCHC: 31.1 g/dL (ref 30.0–36.0)
MCV: 96.7 fL (ref 80.0–100.0)
Monocytes Absolute: 0.3 10*3/uL (ref 0.1–1.0)
Monocytes Relative: 5 %
NEUTROS ABS: 2.9 10*3/uL (ref 1.7–7.7)
NEUTROS PCT: 52 %
PLATELETS: 232 10*3/uL (ref 150–400)
RBC: 2.76 MIL/uL — ABNORMAL LOW (ref 4.22–5.81)
RDW: 13.8 % (ref 11.5–15.5)
WBC: 5.5 10*3/uL (ref 4.0–10.5)
nRBC: 0 % (ref 0.0–0.2)

## 2018-09-02 MED ORDER — PANTOPRAZOLE SODIUM 40 MG PO TBEC: 40.0000 mg | DELAYED_RELEASE_TABLET | Freq: Two times a day (BID) | ORAL | 0 refills | Status: DC

## 2018-09-02 NOTE — Progress Notes (Signed)
[facility] PROGRESS NOTE Subjective Patient without any further stool.  No abdominal pain.  Hemoglobin remained stable  Objective: Vital signs in last 24 hours: Temp:  [98 F (36.7 C)-98.2 F (36.8 C)] 98.2 F (36.8 C) (11/20 0603) Pulse Rate:  [75-84] 84 (11/20 0603) Resp:  [12-18] 18 (11/20 0603) BP: (129-156)/(88-102) 129/88 (11/20 0603) SpO2:  [99 %-100 %] 100 % (11/20 0603) Last BM Date: 08/31/18  Intake/Output from previous day: 11/19 0701 - 11/20 0700 In: 1470.4 [P.O.:480; I.V.:990.4] Out: 0  Intake/Output this shift: No intake/output data recorded.  PE:  Abdomen--soft and completely nontender  Lab Results: Recent Labs    08/30/18 1607 08/31/18 0532 09/01/18 0535 09/02/18 0357  WBC 7.6 5.9 6.0 5.5  HGB 10.0* 8.4* 8.4* 8.3*  HCT 31.6* 26.9* 27.3* 26.7*  PLT 234 227 236 232   BMET Recent Labs    08/30/18 1607 08/31/18 0532  NA 141 142  K 3.6 3.9  CL 109 111  CO2 27 26  CREATININE 1.32* 1.07   LFT Recent Labs    08/30/18 1607 08/31/18 0532  PROT 6.3* 5.6*  AST 11* 10*  ALT 13 12  ALKPHOS 37* 32*  BILITOT 0.6 0.6   PT/INR Recent Labs    08/31/18 0532  LABPROT 12.9  INR 0.98   PANCREAS No results for input(s): LIPASE in the last 72 hours.       Studies/Results: No results found.  Medications: I have reviewed the patient's current medications.  Assessment:   1.  Duodenal ulcer.  It appears that injection has stopped the bleeding hemoglobin is stable.   Plan: We will advance to soft diet.  If hemoglobin is stable in the morning would discharge on Protonix twice daily with follow-up in our office in 2 to 3 weeks.   Tresea MallJames L Gracen Ringwald 09/02/2018, 8:12 AM  This note was created using voice recognition software. Minor errors may Have occurred unintentionally.  Pager: [phone] If no answer or after hours call [phone]

## 2018-09-02 NOTE — Discharge Summary (Signed)
Physician Discharge Summary  Gabriel Garcia ZOX:[id number] DOB: 1976/07/08 DOA: 08/30/2018  PCP: [name], MD  Admit date: 08/30/2018 Discharge date: 09/02/2018  Admitted From: Home Disposition:  Home  Discharge Condition:Stable CODE STATUS:FULL Diet recommendation: Soft diet for next 2-3 days  Brief/Interim Summary: Patient is a 42 year old male with prior history of upper GI bleed (secondary to esophagitis, gastritis, Mallory-Weiss tear) GERD, HCV, hypertension, gout, former cocaine and alcohol abuse who presented to the emergency department with complaints of one-week history of dark stools.  Patient reported to be on prednisone and ibuprofen at home for his right big toe gout flareup.  FOBT came out to be positive.  GI consulted. Underwent EGD showed severe esophagitis, blood in the duodenal bulb.  Epinephrine injection given.  Currently he is hemodynamically stable.  His hemoglobin is stable at 8.3 today.  He is stable for discharge to home.  He will follow-up with his PCP and GI as an outpatient.  Following problems were addressed during his hospitalization:   Upper GI bleed: Hemoglobin of 8.3 today.  Presented with dark stools.  FOBT was positive.  On prednisone and ibuprofen for gout flareup at home. EGD  showed severe esophagitis, blood in the duodenal bulb .Continue Protonix 40 mg PO BID .Continue soft diet for next 2-3 days.  Follow-up with GI in 2 weeks.  Follow-up with PCP to do a CBC test in a week.  Chronic hepatitis C without cirrhosis: Treated with Harvoni.Follows with GI Dr. Dulce Sellar.  Gout: Has recent flareup of right first toe.  Flare appears to be resolving.  Patient was on prednisone and NSAIDs at home.  We have counseled to stop those.  Should use colchicine in the future.  Mild AKI: Resolved with IV fluids.  Hypertension: Not on antihypertensives.  Currently blood pressure stable.  Abdominal pain/nausea: Resolved   Discharge Diagnoses:   Active Problems:   UGIB (upper gastrointestinal bleed)   Upper GI bleed    Discharge Instructions  Discharge Instructions    Diet general   Complete by:  As directed    Take soft diet for next 2-3 days.   Discharge instructions   Complete by:  As directed    1) Please take prescribed medication as instructed.  Take soft diet for next 2 to 3 days. 2)If you see any dark stool or bloody stools please come to the emergency department.  Follow-up with your PCP in a week and do a CBC test during the follow-up. 3)Follow up with gastroenterology as an outpatient in 2 weeks.  Name and number the provider has been attached.   Increase activity slowly   Complete by:  As directed      Allergies as of 09/02/2018   No Known Allergies     Medication List    TAKE these medications   calcium carbonate 500 MG chewable tablet Commonly known as:  TUMS - dosed in mg elemental calcium Chew 1 tablet by mouth 3 (three) times daily as needed for indigestion or heartburn.   colchicine 0.6 MG tablet Take 1 tablet (0.6 mg total) by mouth daily.   lidocaine 5 % Commonly known as:  LIDODERM Place 1 patch onto the skin daily. Remove & Discard patch within 12 hours or as directed by MD   methocarbamol 500 MG tablet Commonly known as:  ROBAXIN Take 1 tablet (500 mg total) by mouth 2 (two) times daily.   pantoprazole 40 MG tablet Commonly known as:  PROTONIX Take 1 tablet (40 mg  total) by mouth 2 (two) times daily.   sucralfate 1 g tablet Commonly known as:  CARAFATE Take 1 tablet (1 g total) by mouth 4 (four) times daily -  with meals and at bedtime.   valACYclovir 500 MG tablet Commonly known as:  VALTREX Take 1 tablet (500 mg total) by mouth 2 (two) times daily. For 3 days with outbreaks      Follow-up Information    [name], MD. Schedule an appointment as soon as possible for a visit in 1 week(s).   Specialty:  Family Medicine Contact information: [address]  [place] [address] [phone]        Carman Ching, MD. Schedule an appointment as soon as possible for a visit in 2 week(s).   Specialty:  Gastroenterology Contact information: [address] [place] [address] [phone]          No Known Allergies  Consultations: GI  Procedures/Studies:  No results found.    Subjective: Patient seen and examined the bedside this morning.  Hemodynamically stable.  No more episodes of bloody bowel movements or dark stools.  Desperately wants to go home.  Discharge Exam: Vitals:   09/02/18 0603 09/02/18 1000  BP: 129/88 136/88  Pulse: 84 80  Resp: 18 18  Temp: 98.2 F (36.8 C) 98.1 F (36.7 C)  SpO2: 100% 99%   Vitals:   09/01/18 1316 09/01/18 2143 09/02/18 0603 09/02/18 1000  BP: (!) 144/89 130/89 129/88 136/88  Pulse: 75 77 84 80  Resp: 18 18 18 18   Temp: 98 F (36.7 C) 98 F (36.7 C) 98.2 F (36.8 C) 98.1 F (36.7 C)  TempSrc: Oral Oral Oral Oral  SpO2: 99% 99% 100% 99%  Weight:      Height:        General: Pt is alert, awake, not in acute distress Cardiovascular: RRR, S1/S2 +, no rubs, no gallops Respiratory: CTA bilaterally, no wheezing, no rhonchi Abdominal: Soft, NT, ND, bowel sounds + Extremities: no edema, no cyanosis    The results of significant diagnostics from this hospitalization (including imaging, microbiology, ancillary and laboratory) are listed below for reference.     Microbiology: No results found for this or any previous visit (from the past 240 hour(s)).   Labs: BNP (last 3 results) Recent Labs    06/18/18 1926  BNP 16.8   Basic Metabolic Panel: Recent Labs  Lab 08/30/18 1607 08/31/18 0532  NA 141 142  K 3.6 3.9  CL 109 111  CO2 27 26  GLUCOSE 132* 109*  BUN 26* 23*  CREATININE 1.32* 1.07  CALCIUM 8.7* 8.3*   Liver Function Tests: Recent Labs  Lab 08/30/18 1607 08/31/18 0532  AST 11* 10*  ALT 13 12  ALKPHOS 37* 32*  BILITOT 0.6 0.6   PROT 6.3* 5.6*  ALBUMIN 3.6 3.3*   No results for input(s): LIPASE, AMYLASE in the last 168 hours. No results for input(s): AMMONIA in the last 168 hours. CBC: Recent Labs  Lab 08/30/18 1607 08/31/18 0532 09/01/18 0535 09/02/18 0357  WBC 7.6 5.9 6.0 5.5  NEUTROABS  --   --  3.5 2.9  HGB 10.0* 8.4* 8.4* 8.3*  HCT 31.6* 26.9* 27.3* 26.7*  MCV 98.8 96.1 97.8 96.7  PLT 234 227 236 232   Cardiac Enzymes: No results for input(s): CKTOTAL, CKMB, CKMBINDEX, TROPONINI in the last 168 hours. BNP: Invalid input(s): POCBNP CBG: No results for input(s): GLUCAP in the last 168 hours.  D-Dimer No results for input(s): DDIMER in the last 72 hours. Hgb A1c No results for input(s): HGBA1C in the last 72 hours. Lipid Profile No results for input(s): CHOL, HDL, LDLCALC, TRIG, CHOLHDL, LDLDIRECT in the last 72 hours. Thyroid function studies No results for input(s): TSH, T4TOTAL, T3FREE, THYROIDAB in the last 72 hours.  Invalid input(s): FREET3 Anemia work up No results for input(s): VITAMINB12, FOLATE, FERRITIN, TIBC, IRON, RETICCTPCT in the last 72 hours. Urinalysis    Component Value Date/Time   COLORURINE YELLOW 06/18/2018 1719   APPEARANCEUR CLEAR 06/18/2018 1719   LABSPEC 1.012 06/18/2018 1719   PHURINE 5.0 06/18/2018 1719   GLUCOSEU NEGATIVE 06/18/2018 1719   HGBUR NEGATIVE 06/18/2018 1719   BILIRUBINUR NEGATIVE 06/18/2018 1719   BILIRUBINUR n 11/16/2015 1411   KETONESUR NEGATIVE 06/18/2018 1719   PROTEINUR NEGATIVE 06/18/2018 1719   UROBILINOGEN 1.0 11/16/2015 1411   UROBILINOGEN 0.2 07/06/2014 1630   NITRITE NEGATIVE 06/18/2018 1719   LEUKOCYTESUR TRACE (A) 06/18/2018 1719   Sepsis Labs Invalid input(s): PROCALCITONIN,  WBC,  LACTICIDVEN Microbiology No results found for this or any previous visit (from the past 240 hour(s)).  Please note: You were cared for by a hospitalist during your hospital stay. Once you are discharged, your primary care physician will handle  any further medical issues. Please note that NO REFILLS for any discharge medications will be authorized once you are discharged, as it is imperative that you return to your primary care physician (or establish a relationship with a primary care physician if you do not have one) for your post hospital discharge needs so that they can reassess your need for medications and monitor your lab values.    Time coordinating discharge: 40 minutes  SIGNED:   Burnadette PopAmrit Chalonda Schlatter, MD  [facility] 09/02/2018, 10:32 AM Pager [phone]  If 7PM-7AM, please contact night-coverage [email or web address] Password [email or web address]

## 2018-09-03 ENCOUNTER — Telehealth: Payer: Self-pay | Admitting: *Deleted

## 2018-09-03 NOTE — Telephone Encounter (Signed)
Left voicemail requesting call back.  

## 2018-09-04 NOTE — Telephone Encounter (Signed)
Left voicemail requesting call back.  

## 2018-09-06 ENCOUNTER — Observation Stay (HOSPITAL_COMMUNITY)
Admission: EM | Admit: 2018-09-06 | Discharge: 2018-09-08 | Disposition: A | Discharge status: Home / Self Care | Payer: 59 | Attending: Family Medicine | Admitting: Family Medicine

## 2018-09-06 ENCOUNTER — Other Ambulatory Visit: Payer: Self-pay

## 2018-09-06 ENCOUNTER — Emergency Department (HOSPITAL_COMMUNITY): Payer: 59

## 2018-09-06 ENCOUNTER — Encounter (HOSPITAL_COMMUNITY): Payer: Self-pay | Admitting: *Deleted

## 2018-09-06 DIAGNOSIS — K921 Melena: Secondary | ICD-10-CM | POA: Insufficient documentation

## 2018-09-06 DIAGNOSIS — I10 Essential (primary) hypertension: Secondary | ICD-10-CM | POA: Diagnosis not present

## 2018-09-06 DIAGNOSIS — B182 Chronic viral hepatitis C: Secondary | ICD-10-CM | POA: Insufficient documentation

## 2018-09-06 DIAGNOSIS — M109 Gout, unspecified: Secondary | ICD-10-CM | POA: Insufficient documentation

## 2018-09-06 DIAGNOSIS — H548 Legal blindness, as defined in USA: Secondary | ICD-10-CM | POA: Diagnosis not present

## 2018-09-06 DIAGNOSIS — Z79899 Other long term (current) drug therapy: Secondary | ICD-10-CM | POA: Diagnosis not present

## 2018-09-06 DIAGNOSIS — K449 Diaphragmatic hernia without obstruction or gangrene: Secondary | ICD-10-CM | POA: Diagnosis not present

## 2018-09-06 DIAGNOSIS — D62 Acute posthemorrhagic anemia: Principal | ICD-10-CM | POA: Insufficient documentation

## 2018-09-06 DIAGNOSIS — N179 Acute kidney failure, unspecified: Secondary | ICD-10-CM | POA: Diagnosis not present

## 2018-09-06 DIAGNOSIS — K319 Disease of stomach and duodenum, unspecified: Secondary | ICD-10-CM | POA: Insufficient documentation

## 2018-09-06 DIAGNOSIS — K210 Gastro-esophageal reflux disease with esophagitis: Secondary | ICD-10-CM | POA: Diagnosis not present

## 2018-09-06 DIAGNOSIS — Q396 Congenital diverticulum of esophagus: Secondary | ICD-10-CM | POA: Diagnosis not present

## 2018-09-06 DIAGNOSIS — D649 Anemia, unspecified: Secondary | ICD-10-CM | POA: Diagnosis present

## 2018-09-06 DIAGNOSIS — K222 Esophageal obstruction: Secondary | ICD-10-CM | POA: Diagnosis not present

## 2018-09-06 DIAGNOSIS — R0602 Shortness of breath: Secondary | ICD-10-CM | POA: Insufficient documentation

## 2018-09-06 LAB — CBC
HCT: 26.1 % — ABNORMAL LOW (ref 39.0–52.0)
Hemoglobin: 8 g/dL — ABNORMAL LOW (ref 13.0–17.0)
MCH: 29.3 pg (ref 26.0–34.0)
MCHC: 30.7 g/dL (ref 30.0–36.0)
MCV: 95.6 fL (ref 80.0–100.0)
NRBC: 0 % (ref 0.0–0.2)
PLATELETS: 363 10*3/uL (ref 150–400)
RBC: 2.73 MIL/uL — ABNORMAL LOW (ref 4.22–5.81)
RDW: 13.9 % (ref 11.5–15.5)
WBC: 6.6 10*3/uL (ref 4.0–10.5)

## 2018-09-06 LAB — COMPREHENSIVE METABOLIC PANEL
ALK PHOS: 40 U/L (ref 38–126)
ALT: 11 U/L (ref 0–44)
AST: 12 U/L — ABNORMAL LOW (ref 15–41)
Albumin: 3.6 g/dL (ref 3.5–5.0)
Anion gap: 6 (ref 5–15)
BUN: 16 mg/dL (ref 6–20)
CHLORIDE: 111 mmol/L (ref 98–111)
CO2: 27 mmol/L (ref 22–32)
CREATININE: 1.54 mg/dL — AB (ref 0.61–1.24)
Calcium: 9.1 mg/dL (ref 8.9–10.3)
GFR calc Af Amer: >60 mL/min (ref >60)
GFR, EST NON AFRICAN AMERICAN: 54 mL/min — AB (ref >60)
Glucose, Bld: 116 mg/dL — ABNORMAL HIGH (ref 70–99)
Potassium: 3.7 mmol/L (ref 3.5–5.1)
SODIUM: 144 mmol/L (ref 135–145)
Total Bilirubin: 0.3 mg/dL (ref 0.3–1.2)
Total Protein: 6.4 g/dL — ABNORMAL LOW (ref 6.5–8.1)

## 2018-09-06 LAB — PREPARE RBC (CROSSMATCH)

## 2018-09-06 MED ORDER — SODIUM CHLORIDE 0.9 % IV SOLN
INTRAVENOUS | Status: DC
  Administered 2018-09-06: 16:00:00 via INTRAVENOUS

## 2018-09-06 MED ORDER — ACETAMINOPHEN 650 MG RE SUPP: 650.0000 mg | Freq: Four times a day (QID) | RECTAL | Status: DC | PRN

## 2018-09-06 MED ORDER — ACETAMINOPHEN 325 MG PO TABS
650.0000 mg | ORAL_TABLET | Freq: Four times a day (QID) | ORAL | Status: DC | PRN
  Filled 2018-09-06: qty 2

## 2018-09-06 MED ORDER — PANTOPRAZOLE SODIUM 40 MG PO TBEC
40.0000 mg | DELAYED_RELEASE_TABLET | Freq: Two times a day (BID) | ORAL | Status: DC
  Administered 2018-09-07 – 2018-09-08 (×3): 40 mg via ORAL
  Filled 2018-09-06 (×3): qty 1

## 2018-09-06 MED ORDER — COLCHICINE 0.6 MG PO TABS
0.6000 mg | ORAL_TABLET | Freq: Once | ORAL | Status: AC
  Administered 2018-09-07: 0.6 mg via ORAL
  Filled 2018-09-06: qty 1

## 2018-09-06 MED ORDER — SODIUM CHLORIDE 0.9 % IV SOLN
80.0000 mg | Freq: Once | INTRAVENOUS | Status: AC
  Administered 2018-09-06: 80 mg via INTRAVENOUS
  Filled 2018-09-06: qty 80

## 2018-09-06 MED ORDER — SUCRALFATE 1 G PO TABS
1.0000 g | ORAL_TABLET | Freq: Three times a day (TID) | ORAL | Status: DC
  Administered 2018-09-06 – 2018-09-07 (×6): 1 g via ORAL
  Filled 2018-09-06 (×7): qty 1

## 2018-09-06 MED ORDER — ONDANSETRON HCL 4 MG/2ML IJ SOLN: 4.0000 mg | Freq: Four times a day (QID) | INTRAMUSCULAR | Status: DC | PRN

## 2018-09-06 MED ORDER — SODIUM CHLORIDE 0.9% IV SOLUTION: Freq: Once | INTRAVENOUS | Status: DC

## 2018-09-06 MED ORDER — ONDANSETRON HCL 4 MG PO TABS: 4.0000 mg | ORAL_TABLET | Freq: Four times a day (QID) | ORAL | Status: DC | PRN

## 2018-09-06 NOTE — H&P (Signed)
                                                                            TRH H&P    Patient Demographics:    Gabriel Garcia, is a 42 y.o. male  MRN: [id number]  DOB - 01/01/1976  Admit Date - 09/06/2018  Referring MD/NP/PA: Kevin Campos  Outpatient Primary MD for the patient is Hunter, Stephen O, MD  Patient coming from: Home  Chief complaint-shortness of breath   HPI:    Gabriel Garcia  is a 42 y.o. 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  Patient says that since he was discharged he has experienced fatigue and worsening shortness of breath especially on walking.  In the ED hemoglobin is stable at 8.0, on the day of discharge 20th November it was 8.3. He denies vomiting blood.  Has noticed black-colored stool. Denies diarrhea.  No abdominal pain. Denies chest pain. No fever or dysuria. In the ED 2 units of PRBC have been ordered by the ED physician. No previous history of stroke or seizures. No history of CAD   Review of systems:    In addition to the HPI above,    All other systems reviewed and are negative.   With Past History of the following :    Past Medical History:  Diagnosis Date  . Acute duodenal ulcer with hemorrhage 10/12/2014  . GERD (gastroesophageal reflux disease)   . GI bleed   . Hepatitis C   . Hypertension   . 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 Legally blind in right eye, as defined in [place]   . Mesenteric adenitis   . Sliding hiatal hernia   . Substance abuse (HCC)    cocaine  . Terminal ileitis (HCC)       Past Surgical History:  Procedure Laterality Date  . ESOPHAGOGASTRODUODENOSCOPY N/A 10/13/2014   Procedure: ESOPHAGOGASTRODUODENOSCOPY (EGD);  Surgeon: Carl E Gessner, MD;  Location: [facility];  Service: Endoscopy;   Laterality: N/A;  . ESOPHAGOGASTRODUODENOSCOPY N/A 11/17/2016   Procedure: ESOPHAGOGASTRODUODENOSCOPY (EGD);  Surgeon: Vincent Schooler, MD;  Location: [facility];  Service: Endoscopy;  Laterality: N/A;  . ESOPHAGOGASTRODUODENOSCOPY (EGD) WITH PROPOFOL N/A 09/01/2018   Procedure: ESOPHAGOGASTRODUODENOSCOPY (EGD) WITH PROPOFOL;  Surgeon: Edwards, James, MD;  Location: [facility];  Service: Endoscopy;  Laterality: N/A;  . EYE SURGERY Right   . FOOT SURGERY Right   . LIVER BIOPSY     Dr. Medoff GI      Social History:      Social History   Tobacco Use  . Smoking status: Never Smoker  . Smokeless tobacco: Never Used  Substance Use Topics  . Alcohol use: No    Comment: etoh free since 08/2015.       Family History :     Family History  Problem Relation Age of Onset  . Rheum arthritis Mother   . Hypertension Mother   . Alcoholism Mother   . Heart failure Father          rheumatic heart disease, also drinking and smoking  . Alcoholism Father        died age 36  . Colon cancer Neg Hx       Home Medications:   Prior to Admission medications   Medication Sig Start Date End Date Taking? Authorizing Provider  calcium carbonate (TUMS - DOSED IN MG ELEMENTAL CALCIUM) 500 MG chewable tablet Chew 1 tablet by mouth 3 (three) times daily as needed for indigestion or heartburn.   Yes [provider]  colchicine 0.6 MG tablet Take 1 tablet (0.6 mg total) by mouth daily. Patient not taking: Reported on 08/30/2018 08/11/18   Morelli, Brandon A, PA-C  lidocaine (LIDODERM) 5 % Place 1 patch onto the skin daily. Remove & Discard patch within 12 hours or as directed by MD Patient not taking: Reported on 08/30/2018 08/03/18   Morelli, Brandon A, PA-C  methocarbamol (ROBAXIN) 500 MG tablet Take 1 tablet (500 mg total) by mouth 2 (two) times daily. Patient not taking: Reported on 08/30/2018 08/03/18   Morelli, Brandon A, PA-C  pantoprazole (PROTONIX) 40 MG tablet Take 1 tablet (40  mg total) by mouth 2 (two) times daily. Patient not taking: Reported on 09/06/2018 09/02/18   Adhikari, Amrit, MD  sucralfate (CARAFATE) 1 g tablet Take 1 tablet (1 g total) by mouth 4 (four) times daily -  with meals and at bedtime. Patient not taking: Reported on 08/30/2018 08/03/18   Morelli, Brandon A, PA-C  valACYclovir (VALTREX) 500 MG tablet Take 1 tablet (500 mg total) by mouth 2 (two) times daily. For 3 days with outbreaks Patient not taking: Reported on 08/30/2018 04/09/18   Hunter, Stephen O, MD     Allergies:    No Known Allergies   Physical Exam:   Vitals  Blood pressure (!) 139/94, pulse 69, temperature 97.8 F (36.6 C), resp. rate 18, SpO2 94 %.  1.  General: Appears in no acute distress  2. Psychiatric:  Intact judgement and  insight, awake alert, oriented x 3.  3. Neurologic: No focal neurological deficits, all cranial nerves intact.Strength 5/5 all 4 extremities, sensation intact all 4 extremities, plantars down going.  4. Eyes :  anicteric sclerae, moist conjunctivae with no lid lag. PERRLA.  5. ENMT:  Oropharynx clear with moist mucous membranes and good dentition  6. Neck:  supple, no cervical lymphadenopathy appriciated, No thyromegaly  7. Respiratory : Normal respiratory effort, good air movement bilaterally,clear to  auscultation bilaterally  8. Cardiovascular : RRR, no gallops, rubs or murmurs, no leg edema  9. Gastrointestinal:  Positive bowel sounds, abdomen soft, non-tender to palpation,no hepatosplenomegaly, no rigidity or guarding       10. Skin:  No cyanosis, normal texture and turgor, no rash, lesions or ulcers  11.Musculoskeletal:  Good muscle tone,  joints appear normal , no effusions,  normal range of motion    Data Review:    CBC Recent Labs  Lab 08/30/18 1607 08/31/18 0532 09/01/18 0535 09/02/18 0357 09/06/18 1233  WBC 7.6 5.9 6.0 5.5 6.6  HGB 10.0* 8.4* 8.4* 8.3* 8.0*  HCT 31.6* 26.9* 27.3* 26.7* 26.1*  PLT 234 227  236 232 363  MCV 98.8 96.1 97.8 96.7 95.6  MCH 31.3 30.0 30.1 30.1 29.3  MCHC 31.6 31.2 30.8 31.1 30.7  RDW 13.7 13.6 13.8 13.8 13.9  LYMPHSABS  --   --  2.0 2.2  --   MONOABS  --   --  0.3 0.3  --   EOSABS  --   --    0.1 0.1  --   BASOSABS  --   --  0.0 0.0  --    ------------------------------------------------------------------------------------------------------------------  Results for orders placed or performed during the hospital encounter of 09/06/18 (from the past 48 hour(s))  Comprehensive metabolic panel     Status: Abnormal   Collection Time: 09/06/18 12:33 PM  Result Value Ref Range   Sodium 144 135 - 145 mmol/L   Potassium 3.7 3.5 - 5.1 mmol/L   Chloride 111 98 - 111 mmol/L   CO2 27 22 - 32 mmol/L   Glucose, Bld 116 (H) 70 - 99 mg/dL   BUN 16 6 - 20 mg/dL   Creatinine, Ser 1.54 (H) 0.61 - 1.24 mg/dL   Calcium 9.1 8.9 - 10.3 mg/dL   Total Protein 6.4 (L) 6.5 - 8.1 g/dL   Albumin 3.6 3.5 - 5.0 g/dL   AST 12 (L) 15 - 41 U/L   ALT 11 0 - 44 U/L   Alkaline Phosphatase 40 38 - 126 U/L   Total Bilirubin 0.3 0.3 - 1.2 mg/dL   GFR calc non Af Amer 54 (L) >60 mL/min   GFR calc Af Amer >60 >60 mL/min    Comment: (NOTE) The eGFR has been calculated using the CKD EPI equation. This calculation has not been validated in all clinical situations. eGFR's persistently <60 mL/min signify possible Chronic Kidney Disease.    Anion gap 6 5 - 15    Comment: Performed at [facility], [address] [place], [place] [address]  CBC     Status: Abnormal   Collection Time: 09/06/18 12:33 PM  Result Value Ref Range   WBC 6.6 4.0 - 10.5 K/uL   RBC 2.73 (L) 4.22 - 5.81 MIL/uL   Hemoglobin 8.0 (L) 13.0 - 17.0 g/dL   HCT 26.1 (L) 39.0 - 52.0 %   MCV 95.6 80.0 - 100.0 fL   MCH 29.3 26.0 - 34.0 pg   MCHC 30.7 30.0 - 36.0 g/dL   RDW 13.9 11.5 - 15.5 %   Platelets 363 150 - 400 K/uL   nRBC 0.0 0.0 - 0.2 %    Comment: Performed at [facility], [address] [place], [place] [address]    Chemistries  Recent Labs  Lab 08/30/18 1607 08/31/18 0532 09/06/18 1233  NA 141 142 144  K 3.6 3.9 3.7  CL 109 111 111  CO2 27 26 27  GLUCOSE 132* 109* 116*  BUN 26* 23* 16  CREATININE 1.32* 1.07 1.54*  CALCIUM 8.7* 8.3* 9.1  AST 11* 10* 12*  ALT 13 12 11  ALKPHOS 37* 32* 40  BILITOT 0.6 0.6 0.3   ------------------------------------------------------------------------------------------------------------------  ------------------------------------------------------------------------------------------------------------------ GFR: Estimated Creatinine Clearance: 73.6 mL/min (A) (by C-G formula based on SCr of 1.54 mg/dL (H)). Liver Function Tests: Recent Labs  Lab 08/30/18 1607 08/31/18 0532 09/06/18 1233  AST 11* 10* 12*  ALT 13 12 11  ALKPHOS 37* 32* 40  BILITOT 0.6 0.6 0.3  PROT 6.3* 5.6* 6.4*  ALBUMIN 3.6 3.3* 3.6   No results for input(s): LIPASE, AMYLASE in the last 168 hours. No results for input(s): AMMONIA in the last 168 hours. Coagulation Profile: Recent Labs  Lab 08/31/18 0532  INR 0.98   Cardiac Enzymes: No results for input(s): CKTOTAL, CKMB, CKMBINDEX, TROPONINI in the last 168 hours. BNP (last 3 results) No results for input(s): PROBNP in the last 8760 hours. HbA1C: No results for input(s): HGBA1C in the last 72 hours. CBG:   No results for input(s): GLUCAP in the last 168 hours. Lipid Profile: No results for input(s): CHOL, HDL, LDLCALC, TRIG, CHOLHDL, LDLDIRECT in the last 72 hours. Thyroid Function Tests: No results for input(s): TSH, T4TOTAL, FREET4, T3FREE, THYROIDAB in the last 72 hours. Anemia Panel: No results for input(s): VITAMINB12, FOLATE, FERRITIN, TIBC, IRON, RETICCTPCT in the last 72 hours.  --------------------------------------------------------------------------------------------------------------- Urine analysis:    Component Value Date/Time   COLORURINE YELLOW 06/18/2018 1719     APPEARANCEUR CLEAR 06/18/2018 1719   LABSPEC 1.012 06/18/2018 1719   PHURINE 5.0 06/18/2018 1719   GLUCOSEU NEGATIVE 06/18/2018 1719   HGBUR NEGATIVE 06/18/2018 1719   BILIRUBINUR NEGATIVE 06/18/2018 1719   BILIRUBINUR n 11/16/2015 1411   KETONESUR NEGATIVE 06/18/2018 1719   PROTEINUR NEGATIVE 06/18/2018 1719   UROBILINOGEN 1.0 11/16/2015 1411   UROBILINOGEN 0.2 07/06/2014 1630   NITRITE NEGATIVE 06/18/2018 1719   LEUKOCYTESUR TRACE (A) 06/18/2018 1719      Imaging Results:    Dg Chest 2 View  Result Date: 09/06/2018 CLINICAL DATA:  41 year old male with history of black stool yesterday. Recent history of GI bleed. Shortness of breath with exertion. EXAM: CHEST - 2 VIEW COMPARISON:  Chest x-ray 06/18/2018. FINDINGS: Lung volumes are normal. No consolidative airspace disease. No pleural effusions. No pneumothorax. No pulmonary nodule or mass noted. Pulmonary vasculature and the cardiomediastinal silhouette are within normal limits. IMPRESSION: No radiographic evidence of acute cardiopulmonary disease. Electronically Signed   By: Vinnie Langton M.D.   On: 09/06/2018 14:01    My personal review of EKG: Rhythm NSR   Assessment & Plan:    Active Problems:   Anemia   1. Symptomatic anemia-patient will be admitted for blood transfusion.  2 units PRBC have been ordered.  Will follow CBC in a.m.  2. Recent upper GI bleed-patient had recent upper GI bleed, status post EGD which showed esophagitis and a small ulcer.  Status post epinephrine injection.  I doubt that patient is still having upper GI bleed.  Hemoglobin has been stable at 8.0 it was 8.3, 4 days ago.  He has an appointment to see GI as outpatient.  In the meantime continue Protonix 40 mg p.o. twice daily.  He already received 80 mg IV Protonix in the ED today.  Continue Carafate.   3. AKI-patient's creatinine is elevated 1.54, patient's creatinine has been elevated over past 3 months and has been fluctuating from as high as  2.0-1.07.  Patient likely has CKD stage III and will need follow-up with nephrology as outpatient.  4. Chronic hepatitis C without cirrhosis-treated with Harvoni.  Follow-up GI as outpatient  5. Hypertension-blood pressure stable, patient is not on any antihypertensive medications   DVT Prophylaxis-   SCDs   AM Labs Ordered, also please review Full Orders  Family Communication: Admission, patients condition and plan of care including tests being ordered have been discussed with the patient and his mother at bedside who indicate understanding and agree with the plan and Code Status.  Code Status: Full code  Admission status: Observation  Time spent in minutes : 60 minutes   Oswald Hillock M.D on 09/06/2018 at 2:44 PM  Between 7am to 7pm - Pager - [phone]. After 7pm go to [email or web address] - password [facility]  [facility] - Office  [phone]

## 2018-09-06 NOTE — ED Triage Notes (Signed)
Pt states he had black stool yesterday. Pt was recently admitted for GI bleed and is concerned he has bleed again. Pt states he has been short of breath with exertion.

## 2018-09-06 NOTE — ED Notes (Signed)
ED TO INPATIENT HANDOFF REPORT  Name/Age/Gender Gabriel Garcia 42 y.o. male  Code Status Code Status History    Date Active Date Inactive Code Status Order ID Comments User Context   08/30/2018 1828 09/02/2018 1428 Full Code [id number]  Colbert Ewing, MD ED   06/18/2018 1804 06/20/2018 1413 Full Code [id number]  Alma Friendly, MD Inpatient   11/17/2016 1355 11/20/2016 1504 Full Code [id number]  Radene Gunning, NP ED   04/22/2016 1023 04/23/2016 1622 Full Code [id number]  Elmarie Shiley, MD Inpatient   10/12/2014 1834 10/13/2014 1639 Full Code [id number]  Rama, Venetia Maxon, MD Inpatient   10/06/2014 1917 10/07/2014 1345 Full Code [id number]  Allyne Gee, MD ED      Home/SNF/Other Home  Chief Complaint bloody stools, SHoB  Level of Care/Admitting Diagnosis ED Disposition    ED Disposition Condition [facility]: [facility] [[id number]]  Level of Care: Med-Surg [16]  Diagnosis: Anemia [564332]  Admitting Physician: Loree Fee  Attending Physician: Oswald Hillock [[id number]]  PT Class (Do Not Modify): Observation [104]  PT Acc Code (Do Not Modify): Observation [10022]       Medical History Past Medical History:  Diagnosis Date  . Acute duodenal ulcer with hemorrhage 10/12/2014  . GERD (gastroesophageal reflux disease)   . GI bleed   . Hepatitis C   . Hypertension   . 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 Legally blind in right eye, as defined in [place]   . Mesenteric adenitis   . Sliding hiatal hernia   . Substance abuse ([place])    cocaine  . Terminal ileitis ([place])     Allergies No Known Allergies  IV Location/Drains/Wounds Patient Lines/Drains/Airways Status   Active Line/Drains/Airways    Name:   Placement date:   Placement time:   Site:   Days:   Peripheral IV 09/06/18 Right;Lateral Antecubital   09/06/18    1226    Antecubital   less than 1          Labs/Imaging Results for orders placed  or performed during the hospital encounter of 09/06/18 (from the past 48 hour(s))  Comprehensive metabolic panel     Status: Abnormal   Collection Time: 09/06/18 12:33 PM  Result Value Ref Range   Sodium 144 135 - 145 mmol/L   Potassium 3.7 3.5 - 5.1 mmol/L   Chloride 111 98 - 111 mmol/L   CO2 27 22 - 32 mmol/L   Glucose, Bld 116 (H) 70 - 99 mg/dL   BUN 16 6 - 20 mg/dL   Creatinine, Ser 1.54 (H) 0.61 - 1.24 mg/dL   Calcium 9.1 8.9 - 10.3 mg/dL   Total Protein 6.4 (L) 6.5 - 8.1 g/dL   Albumin 3.6 3.5 - 5.0 g/dL   AST 12 (L) 15 - 41 U/L   ALT 11 0 - 44 U/L   Alkaline Phosphatase 40 38 - 126 U/L   Total Bilirubin 0.3 0.3 - 1.2 mg/dL   GFR calc non Af Amer 54 (L) >60 mL/min   GFR calc Af Amer >60 >60 mL/min    Comment: (NOTE) The eGFR has been calculated using the CKD EPI equation. This calculation has not been validated in all clinical situations. eGFR's persistently <60 mL/min signify possible Chronic Kidney Disease.    Anion gap 6 5 - 15    Comment: Performed at [facility], [address] [place], [place] [address]  CBC     Status: Abnormal   Collection Time: 09/06/18 12:33 PM  Result Value Ref Range   WBC 6.6 4.0 - 10.5 K/uL   RBC 2.73 (L) 4.22 - 5.81 MIL/uL   Hemoglobin 8.0 (L) 13.0 - 17.0 g/dL   HCT 26.1 (L) 39.0 - 52.0 %   MCV 95.6 80.0 - 100.0 fL   MCH 29.3 26.0 - 34.0 pg   MCHC 30.7 30.0 - 36.0 g/dL   RDW 13.9 11.5 - 15.5 %   Platelets 363 150 - 400 K/uL   nRBC 0.0 0.0 - 0.2 %    Comment: Performed at [facility], [place] [address], [place],  [address]   Dg Chest 2 View  Result Date: 09/06/2018 CLINICAL DATA:  42 year old male with history of black stool yesterday. Recent history of GI bleed. Shortness of breath with exertion. EXAM: CHEST - 2 VIEW COMPARISON:  Chest x-ray 06/18/2018. FINDINGS: Lung volumes are normal. No consolidative airspace disease. No pleural effusions. No pneumothorax. No pulmonary nodule or mass  noted. Pulmonary vasculature and the cardiomediastinal silhouette are within normal limits. IMPRESSION: No radiographic evidence of acute cardiopulmonary disease. Electronically Signed   By: Vinnie Langton M.D.   On: 09/06/2018 14:01   None  Pending Labs Unresulted Labs (From admission, onward)    Start     Ordered   09/06/18 1410  Prepare RBC  (Adult Blood Administration - Red Blood Cells)  Once,   R    Question Answer Comment  # of Units 2 units   Transfusion Indications Symptomatic Anemia   If emergent release call blood bank Not emergent release   Instructions: Transfuse      09/06/18 1409   09/06/18 1230  Type and screen [place]  ONCE - STAT,   STAT    Comments:  [place]    09/06/18 1230          Vitals/Pain Today's Vitals   09/06/18 1201  BP: (!) 140/100  Pulse: 98  Resp: 18  Temp: 97.8 F (36.6 C)  SpO2: 99%    Isolation Precautions No active isolations  Medications Medications  0.9 %  sodium chloride infusion (Manually program via Guardrails IV Fluids) (has no administration in time range)  pantoprazole (PROTONIX) 80 mg in sodium chloride 0.9 % 100 mL IVPB (has no administration in time range)    Mobility walks

## 2018-09-06 NOTE — Plan of Care (Signed)
Patient received from ED; ambulated from stretcher in hallway to bed in room. No complaints voiced at this time. Will continue to monitor.

## 2018-09-07 DIAGNOSIS — D649 Anemia, unspecified: Secondary | ICD-10-CM | POA: Diagnosis not present

## 2018-09-07 DIAGNOSIS — K210 Gastro-esophageal reflux disease with esophagitis: Secondary | ICD-10-CM | POA: Diagnosis not present

## 2018-09-07 LAB — BPAM RBC
BLOOD PRODUCT EXPIRATION DATE: 201912222359
BLOOD PRODUCT EXPIRATION DATE: 201912222359
ISSUE DATE / TIME: 201911241956
ISSUE DATE / TIME: 201911241635
UNIT TYPE AND RH: 6200
UNIT TYPE AND RH: 6200

## 2018-09-07 LAB — CBC
HEMATOCRIT: 29.1 % — AB (ref 39.0–52.0)
HEMOGLOBIN: 8.9 g/dL — AB (ref 13.0–17.0)
MCH: 29 pg (ref 26.0–34.0)
MCHC: 30.6 g/dL (ref 30.0–36.0)
MCV: 94.8 fL (ref 80.0–100.0)
NRBC: 0 % (ref 0.0–0.2)
Platelets: 289 10*3/uL (ref 150–400)
RBC: 3.07 MIL/uL — ABNORMAL LOW (ref 4.22–5.81)
RDW: 14.9 % (ref 11.5–15.5)
WBC: 6.7 10*3/uL (ref 4.0–10.5)

## 2018-09-07 LAB — COMPREHENSIVE METABOLIC PANEL
ALBUMIN: 3.4 g/dL — AB (ref 3.5–5.0)
ALT: 9 U/L (ref 0–44)
AST: 10 U/L — AB (ref 15–41)
Alkaline Phosphatase: 38 U/L (ref 38–126)
Anion gap: 6 (ref 5–15)
BILIRUBIN TOTAL: 0.5 mg/dL (ref 0.3–1.2)
BUN: 13 mg/dL (ref 6–20)
CO2: 26 mmol/L (ref 22–32)
Calcium: 8.4 mg/dL — ABNORMAL LOW (ref 8.9–10.3)
Chloride: 109 mmol/L (ref 98–111)
Creatinine, Ser: 1.23 mg/dL (ref 0.61–1.24)
GFR calc Af Amer: >60 mL/min (ref >60)
GFR calc non Af Amer: >60 mL/min (ref >60)
GLUCOSE: 109 mg/dL — AB (ref 70–99)
POTASSIUM: 3.9 mmol/L (ref 3.5–5.1)
SODIUM: 141 mmol/L (ref 135–145)
TOTAL PROTEIN: 5.8 g/dL — AB (ref 6.5–8.1)

## 2018-09-07 LAB — TYPE AND SCREEN
ABO/RH(D): A POS
Antibody Screen: NEGATIVE
Unit division: 0
Unit division: 0

## 2018-09-07 MED ORDER — HYDROCODONE-ACETAMINOPHEN 5-325 MG PO TABS
2.0000 | ORAL_TABLET | Freq: Once | ORAL | Status: AC
  Administered 2018-09-07: 2 via ORAL
  Filled 2018-09-07: qty 2

## 2018-09-07 MED ORDER — SODIUM CHLORIDE 0.9 % IV SOLN
INTRAVENOUS | Status: DC
  Administered 2018-09-07: 16:00:00 via INTRAVENOUS

## 2018-09-07 MED ORDER — HYDROCODONE-ACETAMINOPHEN 5-325 MG PO TABS
1.0000 | ORAL_TABLET | Freq: Four times a day (QID) | ORAL | Status: DC | PRN
  Administered 2018-09-07: 1 via ORAL
  Filled 2018-09-07: qty 1

## 2018-09-07 MED ORDER — COLCHICINE 0.6 MG PO TABS
0.6000 mg | ORAL_TABLET | Freq: Every day | ORAL | Status: DC
  Administered 2018-09-07 – 2018-09-08 (×2): 0.6 mg via ORAL
  Filled 2018-09-07 (×3): qty 1

## 2018-09-07 MED ORDER — HYDROCODONE-ACETAMINOPHEN 5-325 MG PO TABS
1.0000 | ORAL_TABLET | ORAL | Status: DC | PRN
  Administered 2018-09-08: 1 via ORAL
  Filled 2018-09-07: qty 1

## 2018-09-07 NOTE — Consult Note (Signed)
Referring Provider:  Dr. Drusilla Kanner Primary Care Physician:  [name], MD Primary Gastroenterologist: None (unassigned).  Was seen on recent hospitalization by Dr. Randa Evens.  Reason for Consultation: Recurrent GI bleeding  HPI: Gabriel Garcia is a 42 y.o. male who was discharged from the hospital about a week ago for a GI bleed associated with significant ibuprofen usage for gout, characterized by multiple melenic stools although no sense of weakness or dizziness.    Endoscopy at that time showed a probable bleeding site near the junction of the duodenal bulb and second duodenum, without a discrete ulcer being visualized, with visualization impeded by peptic deformity.    Several days after discharge, despite being on twice daily pantoprazole and 4 times daily sucralfate, the patient began having dark stools and eventually began to feel weak and tingly.  His admission hemoglobin was 8.0, not significantly changed from his discharge hemoglobin of 8.3 four days earlier, but he was apparently volume contracted in that after 2 units of blood, his hemoglobin only went up to 8.9.  Interestingly, the patient's BUN at time of presentation was normal at 16.  Since admission, he has had one bowel movement which was no longer black in color.  He feels well.  The patient had bleeding from what sounds like a Mallory-Weiss tear about 4 years ago.  There is a history of hepatitis C, with F3/F4 fibrosis on hepatic elastography performed 2 years ago, although his ultrasound at that time showed a normal-appearing liver, and a normal-sized spleen, and the patient's current platelet count is well within the normal range and there was no evidence of varices at the time of his recent endoscopy.   Past Medical History:  Diagnosis Date  . Acute duodenal ulcer with hemorrhage 10/12/2014  . GERD (gastroesophageal reflux disease)   . GI bleed   . Hepatitis C   . Hypertension   . Legal blindness of right  eye, as defined in [place] 2004  . Legally blind in right eye, as defined in [place]   . Mesenteric adenitis   . Sliding hiatal hernia   . Substance abuse (HCC)    cocaine  . Terminal ileitis [facility])     Past Surgical History:  Procedure Laterality Date  . ESOPHAGOGASTRODUODENOSCOPY N/A 10/13/2014   Procedure: ESOPHAGOGASTRODUODENOSCOPY (EGD);  Surgeon: Iva Boop, MD;  Location: [facility];  Service: Endoscopy;  Laterality: N/A;  . ESOPHAGOGASTRODUODENOSCOPY N/A 11/17/2016   Procedure: ESOPHAGOGASTRODUODENOSCOPY (EGD);  Surgeon: Charlott Rakes, MD;  [facility] ENDOSCOPY;  Service: Endoscopy;  Laterality: N/A;  . ESOPHAGOGASTRODUODENOSCOPY (EGD) WITH PROPOFOL N/A 09/01/2018   Procedure: ESOPHAGOGASTRODUODENOSCOPY (EGD) WITH PROPOFOL;  Surgeon: Carman Ching, MD;  Location: [facility] ENDOSCOPY;  Service: Endoscopy;  Laterality: N/A;  . EYE SURGERY Right   . FOOT SURGERY Right   . LIVER BIOPSY     Dr. Kinnie Scales GI    Prior to Admission medications   Medication Sig Start Date End Date Taking? Authorizing Provider  calcium carbonate (TUMS - DOSED IN MG ELEMENTAL CALCIUM) 500 MG chewable tablet Chew 1 tablet by mouth 3 (three) times daily as needed for indigestion or heartburn.   Yes [provider]  colchicine 0.6 MG tablet Take 1 tablet (0.6 mg total) by mouth daily. Patient not taking: Reported on 08/30/2018 08/11/18   Harlene Salts A, PA-C  lidocaine (LIDODERM) 5 % Place 1 patch onto the skin daily. Remove & Discard patch within 12 hours or as directed by MD Patient not taking: Reported on 08/30/2018  08/03/18   Harlene SaltsMorelli, Brandon A, PA-C  methocarbamol (ROBAXIN) 500 MG tablet Take 1 tablet (500 mg total) by mouth 2 (two) times daily. Patient not taking: Reported on 08/30/2018 08/03/18   Harlene SaltsMorelli, Brandon A, PA-C  pantoprazole (PROTONIX) 40 MG tablet Take 1 tablet (40 mg total) by mouth 2 (two) times daily. Patient not taking: Reported on 09/06/2018 09/02/18   Burnadette PopAdhikari, Amrit, MD   sucralfate (CARAFATE) 1 g tablet Take 1 tablet (1 g total) by mouth 4 (four) times daily -  with meals and at bedtime. Patient not taking: Reported on 08/30/2018 08/03/18   Harlene SaltsMorelli, Brandon A, PA-C  valACYclovir (VALTREX) 500 MG tablet Take 1 tablet (500 mg total) by mouth 2 (two) times daily. For 3 days with outbreaks Patient not taking: Reported on 08/30/2018 04/09/18   Shelva MajesticHunter, Stephen O, MD    Current Facility-Administered Medications  Medication Dose Route Frequency Provider Last Rate Last Dose  . 0.9 %  sodium chloride infusion (Manually program via Guardrails IV Fluids)   Intravenous Once Meredeth IdeLama, Gagan S, MD   Stopped at 09/06/18 1621  . 0.9 %  sodium chloride infusion   Intravenous Continuous Meredeth IdeLama, Gagan S, MD 10 mL/hr at 09/06/18 1623    . 0.9 %  sodium chloride infusion   Intravenous Continuous Ameah Chanda, MD   Stopped at 09/07/18 1537  . acetaminophen (TYLENOL) tablet 650 mg  650 mg Oral Q6H PRN Meredeth IdeLama, Gagan S, MD       Or  . acetaminophen (TYLENOL) suppository 650 mg  650 mg Rectal Q6H PRN Meredeth IdeLama, Gagan S, MD      . colchicine tablet 0.6 mg  0.6 mg Oral Daily Meredeth IdeLama, Gagan S, MD   0.6 mg at 09/07/18 0900  . HYDROcodone-acetaminophen (NORCO/VICODIN) 5-325 MG per tablet 1 tablet  1 tablet Oral Q6H PRN Meredeth IdeLama, Gagan S, MD   1 tablet at 09/07/18 1841  . ondansetron (ZOFRAN) tablet 4 mg  4 mg Oral Q6H PRN Meredeth IdeLama, Gagan S, MD       Or  . ondansetron (ZOFRAN) injection 4 mg  4 mg Intravenous Q6H PRN Meredeth IdeLama, Gagan S, MD      . pantoprazole (PROTONIX) EC tablet 40 mg  40 mg Oral BID Meredeth IdeLama, Gagan S, MD   40 mg at 09/07/18 0900  . sucralfate (CARAFATE) tablet 1 g  1 g Oral TID WC & HS Meredeth IdeLama, Gagan S, MD   1 g at 09/07/18 1754    Allergies as of 09/06/2018  . (No Known Allergies)    Family History  Problem Relation Age of Onset  . Rheum arthritis Mother   . Hypertension Mother   . Alcoholism Mother   . Heart failure Father        rheumatic heart disease, also drinking and smoking  .  Alcoholism Father        died age 42  . Colon cancer Neg Hx     Social History   Socioeconomic History  . Marital status: Single    Spouse name: Not on file  . Number of children: 1  . Years of education: Not on file  . Highest education level: Not on file  Occupational History  . Occupation: dye cutter helper    Employer: [employer]  Social Needs  . Financial resource strain: Not on file  . Food insecurity:    Worry: Not on file    Inability: Not on file  . Transportation needs:    Medical: Not on file  Non-medical: Not on file  Tobacco Use  . Smoking status: Never Smoker  . Smokeless tobacco: Never Used  Substance and Sexual Activity  . Alcohol use: No    Comment: etoh free since 08/2015.  . Drug use: No    Comment: used cocaine before but has been clean since 08/2015.   [name] Sexual activity: Yes  Lifestyle  . Physical activity:    Days per week: Not on file    Minutes per session: Not on file  . Stress: Not on file  Relationships  . Social connections:    Talks on phone: Not on file    Gets together: Not on file    Attends religious service: Not on file    Active member of club or organization: Not on file    Attends meetings of clubs or organizations: Not on file    Relationship status: Not on file  . Intimate partner violence:    Fear of current or ex partner: Not on file    Emotionally abused: Not on file    Physically abused: Not on file    Forced sexual activity: Not on file  Other Topics Concern  . Not on file  Social History Narrative   Single.  Lives with his mother.  1 daughter 36 years old in June 2017.       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 Going to concerts    Review of Systems: See HPI  Physical Exam: Vital signs in last 24 hours: Temp:  [97.8 F (36.6 C)-99 F (37.2 C)] 98.4 F (36.9 C) (11/25 2101) Pulse Rate:  [73-77] 74 (11/25 2101) Resp:  [16-19] 16 (11/25 2101) BP:  (135-147)/(77-95) 135/95 (11/25 2101) SpO2:  [98 %-100 %] 100 % (11/25 2101) Last BM Date: 09/07/18  Healthy-appearing.  Slight skin pallor, but no icterus.  Right eye shows changes associated with blindness.  Chest clear, heart normal, abdomen without mass or tenderness, skin warm and dry, radial pulse full, no evident focal neurologic deficits, cognition normal, mood normal  Intake/Output from previous day: 11/24 0701 - 11/25 0700 In: 1310 [P.O.:240; I.V.:250; Blood:720; IV Piggyback:100] Out: 150 [Urine:150] Intake/Output this shift: Total I/O In: 120 [P.O.:120] Out: 125 [Urine:125]  Lab Results: Recent Labs    09/06/18 1233 09/07/18 0421  WBC 6.6 6.7  HGB 8.0* 8.9*  HCT 26.1* 29.1*  PLT 363 289   BMET Recent Labs    09/06/18 1233 09/07/18 0421  NA 144 141  K 3.7 3.9  CL 111 109  CO2 27 26  GLUCOSE 116* 109*  BUN 16 13  CREATININE 1.54* 1.23  CALCIUM 9.1 8.4*   LFT Recent Labs    09/07/18 0421  PROT 5.8*  ALBUMIN 3.4*  AST 10*  ALT 9  ALKPHOS 38  BILITOT 0.5   PT/INR No results for input(s): LABPROT, INR in the last 72 hours.  Studies/Results: Dg Chest 2 View  Result Date: 09/06/2018 CLINICAL DATA:  42 year old male with history of black stool yesterday. Recent history of GI bleed. Shortness of breath with exertion. EXAM: CHEST - 2 VIEW COMPARISON:  Chest x-ray 06/18/2018. FINDINGS: Lung volumes are normal. No consolidative airspace disease. No pleural effusions. No pneumothorax. No pulmonary nodule or mass noted. Pulmonary vasculature and the cardiomediastinal silhouette are within normal limits. IMPRESSION: No radiographic evidence of acute cardiopulmonary disease. Electronically Signed   By: Trudie Reed M.D.   On: 09/06/2018 14:01  Impression: 1.  Recent GI bleed presumably from ibuprofen induced peptic ulceration 2.  Probable recurrent bleed, characterized by significant drop in hemoglobin, weakness, and recurrent dark stools.  This raises  the question of possible medication noncompliance or repeat exposure to nonsteroidals.  In any event, it appears that the bleeding is now quiescent, because his stool is lightening up, he is having less frequent stools, and he looks and feels better. 3.  History of hepatitis C  Plan: Proceed to endoscopic evaluation tomorrow, risks reviewed, patient agreeable.  Will temporarily stop sucralfate prior to endoscopy, so as to optimize visualization of the mucosa.   LOS: 0 days   Katy Fitch Katalaya Beel  09/07/2018, 10:05 PM   Pager [phone] If no answer or after 5 PM call [phone]

## 2018-09-07 NOTE — ED Provider Notes (Signed)
[place] [facility] GENERAL SURGERY Provider Note   CSN: [id number] Arrival date & time: 09/06/18  1157     History   Chief Complaint Chief Complaint  Patient presents with  . Rectal Bleeding  . Shortness of Breath    HPI Gabriel Garcia is a 42 y.o. male.  HPI 42 year old male who presents the emergency department with complaints of return of melena.  Recently hospitalized and found to have upper GI bleed requiring endoscopy and injection by GI.  Reports exertional shortness of breath and lightheadedness with standing with associated dizziness.  Generalized fatigue.  Denies nausea vomiting.  Symptoms are moderate in severity   Past Medical History:  Diagnosis Date  . Acute duodenal ulcer with hemorrhage 10/12/2014  . GERD (gastroesophageal reflux disease)   . GI bleed   . Hepatitis C   . Hypertension   . 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 Legally blind in right eye, as defined in [place]   . Mesenteric adenitis   . Sliding hiatal hernia   . Substance abuse (HCC)    cocaine  . Terminal ileitis [facility])     Patient Active Problem List   Diagnosis Date Noted  . Anemia 09/06/2018  . Upper GI bleed 08/31/2018  . UGIB (upper gastrointestinal bleed) 08/30/2018  . Atypical chest pain 06/18/2018  . Mallory-Weiss tear 12/03/2016  . BPPV (benign paroxysmal positional vertigo) 09/12/2016  . Genital herpes 09/12/2016  . OSA (obstructive sleep apnea) 05/28/2016  . AKI (acute kidney injury) (HCC) 04/22/2016  . Obese 11/17/2015  . Cocaine abuse (HCC) 11/16/2015  . Alcoholism (HCC) 11/16/2015  . Gout 11/16/2015  . Legally blind in right eye, as defined in [place]   . Acute duodenal ulcer with hemorrhage 10/12/2014  . EKG abnormality 10/12/2014  . Hypertension 10/06/2014  . Esophageal reflux 11/24/2013  . Hepatitis C     Past Surgical History:  Procedure Laterality Date  . ESOPHAGOGASTRODUODENOSCOPY N/A 10/13/2014   Procedure:  ESOPHAGOGASTRODUODENOSCOPY (EGD);  Surgeon: Iva Boop, MD;  Location: [facility];  Service: Endoscopy;  Laterality: N/A;  . ESOPHAGOGASTRODUODENOSCOPY N/A 11/17/2016   Procedure: ESOPHAGOGASTRODUODENOSCOPY (EGD);  Surgeon: Charlott Rakes, MD;  Location[facility] ENDOSCOPY;  Service: Endoscopy;  Laterality: N/A;  . ESOPHAGOGASTRODUODENOSCOPY (EGD) WITH PROPOFOL N/A 09/01/2018   Procedure: ESOPHAGOGASTRODUODENOSCOPY (EGD) WITH PROPOFOL;  Surgeon: Carman Ching, MD;  Location: [facility] ENDOSCOPY;  Service: Endoscopy;  Laterality: N/A;  . EYE SURGERY Right   . FOOT SURGERY Right   . LIVER BIOPSY     Dr. Kinnie Scales GI        Home Medications    Prior to Admission medications   Medication Sig Start Date End Date Taking? Authorizing Provider  calcium carbonate (TUMS - DOSED IN MG ELEMENTAL CALCIUM) 500 MG chewable tablet Chew 1 tablet by mouth 3 (three) times daily as needed for indigestion or heartburn.   Yes [provider]  colchicine 0.6 MG tablet Take 1 tablet (0.6 mg total) by mouth daily. Patient not taking: Reported on 08/30/2018 08/11/18   Harlene Salts A, PA-C  lidocaine (LIDODERM) 5 % Place 1 patch onto the skin daily. Remove & Discard patch within 12 hours or as directed by MD Patient not taking: Reported on 08/30/2018 08/03/18   Harlene Salts A, PA-C  methocarbamol (ROBAXIN) 500 MG tablet Take 1 tablet (500 mg total) by mouth 2 (two) times daily. Patient not taking: Reported on 08/30/2018 08/03/18   Harlene Salts A, PA-C  pantoprazole (  PROTONIX) 40 MG tablet Take 1 tablet (40 mg total) by mouth 2 (two) times daily. Patient not taking: Reported on 09/06/2018 09/02/18   Burnadette PopAdhikari, Amrit, MD  sucralfate (CARAFATE) 1 g tablet Take 1 tablet (1 g total) by mouth 4 (four) times daily -  with meals and at bedtime. Patient not taking: Reported on 08/30/2018 08/03/18   Harlene SaltsMorelli, Brandon A, PA-C  valACYclovir (VALTREX) 500 MG tablet Take 1 tablet (500 mg total) by mouth 2 (two) times  daily. For 3 days with outbreaks Patient not taking: Reported on 08/30/2018 04/09/18   Shelva MajesticHunter, Stephen O, MD    Family History Family History  Problem Relation Age of Onset  . Rheum arthritis Mother   . Hypertension Mother   . Alcoholism Mother   . Heart failure Father        rheumatic heart disease, also drinking and smoking  . Alcoholism Father        died age 42  . Colon cancer Neg Hx     Social History Social History   Tobacco Use  . Smoking status: Never Smoker  . Smokeless tobacco: Never Used  Substance Use Topics  . Alcohol use: No    Comment: etoh free since 08/2015.  . Drug use: No    Comment: used cocaine before but has been clean since 08/2015.      Allergies   Patient has no known allergies.   Review of Systems Review of Systems  All other systems reviewed and are negative.    Physical Exam Updated Vital Signs BP (!) 147/77 (BP Location: Left Arm)   Pulse 73   Temp 99 F (37.2 C) (Oral)   Resp 19   Ht 5\' 10"  (1.778 m)   Wt 98.8 kg   SpO2 98%   BMI 31.25 kg/m   Physical Exam  Constitutional: He is oriented to person, place, and time. He appears well-developed and well-nourished.  HENT:  Head: Normocephalic and atraumatic.  Eyes: EOM are normal.  Neck: Normal range of motion.  Cardiovascular: Normal rate, regular rhythm and normal heart sounds.  Pulmonary/Chest: Effort normal and breath sounds normal. No respiratory distress.  Abdominal: Soft. He exhibits no distension. There is no tenderness.  Musculoskeletal: Normal range of motion.  Neurological: He is alert and oriented to person, place, and time.  Skin: Skin is warm and dry.  Psychiatric: He has a normal mood and affect. Judgment normal.  Nursing note and vitals reviewed.    ED Treatments / Results  Labs (all labs ordered are listed, but only abnormal results are displayed) Labs Reviewed  COMPREHENSIVE METABOLIC PANEL - Abnormal; Notable for the following components:       Result Value   Glucose, Bld 116 (*)    Creatinine, Ser 1.54 (*)    Total Protein 6.4 (*)    AST 12 (*)    GFR calc non Af Amer 54 (*)    All other components within normal limits  CBC - Abnormal; Notable for the following components:   RBC 2.73 (*)    Hemoglobin 8.0 (*)    HCT 26.1 (*)    All other components within normal limits  CBC - Abnormal; Notable for the following components:   RBC 3.07 (*)    Hemoglobin 8.9 (*)    HCT 29.1 (*)    All other components within normal limits  COMPREHENSIVE METABOLIC PANEL - Abnormal; Notable for the following components:   Glucose, Bld 109 (*)    Calcium 8.4 (*)  Total Protein 5.8 (*)    Albumin 3.4 (*)    AST 10 (*)    All other components within normal limits  POC OCCULT BLOOD, ED  TYPE AND SCREEN  PREPARE RBC (CROSSMATCH)    EKG None  Radiology Dg Chest 2 View  Result Date: 09/06/2018 CLINICAL DATA:  42 year old male with history of black stool yesterday. Recent history of GI bleed. Shortness of breath with exertion. EXAM: CHEST - 2 VIEW COMPARISON:  Chest x-ray 06/18/2018. FINDINGS: Lung volumes are normal. No consolidative airspace disease. No pleural effusions. No pneumothorax. No pulmonary nodule or mass noted. Pulmonary vasculature and the cardiomediastinal silhouette are within normal limits. IMPRESSION: No radiographic evidence of acute cardiopulmonary disease. Electronically Signed   By: Trudie Reed M.D.   On: 09/06/2018 14:01    Procedures .Critical Care Performed by: Azalia Bilis, MD Authorized by: Azalia Bilis, MD     CRITICAL CARE Performed by: Azalia Bilis Total critical care time: 32 minutes Critical care time was exclusive of separately billable procedures and treating other patients. Critical care was necessary to treat or prevent imminent or life-threatening deterioration. Critical care was time spent personally by me on the following activities: development of treatment plan with patient and/or  surrogate as well as nursing, discussions with consultants, evaluation of patient's response to treatment, examination of patient, obtaining history from patient or surrogate, ordering and performing treatments and interventions, ordering and review of laboratory studies, ordering and review of radiographic studies, pulse oximetry and re-evaluation of patient's condition.   Medications Ordered in ED Medications  0.9 %  sodium chloride infusion (Manually program via Guardrails IV Fluids) ( Intravenous Stopped 09/06/18 1621)  sucralfate (CARAFATE) tablet 1 g (1 g Oral Given 09/06/18 2257)  0.9 %  sodium chloride infusion ( Intravenous New Bag/Given 09/06/18 1623)  ondansetron (ZOFRAN) tablet 4 mg (has no administration in time range)    Or  ondansetron (ZOFRAN) injection 4 mg (has no administration in time range)  acetaminophen (TYLENOL) tablet 650 mg (has no administration in time range)    Or  acetaminophen (TYLENOL) suppository 650 mg (has no administration in time range)  pantoprazole (PROTONIX) EC tablet 40 mg (has no administration in time range)  pantoprazole (PROTONIX) 80 mg in sodium chloride 0.9 % 100 mL IVPB ( Intravenous Stopped 09/06/18 1643)  colchicine tablet 0.6 mg (0.6 mg Oral Given 09/07/18 0025)     Initial Impression / Assessment and Plan / ED Course  I have reviewed the triage vital signs and the nursing notes.  Pertinent labs & imaging results that were available during my care of the patient were reviewed by me and considered in my medical decision making (see chart for details).    Will admit for symptomatic anemia. Blood transfusion x 2 units now. GI consultation in AM may be beneficial. protonix now. Has not filled his protonix prescription yet  Final Clinical Impressions(s) / ED Diagnoses   Final diagnoses:  Symptomatic anemia    ED Discharge Orders    None       Azalia Bilis, MD 09/07/18 1718

## 2018-09-07 NOTE — Progress Notes (Signed)
Spoke w/ pt on telephone--we have been asked to see him for ?recurrent GIB.  I have him tentatively scheduled for egd at 11 am tomorrow.  I will be seeing the patient later today.  Florencia Reasonsobert V. Abner Ardis, M.D. Pager [phone] If no answer or after 5 PM call [phone]

## 2018-09-07 NOTE — Plan of Care (Signed)
  Problem: Safety: Goal: Ability to remain free from injury will improve Outcome: Progressing   Problem: Skin Integrity: Goal: Risk for impaired skin integrity will decrease Outcome: Progressing   Problem: Pain Managment: Goal: General experience of comfort will improve Outcome: Progressing   

## 2018-09-07 NOTE — Progress Notes (Signed)
[facility]  PROGRESS NOTE  Gabriel Garcia ZOX:[id number] DOB: Feb 13, 1976 DOA: 09/06/2018 PCP: [name], MD   Brief HPI:   42 year old male with a history of hypertension, gout, hepatitis C without stenosis, recent EGD showed esophagitis.  Blood in the duodenal bulb status post epinephrine injection came to hospital with dyspnea on exertion.  He was found to have a hemoglobin of 8.0.  Hemoglobin at time of discharge on November 20 was 8.3.  Patient was admitted for transfusion.    Subjective   Patient seen and examined, status post 2 units of PRBC.  Today hemoglobin is 8.9.   Assessment/Plan:     1. Symptomatic anemia-improved after blood transfusion, hemoglobin is 8.9.  Will consult GI as patient's hemoglobin still low even after 2 units of PRBC.  Called and discussed with Dr. Randa Evens who recommends keeping n.p.o., patient might need repeat EGD during this hospitalization.  2. Recent upper GI bleed-as above patient might need EGD.  We will keep him n.p.o. after midnight.  Continue Protonix 40 mg p.o. twice daily.  Continue Carafate.  3. Acute kidney injury-creatinine has improved today, 1.23.  Follow BMP in am.  4. Chronic hepatitis C without cirrhosis-patient was treated with Harvoni, follow-up with GI as outpatient.   5. Gout-started on home medication colchicine.     CBG: No results for input(s): GLUCAP in the last 168 hours.  CBC: Recent Labs  Lab 09/01/18 0535 09/02/18 0357 09/06/18 1233 09/07/18 0421  WBC 6.0 5.5 6.6 6.7  NEUTROABS 3.5 2.9  --   --   HGB 8.4* 8.3* 8.0* 8.9*  HCT 27.3* 26.7* 26.1* 29.1*  MCV 97.8 96.7 95.6 94.8  PLT 236 232 363 289    Basic Metabolic Panel: Recent Labs  Lab 09/06/18 1233 09/07/18 0421  NA 144 141  K 3.7 3.9  CL 111 109  CO2 27 26  GLUCOSE 116* 109*  BUN 16 13  CREATININE 1.54* 1.23  CALCIUM 9.1 8.4*     DVT prophylaxis: SCDs  Code Status: Full code  Family Communication: No family at  bedside  Disposition Plan: likely home when medically ready for discharge   Consultants:  None  Procedures:  None   Antibiotics:   Anti-infectives (From admission, onward)   None       Objective   Vitals:   09/06/18 2109 09/06/18 2256 09/07/18 0050 09/07/18 0426  BP: 133/79 137/83  (!) 147/77  Pulse: 74 77  73  Resp: 15 17 18 19   Temp: 98.6 F (37 C) 98.6 F (37 C)  99 F (37.2 C)  TempSrc: Oral Oral  Oral  SpO2: 100% 99%  98%  Weight:      Height:        Intake/Output Summary (Last 24 hours) at 09/07/2018 1313 Last data filed at 09/07/2018 1031 Gross per 24 hour  Intake 1430 ml  Output 150 ml  Net 1280 ml   Filed Weights   09/06/18 1655  Weight: 98.8 kg     Physical Examination:    General: Appears in no acute distress  Cardiovascular: S1-S2, regular, no murmurs auscultated  Respiratory: Clear to auscultation bilaterally  Abdomen: Soft, nontender , no organomegaly  Extremities: No edema of the lower extremities  Neurologic: Alert, oriented x3, no focal deficit noted     Data Reviewed: I have personally reviewed following labs and imaging studies   No results found for this or any previous visit (from the past 240 hour(s)).   Liver Function  Tests: Recent Labs  Lab 09/06/18 1233 09/07/18 0421  AST 12* 10*  ALT 11 9  ALKPHOS 40 38  BILITOT 0.3 0.5  PROT 6.4* 5.8*  ALBUMIN 3.6 3.4*   No results for input(s): LIPASE, AMYLASE in the last 168 hours. No results for input(s): AMMONIA in the last 168 hours.  Cardiac Enzymes: No results for input(s): CKTOTAL, CKMB, CKMBINDEX, TROPONINI in the last 168 hours. BNP (last 3 results) Recent Labs    06/18/18 1926  BNP 16.8    ProBNP (last 3 results) No results for input(s): PROBNP in the last 8760 hours.    Studies: Dg Chest 2 View  Result Date: 09/06/2018 CLINICAL DATA:  42 year old male with history of black stool yesterday. Recent history of GI bleed. Shortness of breath  with exertion. EXAM: CHEST - 2 VIEW COMPARISON:  Chest x-ray 06/18/2018. FINDINGS: Lung volumes are normal. No consolidative airspace disease. No pleural effusions. No pneumothorax. No pulmonary nodule or mass noted. Pulmonary vasculature and the cardiomediastinal silhouette are within normal limits. IMPRESSION: No radiographic evidence of acute cardiopulmonary disease. Electronically Signed   By: Trudie Reedaniel  Entrikin M.D.   On: 09/06/2018 14:01    Scheduled Meds: . sodium chloride   Intravenous Once  . colchicine  0.6 mg Oral Daily  . pantoprazole  40 mg Oral BID  . sucralfate  1 g Oral TID WC & HS    Admission status: Observation  Time spent: 25 min  Meredeth IdeGagan S    [facility] Pager [phone]. If 7PM-7AM, please contact night-coverage at [email or web address], Office  [phone]  password [email or web address]  09/07/2018, 1:13 PM  LOS: 0 days

## 2018-09-08 ENCOUNTER — Observation Stay (HOSPITAL_COMMUNITY): Payer: 59 | Admitting: Certified Registered Nurse Anesthetist

## 2018-09-08 ENCOUNTER — Encounter (HOSPITAL_COMMUNITY): Payer: Self-pay | Admitting: *Deleted

## 2018-09-08 ENCOUNTER — Encounter (HOSPITAL_COMMUNITY)
Admission: EM | Disposition: A | Discharge status: Home / Self Care | Payer: Self-pay | Source: Home / Self Care | Attending: Emergency Medicine

## 2018-09-08 DIAGNOSIS — K921 Melena: Secondary | ICD-10-CM | POA: Diagnosis not present

## 2018-09-08 DIAGNOSIS — R0602 Shortness of breath: Secondary | ICD-10-CM | POA: Diagnosis not present

## 2018-09-08 DIAGNOSIS — D649 Anemia, unspecified: Secondary | ICD-10-CM | POA: Diagnosis not present

## 2018-09-08 DIAGNOSIS — D62 Acute posthemorrhagic anemia: Secondary | ICD-10-CM | POA: Diagnosis not present

## 2018-09-08 DIAGNOSIS — K449 Diaphragmatic hernia without obstruction or gangrene: Secondary | ICD-10-CM | POA: Diagnosis not present

## 2018-09-08 DIAGNOSIS — K210 Gastro-esophageal reflux disease with esophagitis: Secondary | ICD-10-CM | POA: Diagnosis not present

## 2018-09-08 DIAGNOSIS — K222 Esophageal obstruction: Secondary | ICD-10-CM | POA: Diagnosis not present

## 2018-09-08 DIAGNOSIS — K3189 Other diseases of stomach and duodenum: Secondary | ICD-10-CM | POA: Diagnosis not present

## 2018-09-08 HISTORY — PX: BIOPSY: SHX5522

## 2018-09-08 HISTORY — PX: ESOPHAGOGASTRODUODENOSCOPY (EGD) WITH PROPOFOL: SHX5813

## 2018-09-08 LAB — BASIC METABOLIC PANEL
ANION GAP: 5 (ref 5–15)
BUN: 12 mg/dL (ref 6–20)
CHLORIDE: 110 mmol/L (ref 98–111)
CO2: 27 mmol/L (ref 22–32)
Calcium: 8.5 mg/dL — ABNORMAL LOW (ref 8.9–10.3)
Creatinine, Ser: 1.14 mg/dL (ref 0.61–1.24)
GFR calc Af Amer: >60 mL/min (ref >60)
GFR calc non Af Amer: >60 mL/min (ref >60)
GLUCOSE: 109 mg/dL — AB (ref 70–99)
Potassium: 3.7 mmol/L (ref 3.5–5.1)
SODIUM: 142 mmol/L (ref 135–145)

## 2018-09-08 LAB — CBC
HCT: 29.4 % — ABNORMAL LOW (ref 39.0–52.0)
HEMOGLOBIN: 9.1 g/dL — AB (ref 13.0–17.0)
MCH: 28.8 pg (ref 26.0–34.0)
MCHC: 31 g/dL (ref 30.0–36.0)
MCV: 93 fL (ref 80.0–100.0)
NRBC: 0 % (ref 0.0–0.2)
Platelets: 325 10*3/uL (ref 150–400)
RBC: 3.16 MIL/uL — ABNORMAL LOW (ref 4.22–5.81)
RDW: 14.5 % (ref 11.5–15.5)
WBC: 7.3 10*3/uL (ref 4.0–10.5)

## 2018-09-08 SURGERY — ESOPHAGOGASTRODUODENOSCOPY (EGD) WITH PROPOFOL
Anesthesia: Monitor Anesthesia Care

## 2018-09-08 MED ORDER — PROPOFOL 10 MG/ML IV BOLUS
INTRAVENOUS | Status: DC | PRN
  Administered 2018-09-08: 40 mg via INTRAVENOUS

## 2018-09-08 MED ORDER — HYDROCODONE-ACETAMINOPHEN 5-325 MG PO TABS: 1.0000 | ORAL_TABLET | Freq: Four times a day (QID) | ORAL | 0 refills | Status: DC | PRN

## 2018-09-08 MED ORDER — LACTATED RINGERS IV SOLN
INTRAVENOUS | Status: DC
  Administered 2018-09-08: 1000 mL via INTRAVENOUS

## 2018-09-08 MED ORDER — HYDROMORPHONE HCL 1 MG/ML IJ SOLN
1.0000 mg | Freq: Once | INTRAMUSCULAR | Status: AC
  Administered 2018-09-08: 1 mg via INTRAVENOUS
  Filled 2018-09-08: qty 1

## 2018-09-08 MED ORDER — ONDANSETRON HCL 4 MG/2ML IJ SOLN
INTRAMUSCULAR | Status: DC | PRN
  Administered 2018-09-08: 4 mg via INTRAVENOUS

## 2018-09-08 MED ORDER — PANTOPRAZOLE SODIUM 40 MG PO TBEC: 40.0000 mg | DELAYED_RELEASE_TABLET | Freq: Two times a day (BID) | ORAL | 1 refills | Status: DC

## 2018-09-08 MED ORDER — PROPOFOL 500 MG/50ML IV EMUL
INTRAVENOUS | Status: DC | PRN
  Administered 2018-09-08: 200 ug/kg/min via INTRAVENOUS

## 2018-09-08 MED ORDER — LIDOCAINE 2% (20 MG/ML) 5 ML SYRINGE
INTRAMUSCULAR | Status: DC | PRN
  Administered 2018-09-08: 100 mg via INTRAVENOUS

## 2018-09-08 SURGICAL SUPPLY — 15 items

## 2018-09-08 NOTE — Progress Notes (Signed)
Endoscopy shows severe esophagitis but no evidence of bleeding and nothing that looks like it would put him at risk for further bleeding at this time.  I think the patient is okay for discharge.  Recommendations:  1.  Discharge patient home 2.  Pantoprazole 40 mg twice daily until further notice 3.  I have instructed the patient to contact our office (I cannot make the appointment now, our phones are out of order at the moment) to make a follow-up appointment with Dr. Carman ChingJames Edwards for approximately 2 to 3 weeks from now. 4.  There may be value in a repeat endoscopy in a couple of months to confirm healing of the esophagitis. 5.  No dietary restrictions  Florencia Reasonsobert V. Shateka Petrea, M.D. Pager [phone] If no answer or after 5 PM call [phone]

## 2018-09-08 NOTE — Discharge Summary (Signed)
Physician Discharge Summary  Gabriel GilfordBilly Jason Millett ZOX:[id number] DOB: 09/27/1976 DOA: 09/06/2018  PCP: Shelva MajesticHunter, Stephen O, MD  Admit date: 09/06/2018 Discharge date: 09/08/2018  Time spent: 35 minutes  Recommendations for Outpatient Follow-up:  1. Follow up [facility] in 2 weeks   Discharge Diagnoses:  Active Problems:   Anemia   Discharge Condition: Stable  Diet recommendation: Regular diet  Filed Weights   09/06/18 1655 09/08/18 1121  Weight: 98.8 kg 98.8 kg    History of present illness:  42 year old male with a history of hypertension, gout, hepatitis C without stenosis, recent EGD showed esophagitis.  Blood in the duodenal bulb status post epinephrine injection came to hospital with dyspnea on exertion.  He was found to have a hemoglobin of 8.0.  Hemoglobin at time of discharge on November 20 was 8.3.  Patient was admitted for transfusion.  Hospital Course:   1. Symptomatic anemia-improved after 2 units PRBC, hemoglobin is 9.1.   2. Recent upper GI bleed- patient underwent EGD which showed esophagitis. Biopsy has been taken and he will follow up GI as outpatient. Continue Protonix 40 mg po bid  3. Acute kidney injury-creatinine has improved today,1.14  4. Chronic hepatitis C without cirrhosis-patient was treated with Harvoni, follow-up with GI as outpatient.   5. Gout-started on home medication colchicine. Will give Vicodin prn.  Procedures:  EGD  Consultations:  GI  Discharge Exam: Vitals:   09/08/18 1335 09/08/18 1411  BP:  (!) 147/92  Pulse: 70 72  Resp: 10 18  Temp:  (!) 97.4 F (36.3 C)  SpO2: 100% 100%    General: Appears in no acute distress Cardiovascular: S1S2 RRR Respiratory: Clear bilaterally  Discharge Instructions   Discharge Instructions    Diet - low sodium heart healthy   Complete by:  As directed    Increase activity slowly   Complete by:  As directed      Allergies as of 09/08/2018   No Known Allergies     Medication  List    STOP taking these medications   sucralfate 1 g tablet Commonly known as:  CARAFATE     TAKE these medications   calcium carbonate 500 MG chewable tablet Commonly known as:  TUMS - dosed in mg elemental calcium Chew 1 tablet by mouth 3 (three) times daily as needed for indigestion or heartburn.   colchicine 0.6 MG tablet Take 1 tablet (0.6 mg total) by mouth daily.   HYDROcodone-acetaminophen 5-325 MG tablet Commonly known as:  NORCO/VICODIN Take 1 tablet by mouth every 6 (six) hours as needed for moderate pain.   lidocaine 5 % Commonly known as:  LIDODERM Place 1 patch onto the skin daily. Remove & Discard patch within 12 hours or as directed by MD   methocarbamol 500 MG tablet Commonly known as:  ROBAXIN Take 1 tablet (500 mg total) by mouth 2 (two) times daily.   pantoprazole 40 MG tablet Commonly known as:  PROTONIX Take 1 tablet (40 mg total) by mouth 2 (two) times daily.   valACYclovir 500 MG tablet Commonly known as:  VALTREX Take 1 tablet (500 mg total) by mouth 2 (two) times daily. For 3 days with outbreaks      No Known Allergies Follow-up Information    Shelva MajesticHunter, Stephen O, MD.   Specialty:  Family Medicine Contact information: [address] [place] [address] [phone]            The results of significant diagnostics from this hospitalization (including imaging,  microbiology, ancillary and laboratory) are listed below for reference.    Significant Diagnostic Studies: Dg Chest 2 View  Result Date: 09/06/2018 CLINICAL DATA:  42 year old male with history of black stool yesterday. Recent history of GI bleed. Shortness of breath with exertion. EXAM: CHEST - 2 VIEW COMPARISON:  Chest x-ray 06/18/2018. FINDINGS: Lung volumes are normal. No consolidative airspace disease. No pleural effusions. No pneumothorax. No pulmonary nodule or mass noted. Pulmonary vasculature and the cardiomediastinal silhouette are within normal limits.  IMPRESSION: No radiographic evidence of acute cardiopulmonary disease. Electronically Signed   By: Trudie Reed M.D.   On: 09/06/2018 14:01    Microbiology: No results found for this or any previous visit (from the past 240 hour(s)).   Labs: Basic Metabolic Panel: Recent Labs  Lab 09/06/18 1233 09/07/18 0421 09/08/18 0356  NA 144 141 142  K 3.7 3.9 3.7  CL 111 109 110  CO2 27 26 27   GLUCOSE 116* 109* 109*  BUN 16 13 12   CREATININE 1.54* 1.23 1.14  CALCIUM 9.1 8.4* 8.5*   Liver Function Tests: Recent Labs  Lab 09/06/18 1233 09/07/18 0421  AST 12* 10*  ALT 11 9  ALKPHOS 40 38  BILITOT 0.3 0.5  PROT 6.4* 5.8*  ALBUMIN 3.6 3.4*   No results for input(s): LIPASE, AMYLASE in the last 168 hours. No results for input(s): AMMONIA in the last 168 hours. CBC: Recent Labs  Lab 09/02/18 0357 09/06/18 1233 09/07/18 0421 09/08/18 0356  WBC 5.5 6.6 6.7 7.3  NEUTROABS 2.9  --   --   --   HGB 8.3* 8.0* 8.9* 9.1*  HCT 26.7* 26.1* 29.1* 29.4*  MCV 96.7 95.6 94.8 93.0  PLT 232 363 289 325   Cardiac Enzymes: No results for input(s): CKTOTAL, CKMB, CKMBINDEX, TROPONINI in the last 168 hours. BNP: BNP (last 3 results) Recent Labs    06/18/18 1926  BNP 16.8       Signed:  Meredeth Ide MD.  [facility] 09/08/2018, 2:42 PM

## 2018-09-08 NOTE — Progress Notes (Signed)
Pt alert and oriented.  Prescriptions and d/c instructions were given.  All questions answered.  Pt d/cd home.

## 2018-09-08 NOTE — Anesthesia Preprocedure Evaluation (Signed)
Anesthesia Evaluation  Patient identified by MRN, date of birth, ID band Patient awake    Reviewed: Allergy & Precautions, NPO status , Patient's Chart, lab work & pertinent test results  Airway Mallampati: III  TM Distance: >3 FB Neck ROM: Full    Dental  (+) Teeth Intact, Dental Advisory Given   Pulmonary sleep apnea ,    breath sounds clear to auscultation       Cardiovascular hypertension,  Rhythm:Regular Rate:Normal     Neuro/Psych PSYCHIATRIC DISORDERS negative neurological ROS     GI/Hepatic hiatal hernia, PUD, GERD  ,(+) Hepatitis -, C  Endo/Other  negative endocrine ROS  Renal/GU      Musculoskeletal negative musculoskeletal ROS (+)   Abdominal Normal abdominal exam  (+)   Peds  Hematology negative hematology ROS (+)   Anesthesia Other Findings   Reproductive/Obstetrics                             Lab Results  Component Value Date   WBC 7.3 09/08/2018   HGB 9.1 (L) 09/08/2018   HCT 29.4 (L) 09/08/2018   MCV 93.0 09/08/2018   PLT 325 09/08/2018   Lab Results  Component Value Date   CREATININE 1.14 09/08/2018   BUN 12 09/08/2018   NA 142 09/08/2018   K 3.7 09/08/2018   CL 110 09/08/2018   CO2 27 09/08/2018   Lab Results  Component Value Date   INR 0.98 08/31/2018   INR 0.89 06/18/2018   INR 1.10 11/17/2016     EKG: normal EKG, normal sinus rhythm.  Echo: - Left ventricle: The cavity size was normal. Wall thickness was   increased in a pattern of mild LVH. Systolic function was low   normal to mildly reduced. The estimated ejection fraction was in   the range of 50% to 55%. Wall motion was normal; there were no   regional wall motion abnormalities. Features are consistent with   a pseudonormal left ventricular filling pattern, with concomitant   abnormal relaxation and increased filling pressure (grade 2   diastolic dysfunction). - Aortic valve: There was no  stenosis. - Aorta: Mildly dilated aortic root. Aortic root dimension: 39 mm   (ED). - Mitral valve: There was mild regurgitation. - Left atrium: The atrium was mildly dilated. - Right ventricle: The cavity size was normal. Systolic function   was normal. - Pulmonary arteries: No complete TR doppler jet so unable to   estimate PA systolic pressure. - Inferior vena cava: The vessel was normal in size. The   respirophasic diameter changes were in the normal range (>= 50%),   consistent with normal central venous pressure.   Anesthesia Physical  Anesthesia Plan  ASA: III  Anesthesia Plan: MAC   Post-op Pain Management:    Induction: Intravenous  PONV Risk Score and Plan: 1 and Propofol infusion  Airway Management Planned: Natural Airway and Nasal Cannula  Additional Equipment: None  Intra-op Plan:   Post-operative Plan:   Informed Consent: I have reviewed the patients History and Physical, chart, labs and discussed the procedure including the risks, benefits and alternatives for the proposed anesthesia with the patient or authorized representative who has indicated his/her understanding and acceptance.     Plan Discussed with: CRNA  Anesthesia Plan Comments:         Anesthesia Quick Evaluation

## 2018-09-08 NOTE — Interval H&P Note (Signed)
History and Physical Interval Note:  09/08/2018 12:39 PM  Christepher Marlowe ShoresJason Resnik  has presented today for surgery, with the diagnosis of melena  The various methods of treatment have been discussed with the patient and family. After consideration of risks, benefits and other options for treatment, the patient has consented to  Procedure(s): ESOPHAGOGASTRODUODENOSCOPY (EGD) WITH PROPOFOL (N/A) as a surgical intervention .  The patient's history has been reviewed, patient examined, no change in status, stable for surgery.  I have reviewed the patient's chart and labs.  Questions were answered to the patient's satisfaction.     Katy Fitchobert V Jerica Creegan

## 2018-09-08 NOTE — Anesthesia Postprocedure Evaluation (Signed)
Anesthesia Post Note  Patient: Gabriel Garcia  Procedure(s) Performed: ESOPHAGOGASTRODUODENOSCOPY (EGD) WITH PROPOFOL (N/A ) BIOPSY     Patient location during evaluation: PACU Anesthesia Type: MAC Level of consciousness: awake and alert Pain management: pain level controlled Vital Signs Assessment: post-procedure vital signs reviewed and stable Respiratory status: spontaneous breathing, nonlabored ventilation, respiratory function stable and patient connected to nasal cannula oxygen Cardiovascular status: stable and blood pressure returned to baseline Postop Assessment: no apparent nausea or vomiting Anesthetic complications: no    Last Vitals:  Vitals:   09/08/18 1335 09/08/18 1411  BP:  (!) 147/92  Pulse: 70 72  Resp: 10 18  Temp:  (!) 36.3 C  SpO2: 100% 100%    Last Pain:  Vitals:   09/08/18 1411  TempSrc: Oral  PainSc:                  [name]

## 2018-09-08 NOTE — Op Note (Signed)
[facility] Patient Name: Gabriel Garcia Procedure Date: 09/08/2018 MRN: [id number] Attending MD: Bernette Redbird , MD Date of Birth: 1975-11-05 CSN: [id number] Age: 42 Admit Type: Inpatient Procedure:                Upper GI endoscopy Indications:              Acute post hemorrhagic anemia, recurrent melena in                            a patient with recent severe bleeding and suspicion                            of an ulcer at the bulb/2nd duodenum junction on                            egd 1 week ago. Providers:                Bernette Redbird, MD, Roselie Awkward, RN, Zoila Shutter,                            Technician, Maricela Curet, CRNA Referring MD:              Medicines:                Monitored Anesthesia Care Complications:            No immediate complications. Estimated Blood Loss:     Estimated blood loss was minimal. Procedure:                Pre-Anesthesia Assessment:                           - Prior to the procedure, a History and Physical                            was performed, and patient medications and                            allergies were reviewed. The patient's tolerance of                            previous anesthesia was also reviewed. The risks                            and benefits of the procedure and the sedation                            options and risks were discussed with the patient.                            All questions were answered, and informed consent                            was obtained. Prior Anticoagulants: The patient has  taken no previous anticoagulant or antiplatelet                            agents. ASA Grade Assessment: III - A patient with                            severe systemic disease. After reviewing the risks                            and benefits, the patient was deemed in                            satisfactory condition to undergo the procedure.  After obtaining informed consent, the endoscope was                            passed under direct vision. Throughout the                            procedure, the patient's blood pressure, pulse, and                            oxygen saturations were monitored continuously. The                            [id number]-H190 ([id number]) Olympus adult endoscope was                            introduced through the mouth, and advanced to the                            second part of duodenum. The upper GI endoscopy was                            accomplished without difficulty. The patient                            tolerated the procedure well. Scope In: Scope Out: Findings:      Moderately severe erosive distal esophagitis with no bleeding was found.       There were two longitudinal tongues of severely eroded mucosa extending       about 3-4 cm up the esophagus above the Z line, without stigmata of       recent hemorrhage.      A non-obstructing moderate Schatzki ring was found at the       gastroesophageal junction. There was a prominent diverticulum in the       distal esophagus about 2 cm above the Z line which was intermittently       visible.      A 3 cm hiatal hernia was present.      The entire examined stomach was normal.      The cardia and gastric fundus were normal on retroflexion.      Biopsies were taken with a cold forceps in the gastric antrum for       histology to check for H.  pylori.      The examined duodenum was normal. The area of the oozing noted on the       previous egd was carefully inspected, as well as 4-quadrant inspection       of the duodenum, and no abnormalities were seen, other than some       scope-induced minor trauma with minimal oozing on the outside       circumference of the duodenal wall. Impression:               - Moderately severe reflux and erosive esophagitis.                            Esophageal diverticulum suggestive of previous                             severe esophagitis.                           - Non-obstructing and moderate Schatzki ring.                           - 3 cm hiatal hernia.                           - Normal stomach.                           - Normal examined duodenum. No abnormalities at                            site of previous oozing.                           - Diverticulum in the distal esophagus.                           - Biopsies were taken with a cold forceps for                            histology.                           - It is possible that the patient's recent bleeding                            has been of esophageal rather than duodenal origin. Moderate Sedation:      This patient was sedated with monitored anesthesia care, not moderate       sedation. Recommendation:           - Await pathology results.                           - Continue present medications.                           - Return to Dr. Randa EvensEdwards' office in 2 weeks--call  for appointment.                           - Consider repeat egd in 2 mos to confirm healing                            of esophagitis. Procedure Code(s):        --- Professional ---                           [phone], Esophagogastroduodenoscopy, flexible,                            transoral; with biopsy, single or multiple Diagnosis Code(s):        --- Professional ---                           K21.0, Gastro-esophageal reflux disease with                            esophagitis                           K22.2, Esophageal obstruction                           D62, Acute posthemorrhagic anemia                           K92.1, Melena (includes Hematochezia) CPT copyright 2018 [employer]. All rights reserved. The codes documented in this report are preliminary and upon coder review may  be revised to meet current compliance requirements. Bernette Redbird, MD 09/08/2018 1:22:33 PM This report has been signed  electronically. Number of Addenda: 0

## 2018-09-08 NOTE — Transfer of Care (Signed)
Immediate Anesthesia Transfer of Care Note  Patient: Gabriel Garcia  Procedure(s) Performed: ESOPHAGOGASTRODUODENOSCOPY (EGD) WITH PROPOFOL (N/A ) BIOPSY  Patient Location: PACU  Anesthesia Type:MAC  Level of Consciousness: awake, alert  and oriented  Airway & Oxygen Therapy: Patient Spontanous Breathing and Patient connected to face mask oxygen  Post-op Assessment: Report given to RN and Post -op Vital signs reviewed and stable  Post vital signs: Reviewed and stable  Last Vitals:  Vitals Value Taken Time  BP    Temp    Pulse 85 09/08/2018  1:02 PM  Resp 13 09/08/2018  1:02 PM  SpO2 99 % 09/08/2018  1:02 PM  Vitals shown include unvalidated device data.  Last Pain:  Vitals:   09/08/18 1121  TempSrc: Oral  PainSc: 0-No pain      Patients Stated Pain Goal: 2 (25/05/39 7673)  Complications: No apparent anesthesia complications

## 2018-09-09 ENCOUNTER — Encounter (HOSPITAL_COMMUNITY): Payer: Self-pay | Admitting: Gastroenterology

## 2018-09-09 ENCOUNTER — Telehealth: Payer: Self-pay | Admitting: *Deleted

## 2018-09-09 NOTE — Telephone Encounter (Signed)
Left message requesting  call back.

## 2018-09-14 NOTE — Telephone Encounter (Signed)
Called patient and spoke with him. He states that his insurance has not kicked in yet. He states he is not sure if he will follow up with [facility] because of his insurance as well. [facility]. Patient states that he will keep this in mind. I explained that he has been in the hospital several times and needs to follow up with somebody. Patient stated understanding and states he will call when his insurance kicks in.

## 2018-09-14 NOTE — Telephone Encounter (Signed)
Thanks for going the extra mile Cassie and offering additional resources

## 2019-01-07 ENCOUNTER — Other Ambulatory Visit: Payer: Self-pay

## 2019-01-07 ENCOUNTER — Ambulatory Visit: Payer: 59 | Admitting: Family Medicine

## 2019-01-07 ENCOUNTER — Encounter: Payer: Self-pay | Admitting: Sports Medicine

## 2019-01-07 ENCOUNTER — Ambulatory Visit (INDEPENDENT_AMBULATORY_CARE_PROVIDER_SITE_OTHER): Payer: Self-pay | Admitting: Sports Medicine

## 2019-01-07 VITALS — BP 120/80 | HR 64 | Temp 97.8°F | Ht 70.0 in | Wt 239.4 lb

## 2019-01-07 DIAGNOSIS — M545 Low back pain, unspecified: Secondary | ICD-10-CM

## 2019-01-07 DIAGNOSIS — M9902 Segmental and somatic dysfunction of thoracic region: Secondary | ICD-10-CM

## 2019-01-07 DIAGNOSIS — M9901 Segmental and somatic dysfunction of cervical region: Secondary | ICD-10-CM

## 2019-01-07 DIAGNOSIS — M9905 Segmental and somatic dysfunction of pelvic region: Secondary | ICD-10-CM

## 2019-01-07 DIAGNOSIS — M542 Cervicalgia: Secondary | ICD-10-CM

## 2019-01-07 MED ORDER — CYCLOBENZAPRINE HCL 10 MG PO TABS: 10.0000 mg | ORAL_TABLET | Freq: Three times a day (TID) | ORAL | 1 refills | Status: DC | PRN

## 2019-01-07 MED ORDER — METHYLPREDNISOLONE 4 MG PO TBPK: ORAL_TABLET | ORAL | 0 refills | Status: DC

## 2019-01-07 NOTE — Patient Instructions (Addendum)
Also check out [employer]" which is a program developed by Dr. Myles Lipps.   There are links to a couple of his YouTube Videos below and I would like to see you performing one of his videos 5-6 days per week.  It is best to do these exercises first thing in the morning.  They will give you a good jumpstart here today and start normalizing the way you move.  A good intro video is: "Independence from Pain 7-minute Video" - [email or web address]   A more advanced video is: Scientist, research (medical) original 12 minutes" - [email or web address]  Exercises that focus more on the neck are as below: Dr. Derrill Kay with [employer] [facility] teaching neck and shoulder details Part 1 - https://youtu.be/[email or web address] Part 2 Dr. Derrill Kay with [facility] quick routine to practice daily - https://youtu.be/[email or web address]  Do not try to attempt the entire video when first beginning.  Try breaking of each exercise that he goes into shorter segments.  In other words, if they perform an exercise for 45 seconds, start with 15 seconds and rest and then resume when they begin the new activity.  If you work your way up to being able to do these videos without having to stop, I expect you will see significant improvements in your pain.  If you enjoy his videos and would like to find out more you can look on his website: [email or web address].  He has a workout streaming option as well as a DVD set available for purchase.  Amazon has the best price for his DVDs.     Please perform the exercise program that we have prepared for you and gone over in detail on a daily basis.  In addition to the handout you were provided you can access your program through: [email or web address]   Your unique program code is: [email or web address]

## 2019-01-07 NOTE — Progress Notes (Signed)
Gabriel Garcia. Gabriel [facility] [phone]  Gabriel Garcia - 43 y.o. male MRN [id number]  Date of birth: [birth year]  Visit Date: January 10, 2019   PCP: [name], MD    Referred by: [name], MD  SUBJECTIVE:  Chief Complaint  Patient presents with   Spine - Initial Assessment   New Patient (Initial Visit)    Low back pain    HPI: Patient presents with 2 weeks of worsening low back pain.  Reports he was moving a table and fell hitting the right buttock region and right lower back region.  He reports feeling a jarring sensation in his neck and his back.  He has been having throbbing intermittent pain in his low back that does radiate into his head since this time.  Occasionally gets pain radiating into the right lower extremity in the posterior thigh which stops just above the knee.  Symptoms also worsen with rotation of his neck as well as squatting and walking.  He is bought a new mattress and try new pillows without significant improvement.  He is taking intermittent ibuprofen with only mild improvements.  He also notes some slight nodularities within the right SI joint region that seem to be coming and going.  REVIEW OF SYSTEMS: Night time disturbances: Reports, Issues with both sleep onset and sleep maintenance intermittently Fevers, chills and night sweats: Denies Unexplained weight loss: Denies Personal history of cancer: Denies Changes in bowel or bladder habits: Denies Recent unreported falls: Denies New or worsening dyspnea or wheezing: Denies Headaches: Reports new onset, occipital region Numbness, tingling and weakness in the extremities: Denies Dizziness or presyncopal episodes: Denies Lower extremity edema: Denies  HISTORY:  Prior history reviewed and updated per electronic medical record.  Patient Active Problem List   Diagnosis Date Noted   Anemia 09/06/2018   Upper GI bleed 08/31/2018     UGIB (upper gastrointestinal bleed) 08/30/2018   Atypical chest pain 06/18/2018   Mallory-Weiss tear 12/03/2016   BPPV (benign paroxysmal positional vertigo) 09/12/2016   Genital herpes 09/12/2016   OSA (obstructive sleep apnea) 05/28/2016   AKI (acute kidney injury) (HCC) 04/22/2016   Obese 11/17/2015   Cocaine abuse (HCC) 11/16/2015    Clean 90 days 11/16/15. 58 meetings in a row with NA.     Alcoholism (HCC) 11/16/2015    Clean since 2016    Gout 11/16/2015    No rx    Legally blind in right eye, as defined in [place]    Acute duodenal ulcer with hemorrhage 10/12/2014    History of transfusion after gI bleed- heavy alcohol use, cocaine, ibuprofen.  Dr. Kinnie Scales and then [facility]- dismissed from both    EKG abnormality 10/12/2014   Hypertension 10/06/2014    Lisinopril 10mg --> adjusting--> lisinopril-hctz 20-12.5mg     Esophageal reflux 11/24/2013    No current rx    Hepatitis C     Recovering addict. Diagnosed in 2003 at a treatment facility. In 2014 desired treatment, was referred but something happened with referral. Apparently had liver biopsy as well    Social History   Occupational History   Occupation: dye cutter helper    Employer: [employer]  Tobacco Use   Smoking status: Never Smoker   Smokeless tobacco: Never Used  Substance and Sexual Activity   Alcohol use: No    Comment: etoh free since 08/2015.   Drug use: No    Comment: used  cocaine before but has been clean since 08/2015.    Sexual activity: Yes   Social History   Social History Narrative   Single.  Lives with his mother.  1 daughter 43 years old in June 2017.       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 Going to concerts   Past Medical History:  Diagnosis Date   Acute duodenal ulcer with hemorrhage 10/12/2014   GERD (gastroesophageal reflux disease)    GI bleed    Hepatitis C    Hypertension    Legal blindness of right  eye, as defined in 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)    Past Surgical History:  Procedure Laterality Date   BIOPSY  09/08/2018   Procedure: BIOPSY;  Surgeon: Bernette RedbirdBuccini, Robert, MD;  Location: [facility];  Service: Endoscopy;;   ESOPHAGOGASTRODUODENOSCOPY N/A 10/13/2014   Procedure: ESOPHAGOGASTRODUODENOSCOPY (EGD);  Surgeon: Iva Booparl E Gessner, MD;  [facility];  Service: Endoscopy;  Laterality: N/A;   ESOPHAGOGASTRODUODENOSCOPY N/A 11/17/2016   Procedure: ESOPHAGOGASTRODUODENOSCOPY (EGD);  Surgeon: Charlott RakesVincent Schooler, MD;  [facility];  Service: Endoscopy;  Laterality: N/A;   ESOPHAGOGASTRODUODENOSCOPY (EGD) WITH PROPOFOL N/A 09/01/2018   Procedure: ESOPHAGOGASTRODUODENOSCOPY (EGD) WITH PROPOFOL;  Surgeon: Carman ChingEdwards, James, MD;  Location: [facility];  Service: Endoscopy;  Laterality: N/A;   ESOPHAGOGASTRODUODENOSCOPY (EGD) WITH PROPOFOL N/A 09/08/2018   Procedure: ESOPHAGOGASTRODUODENOSCOPY (EGD) WITH PROPOFOL;  Surgeon: Bernette RedbirdBuccini, Robert, MD;  Location: [facility];  Service: Endoscopy;  Laterality: N/A;   EYE SURGERY Right    FOOT SURGERY Right    LIVER BIOPSY     [facility]   family history includes Alcoholism in his father and mother; Heart failure in his father; Hypertension in his mother; Rheum arthritis in his mother. There is no history of Colon cancer.  OBJECTIVE:  VS:  HT:5\' 10"  (177.8 cm)    WT:239 lb 6.4 oz (108.6 kg)   BMI:34.35     BP:120/80   HR:64bpm   TEMP:97.8 F (36.6 C)( )   RESP:97 %   PHYSICAL EXAM: CONSTITUTIONAL: Well-developed, Well-nourished and In no acute distress EYES: Pupils are equal., EOM intact without nystagmus. and No scleral icterus. Psychiatric: Alert & appropriately interactive. and Not depressed or anxious appearing. EXTREMITY EXAM: Warm and well perfused  Patient has bilateral  negative straight leg raise.  He has tight within bilateral hip flexors right worse than left.  He has poor external rotation of the right hip.  Negative Stinchfield test.  Positive Faber test.  Negative FADIR test.   ASSESSMENT:   1. Somatic dysfunction of cervical region   2. Somatic dysfunction of thoracic region   3. Somatic dysfunction of pelvis region   4. Acute right-sided low back pain without sciatica   5. Neck pain     PROCEDURES:  PROCEDURE NOTE: THERAPEUTIC EXERCISES ([id number])   Discussed the foundation of treatment for this condition is physical therapy and/or daily (5-6 days/week) therapeutic exercises, focusing on core strengthening, coordination, neuromuscular control/reeducation. 15 minutes spent for Therapeutic exercises as below and as referenced in the AVS. This included exercises focusing on stretching, strengthening, with significant focus on eccentric aspects.  Proper technique shown and discussed handout in great detail with ATC. All questions were discussed and answered.  Long term goals include an improvement in range of motion, strength, endurance as well as avoiding reinjury. Frequency of visits is one time as determined during today's office visit. Frequency of exercises to be performed is as per handout.  EXERCISES REVIEWED:   Derrill Kay Exercises Thoracic Mobility Pelvic recruitment/tilting Hip flexor stretching   PROCEDURE NOTE: OSTEOPATHIC MANIPULATION   The decision today to treat with Osteopathic Manipulative Therapy (OMT) was based on physical exam findings. Verbal consent was obtained following a discussion with the patient regarding the of risks, benefits and potential side effects, including an acute pain flare,post manipulation soreness and need for repeat treatments. Additionally, we specifically discussed the minimal risk of  injury to neurovascular structures associated with Cervical manipulation.NONE  Manipulation was performed as below:  Regions  Treated & Osteopathic Exam Findings   CERVICAL SPINE:  OA - rotated right C3 - 6 Extended, rotated RIGHT, sidebent RIGHT THORACIC SPINE:   T4 - 6 Neutral, rotated RIGHT, sidebent LEFT PELVIS:   Right psoas spasm Right anterior innonimate    OMT Techniques Used:  HVLA muscle energy myofascial release    The patient tolerated the treatment well and reported Improved symptoms following treatment today. Patient was given medications, exercises, stretches and lifestyle modifications per AVS and verbally.       PLAN:  Pertinent additional documentation may be included in corresponding procedure notes, imaging studies, problem based documentation and patient instructions.  No problem-specific Assessment & Plan notes found for this encounter.   Functional low back pain,, jarring type motion.  He responded well to long lever today with osteopathic manipulation.  IT sales professional Program: These exercises were developed by Myles Lipps, DC with a strong emphasis on core neuromuscular reducation and postural realignment through body-weight exercises. Daily practice encouarged and links to [employer] provided today per Patient Instructions.   Home Therapeutic Exercises: New program prescribed today per procedure note.  Discussed the underlying features of tight hip flexors leading to crouched, fetal like position that results in spinal column compression.  Including lumbar hyperflexion with hypermobility, thoracic flexion with restrictive rotation and cervical lordosis reversal.   Osteopathic manipulation was performed today based on physical exam findings.  Patient was counseled on the purpose and expected outcome of osteopathic manipulation and understands that a single treatment may not provide permanent long lasting relief.  They understand that home therapeutic exercises are critical part of the healing/treatment process and will continue with self treatment between now  and their next visit as outlined.  The patient understands that the frequency of visits is meant to provide a stimulus to promote the body's own ability to heal and is not meant to be the sole means for improvement in their symptoms.  Activity modifications and the importance of avoiding exacerbating activities (limiting pain to no more than a 4 / 10 during or following activity) recommended and discussed.   Discussed red flag symptoms that warrant earlier emergent evaluation and patient voices understanding.    Meds ordered this encounter  Medications   methylPREDNISolone (MEDROL DOSEPAK) 4 MG TBPK tablet    Sig: Take by mouth as directed. Take 6 tablets on the first day prescribed then as directed.    Dispense:  21 tablet    Refill:  0   cyclobenzaprine (FLEXERIL) 10 MG tablet    Sig: Take 1 tablet (10 mg total) by mouth 3 (three) times daily as needed for muscle spasms.    Dispense:  30 tablet    Refill:  1   Lab Orders  No laboratory test(s) ordered today   Imaging Orders  No imaging studies ordered today   Referral Orders  No referral(s) requested today    At follow up will plan to consider: repeat osteopathic manipulation  Return in about 4 weeks (around 02/04/2019) for consideration of repeat Osteopathic Manipulation.          Andrena Mews, DO    [facility]

## 2019-01-10 ENCOUNTER — Encounter: Payer: Self-pay | Admitting: Sports Medicine

## 2019-02-09 ENCOUNTER — Telehealth: Payer: Self-pay | Admitting: Family Medicine

## 2019-02-09 DIAGNOSIS — A6000 Herpesviral infection of urogenital system, unspecified: Secondary | ICD-10-CM

## 2019-02-09 NOTE — Telephone Encounter (Signed)
Copied from CRM [id number]. Topic: Quick Communication - Rx Refill/Question >> Feb 09, 2019  5:34 PM Rica Koyanagi, Barbee Cough wrote: Medication: valACYclovir (VALTREX) 500 MG tablet  Has the patient contacted their pharmacy? Yes.   (Agent: If no, request that the patient contact the pharmacy for the refill.) (Agent: If yes, when and what did the pharmacy advise?)  Preferred Pharmacy (with phone number or street name): [facility] [address] Pt has upcoming appt  05/04 but needs meds before then   Agent: Please be advised that RX refills may take up to 3 business days. We ask that you follow-up with your pharmacy.

## 2019-02-10 MED ORDER — VALACYCLOVIR HCL 500 MG PO TABS: 500.0000 mg | ORAL_TABLET | Freq: Two times a day (BID) | ORAL | 0 refills | Status: DC

## 2019-02-10 NOTE — Telephone Encounter (Signed)
See note

## 2019-02-10 NOTE — Telephone Encounter (Signed)
Left message for pt to return phone call for update.

## 2019-02-10 NOTE — Telephone Encounter (Signed)
Pt called back and is aware it was sent to pharmacy

## 2019-02-10 NOTE — Telephone Encounter (Signed)
Rx sent in electronically.  

## 2019-02-15 ENCOUNTER — Ambulatory Visit: Payer: Self-pay | Admitting: Family Medicine

## 2019-02-19 ENCOUNTER — Ambulatory Visit (INDEPENDENT_AMBULATORY_CARE_PROVIDER_SITE_OTHER): Payer: BLUE CROSS/BLUE SHIELD | Admitting: Family Medicine

## 2019-02-19 ENCOUNTER — Encounter: Payer: Self-pay | Admitting: Family Medicine

## 2019-02-19 VITALS — BP 145/95 | Ht 70.0 in

## 2019-02-19 DIAGNOSIS — A6000 Herpesviral infection of urogenital system, unspecified: Secondary | ICD-10-CM | POA: Diagnosis not present

## 2019-02-19 DIAGNOSIS — F1410 Cocaine abuse, uncomplicated: Secondary | ICD-10-CM

## 2019-02-19 DIAGNOSIS — K922 Gastrointestinal hemorrhage, unspecified: Secondary | ICD-10-CM

## 2019-02-19 DIAGNOSIS — I10 Essential (primary) hypertension: Secondary | ICD-10-CM | POA: Diagnosis not present

## 2019-02-19 DIAGNOSIS — M545 Low back pain, unspecified: Secondary | ICD-10-CM

## 2019-02-19 DIAGNOSIS — F1020 Alcohol dependence, uncomplicated: Secondary | ICD-10-CM

## 2019-02-19 MED ORDER — LISINOPRIL-HYDROCHLOROTHIAZIDE 20-12.5 MG PO TABS: 1.0000 | ORAL_TABLET | Freq: Every day | ORAL | 1 refills | Status: DC

## 2019-02-19 MED ORDER — VALACYCLOVIR HCL 500 MG PO TABS: 500.0000 mg | ORAL_TABLET | Freq: Two times a day (BID) | ORAL | 2 refills | Status: DC

## 2019-02-19 MED ORDER — DICLOFENAC SODIUM 1 % TD GEL: 2.0000 g | Freq: Four times a day (QID) | TRANSDERMAL | 3 refills | Status: DC

## 2019-02-19 NOTE — Assessment & Plan Note (Signed)
#  hypertension S: Patient lost insurance-last seen here in 2018.  Blood pressure has been poorly controlled on home numbers.  Last time we saw him he was on lisinopril hydrochlorothiazide 20-12.5 mg BP Readings from Last 3 Encounters:  02/19/19 (!) 145/95  01/07/19 120/80  09/08/18 (!) 147/92  A/P: Restart lisinopril hydrochlorothiazide 20-12.5 mg and encouraged 1 month follow-up-potentially video versus in person.

## 2019-02-19 NOTE — Progress Notes (Signed)
Phone [phone]   Subjective:  Virtual visit via Video note. Chief complaint: Chief Complaint  Patient presents with  . Hypertension    follow up   This visit type was conducted due to national recommendations for restrictions regarding the COVID-19 Pandemic (e.g. social distancing).  This format is felt to be most appropriate for this patient at this time balancing risks to patient and risks to population by having him in for in person visit.  No physical exam was performed (except for noted visual exam or audio findings with Telehealth visits).    Our team/I connected with Gabriel Garcia on 02/19/19 at  3:40 PM EDT by a video enabled telemedicine application ([email or web address]) and verified that I am speaking with the correct person using two identifiers.  Location patient: [facility] Location provider[facility] office Persons participating in the virtual visit:  patient  Our team/I discussed the limitations of evaluation and management by telemedicine and the availability of in person appointments. In light of current covid-19 pandemic, patient also understands that we are trying to protect them by minimizing in office contact if at all possible.  The patient expressed consent for telemedicine visit and agreed to proceed. Patient understands insurance will be billed.   ROS-  some low back pain, no fever, chills, cough.    Past Medical History-  Patient Active Problem List   Diagnosis Date Noted  . Mallory-Weiss tear 12/03/2016    Priority: High  . Cocaine abuse (HCC) 11/16/2015    Priority: High  . Alcoholism (HCC) 11/16/2015    Priority: High  . Hepatitis C     Priority: High  . Genital herpes 09/12/2016    Priority: Medium  . OSA (obstructive sleep apnea) 05/28/2016    Priority: Medium  . Gout 11/16/2015    Priority: Medium  . Acute duodenal ulcer with hemorrhage 10/12/2014    Priority: Medium  . Hypertension 10/06/2014    Priority: Medium  . BPPV (benign paroxysmal  positional vertigo) 09/12/2016    Priority: Low  . Obese 11/17/2015    Priority: Low  . Legally blind in right eye, as defined in [place]     Priority: Low  . Esophageal reflux 11/24/2013    Priority: Low  . Anemia 09/06/2018  . Upper GI bleed 08/31/2018  . Atypical chest pain 06/18/2018  . AKI (acute kidney injury) (HCC) 04/22/2016  . EKG abnormality 10/12/2014    Medications- reviewed and updated Current Outpatient Medications  Medication Sig Dispense Refill  . valACYclovir (VALTREX) 500 MG tablet Take 1 tablet (500 mg total) by mouth 2 (two) times daily. For 3 days with outbreaks 30 tablet 2  . diclofenac sodium (VOLTAREN) 1 % GEL Apply 2 g topically 4 (four) times daily. For low back pain 100 g 3  . lisinopril-hydrochlorothiazide (ZESTORETIC) 20-12.5 MG tablet Take 1 tablet by mouth daily. 90 tablet 1   No current facility-administered medications for this visit.      Objective:  BP (!) 145/95   Ht 5\' 10"  (1.778 m)   BMI 34.35 kg/m  self reported vitals Gen: NAD, resting comfortably in his car outside his home Lungs: nonlabored, normal respiratory rate  Skin: appears dry, no obvious rash     Assessment and Plan   # Low back pain S:patient complains of right and left low back pain- in the mornings it feels like an intense/painful/burning stretch sensation- has to get up and move around- getting in shower helps.  Back in March with Dr. Carlean Purligby-was  treated with steroids orally without relief- bothers him throughout the day. Has tried new pillows, heating pad, new mattress topper.   Ibuprofen helps but he cannot get to moving. FLexeril has not been helpful.  A/P: 43 year old male with low back pain-previously treated by Dr. Berline Chough of sports medicine with steroids.  With Dr. Berline Chough no longer available for consult-referral placed to Dr. Katrinka Blazing of sports medicine.  We will trial Voltaren gel.  Patient asked for stronger medication-with alcohol and cocaine consumption history though 100  days free-I told him I would prefer to try conservative therapies first- may trial low-level tramadol potentially if needed.  Flexeril has not been very helpful.  # Genital herpes S:stress higher recently- has had 5 breakouts this year  A/P: We discussed possible suppressive therapy but patient is not interested at this time-he would just like to have medication available for flareups- refill of Valtrex was filled today   Cocaine abuse (HCC) S: Last cocaine use was 100 days ago-he states he had to go back to treatment.  He admits this is an ongoing issue for him but feels he is in a good place right now A/P: Congratulated patient on being 100 days cocaine free encouraged continued cessation  Alcoholism (HCC) S: Last alcohol use was 100 days ago which was the last cocaine use as well-he states he had to go back to treatment.  He admits this is an ongoing issue for him but feels he is in a good place right now A/P: Congratulated patient on being 100 days alcohol free-encouraged continued cessation  Hypertension #hypertension S: Patient lost insurance-last seen here in 2018.  Blood pressure has been poorly controlled on home numbers.  Last time we saw him he was on lisinopril hydrochlorothiazide 20-12.5 mg BP Readings from Last 3 Encounters:  02/19/19 (!) 145/95  01/07/19 120/80  09/08/18 (!) 147/92  A/P: Restart lisinopril hydrochlorothiazide 20-12.5 mg and encouraged 1 month follow-up-potentially video versus in person.    Upper GI bleed Due to history of GI bleed and anemia- strongly recommended updated labs.  He has been off Protonix for some time-we will get GI opinion on this.  Referred him back to [facility].  They also previously were monitoring prior hepatitis C   No future appointments. No follow-ups on file.  Lab/Order associations: Bilateral low back pain without sciatica, unspecified chronicity - Plan: Ambulatory referral to Sports Medicine  Genital herpes simplex,  unspecified site - Plan: valACYclovir (VALTREX) 500 MG tablet  Gastrointestinal hemorrhage, unspecified gastrointestinal hemorrhage type - Plan: Ambulatory referral to Gastroenterology  Essential hypertension - Plan: Comprehensive metabolic panel, CBC with Differential/Platelet, Lipid panel  Cocaine abuse (HCC)  Alcoholism (HCC)  Upper GI bleed  He will call back for labs-Please note-encouraged patient to wear a mask when he comes in for labs and make sure to be COVID-19 symptom-free Meds ordered this encounter  Medications  . diclofenac sodium (VOLTAREN) 1 % GEL    Sig: Apply 2 g topically 4 (four) times daily. For low back pain    Dispense:  100 g    Refill:  3  . lisinopril-hydrochlorothiazide (ZESTORETIC) 20-12.5 MG tablet    Sig: Take 1 tablet by mouth daily.    Dispense:  90 tablet    Refill:  1  . valACYclovir (VALTREX) 500 MG tablet    Sig: Take 1 tablet (500 mg total) by mouth 2 (two) times daily. For 3 days with outbreaks    Dispense:  30 tablet    Refill:  2   Return precautions advised.  Tana Conch, MD

## 2019-02-19 NOTE — Patient Instructions (Addendum)
Health Maintenance Due  Topic Date Due  . PNEUMOCOCCAL POLYSACCHARIDE VACCINE AGE 43-64 HIGH RISK  08/20/1978  . FOOT EXAM  08/20/1986  . URINE MICROALBUMIN  08/20/1986  . HEMOGLOBIN A1C  12/17/2018  Will discuss during visit.  Patient does not have diabetes so we remove diabetes modifier  Depression screen [facility] 2/9 02/19/2019  Decreased Interest 0  Down, Depressed, Hopeless 0  PHQ - 2 Score 0   Video visit

## 2019-02-19 NOTE — Assessment & Plan Note (Signed)
Due to history of GI bleed and anemia- strongly recommended updated labs.  He has been off Protonix for some time-we will get GI opinion on this.  Referred [facility] back to [facility].  They also previously were monitoring prior hepatitis C

## 2019-02-19 NOTE — Assessment & Plan Note (Signed)
S: Last alcohol use was 100 days ago which was the last cocaine use as well-he states he had to go back to treatment.  He admits this is an ongoing issue for him but feels he is in a good place right now A/P: Congratulated patient on being 100 days alcohol free-encouraged continued cessation

## 2019-02-19 NOTE — Assessment & Plan Note (Signed)
S: Last cocaine use was 100 days ago-he states he had to go back to treatment.  He admits this is an ongoing issue for him but feels he is in a good place right now A/P: Congratulated patient on being 100 days cocaine free encouraged continued cessation

## 2019-02-22 ENCOUNTER — Telehealth: Payer: Self-pay | Admitting: Family Medicine

## 2019-02-22 MED ORDER — TRAMADOL HCL 50 MG PO TABS: 50.0000 mg | ORAL_TABLET | Freq: Two times a day (BID) | ORAL | 0 refills | Status: DC | PRN

## 2019-02-22 NOTE — Telephone Encounter (Signed)
Forwarding to Dr. Hunter to advise.  

## 2019-02-22 NOTE — Telephone Encounter (Signed)
See note, patient was seen 02/19/19  Copied from CRM [id number]. Topic: Quick Communication - See Telephone Encounter >> Feb 22, 2019 11:35 AM [name] wrote: CRM for notification. See Telephone encounter for: 02/22/19.the patient would like something called in form back pain.He states he was hardly able to get out of bed this morning. He would like this sent to [id number] - [place], [place] - [address] AT [facility] [address] [phone] (Phone) [phone] (Fax)

## 2019-02-22 NOTE — Addendum Note (Signed)
Addended by: [name] on: 02/22/2019 08:47 PM   Modules accepted: Orders

## 2019-02-23 NOTE — Telephone Encounter (Signed)
Patient notified of results and verbalized understanding.  

## 2019-02-24 ENCOUNTER — Telehealth: Payer: Self-pay | Admitting: *Deleted

## 2019-02-24 NOTE — Telephone Encounter (Signed)
Copied from CRM [id number]. Topic: Quick Conservator, museum/gallery Patient (Clinic Use ONLY) >> Feb 23, 2019  4:49 PM Peace, Tammy L wrote: Please see referral for patient.  Dr. Durene Cal would like for patient to be seen this week if possible.

## 2019-02-24 NOTE — Telephone Encounter (Signed)
Left message for patient to call back to schedule appt with Dr. Smith. 

## 2019-02-25 NOTE — Telephone Encounter (Signed)
Left message to schedule an appointment with Dr. Katrinka Blazing

## 2019-03-02 ENCOUNTER — Encounter: Payer: Self-pay | Admitting: Family Medicine

## 2019-03-02 ENCOUNTER — Ambulatory Visit (INDEPENDENT_AMBULATORY_CARE_PROVIDER_SITE_OTHER): Payer: BLUE CROSS/BLUE SHIELD | Admitting: Family Medicine

## 2019-03-02 ENCOUNTER — Other Ambulatory Visit: Payer: Self-pay

## 2019-03-02 ENCOUNTER — Other Ambulatory Visit: Payer: Self-pay | Admitting: Family Medicine

## 2019-03-02 ENCOUNTER — Ambulatory Visit (INDEPENDENT_AMBULATORY_CARE_PROVIDER_SITE_OTHER)
Admission: RE | Admit: 2019-03-02 | Discharge: 2019-03-02 | Disposition: A | Discharge status: Home / Self Care | Payer: BLUE CROSS/BLUE SHIELD | Source: Ambulatory Visit | Attending: Family Medicine | Admitting: Family Medicine

## 2019-03-02 ENCOUNTER — Other Ambulatory Visit (INDEPENDENT_AMBULATORY_CARE_PROVIDER_SITE_OTHER): Payer: BLUE CROSS/BLUE SHIELD

## 2019-03-02 VITALS — BP 134/90 | HR 79 | Ht 70.0 in | Wt 243.0 lb

## 2019-03-02 DIAGNOSIS — G8929 Other chronic pain: Secondary | ICD-10-CM

## 2019-03-02 DIAGNOSIS — M2550 Pain in unspecified joint: Secondary | ICD-10-CM | POA: Diagnosis not present

## 2019-03-02 DIAGNOSIS — M545 Low back pain, unspecified: Secondary | ICD-10-CM

## 2019-03-02 DIAGNOSIS — I10 Essential (primary) hypertension: Secondary | ICD-10-CM | POA: Diagnosis not present

## 2019-03-02 DIAGNOSIS — M47812 Spondylosis without myelopathy or radiculopathy, cervical region: Secondary | ICD-10-CM | POA: Diagnosis not present

## 2019-03-02 DIAGNOSIS — M5441 Lumbago with sciatica, right side: Secondary | ICD-10-CM | POA: Diagnosis not present

## 2019-03-02 DIAGNOSIS — E785 Hyperlipidemia, unspecified: Secondary | ICD-10-CM | POA: Insufficient documentation

## 2019-03-02 LAB — IBC PANEL
Iron: 86 ug/dL (ref 42–165)
Saturation Ratios: 20.1 % (ref 20.0–50.0)
Transferrin: 305 mg/dL (ref 212.0–360.0)

## 2019-03-02 LAB — CBC WITH DIFFERENTIAL/PLATELET
Basophils Absolute: 0.1 10*3/uL (ref 0.0–0.1)
Basophils Relative: 1.4 % (ref 0.0–3.0)
Eosinophils Absolute: 0.1 10*3/uL (ref 0.0–0.7)
Eosinophils Relative: 1.7 % (ref 0.0–5.0)
HCT: 40.7 % (ref 39.0–52.0)
Hemoglobin: 13.4 g/dL (ref 13.0–17.0)
Lymphocytes Relative: 26.6 % (ref 12.0–46.0)
Lymphs Abs: 1.7 10*3/uL (ref 0.7–4.0)
MCHC: 33 g/dL (ref 30.0–36.0)
MCV: 82.3 fl (ref 78.0–100.0)
Monocytes Absolute: 0.4 10*3/uL (ref 0.1–1.0)
Monocytes Relative: 5.8 % (ref 3.0–12.0)
Neutro Abs: 4.2 10*3/uL (ref 1.4–7.7)
Neutrophils Relative %: 64.5 % (ref 43.0–77.0)
Platelets: 255 10*3/uL (ref 150.0–400.0)
RBC: 4.94 Mil/uL (ref 4.22–5.81)
RDW: 18.7 % — ABNORMAL HIGH (ref 11.5–15.5)
WBC: 6.5 10*3/uL (ref 4.0–10.5)

## 2019-03-02 LAB — TSH: TSH: 1.36 u[IU]/mL (ref 0.35–4.50)

## 2019-03-02 LAB — COMPREHENSIVE METABOLIC PANEL
ALT: 16 U/L (ref 0–53)
AST: 16 U/L (ref 0–37)
Albumin: 4.4 g/dL (ref 3.5–5.2)
Alkaline Phosphatase: 51 U/L (ref 39–117)
BUN: 9 mg/dL (ref 6–23)
CO2: 28 mEq/L (ref 19–32)
Calcium: 9.8 mg/dL (ref 8.4–10.5)
Chloride: 104 mEq/L (ref 96–112)
Creatinine, Ser: 1.1 mg/dL (ref 0.40–1.50)
GFR: 73.22 mL/min (ref >60.00)
Glucose, Bld: 107 mg/dL — ABNORMAL HIGH (ref 70–99)
Potassium: 4 mEq/L (ref 3.5–5.1)
Sodium: 139 mEq/L (ref 135–145)
Total Bilirubin: 0.6 mg/dL (ref 0.2–1.2)
Total Protein: 7.5 g/dL (ref 6.0–8.3)

## 2019-03-02 LAB — LIPID PANEL
Cholesterol: 205 mg/dL — ABNORMAL HIGH (ref 0–200)
HDL: 39.9 mg/dL (ref >39.00)
LDL Cholesterol: 136 mg/dL — ABNORMAL HIGH (ref 0–99)
NonHDL: 165.34
Total CHOL/HDL Ratio: 5
Triglycerides: 145 mg/dL (ref 0.0–149.0)
VLDL: 29 mg/dL (ref 0.0–40.0)

## 2019-03-02 LAB — SEDIMENTATION RATE: Sed Rate: 21 mm/hr — ABNORMAL HIGH (ref 0–15)

## 2019-03-02 LAB — FERRITIN: Ferritin: 7.2 ng/mL — ABNORMAL LOW (ref 22.0–322.0)

## 2019-03-02 LAB — URIC ACID: Uric Acid, Serum: 9.8 mg/dL — ABNORMAL HIGH (ref 4.0–7.8)

## 2019-03-02 MED ORDER — DICLOFENAC SODIUM 2 % TD SOLN: 2.0000 g | Freq: Two times a day (BID) | TRANSDERMAL | 3 refills | Status: DC

## 2019-03-02 NOTE — Telephone Encounter (Signed)
Refill done.  

## 2019-03-02 NOTE — Patient Instructions (Signed)
Good to see you.  Ice 20 minutes 2 times daily. Usually after activity and before bed. pennsaid pinkie amount topically 2 times daily as needed. This will be mailed to you  Exercises 3 times a week.  Xray downstairs today  Labs I am adding with Dr. Hunter \\Over  the counter get  Vitamin D 2000 IU daily  See me again in 4 weeks Be safe!

## 2019-03-02 NOTE — Progress Notes (Signed)
Tawana ScaleZach Garcia D.O. [facility] [address] [place], [place] [address] Phone: [phone] Subjective:   I Ronelle NighKana Thompson am serving as a Neurosurgeonscribe for Dr. Antoine PrimasZachary Garcia.    CC: Back and hip pain.  Patient states that has been more in the back pain.  Sometimes radiation down the leg.  BJY:NWGNFAOZHYHPI:Subjective  Esmond Marlowe ShoresJason Gonet is a 43 y.o. male coming in with complaint of low back pain. Tingling in the right hip. Can't move when he wakes up in the morning. Feels like his back is burning and painful around the kidney area. Shooting pain up the middle of his back and neck. Experiences headaches. Feels a knot in the glut.   Onset- Chronic  Location- Lower back, right sided pain Duration-  Character- Aggravating factors- working (switched/yard truck) Reliving factors-  Therapies tried- Ice, heat, new mattress pad, pillow, icy hot, topical, tramadol Severity-8 out of 10 sometimes.  Patient would like to avoid having any true narcotics secondary to history of cocaine abuse.     Past Medical History:  Diagnosis Date  . Acute duodenal ulcer with hemorrhage 10/12/2014  . GERD (gastroesophageal reflux disease)   . GI bleed   . Hepatitis C   . Hypertension   . 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 Legally blind in right eye, [place] defined in [place]   . Mesenteric adenitis   . Sliding hiatal hernia   . Substance abuse (HCC)    cocaine  . Terminal ileitis [facility](HCC)    Past Surgical History:  Procedure Laterality Date  . BIOPSY  09/08/2018   Procedure: BIOPSY;  Surgeon: Bernette RedbirdBuccini, Robert, MD;  Location: [facility];  Service: Endoscopy;;  . ESOPHAGOGASTRODUODENOSCOPY N/A 10/13/2014   Procedure: ESOPHAGOGASTRODUODENOSCOPY (EGD);  Surgeon: Iva Booparl E Gessner, MD;  [facility];  Service: Endoscopy;  Laterality: N/A;  . ESOPHAGOGASTRODUODENOSCOPY N/A 11/17/2016   Procedure: ESOPHAGOGASTRODUODENOSCOPY (EGD);  Surgeon: Charlott RakesVincent Schooler, MD;  [facility];  Service:  Endoscopy;  Laterality: N/A;  . ESOPHAGOGASTRODUODENOSCOPY (EGD) WITH PROPOFOL N/A 09/01/2018   Procedure: ESOPHAGOGASTRODUODENOSCOPY (EGD) WITH PROPOFOL;  Surgeon: Carman ChingEdwards, James, MD;  [facility];  Service: Endoscopy;  Laterality: N/A;  . ESOPHAGOGASTRODUODENOSCOPY (EGD) WITH PROPOFOL N/A 09/08/2018   Procedure: ESOPHAGOGASTRODUODENOSCOPY (EGD) WITH PROPOFOL;  Surgeon: Bernette RedbirdBuccini, Robert, MD;  [facility]: [facility];  Service: Endoscopy;  Laterality: N/A;  . EYE SURGERY Right   . FOOT SURGERY Right   . LIVER BIOPSY     Dr. Kinnie ScalesMedoff GI   Social History   Socioeconomic History  . Marital status: Single    Spouse name: Not on file  . Number of children: 1  . Years of education: Not on file  . Highest education level: Not on file  Occupational History  . Occupation: dye cutter helper    Employer: [employer]  Social Needs  . Financial resource strain: Not on file  . Food insecurity:    Worry: Not on file    Inability: Not on file  . Transportation needs:    Medical: Not on file    Non-medical: Not on file  Tobacco Use  . Smoking status: Never Smoker  . Smokeless tobacco: Never Used  Substance and Sexual Activity  . Alcohol use: No    Comment: etoh free since 08/2015.  . Drug use: No    Comment: used cocaine before but has been clean since 08/2015.   [name]. Sexual activity: Yes  Lifestyle  . Physical activity:    Days  per week: Not on file    Minutes per session: Not on file  . Stress: Not on file  Relationships  . Social connections:    Talks on phone: Not on file    Gets together: Not on file    Attends religious service: Not on file    Active member of club or organization: Not on file    Attends meetings of clubs or organizations: Not on file    Relationship status: Not on file  Other Topics Concern  . Not on file  Social History Narrative   Single.  Lives with his mother.  1 daughter 41 years old in June 2017.       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 Going to concerts   No Known Allergies Family History  Problem Relation Age of Onset  . Rheum arthritis Mother   . Hypertension Mother   . Alcoholism Mother   . Heart failure Father        rheumatic heart disease, also drinking and smoking  . Alcoholism Father        died age 66  . Colon cancer Neg Hx      Current Outpatient Medications (Cardiovascular):  .  lisinopril-hydrochlorothiazide (ZESTORETIC) 20-12.5 MG tablet, Take 1 tablet by mouth daily.   Current Outpatient Medications (Analgesics):  .  traMADol (ULTRAM) 50 MG tablet, Take 1 tablet (50 mg total) by mouth every 12 (twelve) hours as needed for moderate pain or severe pain.   Current Outpatient Medications (Other):  .  diclofenac sodium (VOLTAREN) 1 % GEL, Apply 2 g topically 4 (four) times daily. For low back pain .  valACYclovir (VALTREX) 500 MG tablet, Take 1 tablet (500 mg total) by mouth 2 (two) times daily. For 3 days with outbreaks .  Diclofenac Sodium 1.5 % SOLN, Please specify directions, refills and quantity    Past medical history, social, surgical and family history all reviewed in electronic medical record.  No pertanent information unless stated regarding to the chief complaint.   Review of Systems:  No headache, visual changes, nausea, vomiting, diarrhea, constipation, dizziness, abdominal pain, skin rash, fevers, chills, night sweats, weight loss, swollen lymph nodes, body aches, joint swelling,  chest pain, shortness of breath, mood changes. Positive muscle aches    Objective  Blood pressure 134/90, pulse 79, height 5\' 10"  (1.778 m), weight 243 lb (110.2 kg), SpO2 98 %.   General: No apparent distress alert and oriented x3 mood and affect normal, dressed appropriately.  HEENT: Pupils equal, extraocular movements intact  Respiratory: Patient's speak in full sentences and does not appear short of breath  Cardiovascular: No lower extremity edema, non tender, no  erythema  Skin: Warm dry intact with no signs of infection or rash on extremities or on axial skeleton.  Abdomen: Soft nontender  Neuro: Cranial nerves II through XII are intact, neurovascularly intact in all extremities with 2+ DTRs and 2+ pulses.  Lymph: No lymphadenopathy of posterior or anterior cervical chain or axillae bilaterally.  Gait antalgic gait MSK:  tender with full range of motion and good stability and symmetric strength and tone of shoulders, elbows, wrist, hip, knee and ankles bilaterally.  Back Exam:  Inspection: Mild loss of lordosis Motion: Flexion 40 deg, Extension 25 deg, Side Bending to 35 deg bilaterally,  Rotation to 45 deg bilaterally  SLR laying: Negative  XSLR laying: Negative  Palpable tenderness: Tender to palpation paraspinal musculature  lumbar spine right greater than left. FABER: Severe tightness on the right. Sensory change: Gross sensation intact to all lumbar and sacral dermatomes.  Reflexes: 2+ at both patellar tendons, 2+ at achilles tendons, Babinski's downgoing.  Strength at foot  Plantar-flexion: 5/5 Dorsi-flexion: 5/5 Eversion: 5/5 Inversion: 5/5  Leg strength  Quad: 5/5 Hamstring: 5/5 Hip flexor: 5/5 Hip abductors: 5/5   [id number]; 15 additional minutes spent for Therapeutic exercises as stated in above notes.  This included exercises focusing on stretching, strengthening, with significant focus on eccentric aspects.   Long term goals include an improvement in range of motion, strength, endurance as well as avoiding reinjury. Patient's frequency would include in 1-2 times a day, 3-5 times a week for a duration of 6-12 weeks.Low back exercises that included:  Pelvic tilt/bracing instruction to focus on control of the pelvic girdle and lower abdominal muscles  Glute strengthening exercises, focusing on proper firing of the glutes without engaging the low back muscles Proper stretching techniques for maximum relief for the hamstrings, hip flexors, low  back and some rotation where tolerated    Proper technique shown and discussed handout in great detail with ATC.  All questions were discussed and answered.      Impression and Recommendations:     This case required medical decision making of moderate complexity. The above documentation has been reviewed and is accurate and complete Judi Saa, DO       Note: This dictation was prepared with Dragon dictation along with smaller phrase technology. Any transcriptional errors that result from this process are unintentional.

## 2019-03-02 NOTE — Assessment & Plan Note (Signed)
Patient is having a back pain with some radicular symptoms.  We discussed with patient in great length.  I do believe that this could be multifactorial.  X-rays ordered today for further evaluation.  Patient has a had a history of a GI bleed and we will check ferritin and iron deficiency to see if this is potentially playing a role as well.  We discussed icing regimen, core strengthening.  Which activities to do which wants to avoid.  In addition to this patient given some topical anti-inflammatories at home physical to be a little more beneficial.  Patient wanted to avoid a lot of other potential medications.  We discussed over-the-counter bowel prep that could also be beneficial as well.  Follow-up again 4 weeks

## 2019-03-03 LAB — TESTOSTERONE
Free Testosterone: 13.7
Sex Hormone Binding Globulin: 15.7
Testosterone, Serum: 225

## 2019-03-03 LAB — TESTOSTERONE, FREE, TOTAL, SHBG
Sex Hormone Binding: 15.7 nmol/L — ABNORMAL LOW (ref 16.5–55.9)
Testosterone, Free: 13.7 pg/mL (ref 6.8–21.5)
Testosterone: 225 ng/dL — ABNORMAL LOW (ref 264–916)

## 2019-03-03 LAB — SPECIMEN STATUS REPORT

## 2019-03-05 ENCOUNTER — Telehealth: Payer: Self-pay | Admitting: Family Medicine

## 2019-03-05 LAB — VITAMIN D 1,25 DIHYDROXY
Vitamin D 1, 25 (OH)2 Total: 27 pg/mL (ref 18–72)
Vitamin D2 1, 25 (OH)2: <8 pg/mL
Vitamin D3 1, 25 (OH)2: 27 pg/mL

## 2019-03-05 NOTE — Telephone Encounter (Signed)
Patient saw Dr. Katrinka Blazing on 03/02/2019.

## 2019-03-05 NOTE — Telephone Encounter (Signed)
Copied from CRM [id number]. Topic: General - Other >> Mar 05, 2019  8:46 AM Gwenlyn Fudge A wrote: Reason for CRM: Pt states he is waiting back on lab and xray results from 03/02/2019. He states he was told he was going to be called the day after. Please advise.

## 2019-03-05 NOTE — Telephone Encounter (Signed)
Pt calling back

## 2019-03-10 ENCOUNTER — Telehealth: Payer: Self-pay | Admitting: Family Medicine

## 2019-03-10 NOTE — Telephone Encounter (Signed)
Patient called and hung up before the call could be transferred to NT, the agent says he has additional questions about the lab results he received today.

## 2019-03-10 NOTE — Telephone Encounter (Signed)
See note

## 2019-03-10 NOTE — Telephone Encounter (Signed)
Patient had questions about his iron range.Patient informed and voices understanding.

## 2019-04-08 ENCOUNTER — Encounter: Payer: Self-pay | Admitting: Family Medicine

## 2019-04-29 ENCOUNTER — Emergency Department (HOSPITAL_COMMUNITY): Payer: BC Managed Care – PPO

## 2019-04-29 ENCOUNTER — Emergency Department (HOSPITAL_COMMUNITY)
Admission: EM | Admit: 2019-04-29 | Discharge: 2019-04-29 | Disposition: A | Discharge status: Home / Self Care | Payer: BC Managed Care – PPO | Attending: Emergency Medicine | Admitting: Emergency Medicine

## 2019-04-29 ENCOUNTER — Other Ambulatory Visit: Payer: Self-pay

## 2019-04-29 ENCOUNTER — Encounter (HOSPITAL_COMMUNITY): Payer: Self-pay

## 2019-04-29 DIAGNOSIS — R51 Headache: Secondary | ICD-10-CM | POA: Insufficient documentation

## 2019-04-29 DIAGNOSIS — J111 Influenza due to unidentified influenza virus with other respiratory manifestations: Secondary | ICD-10-CM

## 2019-04-29 DIAGNOSIS — I10 Essential (primary) hypertension: Secondary | ICD-10-CM | POA: Diagnosis not present

## 2019-04-29 DIAGNOSIS — R6883 Chills (without fever): Secondary | ICD-10-CM | POA: Insufficient documentation

## 2019-04-29 DIAGNOSIS — R5383 Other fatigue: Secondary | ICD-10-CM | POA: Diagnosis not present

## 2019-04-29 DIAGNOSIS — U071 COVID-19: Secondary | ICD-10-CM | POA: Insufficient documentation

## 2019-04-29 LAB — CBC WITH DIFFERENTIAL/PLATELET
Abs Immature Granulocytes: 0.01 10*3/uL (ref 0.00–0.07)
Basophils Absolute: 0 10*3/uL (ref 0.0–0.1)
Basophils Relative: 1 %
Eosinophils Absolute: 0 10*3/uL (ref 0.0–0.5)
Eosinophils Relative: 0 %
HCT: 43.4 % (ref 39.0–52.0)
Hemoglobin: 13.8 g/dL (ref 13.0–17.0)
Immature Granulocytes: 0 %
Lymphocytes Relative: 23 %
Lymphs Abs: 1.1 10*3/uL (ref 0.7–4.0)
MCH: 28.6 pg (ref 26.0–34.0)
MCHC: 31.8 g/dL (ref 30.0–36.0)
MCV: 89.9 fL (ref 80.0–100.0)
Monocytes Absolute: 0.7 10*3/uL (ref 0.1–1.0)
Monocytes Relative: 14 %
Neutro Abs: 3 10*3/uL (ref 1.7–7.7)
Neutrophils Relative %: 62 %
Platelets: 181 10*3/uL (ref 150–400)
RBC: 4.83 MIL/uL (ref 4.22–5.81)
RDW: 15.7 % — ABNORMAL HIGH (ref 11.5–15.5)
WBC: 4.9 10*3/uL (ref 4.0–10.5)
nRBC: 0 % (ref 0.0–0.2)

## 2019-04-29 LAB — SARS CORONAVIRUS 2 BY RT PCR (HOSPITAL ORDER, PERFORMED IN ~~LOC~~ HOSPITAL LAB): SARS Coronavirus 2: POSITIVE — AB

## 2019-04-29 LAB — COMPREHENSIVE METABOLIC PANEL
ALT: 21 U/L (ref 0–44)
AST: 17 U/L (ref 15–41)
Albumin: 4.1 g/dL (ref 3.5–5.0)
Alkaline Phosphatase: 46 U/L (ref 38–126)
Anion gap: 10 (ref 5–15)
BUN: 14 mg/dL (ref 6–20)
CO2: 24 mmol/L (ref 22–32)
Calcium: 9 mg/dL (ref 8.9–10.3)
Chloride: 104 mmol/L (ref 98–111)
Creatinine, Ser: 1.12 mg/dL (ref 0.61–1.24)
GFR calc Af Amer: >60 mL/min (ref >60)
GFR calc non Af Amer: >60 mL/min (ref >60)
Glucose, Bld: 94 mg/dL (ref 70–99)
Potassium: 3.5 mmol/L (ref 3.5–5.1)
Sodium: 138 mmol/L (ref 135–145)
Total Bilirubin: 0.5 mg/dL (ref 0.3–1.2)
Total Protein: 7.5 g/dL (ref 6.5–8.1)

## 2019-04-29 LAB — TROPONIN I (HIGH SENSITIVITY): Troponin I (High Sensitivity): 2 ng/L (ref <18)

## 2019-04-29 NOTE — Discharge Instructions (Signed)
You were seen in the emergency department today with flulike symptoms.  Your COVID-19 test has come back positive.  You need to isolate yourself from others, wear a mask, and to keep physical distance from your housemates.  You may take Tylenol as needed for discomfort and fever.  Please update your primary care doctor by phone to schedule a phone visit.  If you develop shortness of breath, chest pain, other worsening symptoms you may need to return to the emergency department for evaluation.

## 2019-04-29 NOTE — ED Notes (Signed)
MD/RN notified of lab result

## 2019-04-29 NOTE — ED Provider Notes (Signed)
Emergency Department Provider Note   I have reviewed the triage vital signs and the nursing notes.   HISTORY  Chief Complaint Fatigue   HPI Gabriel Garcia is a 43 y.o. male with PMH listed below currently living at [facility] in drug treatment presents to the emergency department for evaluation of flulike symptoms.  He states that a family member, cousin, tested positive for COVID-19 recently.  He had been in contact with this individual prior to their diagnosis.  Over the past several days he is developed headache, body ache, chills, cough, congestion.  He is noted some mild abdominal discomfort with occasional diarrhea.  No vomiting or nausea.  He describes a burning discomfort in his chest that is worse with coughing and radiating between the shoulder blades.  He is currently in drug treatment and [facility] room[facility].  He denies hearing of any symptoms there or any known outbreaks in that facility.   Past Medical History:  Diagnosis Date  . Acute duodenal ulcer with hemorrhage 10/12/2014  . GERD (gastroesophageal reflux disease)   . GI bleed   . Hepatitis C   . Hypertension   . 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 Legally blind in right eye, [place] defined in [place]   . Mesenteric adenitis   . Sliding hiatal hernia   . Substance abuse (HCC)    cocaine  . Terminal ileitis [facility](HCC)     Patient Active Problem List   Diagnosis Date Noted  . Low back pain 03/02/2019  . Hyperlipidemia, unspecified 03/02/2019  . Anemia 09/06/2018  . Upper GI bleed 08/31/2018  . Atypical chest pain 06/18/2018  . Mallory-Weiss tear 12/03/2016  . BPPV (benign paroxysmal positional vertigo) 09/12/2016  . Genital herpes 09/12/2016  . OSA (obstructive sleep apnea) 05/28/2016  . AKI (acute kidney injury) (HCC) 04/22/2016  . Obese 11/17/2015  . Cocaine abuse (HCC) 11/16/2015  . Alcoholism (HCC) 11/16/2015  . Gout 11/16/2015  . Legally blind in right eye,  as defined in [place]   . Acute duodenal ulcer with hemorrhage 10/12/2014  . EKG abnormality 10/12/2014  . Hypertension 10/06/2014  . Esophageal reflux 11/24/2013  . Hepatitis C     Past Surgical History:  Procedure Laterality Date  . BIOPSY  09/08/2018   Procedure: BIOPSY;  Surgeon: Bernette RedbirdBuccini, Robert, MD;  [facility];  Service: Endoscopy;;  . ESOPHAGOGASTRODUODENOSCOPY N/A 10/13/2014   Procedure: ESOPHAGOGASTRODUODENOSCOPY (EGD);  Surgeon: Iva Booparl E Gessner, MD;  [facility];  Service: Endoscopy;  Laterality: N/A;  . ESOPHAGOGASTRODUODENOSCOPY N/A 11/17/2016   Procedure: ESOPHAGOGASTRODUODENOSCOPY (EGD);  Surgeon: Charlott RakesVincent Schooler, MD;  [facility];  Service: Endoscopy;  Laterality: N/A;  . ESOPHAGOGASTRODUODENOSCOPY (EGD) WITH PROPOFOL N/A 09/01/2018   Procedure: ESOPHAGOGASTRODUODENOSCOPY (EGD) WITH PROPOFOL;  Surgeon: Carman ChingEdwards, James, MD;  [facility];  Service: Endoscopy;  Laterality: N/A;  . ESOPHAGOGASTRODUODENOSCOPY (EGD) WITH PROPOFOL N/A 09/08/2018   Procedure: ESOPHAGOGASTRODUODENOSCOPY (EGD) WITH PROPOFOL;  Surgeon: Bernette RedbirdBuccini, Robert, MD;  [facility];  Service: Endoscopy;  Laterality: N/A;  . EYE SURGERY Right   . FOOT SURGERY Right   . LIVER BIOPSY     Dr. Kinnie ScalesMedoff GI    Allergies Patient has no known allergies.  Family History  Problem Relation Age of Onset  . Rheum arthritis Mother   . Hypertension Mother   . Alcoholism Mother   . Heart failure Father        rheumatic heart disease, also drinking and  smoking  . Alcoholism Father        died age 39  . Colon cancer Neg Hx     Social History Social History   Tobacco Use  . Smoking status: Never Smoker  . Smokeless tobacco: Never Used  Substance Use Topics  . Alcohol use: No    Comment: etoh free since 08/2015.  . Drug use: No    Comment: used cocaine before but has been clean since 08/2015.     Review of Systems  Constitutional: Positive fever/chills and body  aches.  Eyes: No visual changes. ENT: No sore throat. Cardiovascular: Positive chest pain. Respiratory: Denies shortness of breath. Positive cough.  Gastrointestinal: Positive mild abdominal pain.  No nausea, no vomiting. Positive diarrhea.  No constipation. Genitourinary: Negative for dysuria. Musculoskeletal: Positive diffuse body aches.  Skin: Negative for rash. Neurological: Negative for focal weakness or numbness. Positive HA.   10-point ROS otherwise negative.  ____________________________________________   PHYSICAL EXAM:  VITAL SIGNS: ED Triage Vitals  Enc Vitals Group     BP 04/29/19 0853 (!) 143/95     Pulse Rate 04/29/19 0854 78     Resp 04/29/19 0854 17     Temp 04/29/19 0854 97.7 F (36.5 C)     Temp Source 04/29/19 0854 Oral     SpO2 04/29/19 0854 97 %     Weight 04/29/19 0919 242 lb 8.1 oz (110 kg)     Height 04/29/19 0919 5\' 8"  (1.727 m)   Constitutional: Alert and oriented. Well appearing and in no acute distress. Eyes: Conjunctivae are normal.  Head: Atraumatic. Nose: No congestion/rhinnorhea. Mouth/Throat: Mucous membranes are moist.   Neck: No stridor.   Cardiovascular: Normal rate, regular rhythm. Good peripheral circulation. Grossly normal heart sounds.   Respiratory: Normal respiratory effort.  No retractions. Lungs CTAB. Gastrointestinal: Soft and nontender. No distention.  Musculoskeletal: No gross deformities of extremities. Neurologic:  Normal speech and language.  Skin:  Skin is warm, dry and intact. No rash noted.  ____________________________________________   LABS (all labs ordered are listed, but only abnormal results are displayed)  Labs Reviewed  SARS CORONAVIRUS 2 (HOSPITAL ORDER, [place] [facility]) - Abnormal; Notable for the following components:      Result Value   SARS Coronavirus 2 POSITIVE (*)    All other components within normal limits  CBC WITH DIFFERENTIAL/PLATELET - Abnormal; Notable for the  following components:   RDW 15.7 (*)    All other components within normal limits  COMPREHENSIVE METABOLIC PANEL  TROPONIN I (HIGH SENSITIVITY)  TROPONIN I (HIGH SENSITIVITY)   ____________________________________________  EKG   EKG Interpretation  Date/Time:  Thursday April 29 2019 08:57:44 EDT Ventricular Rate:  73 PR Interval:    QRS Duration: 106 QT Interval:  388 QTC Calculation: 428 R Axis:   53 Text Interpretation:  Sinus rhythm Abnormal inferior Q waves Baseline wander in lead(s) II III aVF No STEMI  Confirmed by Nanda Quinton [phone]) on 04/29/2019 9:41:34 AM        ____________________________________________  RADIOLOGY  Dg Chest Portable 1 View  Result Date: 04/29/2019 CLINICAL DATA:  43 year old with fatigue, headaches and chills. Family member with COVID-19. Current history of hypertension and hepatitis C. EXAM: PORTABLE CHEST 1 VIEW COMPARISON:  09/06/2018 and earlier. FINDINGS: Cardiac silhouette normal in size for AP portable technique. Thoracic aorta minimally atherosclerotic. Hilar and mediastinal contours otherwise unremarkable. Lungs clear. Bronchovascular markings normal. Pulmonary vascularity normal. No visible pleural effusions. No pneumothorax. IMPRESSION:  No acute cardiopulmonary disease. Electronically Signed   By: Hulan Saashomas  Lawrence M.D.   On: 04/29/2019 10:41    ____________________________________________   PROCEDURES  Procedure(s) performed:   Procedures  None ____________________________________________   INITIAL IMPRESSION / ASSESSMENT AND PLAN / ED COURSE  Pertinent labs & imaging results that were available during my care of the patient were reviewed by me and considered in my medical decision making (see chart for details).   Patient presents to the emergency department for evaluation of COVID-like symptoms after known exposure to a family member who tested positive.  He is currently in a drug treatment/recovery center [facility]  and living with 4 roommates.  He is well-appearing and speaking in full sentences.  No apparent respiratory distress.  No hypoxemia.  Patient does have several medical comorbidities.  He is also describing some chest discomfort.  Given this plan for lab work including troponin along with chest x-ray.  I do plan to send the rapid COVID test given the patient's living situation.   COVID test resulting positive. CXR without infiltrate. Labs reviewed. No hypoxemia or SOB symptoms. Patient to alert his 5 house-mates and maintain isolation and distance for at least the next two weeks. Encourage other roommates to be tested and isolate. Discussed ED return precautions and provided work note.  ____________________________________________  FINAL CLINICAL IMPRESSION(S) / ED DIAGNOSES  Final diagnoses:  COVID-19 virus infection  Influenza-like illness    Note:  This document was prepared using Dragon voice recognition software and may include unintentional dictation errors.  Alona BeneJoshua Long, MD Emergency Medicine    Long, Arlyss RepressJoshua G, MD 04/29/19 [phone]

## 2019-04-29 NOTE — ED Triage Notes (Signed)
Pt arrived POV, states he has a family member that is COVID +, states he is having similar symptoms of fatigue, headache, and chills.

## 2019-05-01 ENCOUNTER — Encounter (HOSPITAL_COMMUNITY): Payer: Self-pay

## 2019-05-01 ENCOUNTER — Emergency Department (HOSPITAL_COMMUNITY): Payer: BC Managed Care – PPO

## 2019-05-01 ENCOUNTER — Other Ambulatory Visit: Payer: Self-pay

## 2019-05-01 ENCOUNTER — Observation Stay (HOSPITAL_COMMUNITY)
Admission: EM | Admit: 2019-05-01 | Discharge: 2019-05-02 | Disposition: A | Discharge status: Home / Self Care | Payer: BC Managed Care – PPO | Attending: Internal Medicine | Admitting: Internal Medicine

## 2019-05-01 DIAGNOSIS — Z79899 Other long term (current) drug therapy: Secondary | ICD-10-CM | POA: Diagnosis not present

## 2019-05-01 DIAGNOSIS — E669 Obesity, unspecified: Secondary | ICD-10-CM | POA: Diagnosis present

## 2019-05-01 DIAGNOSIS — U071 COVID-19: Secondary | ICD-10-CM | POA: Diagnosis not present

## 2019-05-01 DIAGNOSIS — M545 Low back pain: Secondary | ICD-10-CM | POA: Diagnosis not present

## 2019-05-01 DIAGNOSIS — F1410 Cocaine abuse, uncomplicated: Secondary | ICD-10-CM | POA: Insufficient documentation

## 2019-05-01 DIAGNOSIS — I10 Essential (primary) hypertension: Secondary | ICD-10-CM | POA: Diagnosis present

## 2019-05-01 DIAGNOSIS — B1920 Unspecified viral hepatitis C without hepatic coma: Secondary | ICD-10-CM | POA: Diagnosis present

## 2019-05-01 DIAGNOSIS — R0902 Hypoxemia: Secondary | ICD-10-CM | POA: Diagnosis not present

## 2019-05-01 DIAGNOSIS — K219 Gastro-esophageal reflux disease without esophagitis: Secondary | ICD-10-CM | POA: Insufficient documentation

## 2019-05-01 DIAGNOSIS — G4733 Obstructive sleep apnea (adult) (pediatric): Secondary | ICD-10-CM | POA: Diagnosis present

## 2019-05-01 DIAGNOSIS — Z6831 Body mass index (BMI) 31.0-31.9, adult: Secondary | ICD-10-CM | POA: Insufficient documentation

## 2019-05-01 DIAGNOSIS — J9601 Acute respiratory failure with hypoxia: Secondary | ICD-10-CM | POA: Diagnosis not present

## 2019-05-01 DIAGNOSIS — J1289 Other viral pneumonia: Secondary | ICD-10-CM | POA: Insufficient documentation

## 2019-05-01 DIAGNOSIS — E785 Hyperlipidemia, unspecified: Secondary | ICD-10-CM | POA: Insufficient documentation

## 2019-05-01 DIAGNOSIS — F1020 Alcohol dependence, uncomplicated: Secondary | ICD-10-CM | POA: Diagnosis not present

## 2019-05-01 DIAGNOSIS — G8929 Other chronic pain: Secondary | ICD-10-CM | POA: Diagnosis not present

## 2019-05-01 DIAGNOSIS — H548 Legal blindness, as defined in USA: Secondary | ICD-10-CM | POA: Diagnosis not present

## 2019-05-01 DIAGNOSIS — R0602 Shortness of breath: Secondary | ICD-10-CM | POA: Diagnosis not present

## 2019-05-01 LAB — COMPREHENSIVE METABOLIC PANEL
ALT: 20 U/L (ref 0–44)
AST: 19 U/L (ref 15–41)
Albumin: 4.2 g/dL (ref 3.5–5.0)
Alkaline Phosphatase: 48 U/L (ref 38–126)
Anion gap: 10 (ref 5–15)
BUN: 14 mg/dL (ref 6–20)
CO2: 23 mmol/L (ref 22–32)
Calcium: 9 mg/dL (ref 8.9–10.3)
Chloride: 103 mmol/L (ref 98–111)
Creatinine, Ser: 1.12 mg/dL (ref 0.61–1.24)
GFR calc Af Amer: >60 mL/min (ref >60)
GFR calc non Af Amer: >60 mL/min (ref >60)
Glucose, Bld: 115 mg/dL — ABNORMAL HIGH (ref 70–99)
Potassium: 3.5 mmol/L (ref 3.5–5.1)
Sodium: 136 mmol/L (ref 135–145)
Total Bilirubin: 0.5 mg/dL (ref 0.3–1.2)
Total Protein: 7.8 g/dL (ref 6.5–8.1)

## 2019-05-01 LAB — CBC WITH DIFFERENTIAL/PLATELET
Abs Immature Granulocytes: 0.01 10*3/uL (ref 0.00–0.07)
Basophils Absolute: 0 10*3/uL (ref 0.0–0.1)
Basophils Relative: 1 %
Eosinophils Absolute: 0.1 10*3/uL (ref 0.0–0.5)
Eosinophils Relative: 2 %
HCT: 44.9 % (ref 39.0–52.0)
Hemoglobin: 14.5 g/dL (ref 13.0–17.0)
Immature Granulocytes: 0 %
Lymphocytes Relative: 40 %
Lymphs Abs: 1.5 10*3/uL (ref 0.7–4.0)
MCH: 28.8 pg (ref 26.0–34.0)
MCHC: 32.3 g/dL (ref 30.0–36.0)
MCV: 89.3 fL (ref 80.0–100.0)
Monocytes Absolute: 0.3 10*3/uL (ref 0.1–1.0)
Monocytes Relative: 9 %
Neutro Abs: 1.8 10*3/uL (ref 1.7–7.7)
Neutrophils Relative %: 48 %
Platelets: 190 10*3/uL (ref 150–400)
RBC: 5.03 MIL/uL (ref 4.22–5.81)
RDW: 15.7 % — ABNORMAL HIGH (ref 11.5–15.5)
WBC: 3.8 10*3/uL — ABNORMAL LOW (ref 4.0–10.5)
nRBC: 0 % (ref 0.0–0.2)

## 2019-05-01 LAB — CBC
HCT: 44.7 % (ref 39.0–52.0)
Hemoglobin: 14.5 g/dL (ref 13.0–17.0)
MCH: 28.7 pg (ref 26.0–34.0)
MCHC: 32.4 g/dL (ref 30.0–36.0)
MCV: 88.5 fL (ref 80.0–100.0)
Platelets: 199 10*3/uL (ref 150–400)
RBC: 5.05 MIL/uL (ref 4.22–5.81)
RDW: 15.8 % — ABNORMAL HIGH (ref 11.5–15.5)
WBC: 3.6 10*3/uL — ABNORMAL LOW (ref 4.0–10.5)
nRBC: 0 % (ref 0.0–0.2)

## 2019-05-01 LAB — D-DIMER, QUANTITATIVE: D-Dimer, Quant: <0.27 ug/mL-FEU (ref 0.00–0.50)

## 2019-05-01 LAB — SEDIMENTATION RATE: Sed Rate: 14 mm/hr (ref 0–16)

## 2019-05-01 LAB — PROCALCITONIN: Procalcitonin: <0.1 ng/mL

## 2019-05-01 LAB — CREATININE, SERUM
Creatinine, Ser: 1.08 mg/dL (ref 0.61–1.24)
GFR calc Af Amer: >60 mL/min (ref >60)
GFR calc non Af Amer: >60 mL/min (ref >60)

## 2019-05-01 LAB — ABO/RH: ABO/RH(D): A POS

## 2019-05-01 LAB — FERRITIN: Ferritin: 24 ng/mL (ref 24–336)

## 2019-05-01 LAB — LACTATE DEHYDROGENASE: LDH: 110 U/L (ref 98–192)

## 2019-05-01 MED ORDER — ALBUTEROL SULFATE HFA 108 (90 BASE) MCG/ACT IN AERS
2.0000 | INHALATION_SPRAY | RESPIRATORY_TRACT | Status: DC | PRN
  Administered 2019-05-01: 2 via RESPIRATORY_TRACT
  Filled 2019-05-01: qty 6.7

## 2019-05-01 MED ORDER — HYDROCODONE-ACETAMINOPHEN 5-325 MG PO TABS
1.0000 | ORAL_TABLET | Freq: Once | ORAL | Status: AC
  Administered 2019-05-01: 1 via ORAL
  Filled 2019-05-01: qty 1

## 2019-05-01 MED ORDER — PREDNISONE 20 MG PO TABS
60.0000 mg | ORAL_TABLET | Freq: Once | ORAL | Status: AC
  Administered 2019-05-01: 60 mg via ORAL
  Filled 2019-05-01: qty 3

## 2019-05-01 MED ORDER — HYDROCODONE-ACETAMINOPHEN 5-325 MG PO TABS
1.0000 | ORAL_TABLET | Freq: Once | ORAL | Status: AC
  Administered 2019-05-01: 1 via ORAL
  Filled 2019-05-01 (×2): qty 1

## 2019-05-01 MED ORDER — ONDANSETRON HCL 4 MG/2ML IJ SOLN: 4.0000 mg | Freq: Four times a day (QID) | INTRAMUSCULAR | Status: DC | PRN

## 2019-05-01 MED ORDER — ALBUTEROL SULFATE HFA 108 (90 BASE) MCG/ACT IN AERS
6.0000 | INHALATION_SPRAY | Freq: Once | RESPIRATORY_TRACT | Status: AC
  Administered 2019-05-01: 6 via RESPIRATORY_TRACT

## 2019-05-01 MED ORDER — ENOXAPARIN SODIUM 40 MG/0.4ML ~~LOC~~ SOLN
40.0000 mg | SUBCUTANEOUS | Status: DC
  Administered 2019-05-01: 40 mg via SUBCUTANEOUS
  Filled 2019-05-01 (×2): qty 0.4

## 2019-05-01 MED ORDER — LISINOPRIL 20 MG PO TABS
20.0000 mg | ORAL_TABLET | Freq: Every day | ORAL | Status: DC
  Administered 2019-05-02: 09:00:00 20 mg via ORAL
  Filled 2019-05-01 (×2): qty 1

## 2019-05-01 MED ORDER — TOCILIZUMAB 400 MG/20ML IV SOLN
8.0000 mg/kg | Freq: Once | INTRAVENOUS | Status: DC
  Filled 2019-05-01: qty 39.9

## 2019-05-01 MED ORDER — ALBUTEROL SULFATE HFA 108 (90 BASE) MCG/ACT IN AERS: 2.0000 | INHALATION_SPRAY | Freq: Four times a day (QID) | RESPIRATORY_TRACT | Status: DC

## 2019-05-01 MED ORDER — HYDROCHLOROTHIAZIDE 12.5 MG PO CAPS
12.5000 mg | ORAL_CAPSULE | Freq: Every day | ORAL | Status: DC
  Administered 2019-05-02: 12.5 mg via ORAL
  Filled 2019-05-01 (×3): qty 1

## 2019-05-01 MED ORDER — FERROUS SULFATE 325 (65 FE) MG PO TABS
325.0000 mg | ORAL_TABLET | Freq: Every day | ORAL | Status: DC
  Administered 2019-05-02: 325 mg via ORAL
  Filled 2019-05-01: qty 1

## 2019-05-01 MED ORDER — ONDANSETRON HCL 4 MG PO TABS: 4.0000 mg | ORAL_TABLET | Freq: Four times a day (QID) | ORAL | Status: DC | PRN

## 2019-05-01 MED ORDER — LISINOPRIL-HYDROCHLOROTHIAZIDE 20-12.5 MG PO TABS: 1.0000 | ORAL_TABLET | Freq: Every day | ORAL | Status: DC

## 2019-05-01 MED ORDER — ACETAMINOPHEN 325 MG PO TABS
650.0000 mg | ORAL_TABLET | Freq: Four times a day (QID) | ORAL | Status: DC | PRN
  Administered 2019-05-01: 650 mg via ORAL
  Filled 2019-05-01: qty 2

## 2019-05-01 NOTE — Progress Notes (Signed)
[place] TEAM 1 - Stepdown/ICU TEAM  Lilyan GilfordBilly Jason Velazco  ZOX:[id number] DOB: 01/18/1976 DOA: 05/01/2019 PCP: Shelva MajesticHunter, Stephen O, MD    Brief Narrative:  43 year old with a history of NSAID related duodenal ulcer, GERD, hepatitis C, HTN, legally blind OD, obstructive sleep apnea, and morbid obesity who presented to the ED with complaints of hypoxia and body aches.  He stated he was diagnosed with COVID-19 2 days prior.  He was monitoring his saturations at home and noted them to drop as low as 80 on room air and therefore returned to the ED.  In the emergency room the patient was found to be saturating at 90% on room air.  With exertion he dipped down to 88% per records but there is no sign that he dropped below this.  Significant Events: 7/16 evaluated in ED -SARS-CoV-2 + [facility]  COVID-19 specific Treatment:   Subjective: Chart reviewed. Pt seen by one of my partners earlier today.   Assessment & Plan:  COVID Pneumonia - Acute hypoxic respiratory failure No fever - not hypoxic (93-99% on RA) - no infiltrate on CXR - not presently a candidate for Remdesivir, or Actemra - monitor   HTN  History of hepatitis C Genotype 1a - F3/F4 fibrosis on hepatic elastography performed 3 years ago - tx w/ Harvoni by Dr. Dulce Sellarutlaw w/ undetectable viral load post tx  Obesity with OSA - Body mass index is 31.57 kg/m.   Chronic low back pain  History of cocaine abuse  Alcoholism  DVT prophylaxis: Lovenox Code Status: FULL CODE Family Communication:  Disposition Plan:   Consultants:  none  Antimicrobials:  none  Objective: Blood pressure 119/89, pulse 82, temperature 97.9 F (36.6 C), temperature source Oral, resp. rate 15, height 5\' 10"  (1.778 m), weight 99.8 kg, SpO2 95 %. No intake or output data in the 24 hours ending 05/01/19 1610 Filed Weights   05/01/19 0653  Weight: 99.8 kg    Examination: No exam - evaluated by my partner earlier today    CBC: Recent Labs  Lab 04/29/19 0931 05/01/19 0732  WBC 4.9 3.8*  NEUTROABS 3.0 1.8  HGB 13.8 14.5  HCT 43.4 44.9  MCV 89.9 89.3  PLT 181 190   Basic Metabolic Panel: Recent Labs  Lab 04/29/19 0931 05/01/19 0732  NA 138 136  K 3.5 3.5  CL 104 103  CO2 24 23  GLUCOSE 94 115*  BUN 14 14  CREATININE 1.12 1.12  CALCIUM 9.0 9.0   GFR: Estimated Creatinine Clearance: 101.7 mL/min (by C-G formula based on SCr of 1.12 mg/dL).  Liver Function Tests: Recent Labs  Lab 04/29/19 0931 05/01/19 0732  AST 17 19  ALT 21 20  ALKPHOS 46 48  BILITOT 0.5 0.5  PROT 7.5 7.8  ALBUMIN 4.1 4.2    HbA1C: Hemoglobin A1C  Date/Time Value Ref Range Status  10/04/2016 04:15 PM 5.3  Final    Comment:    5.3   Hgb A1c MFr Bld  Date/Time Value Ref Range Status  06/18/2018 07:26 PM 5.6 4.8 - 5.6 % Final    Comment:    (NOTE) Pre diabetes:          5.7%-6.4% Diabetes:              >6.4% Glycemic control for   <7.0% adults with diabetes   11/16/2015 01:51 PM 5.4 4.6 - 6.5 % Final    Comment:    Glycemic Control Guidelines  for People with Diabetes:Non Diabetic:  <6%Goal of Therapy: <7%Additional Action Suggested:  >8%      Recent Results (from the past 240 hour(s))  SARS Coronavirus 2 (CEPHEID- Performed in [facility] lab), Hosp Order     Status: Abnormal   Collection Time: 04/29/19  9:35 AM   Specimen: Nasopharyngeal Swab  Result Value Ref Range Status   SARS Coronavirus 2 POSITIVE (A) NEGATIVE Final    Comment: RESULT CALLED TO, READ BACK BY AND VERIFIED WITH: S.WEST RN AT 1245 ON 04/29/2019 BY S.VANHOORNE (NOTE) If result is NEGATIVE SARS-CoV-2 target nucleic acids are NOT DETECTED. The SARS-CoV-2 RNA is generally detectable in upper and lower  respiratory specimens during the acute phase of infection. The lowest  concentration of SARS-CoV-2 viral copies this assay can detect is 250  copies / mL. A negative result does not preclude SARS-CoV-2 infection  and  should not be used as the sole basis for treatment or other  patient management decisions.  A negative result may occur with  improper specimen collection / handling, submission of specimen other  than nasopharyngeal swab, presence of viral mutation(s) within the  areas targeted by this assay, and inadequate number of viral copies  (<250 copies / mL). A negative result must be combined with clinical  observations, patient history, and epidemiological information. If result is POSITIVE SARS-CoV-2 target nucleic acids are DE TECTED. The SARS-CoV-2 RNA is generally detectable in upper and lower  respiratory specimens during the acute phase of infection.  Positive  results are indicative of active infection with SARS-CoV-2.  Clinical  correlation with patient history and other diagnostic information is  necessary to determine patient infection status.  Positive results do  not rule out bacterial infection or co-infection with other viruses. If result is PRESUMPTIVE POSTIVE SARS-CoV-2 nucleic acids MAY BE PRESENT.   A presumptive positive result was obtained on the submitted specimen  and confirmed on repeat testing.  While 2019 novel coronavirus  (SARS-CoV-2) nucleic acids may be present in the submitted sample  additional confirmatory testing may be necessary for epidemiological  and / or clinical management purposes  to differentiate between  SARS-CoV-2 and other Sarbecovirus currently known to infect humans.  If clinically indicated additional testing with an alternate test  methodology (L [id number]) is advised. The SARS-CoV-2 RNA is generally  detectable in upper and lower respiratory specimens during the acute  phase of infection. The expected result is Negative. Fact Sheet for Patients:  BoilerBrush.com.cyhttps://www.fda.gov/media/136312/download Fact Sheet for Healthcare Providers: https://pope.com/https://www.fda.gov/media/136313/download This test is not yet approved or cleared by the [place] FDA and has been  authorized for detection and/or diagnosis of SARS-CoV-2 by FDA under an Emergency Use Authorization (EUA).  This EUA will remain in effect (meaning this test can be used) for the duration of the COVID-19 declaration under Section 564(b)(1) of the Act, 21 U.S.C. section 360bbb-3(b)(1), unless the authorization is terminated or revoked sooner. Performed at [facility], [address] [place], [place] [address]      Scheduled Meds: . albuterol  2 puff Inhalation Q6H  . enoxaparin (LOVENOX) injection  40 mg Subcutaneous Q24H  . [START ON 05/02/2019] ferrous sulfate  325 mg Oral Q breakfast  . lisinopril  20 mg Oral Daily   And  . hydrochlorothiazide  12.5 mg Oral Daily     LOS: 0 days   Lonia BloodJeffrey T. McClung, MD [facility] Office  [phone] Pager - Text Page per Loretha StaplerAmion  If 7PM-7AM, please contact  night-coverage per Amion 05/01/2019, 4:10 PM

## 2019-05-01 NOTE — ED Provider Notes (Signed)
[facility]   CSN: [id number] Arrival date & time: 05/01/19  [id number]     History   Chief Complaint Chief Complaint  Patient presents with  . Shortness of Breath  . Generalized Body Aches    HPI Genevie CheshireBilly Marlowe ShoresJason Wandell is a 43 y.o. male.     Patient complains of shortness of breath.  He has a positive covid test from 2 days ago.  He states that when he gets up and walks around he is very short of breath  The history is provided by the patient. No language interpreter was used.  Shortness of Breath Severity:  Moderate Onset quality:  Sudden Timing:  Constant Progression:  Worsening Chronicity:  New Context: activity   Relieved by:  Nothing Worsened by:  Nothing Ineffective treatments:  None tried Associated symptoms: no abdominal pain, no chest pain, no cough, no headaches and no rash   Risk factors: no recent alcohol use     Past Medical History:  Diagnosis Date  . Acute duodenal ulcer with hemorrhage 10/12/2014  . GERD (gastroesophageal reflux disease)   . GI bleed   . Hepatitis C   . Hypertension   . 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 Legally blind in right eye, as defined in [place]   . Mesenteric adenitis   . Sliding hiatal hernia   . Substance abuse (HCC)    cocaine  . 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 COVID-19 virus infection 05/01/2019  . Low back pain 03/02/2019  . Hyperlipidemia, unspecified 03/02/2019  . Anemia 09/06/2018  . Upper GI bleed 08/31/2018  . Atypical chest pain 06/18/2018  . Mallory-Weiss tear 12/03/2016  . BPPV (benign paroxysmal positional vertigo) 09/12/2016  . Genital herpes 09/12/2016  . OSA (obstructive sleep apnea) 05/28/2016  . AKI (acute kidney injury) (HCC) 04/22/2016  . Obese 11/17/2015  . Cocaine abuse (HCC) 11/16/2015  . Alcoholism (HCC) 11/16/2015  . Gout 11/16/2015  . Legally blind in right eye, as  defined in [place]   . Acute duodenal ulcer with hemorrhage 10/12/2014  . EKG abnormality 10/12/2014  . Hypertension 10/06/2014  . Esophageal reflux 11/24/2013  . Hepatitis C     Past Surgical History:  Procedure Laterality Date  . BIOPSY  09/08/2018   Procedure: BIOPSY;  Surgeon: Bernette RedbirdBuccini, Robert, MD;  Location: [facility] ENDOSCOPY;  Service: Endoscopy;;  . ESOPHAGOGASTRODUODENOSCOPY N/A 10/13/2014   Procedure: ESOPHAGOGASTRODUODENOSCOPY (EGD);  Surgeon: Iva Booparl E Gessner, MD;  [facility];  Service: Endoscopy;  Laterality: N/A;  . ESOPHAGOGASTRODUODENOSCOPY N/A 11/17/2016   Procedure: ESOPHAGOGASTRODUODENOSCOPY (EGD);  Surgeon: Charlott RakesVincent Schooler, MD;  [facility];  Service: Endoscopy;  Laterality: N/A;  . ESOPHAGOGASTRODUODENOSCOPY (EGD) WITH PROPOFOL N/A 09/01/2018   Procedure: ESOPHAGOGASTRODUODENOSCOPY (EGD) WITH PROPOFOL;  Surgeon: Carman ChingEdwards, James, MD;  Location: [facility];  Service: Endoscopy;  Laterality: N/A;  . ESOPHAGOGASTRODUODENOSCOPY (EGD) WITH PROPOFOL N/A 09/08/2018   Procedure: ESOPHAGOGASTRODUODENOSCOPY (EGD) WITH PROPOFOL;  Surgeon: Bernette RedbirdBuccini, Robert, MD;  Location: [facility];  Service: Endoscopy;  Laterality: N/A;  . EYE SURGERY Right   . FOOT SURGERY Right   . LIVER BIOPSY     Dr. Kinnie ScalesMedoff GI        Home Medications    Prior to Admission medications   Medication Sig Start Date End Date Taking? Authorizing Provider  acetaminophen (TYLENOL) 325 MG tablet Take 650 mg by mouth every 6 (six) hours as  needed for mild pain, fever or headache.   Yes [provider]  ferrous sulfate 325 (65 FE) MG tablet Take 325 mg by mouth daily with breakfast.   Yes [provider]  lisinopril-hydrochlorothiazide (ZESTORETIC) 20-12.5 MG tablet Take 1 tablet by mouth daily. 02/19/19  Yes Marin Olp, MD  valACYclovir (VALTREX) 500 MG tablet Take 1 tablet (500 mg total) by mouth 2 (two) times daily. For 3 days with outbreaks Patient taking differently: Take  500 mg by mouth 2 (two) times daily as needed (Outbreaks). For 3 days 02/19/19  Yes Marin Olp, MD    Family History Family History  Problem Relation Age of Onset  . Rheum arthritis Mother   . Hypertension Mother   . Alcoholism Mother   . Heart failure Father        rheumatic heart disease, also drinking and smoking  . Alcoholism Father        died age 66  . Colon cancer Neg Hx     Social History Social History   Tobacco Use  . Smoking status: Never Smoker  . Smokeless tobacco: Never Used  Substance Use Topics  . Alcohol use: No    Comment: etoh free since 08/2015.  . Drug use: No    Comment: used cocaine before but has been clean since 08/2015.      Allergies   Patient has no known allergies.   Review of Systems Review of Systems  Constitutional: Negative for appetite change and fatigue.  HENT: Negative for congestion, ear discharge and sinus pressure.   Eyes: Negative for discharge.  Respiratory: Positive for shortness of breath. Negative for cough.   Cardiovascular: Negative for chest pain.  Gastrointestinal: Negative for abdominal pain and diarrhea.  Genitourinary: Negative for frequency and hematuria.  Musculoskeletal: Negative for back pain.  Skin: Negative for rash.  Neurological: Negative for seizures and headaches.  Psychiatric/Behavioral: Negative for hallucinations.  Patient complaint   Physical Exam Updated Vital Signs BP 111/81   Pulse 79   Temp 97.9 F (36.6 C) (Oral)   Resp (!) 22   Ht 5\' 10"  (1.778 m)   Wt 99.8 kg   SpO2 92%   BMI 31.57 kg/m   Physical Exam Vitals signs and nursing note reviewed.  Constitutional:      Appearance: He is well-developed.  HENT:     Head: Normocephalic.     Nose: Nose normal.  Eyes:     General: No scleral icterus.    Conjunctiva/sclera: Conjunctivae normal.  Neck:     Musculoskeletal: Neck supple.     Thyroid: No thyromegaly.  Cardiovascular:     Rate and Rhythm: Normal rate and regular  rhythm.     Heart sounds: No murmur. No friction rub. No gallop.   Pulmonary:     Breath sounds: No stridor. No wheezing or rales.  Chest:     Chest wall: No tenderness.  Abdominal:     General: There is no distension.     Tenderness: There is no abdominal tenderness. There is no rebound.  Musculoskeletal: Normal range of motion.  Lymphadenopathy:     Cervical: No cervical adenopathy.  Skin:    Findings: No erythema or rash.  Neurological:     Mental Status: He is oriented to person, place, and time.     Motor: No abnormal muscle tone.     Coordination: Coordination normal.  Psychiatric:        Behavior: Behavior normal.  ED Treatments / Results  Labs (all labs ordered are listed, but only abnormal results are displayed) Labs Reviewed  CBC WITH DIFFERENTIAL/PLATELET - Abnormal; Notable for the following components:      Result Value   WBC 3.8 (*)    RDW 15.7 (*)    All other components within normal limits  COMPREHENSIVE METABOLIC PANEL - Abnormal; Notable for the following components:   Glucose, Bld 115 (*)    All other components within normal limits    EKG None  Radiology Dg Chest Port 1 View  Result Date: 05/01/2019 CLINICAL DATA:  Positive COVID-19 test on July 16th. Increased shortness of breath and body aches today. EXAM: PORTABLE CHEST 1 VIEW COMPARISON:  Chest x-rays dated 04/29/2019 and 09/06/2018. FINDINGS: The heart size and mediastinal contours are within normal limits. Both lungs are clear. The visualized skeletal structures are unremarkable. IMPRESSION: No active disease.  No evidence of pneumonia. Electronically Signed   By: Bary RichardStan  Maynard M.D.   On: 05/01/2019 09:38    Procedures Procedures (including critical care time)  Medications Ordered in ED Medications  albuterol (VENTOLIN HFA) 108 (90 Base) MCG/ACT inhaler 2 puff (2 puffs Inhalation Given 05/01/19 0847)  predniSONE (DELTASONE) tablet 60 mg (60 mg Oral Given 05/01/19 0833)   HYDROcodone-acetaminophen (NORCO/VICODIN) 5-325 MG per tablet 1 tablet (1 tablet Oral Given 05/01/19 0845)  albuterol (VENTOLIN HFA) 108 (90 Base) MCG/ACT inhaler 6 puff (6 puffs Inhalation Given 05/01/19 1021)     Initial Impression / Assessment and Plan / ED Course  I have reviewed the triage vital signs and the nursing notes.  Pertinent labs & imaging results that were available during my care of the patient were reviewed by me and considered in my medical decision making (see chart for details).    Genevie CheshireBilly Marlowe ShoresJason Tanimoto was evaluated in Emergency Department on 05/01/2019 for the symptoms described in the history of present illness. He was evaluated in the context of the global COVID-19 pandemic, which necessitated consideration that the patient might be at risk for infection with the SARS-CoV-2 virus that causes COVID-19. Institutional protocols and algorithms that pertain to the evaluation of patients at risk for COVID-19 are in a state of rapid change based on information released by regulatory bodies including the CDC and federal and state organizations. These policies and algorithms were followed during the patient's care in the ED.     CRITICAL CARE Performed by: Bethann BerkshireJoseph Alexanderia Gorby Total critical care time: 40 minutes Critical care time was exclusive of separately billable procedures and treating other patients. Critical care was necessary to treat or prevent imminent or life-threatening deterioration. Critical care was time spent personally by me on the following activities: development of treatment plan with patient and/or surrogate as well as nursing, discussions with consultants, evaluation of patient's response to treatment, examination of patient, obtaining history from patient or surrogate, ordering and performing treatments and interventions, ordering and review of laboratory studies, ordering and review of radiographic studies, pulse oximetry and re-evaluation of patient's condition.   Patient became hypoxic on ambulation.  His sats dropped down into the 80s.  He also had sats in the 80s when he was lying on his left side.  Patient will be admitted to medicine Final Clinical Impressions(s) / ED Diagnoses   Final diagnoses:  Hypoxia    ED Discharge Orders    None       Bethann BerkshireZammit, Paticia Moster, MD 05/01/19 1233

## 2019-05-01 NOTE — ED Notes (Signed)
Ambulated in room multiple times, sats decreased to 89%, noted also when sleeping he was at 88%, Resp remain about 22-24 ambulating, placed on 2 L [place] sats in mid to upper 90's.

## 2019-05-01 NOTE — ED Notes (Signed)
ED TO INPATIENT HANDOFF REPORT  ED Nurse Name and Phone #: [phone] [place] Name/Age/Gender Gabriel Garcia 43 y.o. male Room/Bed: WA18/WA18  Code Status   Code Status: Prior  Home/SNF/Other Home Patient oriented to: self, place, time and situation Is this baseline? Yes   Triage Complete: Triage complete  Chief Complaint covid positive - resp distress  Triage Note Pt reports positive COVID test on 7/16. He reports increased SOB today. He bought a pulse ox and noted that his pulse ox was mid 80s when he woke up this morning.    Allergies No Known Allergies  Level of Care/Admitting Diagnosis ED Disposition    ED Disposition Condition Comment   Admit  Hospital Area: [facility] [[id number]]  Level of Care: Med-Surg [16]  Covid Evaluation: Confirmed COVID Positive  Diagnosis: COVID-19 virus infection [[id number]]  Admitting Physician: Darliss Cheney [[id number]]  Attending Physician: Darliss Cheney [[id number]]  PT Class (Do Not Modify): Observation [104]  PT Acc Code (Do Not Modify): Observation [10022]       B Medical/Surgery History Past Medical History:  Diagnosis Date  . Acute duodenal ulcer with hemorrhage 10/12/2014  . GERD (gastroesophageal reflux disease)   . GI bleed   . Hepatitis C   . Hypertension   . 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 Legally blind in right eye, as defined in [place]   . Mesenteric adenitis   . Sliding hiatal hernia   . Substance abuse ([place])    cocaine  . Terminal ileitis [facility])    Past Surgical History:  Procedure Laterality Date  . BIOPSY  09/08/2018   Procedure: BIOPSY;  Surgeon: Ronald Lobo, MD;  Location: [facility] ENDOSCOPY;  Service: Endoscopy;;  . ESOPHAGOGASTRODUODENOSCOPY N/A 10/13/2014   Procedure: ESOPHAGOGASTRODUODENOSCOPY (EGD);  Surgeon: Gatha Mayer, MD;  Location[facility];  Service: Endoscopy;  Laterality: N/A;  . ESOPHAGOGASTRODUODENOSCOPY N/A 11/17/2016   Procedure:  ESOPHAGOGASTRODUODENOSCOPY (EGD);  Surgeon: Wilford Corner, MD;  Location: [facility] ENDOSCOPY;  Service: Endoscopy;  Laterality: N/A;  . ESOPHAGOGASTRODUODENOSCOPY (EGD) WITH PROPOFOL N/A 09/01/2018   Procedure: ESOPHAGOGASTRODUODENOSCOPY (EGD) WITH PROPOFOL;  Surgeon: Laurence Spates, MD;  [facility] ENDOSCOPY;  Service: Endoscopy;  Laterality: N/A;  . ESOPHAGOGASTRODUODENOSCOPY (EGD) WITH PROPOFOL N/A 09/08/2018   Procedure: ESOPHAGOGASTRODUODENOSCOPY (EGD) WITH PROPOFOL;  Surgeon: Ronald Lobo, MD;  [facility]OSCOPY;  Service: Endoscopy;  Laterality: N/A;  . EYE SURGERY Right   . FOOT SURGERY Right   . LIVER BIOPSY     Dr. Earlean Shawl GI     A IV Location/Drains/Wounds Patient Lines/Drains/Airways Status   Active Line/Drains/Airways    Name:   Placement date:   Placement time:   Site:   Days:   Peripheral IV 05/01/19 Left Forearm   05/01/19    0704    Forearm   less than 1   Peripheral IV 05/01/19 Left Forearm   05/01/19    0704    Forearm   less than 1          Intake/Output Last 24 hours No intake or output data in the 24 hours ending 05/01/19 1311  Labs/Imaging Results for orders placed or performed during the hospital encounter of 05/01/19 (from the past 48 hour(s))  CBC with Differential/Platelet     Status: Abnormal   Collection Time: 05/01/19  7:32 AM  Result Value Ref Range   WBC 3.8 (L) 4.0 - 10.5 K/uL   RBC 5.03 4.22 - 5.81 MIL/uL  Hemoglobin 14.5 13.0 - 17.0 g/dL   HCT 96.044.9 45.439.0 - 09.852.0 %   MCV 89.3 80.0 - 100.0 fL   MCH 28.8 26.0 - 34.0 pg   MCHC 32.3 30.0 - 36.0 g/dL   RDW 11.915.7 (H) 14.711.5 - 82.915.5 %   Platelets 190 150 - 400 K/uL   nRBC 0.0 0.0 - 0.2 %   Neutrophils Relative % 48 %   Neutro Abs 1.8 1.7 - 7.7 K/uL   Lymphocytes Relative 40 %   Lymphs Abs 1.5 0.7 - 4.0 K/uL   Monocytes Relative 9 %   Monocytes Absolute 0.3 0.1 - 1.0 K/uL   Eosinophils Relative 2 %   Eosinophils Absolute 0.1 0.0 - 0.5 K/uL   Basophils Relative 1 %   Basophils Absolute 0.0  0.0 - 0.1 K/uL   Immature Granulocytes 0 %   Abs Immature Granulocytes 0.01 0.00 - 0.07 K/uL    Comment: Performed at [facility], [address] [place], [place] [address]  Comprehensive metabolic panel     Status: Abnormal   Collection Time: 05/01/19  7:32 AM  Result Value Ref Range   Sodium 136 135 - 145 mmol/L   Potassium 3.5 3.5 - 5.1 mmol/L   Chloride 103 98 - 111 mmol/L   CO2 23 22 - 32 mmol/L   Glucose, Bld 115 (H) 70 - 99 mg/dL   BUN 14 6 - 20 mg/dL   Creatinine, Ser 0.861.12 0.61 - 1.24 mg/dL   Calcium 9.0 8.9 - 57.810.3 mg/dL   Total Protein 7.8 6.5 - 8.1 g/dL   Albumin 4.2 3.5 - 5.0 g/dL   AST 19 15 - 41 U/L   ALT 20 0 - 44 U/L   Alkaline Phosphatase 48 38 - 126 U/L   Total Bilirubin 0.5 0.3 - 1.2 mg/dL   GFR calc non Af Amer >60 >60 mL/min   GFR calc Af Amer >60 >60 mL/min   Anion gap 10 5 - 15    Comment: Performed at [facility], [address] [place], [place] [address]   Dg Chest Port 1 View  Result Date: 05/01/2019 CLINICAL DATA:  Positive COVID-19 test on July 16th. Increased shortness of breath and body aches today. EXAM: PORTABLE CHEST 1 VIEW COMPARISON:  Chest x-rays dated 04/29/2019 and 09/06/2018. FINDINGS: The heart size and mediastinal contours are within normal limits. Both lungs are clear. The visualized skeletal structures are unremarkable. IMPRESSION: No active disease.  No evidence of pneumonia. Electronically Signed   By: Bary RichardStan  Maynard M.D.   On: 05/01/2019 09:38    Pending Labs [employer] Labs (From admission, onward)    Start     Ordered   Signed and Held  ABO/Rh  Once,   R     Signed and Held   Signed and Held  CBC  (enoxaparin (LOVENOX)    CrCl >/= 30 ml/min)  Once,   R    Comments: Baseline for enoxaparin therapy IF NOT ALREADY DRAWN.  Notify MD if PLT < 100 K.    Signed and Held   Signed and Held  Creatinine, serum  (enoxaparin (LOVENOX)    CrCl >/= 30 ml/min)  Once,   R    Comments: Baseline for  enoxaparin therapy IF NOT ALREADY DRAWN.    Signed and Held   Signed and Held  Creatinine, serum  (enoxaparin (LOVENOX)    CrCl >/= 30 ml/min)  Weekly,   R    Comments: while on  enoxaparin therapy    Signed and Held   Signed and Held  Influenza panel by PCR (type A & B)  Add-on,   R     Signed and Held   Signed and Held  Respiratory Panel by PCR  Add-on,   R     Signed and Held   Signed and Held  Procalcitonin  Once,   R     Signed and Held   Signed and Held  Lactate dehydrogenase  Once,   R     Signed and Held   Signed and Held  Sedimentation rate  Once,   R     Signed and Held   Signed and Held  Ferritin  Once,   R     Signed and Held   Signed and Held  D-dimer, quantitative (not at [facility])  Once,   R     Signed and Held   Signed and Held  Magnesium  Daily,   R     Signed and Held   Signed and Held  Comprehensive metabolic panel  Daily,   R     Signed and Held   Signed and Held  CBC with Differential/Platelet  Daily,   R     Signed and Held          Vitals/Pain Today's Vitals   05/01/19 1103 05/01/19 1130 05/01/19 1200 05/01/19 1300  BP: 127/79 122/84 111/81 129/76  Pulse: 74 95 79   Resp: 18 (!) 23 (!) 22 14  Temp:      TempSrc:      SpO2: 94% 93% 92%   Weight:      Height:      PainSc:        Isolation Precautions No active isolations  Medications Medications  albuterol (VENTOLIN HFA) 108 (90 Base) MCG/ACT inhaler 2 puff (2 puffs Inhalation Given 05/01/19 0847)  predniSONE (DELTASONE) tablet 60 mg (60 mg Oral Given 05/01/19 0833)  HYDROcodone-acetaminophen (NORCO/VICODIN) 5-325 MG per tablet 1 tablet (1 tablet Oral Given 05/01/19 0845)  albuterol (VENTOLIN HFA) 108 (90 Base) MCG/ACT inhaler 6 puff (6 puffs Inhalation Given 05/01/19 1021)    Mobility walks Low fall risk   Focused Assessments   R Recommendations: See Admitting Provider Note  Report given to:   Additional Notes:

## 2019-05-01 NOTE — ED Notes (Signed)
Patient is complaining of body aches all over. Patient states he has a pulse ox at home and it was reading in the 80's. Patient states that when he lays down it is hard to breathe.

## 2019-05-01 NOTE — ED Triage Notes (Addendum)
Pt reports positive COVID test on 7/16. He reports increased SOB today. He bought a pulse ox and noted that his pulse ox was mid 80s when he woke up this morning.

## 2019-05-01 NOTE — H&P (Signed)
History and Physical    Gabriel Garcia CEY:[id number] DOB: 28-May-1976 DOA: 05/01/2019  PCP: Marin Olp, MD  Patient coming from: Home  I have personally briefly reviewed patient's old medical records in [place]  Chief Complaint: Hypoxia/body aches  HPI: Gabriel Garcia is a 43 y.o. male with medical history significant of duodenal ulcer, GERD, hepatitis C, hypertension, legally blind in right eye, obstructive sleep apnea and morbid obesity presented to ER with a complaint of hypoxia and body aches.  According to patient, he was diagnosed with COVID-19 positive almost 2 days ago.  He has been having body aches since then however today his oxygen saturation was in 80s on room air so he came to the emergency department.  He also has been having night sweats and intermittent chills along with some exertional shortness of breath.  No other complaint.  ED Course: Upon initial presentation to ER, he was hemodynamically stable and was saturating about 90% on room air.  He was given a dose of albuterol inhaler and later on he was walked in the room and his oxygen saturation fell below 88%.  About an hour after, he was given another dose of Advair inhaler and was walked once again and once again he dropped his oxygen saturation less than 88% on room air so hospital service was called to admit the patient.  CBC was unremarkable and so was CMP.  Chest x-ray was unremarkable as well.  Review of Systems: As per HPI otherwise 10 point review of systems negative.    Past Medical History:  Diagnosis Date  . Acute duodenal ulcer with hemorrhage 10/12/2014  . GERD (gastroesophageal reflux disease)   . GI bleed   . Hepatitis C   . Hypertension   . 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 Legally blind in right eye, as defined in [place]   . Mesenteric adenitis   . Sliding hiatal hernia   . Substance abuse ([place])    cocaine  . Terminal ileitis [facility])     Past  Surgical History:  Procedure Laterality Date  . BIOPSY  09/08/2018   Procedure: BIOPSY;  Surgeon: Ronald Lobo, MD;  [facility];  Service: Endoscopy;;  . ESOPHAGOGASTRODUODENOSCOPY N/A 10/13/2014   Procedure: ESOPHAGOGASTRODUODENOSCOPY (EGD);  Surgeon: Gatha Mayer, MD;  [facility];  Service: Endoscopy;  Laterality: N/A;  . ESOPHAGOGASTRODUODENOSCOPY N/A 11/17/2016   Procedure: ESOPHAGOGASTRODUODENOSCOPY (EGD);  Surgeon: Wilford Corner, MD;  [facility]: [facility] ENDOSCOPY;  Service: Endoscopy;  Laterality: N/A;  . ESOPHAGOGASTRODUODENOSCOPY (EGD) WITH PROPOFOL N/A 09/01/2018   Procedure: ESOPHAGOGASTRODUODENOSCOPY (EGD) WITH PROPOFOL;  Surgeon: Laurence Spates, MD;  [facility];  Service: Endoscopy;  Laterality: N/A;  . ESOPHAGOGASTRODUODENOSCOPY (EGD) WITH PROPOFOL N/A 09/08/2018   Procedure: ESOPHAGOGASTRODUODENOSCOPY (EGD) WITH PROPOFOL;  Surgeon: Ronald Lobo, MD;  [facility]OSCOPY;  Service: Endoscopy;  Laterality: N/A;  . EYE SURGERY Right   . FOOT SURGERY Right   . LIVER BIOPSY     Dr. Earlean Shawl GI     reports that he has never smoked. He has never used smokeless tobacco. He reports that he does not drink alcohol or use drugs.  No Known Allergies  Family History  Problem Relation Age of Onset  . Rheum arthritis Mother   . Hypertension Mother   . Alcoholism Mother   . Heart failure Father        rheumatic heart disease, also drinking and smoking  . Alcoholism Father  died age 34  . Colon cancer Neg Hx     Prior to Admission medications   Medication Sig Start Date End Date Taking? Authorizing Provider  acetaminophen (TYLENOL) 325 MG tablet Take 650 mg by mouth every 6 (six) hours as needed for mild pain, fever or headache.   Yes [provider]  ferrous sulfate 325 (65 FE) MG tablet Take 325 mg by mouth daily with breakfast.   Yes [provider]  lisinopril-hydrochlorothiazide (ZESTORETIC) 20-12.5 MG tablet Take  1 tablet by mouth daily. 02/19/19  Yes Marin Olp, MD  valACYclovir (VALTREX) 500 MG tablet Take 1 tablet (500 mg total) by mouth 2 (two) times daily. For 3 days with outbreaks Patient taking differently: Take 500 mg by mouth 2 (two) times daily as needed (Outbreaks). For 3 days 02/19/19  Yes Marin Olp, MD    Physical Exam: Vitals:   05/01/19 1200 05/01/19 1300 05/01/19 1400 05/01/19 1419  BP: 111/81 129/76 116/81 116/81  Pulse: 79  74 78  Resp: (!) '22 14 19 15  '$ Temp:      TempSrc:      SpO2: 92%  91% 94%  Weight:      Height:        Constitutional: NAD, calm, comfortable Vitals:   05/01/19 1200 05/01/19 1300 05/01/19 1400 05/01/19 1419  BP: 111/81 129/76 116/81 116/81  Pulse: 79  74 78  Resp: (!) '22 14 19 15  '$ Temp:      TempSrc:      SpO2: 92%  91% 94%  Weight:      Height:       Eyes: PERRL, lids and conjunctivae normal, morbidly obese ENMT: Mucous membranes are moist. Posterior pharynx clear of any exudate or lesions.Normal dentition.  Neck: normal, supple, no masses, no thyromegaly Respiratory: clear to auscultation bilaterally, no wheezing, no crackles. Normal respiratory effort. No accessory muscle use.  Cardiovascular: Regular rate and rhythm, no murmurs / rubs / gallops. No extremity edema. 2+ pedal pulses. No carotid bruits.  Abdomen: no tenderness, no masses palpated. No hepatosplenomegaly. Bowel sounds positive.  Musculoskeletal: no clubbing / cyanosis. No joint deformity upper and lower extremities. Good ROM, no contractures. Normal muscle tone.  Skin: no rashes, lesions, ulcers. No induration Neurologic: CN 2-12 grossly intact. Sensation intact, DTR normal. Strength 5/5 in all 4.  Psychiatric: Normal judgment and insight. Alert and oriented x 3. Normal mood.    Labs on Admission: I have personally reviewed following labs and imaging studies  CBC: Recent Labs  Lab 04/29/19 0931 05/01/19 0732  WBC 4.9 3.8*  NEUTROABS 3.0 1.8  HGB 13.8 14.5   HCT 43.4 44.9  MCV 89.9 89.3  PLT 181 644   Basic Metabolic Panel: Recent Labs  Lab 04/29/19 0931 05/01/19 0732  NA 138 136  K 3.5 3.5  CL 104 103  CO2 24 23  GLUCOSE 94 115*  BUN 14 14  CREATININE 1.12 1.12  CALCIUM 9.0 9.0   GFR: Estimated Creatinine Clearance: 101.7 mL/min (by C-G formula based on SCr of 1.12 mg/dL). Liver Function Tests: Recent Labs  Lab 04/29/19 0931 05/01/19 0732  AST 17 19  ALT 21 20  ALKPHOS 46 48  BILITOT 0.5 0.5  PROT 7.5 7.8  ALBUMIN 4.1 4.2   No results for input(s): LIPASE, AMYLASE in the last 168 hours. No results for input(s): AMMONIA in the last 168 hours. Coagulation Profile: No results for input(s): INR, PROTIME in the last 168 hours. Cardiac Enzymes:  No results for input(s): CKTOTAL, CKMB, CKMBINDEX, TROPONINI in the last 168 hours. BNP (last 3 results) No results for input(s): PROBNP in the last 8760 hours. HbA1C: No results for input(s): HGBA1C in the last 72 hours. CBG: No results for input(s): GLUCAP in the last 168 hours. Lipid Profile: No results for input(s): CHOL, HDL, LDLCALC, TRIG, CHOLHDL, LDLDIRECT in the last 72 hours. Thyroid Function Tests: No results for input(s): TSH, T4TOTAL, FREET4, T3FREE, THYROIDAB in the last 72 hours. Anemia Panel: No results for input(s): VITAMINB12, FOLATE, FERRITIN, TIBC, IRON, RETICCTPCT in the last 72 hours. Urine analysis:    Component Value Date/Time   COLORURINE YELLOW 06/18/2018 [place] 06/18/2018 1719   LABSPEC 1.012 06/18/2018 1719   PHURINE 5.0 06/18/2018 1719   GLUCOSEU NEGATIVE 06/18/2018 1719   HGBUR NEGATIVE 06/18/2018 1719   BILIRUBINUR NEGATIVE 06/18/2018 1719   BILIRUBINUR n 11/16/2015 1411   KETONESUR NEGATIVE 06/18/2018 1719   PROTEINUR NEGATIVE 06/18/2018 1719   UROBILINOGEN 1.0 11/16/2015 1411   UROBILINOGEN 0.2 07/06/2014 1630   NITRITE NEGATIVE 06/18/2018 1719   LEUKOCYTESUR TRACE (A) 06/18/2018 1719    Radiological Exams on  Admission: Dg Chest Port 1 View  Result Date: 05/01/2019 CLINICAL DATA:  Positive COVID-19 test on July 16th. Increased shortness of breath and body aches today. EXAM: PORTABLE CHEST 1 VIEW COMPARISON:  Chest x-rays dated 04/29/2019 and 09/06/2018. FINDINGS: The heart size and mediastinal contours are within normal limits. Both lungs are clear. The visualized skeletal structures are unremarkable. IMPRESSION: No active disease.  No evidence of pneumonia. Electronically Signed   By: Franki Cabot M.D.   On: 05/01/2019 09:38    Assessment/Plan Active Problems:   Hepatitis C   Hypertension   Legally blind in right eye, as defined in [place]   Obese   OSA (obstructive sleep apnea)   COVID-19 virus infection   Acute respiratory failure with hypoxia ([place])    Acute hypoxic respiratory failure secondary to COVID-19 infection: We will admit patient to [facility].  Patient agreeable.  He meets criteria for receiving Actemra which I have ordered.  Has normal LFTs.  He does not meet criteria for Remdesvir due to having normal chest x-ray.  We will continue rest of the symptomatic management with albuterol inhaler every 6 hours as needed.  He has already received 60 mg of prednisone in the ER.  Continue oxygen and taper as able to.  Will order ferritin, d-dimer, LDH, ESR and CRP.  Hypertension: Blood patient controlled.  Resume home antihypertensives.  DVT prophylaxis: Lovenox Code Status: Full code Family Communication: None present at bedside. Disposition Plan: Likely discharge in next 1 to 2 days. Consults called: None Admission status: Admitted as observation at [facility] MD [facility] Pager [phone]  If 7PM-7AM, please contact night-coverage [email or web address] Password [facility]  05/01/2019, 2:33 PM

## 2019-05-01 NOTE — ED Notes (Signed)
Ambulated in room, 02 sat as low as 92% on RA, Resp Increased 24 has increased pain in hips and back upon walking,

## 2019-05-02 DIAGNOSIS — G4733 Obstructive sleep apnea (adult) (pediatric): Secondary | ICD-10-CM | POA: Diagnosis not present

## 2019-05-02 DIAGNOSIS — B1710 Acute hepatitis C without hepatic coma: Secondary | ICD-10-CM | POA: Diagnosis not present

## 2019-05-02 DIAGNOSIS — U071 COVID-19: Secondary | ICD-10-CM | POA: Diagnosis not present

## 2019-05-02 LAB — CBC WITH DIFFERENTIAL/PLATELET
Abs Immature Granulocytes: 0 10*3/uL (ref 0.00–0.07)
Basophils Absolute: 0 10*3/uL (ref 0.0–0.1)
Basophils Relative: 0 %
Eosinophils Absolute: 0 10*3/uL (ref 0.0–0.5)
Eosinophils Relative: 0 %
HCT: 45.4 % (ref 39.0–52.0)
Hemoglobin: 14.1 g/dL (ref 13.0–17.0)
Immature Granulocytes: 0 %
Lymphocytes Relative: 28 %
Lymphs Abs: 1.3 10*3/uL (ref 0.7–4.0)
MCH: 27.6 pg (ref 26.0–34.0)
MCHC: 31.1 g/dL (ref 30.0–36.0)
MCV: 88.8 fL (ref 80.0–100.0)
Monocytes Absolute: 0.4 10*3/uL (ref 0.1–1.0)
Monocytes Relative: 9 %
Neutro Abs: 3 10*3/uL (ref 1.7–7.7)
Neutrophils Relative %: 63 %
Platelets: 222 10*3/uL (ref 150–400)
RBC: 5.11 MIL/uL (ref 4.22–5.81)
RDW: 15.4 % (ref 11.5–15.5)
WBC: 4.7 10*3/uL (ref 4.0–10.5)
nRBC: 0 % (ref 0.0–0.2)

## 2019-05-02 LAB — COMPREHENSIVE METABOLIC PANEL
ALT: 20 U/L (ref 0–44)
AST: 15 U/L (ref 15–41)
Albumin: 4 g/dL (ref 3.5–5.0)
Alkaline Phosphatase: 46 U/L (ref 38–126)
Anion gap: 9 (ref 5–15)
BUN: 14 mg/dL (ref 6–20)
CO2: 28 mmol/L (ref 22–32)
Calcium: 9.2 mg/dL (ref 8.9–10.3)
Chloride: 102 mmol/L (ref 98–111)
Creatinine, Ser: 1.01 mg/dL (ref 0.61–1.24)
GFR calc Af Amer: >60 mL/min (ref >60)
GFR calc non Af Amer: >60 mL/min (ref >60)
Glucose, Bld: 115 mg/dL — ABNORMAL HIGH (ref 70–99)
Potassium: 3.6 mmol/L (ref 3.5–5.1)
Sodium: 139 mmol/L (ref 135–145)
Total Bilirubin: 0.3 mg/dL (ref 0.3–1.2)
Total Protein: 7.5 g/dL (ref 6.5–8.1)

## 2019-05-02 LAB — D-DIMER, QUANTITATIVE: D-Dimer, Quant: <0.27 ug/mL-FEU (ref 0.00–0.50)

## 2019-05-02 LAB — MAGNESIUM: Magnesium: 2.2 mg/dL (ref 1.7–2.4)

## 2019-05-02 LAB — FERRITIN: Ferritin: 23 ng/mL — ABNORMAL LOW (ref 24–336)

## 2019-05-02 LAB — C-REACTIVE PROTEIN: CRP: <0.8 mg/dL (ref <1.0)

## 2019-05-02 MED ORDER — TRAMADOL HCL 50 MG PO TABS: 50.0000 mg | ORAL_TABLET | Freq: Four times a day (QID) | ORAL | Status: DC | PRN

## 2019-05-02 NOTE — Progress Notes (Addendum)
1454  Discharge instructions reviewed with patient.  All questions answered.  IV x 2 removed intact.  Awaiting a ride home.    1520  Transported to exit and released to family.

## 2019-05-02 NOTE — Progress Notes (Addendum)
SATURATION QUALIFICATIONS: (This note is used to comply with regulatory documentation for home oxygen)  Patient Saturations on Room Air at Rest = 98%  Patient Saturations on Room Air while Ambulating = 95%  Minimal shortness of breath and no dizziness.

## 2019-05-02 NOTE — Plan of Care (Signed)
  Problem: Education: Goal: Knowledge of risk factors and measures for prevention of condition will improve 05/02/2019 1543 by Phyllis Ginger, RN Outcome: Adequate for Discharge 05/02/2019 1138 by Phyllis Ginger, RN Outcome: Progressing   Problem: Coping: Goal: Psychosocial and spiritual needs will be supported 05/02/2019 1543 by Phyllis Ginger, RN Outcome: Adequate for Discharge 05/02/2019 1138 by Phyllis Ginger, RN Outcome: Progressing   Problem: Respiratory: Goal: Will maintain a patent airway 05/02/2019 1543 by Phyllis Ginger, RN Outcome: Adequate for Discharge 05/02/2019 1138 by Phyllis Ginger, RN Outcome: Progressing Goal: Complications related to the disease process, condition or treatment will be avoided or minimized 05/02/2019 1543 by Phyllis Ginger, RN Outcome: Adequate for Discharge 05/02/2019 1138 by Phyllis Ginger, RN Outcome: Progressing

## 2019-05-02 NOTE — Discharge Instructions (Signed)
COVID-19 Frequently Asked Questions COVID-19 (coronavirus disease) is an infection that is caused by a large family of viruses. Some viruses cause illness in people and others cause illness in animals like camels, cats, and bats. In some cases, the viruses that cause illness in animals can spread to humans. Where did the coronavirus come from? In December 2019, [place] told the [facility]) of several cases of lung disease (human respiratory illness). These cases were linked to an open seafood and livestock market in the city of [place]. The link to the seafood and livestock market suggests that the virus may have spread from animals to humans. However, since that first outbreak in December, the virus has also been shown to spread from person to person. What is the name of the disease and the virus? Disease name Early on, this disease was called novel coronavirus. This is because scientists determined that the disease was caused by a new (novel) respiratory virus. The World Health Organization [facility]) has now named the disease COVID-19, or coronavirus disease. Virus name The virus that causes the disease is called severe acute respiratory syndrome coronavirus 2 (SARS-CoV-2). More information on disease and virus naming World Health Organization [facility]): www.who.int/emergencies/diseases/novel-coronavirus-2019/technical-guidance/naming-the-coronavirus-disease-(covid-2019)-and-the-virus-that-causes-it Who is at risk for complications from coronavirus disease? Some people may be at higher risk for complications from coronavirus disease. This includes older adults and people who have chronic diseases, such as heart disease, diabetes, and lung disease. If you are at higher risk for complications, take these extra precautions:  Avoid close contact with people who are sick or have a fever or cough. Stay at least 3-6 ft (1-2 m) away from them, if possible.  Wash your hands often with soap and  water for at least 20 seconds.  Avoid touching your face, mouth, nose, or eyes.  Keep supplies on hand at home, such as food, medicine, and cleaning supplies.  Stay home as much as possible.  Avoid social gatherings and travel. How does coronavirus disease spread? The virus that causes coronavirus disease spreads easily from person to person (is contagious). There are also cases of community-spread disease. This means the disease has spread to:  People who have no known contact with other infected people.  People who have not traveled to areas where there are known cases. It appears to spread from one person to another through droplets from coughing or sneezing. Can I get the virus from touching surfaces or objects? There is still a lot that we do not know about the virus that causes coronavirus disease. Scientists are basing a lot of information on what they know about similar viruses, such as:  Viruses cannot generally survive on surfaces for long. They need a human body (host) to survive.  It is more likely that the virus is spread by close contact with people who are sick (direct contact), such as through: ? Shaking hands or hugging. ? Breathing in respiratory droplets that travel through the air. This can happen when an infected person coughs or sneezes on or near other people.  It is less likely that the virus is spread when a person touches a surface or object that has the virus on it (indirect contact). The virus may be able to enter the body if the person touches a surface or object and then touches his or her face, eyes, nose, or mouth. Can a person spread the virus without having symptoms of the disease? It may be possible for the virus to spread before a  person has symptoms of the disease, but this is most likely not the main way the virus is spreading. It is more likely for the virus to spread by being in close contact with people who are sick and breathing in the respiratory  droplets of a sick person's cough or sneeze. What are the symptoms of coronavirus disease? Symptoms vary from person to person and can range from mild to severe. Symptoms may include:  Fever.  Cough.  Tiredness, weakness, or fatigue.  Fast breathing or feeling short of breath. These symptoms can appear anywhere from 2 to 14 days after you have been exposed to the virus. If you develop symptoms, call your health care provider. People with severe symptoms may need hospital care. If I am exposed to the virus, how long does it take before symptoms start? Symptoms of coronavirus disease may appear anywhere from 2 to 14 days after a person has been exposed to the virus. If you develop symptoms, call your health care provider. Should I be tested for this virus? Your health care provider will decide whether to test you based on your symptoms, history of exposure, and your risk factors. How does a health care provider test for this virus? Health care providers will collect samples to send for testing. Samples may include:  Taking a swab of fluid from the nose.  Taking fluid from the lungs by having you cough up mucus (sputum) into a sterile cup.  Taking a blood sample.  Taking a stool or urine sample. Is there a treatment or vaccine for this virus? Currently, there is no vaccine to prevent coronavirus disease. Also, there are no medicines like antibiotics or antivirals to treat the virus. A person who becomes sick is given supportive care, which means rest and fluids. A person may also relieve his or her symptoms by using over-the-counter medicines that treat sneezing, coughing, and runny nose. These are the same medicines that a person takes for the common cold. If you develop symptoms, call your health care provider. People with severe symptoms may need hospital care. What can I do to protect myself and my family from this virus?     You can protect yourself and your family by taking the  same actions that you would take to prevent the spread of other viruses. Take the following actions:  Wash your hands often with soap and water for at least 20 seconds. If soap and water are not available, use alcohol-based hand sanitizer.  Avoid touching your face, mouth, nose, or eyes.  Cough or sneeze into a tissue, sleeve, or elbow. Do not cough or sneeze into your hand or the air. ? If you cough or sneeze into a tissue, throw it away immediately and wash your hands.  Disinfect objects and surfaces that you frequently touch every day.  Avoid close contact with people who are sick or have a fever or cough. Stay at least 3-6 ft (1-2 m) away from them, if possible.  Stay home if you are sick, except to get medical care. Call your health care provider before you get medical care.  Make sure your vaccines are up to date. Ask your health care provider what vaccines you need. What should I do if I need to travel? Follow travel recommendations from your local health authority, the CDC, and WHO. Travel information and advice  Centers for Disease Control and Prevention (CDC): BodyEditor.hu  World Health Organization [facility]): ThirdIncome.ca Know the risks and take action to protect your  health  You are at higher risk of getting coronavirus disease if you are traveling to areas with an outbreak or if you are exposed to travelers from areas with an outbreak.  Wash your hands often and practice good hygiene to lower the risk of catching or spreading the virus. What should I do if I am sick? General instructions to stop the spread of infection  Wash your hands often with soap and water for at least 20 seconds. If soap and water are not available, use alcohol-based hand sanitizer.  Cough or sneeze into a tissue, sleeve, or elbow. Do not cough or sneeze into your hand or the air.  If you cough or  sneeze into a tissue, throw it away immediately and wash your hands.  Stay home unless you must get medical care. Call your health care provider or local health authority before you get medical care.  Avoid public areas. Do not take public transportation, if possible.  If you can, wear a mask if you must go out of the house or if you are in close contact with someone who is not sick. Keep your home clean  Disinfect objects and surfaces that are frequently touched every day. This may include: ? Counters and tables. ? Doorknobs and light switches. ? Sinks and faucets. ? Electronics such as phones, remote controls, keyboards, computers, and tablets.  Wash dishes in hot, soapy water or use a dishwasher. Air-dry your dishes.  Wash laundry in hot water. Prevent infecting other household members  Let healthy household members care for children and pets, if possible. If you have to care for children or pets, wash your hands often and wear a mask.  Sleep in a different bedroom or bed, if possible.  Do not share personal items, such as razors, toothbrushes, deodorant, combs, brushes, towels, and washcloths. Where to find more information Centers for Disease Control and Prevention (CDC)  Information and news updates: https://www.butler-gonzalez.com/ World Health Organization [facility])  Information and news updates: MissExecutive.com.ee  Coronavirus health topic: https://www.castaneda.info/  Questions and answers on COVID-19: OpportunityDebt.at  Global tracker: who.sprinklr.com American Academy of Pediatrics (AAP)  Information for families: www.healthychildren.org/English/health-issues/conditions/chest-lungs/Pages/2019-Novel-Coronavirus.aspx The coronavirus situation is changing rapidly. Check your local health authority website or the CDC and [facility] websites for updates and news. When should I contact a health care  provider?  Contact your health care provider if you have symptoms of an infection, such as fever or cough, and you: ? Have been near anyone who is known to have coronavirus disease. ? Have come into contact with a person who is suspected to have coronavirus disease. ? Have traveled outside of the country. When should I get emergency medical care?  Get help right away by calling your local emergency services (911 in the [place] if you have: ? Trouble breathing. ? Pain or pressure in your chest. ? Confusion. ? Blue-tinged lips and fingernails. ? Difficulty waking from sleep. ? Symptoms that get worse. Let the emergency medical personnel know if you think you have coronavirus disease. Summary  A new respiratory virus is spreading from person to person and causing COVID-19 (coronavirus disease).  The virus that causes COVID-19 appears to spread easily. It spreads from one person to another through droplets from coughing or sneezing.  Older adults and those with chronic diseases are at higher risk of disease. If you are at higher risk for complications, take extra precautions.  There is currently no vaccine to prevent coronavirus disease. There are no medicines, such as  antibiotics or antivirals, to treat the virus.  You can protect yourself and your family by washing your hands often, avoiding touching your face, and covering your coughs and sneezes. This information is not intended to replace advice given to you by your health care provider. Make sure you discuss any questions you have with your health care provider. Document Released: 01/26/2019 Document Revised: 01/26/2019 Document Reviewed: 01/26/2019 Elsevier Patient Education  2020 [place].  COVID-19: How to Protect Yourself and Others Know how it spreads  There is currently no vaccine to prevent coronavirus disease 2019 (COVID-19).  The best way to prevent illness is to avoid being exposed to this virus.  The virus is  thought to spread mainly from person-to-person. ? Between people who are in close contact with one another (within about 6 feet). ? Through respiratory droplets produced when an infected person coughs, sneezes or talks. ? These droplets can land in the mouths or noses of people who are nearby or possibly be inhaled into the lungs. ? Some recent studies have suggested that COVID-19 may be spread by people who are not showing symptoms. Everyone should Clean your hands often  Wash your hands often with soap and water for at least 20 seconds especially after you have been in a public place, or after blowing your nose, coughing, or sneezing.  If soap and water are not readily available, use a hand sanitizer that contains at least 60% alcohol. Cover all surfaces of your hands and rub them together until they feel dry.  Avoid touching your eyes, nose, and mouth with unwashed hands. Avoid close contact  Stay home if you are sick.  Avoid close contact with people who are sick.  Put distance between yourself and other people. ? Remember that some people without symptoms may be able to spread virus. ? This is especially important for people who are at higher risk of getting very GainPain.com.cy Cover your mouth and nose with a cloth face cover when around others  You could spread COVID-19 to others even if you do not feel sick.  Everyone should wear a cloth face cover when they have to go out in public, for example to the grocery store or to pick up other necessities. ? Cloth face coverings should not be placed on young children under age 32, anyone who has trouble breathing, or is unconscious, incapacitated or otherwise unable to remove the mask without assistance.  The cloth face cover is meant to protect other people in case you are infected.  Do NOT use a facemask meant for a Dietitian.  Continue to keep about 6  feet between yourself and others. The cloth face cover is not a substitute for social distancing. Cover coughs and sneezes  If you are in a private setting and do not have on your cloth face covering, remember to always cover your mouth and nose with a tissue when you cough or sneeze or use the inside of your elbow.  Throw used tissues in the trash.  Immediately wash your hands with soap and water for at least 20 seconds. If soap and water are not readily available, clean your hands with a hand sanitizer that contains at least 60% alcohol. Clean and disinfect  Clean AND disinfect frequently touched surfaces daily. This includes tables, doorknobs, light switches, countertops, handles, desks, phones, keyboards, toilets, faucets, and sinks. RackRewards.fr  If surfaces are dirty, clean them: Use detergent or soap and water prior to disinfection.  Then, use a  household disinfectant. You can see a list of EPA-registered household disinfectants here. SouthAmericaFlowers.co.ukcdc.gov/coronavirus 02/16/2019 This information is not intended to replace advice given to you by your health care provider. Make sure you discuss any questions you have with your health care provider. Document Released: 01/26/2019 Document Revised: 02/24/2019 Document Reviewed: 01/26/2019 Elsevier Patient Education  2020 Elsevier Inc. COVID-19: How to Protect Yourself and Others Know how it spreads  There is currently no vaccine to prevent coronavirus disease 2019 (COVID-19).  The best way to prevent illness is to avoid being exposed to this virus.  The virus is thought to spread mainly from person-to-person. ? Between people who are in close contact with one another (within about 6 feet). ? Through respiratory droplets produced when an infected person coughs, sneezes or talks. ? These droplets can land in the mouths or noses of people who are nearby or possibly be inhaled into  the lungs. ? Some recent studies have suggested that COVID-19 may be spread by people who are not showing symptoms. Everyone should Clean your hands often  Wash your hands often with soap and water for at least 20 seconds especially after you have been in a public place, or after blowing your nose, coughing, or sneezing.  If soap and water are not readily available, use a hand sanitizer that contains at least 60% alcohol. Cover all surfaces of your hands and rub them together until they feel dry.  Avoid touching your eyes, nose, and mouth with unwashed hands. Avoid close contact  Stay home if you are sick.  Avoid close contact with people who are sick.  Put distance between yourself and other people. ? Remember that some people without symptoms may be able to spread virus. ? This is especially important for people who are at higher risk of getting very RetroStamps.itsick.www.cdc.gov/coronavirus/2019-ncov/need-extra-precautions/people-at-higher-risk.html Cover your mouth and nose with a cloth face cover when around others  You could spread COVID-19 to others even if you do not feel sick.  Everyone should wear a cloth face cover when they have to go out in public, for example to the grocery store or to pick up other necessities. ? Cloth face coverings should not be placed on young children under age 59, anyone who has trouble breathing, or is unconscious, incapacitated or otherwise unable to remove the mask without assistance.  The cloth face cover is meant to protect other people in case you are infected.  Do NOT use a facemask meant for a Research scientist (physical sciences)healthcare worker.  Continue to keep about 6 feet between yourself and others. The cloth face cover is not a substitute for social distancing. Cover coughs and sneezes  If you are in a private setting and do not have on your cloth face covering, remember to always cover your mouth and nose with a tissue when you cough or sneeze or use the inside of your  elbow.  Throw used tissues in the trash.  Immediately wash your hands with soap and water for at least 20 seconds. If soap and water are not readily available, clean your hands with a hand sanitizer that contains at least 60% alcohol. Clean and disinfect  Clean AND disinfect frequently touched surfaces daily. This includes tables, doorknobs, light switches, countertops, handles, desks, phones, keyboards, toilets, faucets, and sinks. ktimeonline.comwww.cdc.gov/coronavirus/2019-ncov/prevent-getting-sick/disinfecting-your-home.html  If surfaces are dirty, clean them: Use detergent or soap and water prior to disinfection.  Then, use a household disinfectant. You can see a list of EPA-registered household disinfectants here. SouthAmericaFlowers.co.ukcdc.gov/coronavirus 02/16/2019 This information is  not intended to replace advice given to you by your health care provider. Make sure you discuss any questions you have with your health care provider. Document Released: 01/26/2019 Document Revised: 02/24/2019 Document Reviewed: 01/26/2019 Elsevier Patient Education  2020 [employer]   COVID-19 Frequently Asked Questions COVID-19 (coronavirus disease) is an infection that is caused by a large family of viruses. Some viruses cause illness in people and others cause illness in animals like camels, cats, and bats. In some cases, the viruses that cause illness in animals can spread to humans. Where did the coronavirus come from? In December 2019, [place] told the [employer] [facility](WHO) of several cases of lung disease (human respiratory illness). These cases were linked to an [place] seafood and [place]. The link to the seafood and livestock market suggests that the virus may have spread from animals to humans. However, since that first outbreak in December, the virus has also been shown to spread from person to person. What is the name of the disease and the virus? Disease name Early on, this disease was  called novel coronavirus. This is because scientists determined that the disease was caused by a new (novel) respiratory virus. The World Health Organization [facility](WHO) has now named the disease COVID-19, or coronavirus disease. Virus name The virus that causes the disease is called severe acute respiratory syndrome coronavirus 2 (SARS-CoV-2). More information on disease and virus naming World Health Organization [facility](WHO): www.who.int/emergencies/diseases/novel-coronavirus-2019/technical-guidance/naming-the-coronavirus-disease-(covid-2019)-and-the-virus-that-causes-it Who is at risk for complications from coronavirus disease? Some people may be at higher risk for complications from coronavirus disease. This includes older adults and people who have chronic diseases, such as heart disease, diabetes, and lung disease. If you are at higher risk for complications, take these extra precautions:  Avoid close contact with people who are sick or have a fever or cough. Stay at least 3-6 ft (1-2 m) away from them, if possible.  Wash your hands often with soap and water for at least 20 seconds.  Avoid touching your face, mouth, nose, or eyes.  Keep supplies on hand at home, such as food, medicine, and cleaning supplies.  Stay home as much as possible.  Avoid social gatherings and travel. How does coronavirus disease spread? The virus that causes coronavirus disease spreads easily from person to person (is contagious). There are also cases of community-spread disease. This means the disease has spread to:  People who have no known contact with other infected people.  People who have not traveled to areas where there are known cases. It appears to spread from one person to another through droplets from coughing or sneezing. Can I get the virus from touching surfaces or objects? There is still a lot that we do not know about the virus that causes coronavirus disease. Scientists are basing a lot of information  on what they know about similar viruses, such as:  Viruses cannot generally survive on surfaces for long. They need a human body (host) to survive.  It is more likely that the virus is spread by close contact with people who are sick (direct contact), such as through: ? Shaking hands or hugging. ? Breathing in respiratory droplets that travel through the air. This can happen when an infected person coughs or sneezes on or near other people.  It is less likely that the virus is spread when a person touches a surface or object that has the virus on it (indirect contact). The virus may be able to enter  the body if the person touches a surface or object and then touches his or her face, eyes, nose, or mouth. Can a person spread the virus without having symptoms of the disease? It may be possible for the virus to spread before a person has symptoms of the disease, but this is most likely not the main way the virus is spreading. It is more likely for the virus to spread by being in close contact with people who are sick and breathing in the respiratory droplets of a sick person's cough or sneeze. What are the symptoms of coronavirus disease? Symptoms vary from person to person and can range from mild to severe. Symptoms may include:  Fever.  Cough.  Tiredness, weakness, or fatigue.  Fast breathing or feeling short of breath. These symptoms can appear anywhere from 2 to 14 days after you have been exposed to the virus. If you develop symptoms, call your health care provider. People with severe symptoms may need hospital care. If I am exposed to the virus, how long does it take before symptoms start? Symptoms of coronavirus disease may appear anywhere from 2 to 14 days after a person has been exposed to the virus. If you develop symptoms, call your health care provider. Should I be tested for this virus? Your health care provider will decide whether to test you based on your symptoms, history of  exposure, and your risk factors. How does a health care provider test for this virus? Health care providers will collect samples to send for testing. Samples may include:  Taking a swab of fluid from the nose.  Taking fluid from the lungs by having you cough up mucus (sputum) into a sterile cup.  Taking a blood sample.  Taking a stool or urine sample. Is there a treatment or vaccine for this virus? Currently, there is no vaccine to prevent coronavirus disease. Also, there are no medicines like antibiotics or antivirals to treat the virus. A person who becomes sick is given supportive care, which means rest and fluids. A person may also relieve his or her symptoms by using over-the-counter medicines that treat sneezing, coughing, and runny nose. These are the same medicines that a person takes for the common cold. If you develop symptoms, call your health care provider. People with severe symptoms may need hospital care. What can I do to protect myself and my family from this virus?     You can protect yourself and your family by taking the same actions that you would take to prevent the spread of other viruses. Take the following actions:  Wash your hands often with soap and water for at least 20 seconds. If soap and water are not available, use alcohol-based hand sanitizer.  Avoid touching your face, mouth, nose, or eyes.  Cough or sneeze into a tissue, sleeve, or elbow. Do not cough or sneeze into your hand or the air. ? If you cough or sneeze into a tissue, throw it away immediately and wash your hands.  Disinfect objects and surfaces that you frequently touch every day.  Avoid close contact with people who are sick or have a fever or cough. Stay at least 3-6 ft (1-2 m) away from them, if possible.  Stay home if you are sick, except to get medical care. Call your health care provider before you get medical care.  Make sure your vaccines are up to date. Ask your health care  provider what vaccines you need. What should I do if  I need to travel? Follow travel recommendations from your local health authority, the CDC, and WHO. Travel information and advice  Centers for Disease Control and Prevention (CDC): BodyEditor.hu  World Health Organization [facility]): ThirdIncome.ca Know the risks and take action to protect your health  You are at higher risk of getting coronavirus disease if you are traveling to areas with an outbreak or if you are exposed to travelers from areas with an outbreak.  Wash your hands often and practice good hygiene to lower the risk of catching or spreading the virus. What should I do if I am sick? General instructions to stop the spread of infection  Wash your hands often with soap and water for at least 20 seconds. If soap and water are not available, use alcohol-based hand sanitizer.  Cough or sneeze into a tissue, sleeve, or elbow. Do not cough or sneeze into your hand or the air.  If you cough or sneeze into a tissue, throw it away immediately and wash your hands.  Stay home unless you must get medical care. Call your health care provider or local health authority before you get medical care.  Avoid public areas. Do not take public transportation, if possible.  If you can, wear a mask if you must go out of the house or if you are in close contact with someone who is not sick. Keep your home clean  Disinfect objects and surfaces that are frequently touched every day. This may include: ? Counters and tables. ? Doorknobs and light switches. ? Sinks and faucets. ? Electronics such as phones, remote controls, keyboards, computers, and tablets.  Wash dishes in hot, soapy water or use a dishwasher. Air-dry your dishes.  Wash laundry in hot water. Prevent infecting other household members  Let healthy household members care for children and  pets, if possible. If you have to care for children or pets, wash your hands often and wear a mask.  Sleep in a different bedroom or bed, if possible.  Do not share personal items, such as razors, toothbrushes, deodorant, combs, brushes, towels, and washcloths. Where to find more information Centers for Disease Control and Prevention (CDC)  Information and news updates: https://www.butler-gonzalez.com/ World Health Organization [facility])  Information and news updates: MissExecutive.com.ee  Coronavirus health topic: https://www.castaneda.info/  Questions and answers on COVID-19: OpportunityDebt.at  Global tracker: who.sprinklr.com American Academy of Pediatrics (AAP)  Information for families: www.healthychildren.org/English/health-issues/conditions/chest-lungs/Pages/2019-Novel-Coronavirus.aspx The coronavirus situation is changing rapidly. Check your local health authority website or the CDC and [facility] websites for updates and news. When should I contact a health care provider?  Contact your health care provider if you have symptoms of an infection, such as fever or cough, and you: ? Have been near anyone who is known to have coronavirus disease. ? Have come into contact with a person who is suspected to have coronavirus disease. ? Have traveled outside of the country. When should I get emergency medical care?  Get help right away by calling your local emergency services (911 in the [place] if you have: ? Trouble breathing. ? Pain or pressure in your chest. ? Confusion. ? Blue-tinged lips and fingernails. ? Difficulty waking from sleep. ? Symptoms that get worse. Let the emergency medical personnel know if you think you have coronavirus disease. Summary  A new respiratory virus is spreading from person to person and causing COVID-19 (coronavirus disease).  The virus that causes COVID-19 appears to spread  easily. It spreads from one person to another through droplets from  coughing or sneezing.  Older adults and those with chronic diseases are at higher risk of disease. If you are at higher risk for complications, take extra precautions.  There is currently no vaccine to prevent coronavirus disease. There are no medicines, such as antibiotics or antivirals, to treat the virus.  You can protect yourself and your family by washing your hands often, avoiding touching your face, and covering your coughs and sneezes. This information is not intended to replace advice given to you by your health care provider. Make sure you discuss any questions you have with your health care provider. Document Released: 01/26/2019 Document Revised: 01/26/2019 Document Reviewed: 01/26/2019 Elsevier Patient Education  2020 [employer]   COVID-19 COVID-19 is a respiratory infection that is caused by a virus called severe acute respiratory syndrome coronavirus 2 (SARS-CoV-2). The disease is also known as coronavirus disease or novel coronavirus. In some people, the virus may not cause any symptoms. In others, it may cause a serious infection. The infection can get worse quickly and can lead to complications, such as:  Pneumonia, or infection of the lungs.  Acute respiratory distress syndrome or ARDS. This is fluid build-up in the lungs.  Acute respiratory failure. This is a condition in which there is not enough oxygen passing from the lungs to the body.  Sepsis or septic shock. This is a serious bodily reaction to an infection.  Blood clotting problems.  Secondary infections due to bacteria or fungus. The virus that causes COVID-19 is contagious. This means that it can spread from person to person through droplets from coughs and sneezes (respiratory secretions). What are the causes? This illness is caused by a virus. You may catch the virus by:  Breathing in droplets from an infected person's cough or  sneeze.  Touching something, like a table or a doorknob, that was exposed to the virus (contaminated) and then touching your mouth, nose, or eyes. What increases the risk? Risk for infection You are more likely to be infected with this virus if you:  Live in or travel to an area with a COVID-19 outbreak.  Come in contact with a sick person who recently traveled to an area with a COVID-19 outbreak.  Provide care for or live with a person who is infected with COVID-19. Risk for serious illness You are more likely to become seriously ill from the virus if you:  Are 43 years of age or older.  Have a long-term disease that lowers your body's ability to fight infection (immunocompromised).  Live in a nursing home or long-term care facility.  Have a long-term (chronic) disease such as: ? Chronic lung disease, including chronic obstructive pulmonary disease or asthma ? Heart disease. ? Diabetes. ? Chronic kidney disease. ? Liver disease.  Are obese. What are the signs or symptoms? Symptoms of this condition can range from mild to severe. Symptoms may appear any time from 2 to 14 days after being exposed to the virus. They include:  A fever.  A cough.  Difficulty breathing.  Chills.  Muscle pains.  A sore throat.  Loss of taste or smell. Some people may also have stomach problems, such as nausea, vomiting, or diarrhea. Other people may not have any symptoms of COVID-19. How is this diagnosed? This condition may be diagnosed based on:  Your signs and symptoms, especially if: ? You live in an area with a COVID-19 outbreak. ? You recently traveled to or from an area where the virus is common. ? You  provide care for or live with a person who was diagnosed with COVID-19.  A physical exam.  Lab tests, which may include: ? A nasal swab to take a sample of fluid from your nose. ? A throat swab to take a sample of fluid from your throat. ? A sample of mucus from your lungs  (sputum). ? Blood tests.  Imaging tests, which may include, X-rays, CT scan, or ultrasound. How is this treated? At present, there is no medicine to treat COVID-19. Medicines that treat other diseases are being used on a trial basis to see if they are effective against COVID-19. Your health care provider will talk with you about ways to treat your symptoms. For most people, the infection is mild and can be managed at home with rest, fluids, and over-the-counter medicines. Treatment for a serious infection usually takes places in a hospital intensive care unit (ICU). It may include one or more of the following treatments. These treatments are given until your symptoms improve.  Receiving fluids and medicines through an IV.  Supplemental oxygen. Extra oxygen is given through a tube in the nose, a face mask, or a hood.  Positioning you to lie on your stomach (prone position). This makes it easier for oxygen to get into the lungs.  Continuous positive airway pressure (CPAP) or bi-level positive airway pressure (BPAP) machine. This treatment uses mild air pressure to keep the airways open. A tube that is connected to a motor delivers oxygen to the body.  Ventilator. This treatment moves air into and out of the lungs by using a tube that is placed in your windpipe.  Tracheostomy. This is a procedure to create a hole in the neck so that a breathing tube can be inserted.  Extracorporeal membrane oxygenation (ECMO). This procedure gives the lungs a chance to recover by taking over the functions of the heart and lungs. It supplies oxygen to the body and removes carbon dioxide. Follow these instructions at home: Lifestyle  If you are sick, stay home except to get medical care. Your health care provider will tell you how long to stay home. Call your health care provider before you go for medical care.  Rest at home as told by your health care provider.  Do not use any products that contain nicotine  or tobacco, such as cigarettes, e-cigarettes, and chewing tobacco. If you need help quitting, ask your health care provider.  Return to your normal activities as told by your health care provider. Ask your health care provider what activities are safe for you. General instructions  Take over-the-counter and prescription medicines only as told by your health care provider.  Drink enough fluid to keep your urine pale yellow.  Keep all follow-up visits as told by your health care provider. This is important. How is this prevented?  There is no vaccine to help prevent COVID-19 infection. However, there are steps you can take to protect yourself and others from this virus. To protect yourself:   Do not travel to areas where COVID-19 is a risk. The areas where COVID-19 is reported change often. To identify high-risk areas and travel restrictions, check the CDC travel website: StageSync.si  If you live in, or must travel to, an area where COVID-19 is a risk, take precautions to avoid infection. ? Stay away from people who are sick. ? Wash your hands often with soap and water for 20 seconds. If soap and water are not available, use an alcohol-based hand sanitizer. ? Avoid  touching your mouth, face, eyes, or nose. ? Avoid going out in public, follow guidance from your state and local health authorities. ? If you must go out in public, wear a cloth face covering or face mask. ? Disinfect objects and surfaces that are frequently touched every day. This may include:  Counters and tables.  Doorknobs and light switches.  Sinks and faucets.  Electronics, such as phones, remote controls, keyboards, computers, and tablets. To protect others: If you have symptoms of COVID-19, take steps to prevent the virus from spreading to others.  If you think you have a COVID-19 infection, contact your health care provider right away. Tell your health care team that you think you may have a  COVID-19 infection.  Stay home. Leave your house only to seek medical care. Do not use public transport.  Do not travel while you are sick.  Wash your hands often with soap and water for 20 seconds. If soap and water are not available, use alcohol-based hand sanitizer.  Stay away from other members of your household. Let healthy household members care for children and pets, if possible. If you have to care for children or pets, wash your hands often and wear a mask. If possible, stay in your own room, separate from others. Use a different bathroom.  Make sure that all people in your household wash their hands well and often.  Cough or sneeze into a tissue or your sleeve or elbow. Do not cough or sneeze into your hand or into the air.  Wear a cloth face covering or face mask. Where to find more information  Centers for Disease Control and Prevention: StickerEmporium.tn  World Health Organization: https://thompson-craig.com/ Contact a health care provider if:  You live in or have traveled to an area where COVID-19 is a risk and you have symptoms of the infection.  You have had contact with someone who has COVID-19 and you have symptoms of the infection. Get help right away if:  You have trouble breathing.  You have pain or pressure in your chest.  You have confusion.  You have bluish lips and fingernails.  You have difficulty waking from sleep.  You have symptoms that get worse. These symptoms may represent a serious problem that is an emergency. Do not wait to see if the symptoms will go away. Get medical help right away. Call your local emergency services (911 in the [place] Do not drive yourself to the hospital. Let the emergency medical personnel know if you think you have COVID-19. Summary  COVID-19 is a respiratory infection that is caused by a virus. It is also known as coronavirus disease or novel coronavirus. It can cause serious  infections, such as pneumonia, acute respiratory distress syndrome, acute respiratory failure, or sepsis.  The virus that causes COVID-19 is contagious. This means that it can spread from person to person through droplets from coughs and sneezes.  You are more likely to develop a serious illness if you are 27 years of age or older, have a weak immunity, live in a nursing home, or have chronic disease.  There is no medicine to treat COVID-19. Your health care provider will talk with you about ways to treat your symptoms.  Take steps to protect yourself and others from infection. Wash your hands often and disinfect objects and surfaces that are frequently touched every day. Stay away from people who are sick and wear a mask if you are sick. This information is not intended to replace  advice given to you by your health care provider. Make sure you discuss any questions you have with your health care provider. Document Released: 11/05/2018 Document Revised: 02/25/2019 Document Reviewed: 11/05/2018 Elsevier Patient Education  2020 [employer].

## 2019-05-02 NOTE — Discharge Summary (Signed)
DISCHARGE SUMMARY  Gabriel Garcia  MR#: [id number]  DOB:02/19/1976  Date of Admission: 05/01/2019 Date of Discharge: 05/02/2019  Attending Physician:Vernice Mannina Silvestre Gunner Mizraim Harmening, Garcia  Patient's NWG:NFAOZHPCP:Gabriel Garcia  Consults: none  Disposition: D/C home   Follow-up Appts: Follow-up Information    Shelva MajesticHunter, Stephen O, Garcia Follow up in 3 day(s).   Specialty: Family Medicine Contact information: [address] [place] [address] [phone]          Tests Needing Follow-up: -assess O2 saturations   Discharge Diagnoses: COVID Pneumonia HTN History of Hepatitis C Obesity with OSA - Body mass index is 31.57 kg/m.  Chronic low back pain History of cocaine abuse Alcoholism  Initial presentation: 43 year old with a history of NSAID related duodenal ulcer, GERD, treated hepatitis C, HTN, legally blind OD, obstructive sleep apnea, and obesity who presented to the ED with complaints of hypoxia and body aches.  He stated he was diagnosed with COVID-19 2 days prior.  He was monitoring his saturations at home and noted them to drop as low as 80 on RA and therefore returned to the ED.  In the emergency room the patient was found to be saturating at 90% on room air.  With exertion he dipped down to 88% per hx noted in the records.  Hospital Course:  COVID Pneumonia - Acute hypoxic respiratory failure No fever - no infiltrate on CXR - not presently a candidate for steroids, Remdesivir, or Actemra - inflammatory markers all normal -given the concern for significant hypoxia at home the patient was observed in the hospital -despite volume resuscitation he did not develop hypoxia -ambulatory pulse oximetry was assessed just prior to discharge and his oxygen saturation never dropped below 92% -he remained physiologically stable -he was counseled extensively as to the need to continue to isolate at home until 14 days have passed since the onset of his symptoms -he was educated on  the possible recrudescence of his symptoms and the need to return to the emergency room should he develop severe persistent shortness of breath ongoing high fevers  HTN BP well controlled   History of hepatitis C Genotype 1a - F3/F4 fibrosis on hepatic elastography performed 3 years ago - tx w/ Harvoni by Dr. Dulce Sellarutlaw w/ undetectable viral load post tx  Obesity with OSA - Body mass index is 31.57 kg/m.   Chronic low back pain Treated with 1 dose of Ultram during hospital stay -no narcotics prescribed at time of discharge  History of cocaine abuse and Alcoholism Patient was congratulated on 6 months of sobriety and encouraged to continue  Allergies as of 05/02/2019   No Known Allergies     Medication List    TAKE these medications   acetaminophen 325 MG tablet Commonly known as: TYLENOL Take 650 mg by mouth every 6 (six) hours as needed for mild pain, fever or headache.   ferrous sulfate 325 (65 FE) MG tablet Take 325 mg by mouth daily with breakfast.   lisinopril-hydrochlorothiazide 20-12.5 MG tablet Commonly known as: ZESTORETIC Take 1 tablet by mouth daily.   valACYclovir 500 MG tablet Commonly known as: VALTREX Take 1 tablet (500 mg total) by mouth 2 (two) times daily. For 3 days with outbreaks What changed:   when to take this  reasons to take this  additional instructions       Day of Discharge BP 119/84 (BP Location: Left Arm)   Pulse (!) 57   Temp (!) 97.5 F (36.4 C) (Oral)   Resp  15   Ht 5\' 10"  (1.778 m)   Wt 99.8 kg   SpO2 97%   BMI 31.57 kg/m   Physical Exam: General: No acute respiratory distress Lungs: Clear to auscultation bilaterally without wheezes or crackles Cardiovascular: Regular rate and rhythm without murmur gallop or rub normal S1 and S2 Abdomen: Nontender, nondistended, soft, bowel sounds positive, no rebound, no ascites, no appreciable mass Extremities: No significant cyanosis, clubbing, or edema bilateral lower extremities   Basic Metabolic Panel: Recent Labs  Lab 04/29/19 0931 05/01/19 0732 05/01/19 1610 05/02/19 0208  NA 138 136  --  139  K 3.5 3.5  --  3.6  CL 104 103  --  102  CO2 24 23  --  28  GLUCOSE 94 115*  --  115*  BUN 14 14  --  14  CREATININE 1.12 1.12 1.08 1.01  CALCIUM 9.0 9.0  --  9.2  MG  --   --   --  2.2    Liver Function Tests: Recent Labs  Lab 04/29/19 0931 05/01/19 0732 05/02/19 0208  AST 17 19 15   ALT 21 20 20   ALKPHOS 46 48 46  BILITOT 0.5 0.5 0.3  PROT 7.5 7.8 7.5  ALBUMIN 4.1 4.2 4.0   CBC: Recent Labs  Lab 04/29/19 0931 05/01/19 0732 05/01/19 1610 05/02/19 0208  WBC 4.9 3.8* 3.6* 4.7  NEUTROABS 3.0 1.8  --  3.0  HGB 13.8 14.5 14.5 14.1  HCT 43.4 44.9 44.7 45.4  MCV 89.9 89.3 88.5 88.8  PLT 181 190 199 222    Recent Results (from the past 240 hour(s))  SARS Coronavirus 2 (CEPHEID- Performed in [place] hospital lab), Hosp Order     Status: Abnormal   Collection Time: 04/29/19  9:35 AM   Specimen: Nasopharyngeal Swab  Result Value Ref Range Status   SARS Coronavirus 2 POSITIVE (A) NEGATIVE Final    Comment: RESULT CALLED TO, READ BACK BY AND VERIFIED WITH: S.WEST RN AT [id number] ON 04/29/2019 BY S.VANHOORNE (NOTE) If result is NEGATIVE SARS-CoV-2 target nucleic acids are NOT DETECTED. The SARS-CoV-2 RNA is generally detectable in upper and lower  respiratory specimens during the acute phase of infection. The lowest  concentration of SARS-CoV-2 viral copies this assay can detect is 250  copies / mL. A negative result does not preclude SARS-CoV-2 infection  and should not be used as the sole basis for treatment or other  patient management decisions.  A negative result may occur with  improper specimen collection / handling, submission of specimen other  than nasopharyngeal swab, presence of viral mutation(s) within the  areas targeted by this assay, and inadequate number of viral copies  (<250 copies / mL). A negative result must be combined with  clinical  observations, patient history, and epidemiological information. If result is POSITIVE SARS-CoV-2 target nucleic acids are DE TECTED. The SARS-CoV-2 RNA is generally detectable in upper and lower  respiratory specimens during the acute phase of infection.  Positive  results are indicative of active infection with SARS-CoV-2.  Clinical  correlation with patient history and other diagnostic information is  necessary to determine patient infection status.  Positive results do  not rule out bacterial infection or co-infection with other viruses. If result is PRESUMPTIVE POSTIVE SARS-CoV-2 nucleic acids MAY BE PRESENT.   A presumptive positive result was obtained on the submitted specimen  and confirmed on repeat testing.  While 2019 novel coronavirus  (SARS-CoV-2) nucleic acids may be present in the submitted  sample  additional confirmatory testing may be necessary for epidemiological  and / or clinical management purposes  to differentiate between  SARS-CoV-2 and other Sarbecovirus currently known to infect humans.  If clinically indicated additional testing with an alternate test  methodology (L [id number]) is advised. The SARS-CoV-2 RNA is generally  detectable in upper and lower respiratory specimens during the acute  phase of infection. The expected result is Negative. Fact Sheet for Patients:  BoilerBrush.com.cyhttps://www.fda.gov/media/136312/download Fact Sheet for Healthcare Providers: https://pope.com/https://www.fda.gov/media/136313/download This test is not yet approved or cleared by the [place] FDA and has been authorized for detection and/or diagnosis of SARS-CoV-2 by FDA under an Emergency Use Authorization (EUA).  This EUA will remain in effect (meaning this test can be used) for the duration of the COVID-19 declaration under Section 564(b)(1) of the Act, 21 U.S.C. section 360bbb-3(b)(1), unless the authorization is terminated or revoked sooner. Performed at [facility],  [address] [place], [place] [address]      Time spent in discharge (includes decision making & examination of pt): 30 minutes  05/02/2019, 2:32 PM   Lonia BloodJeffrey T. Tabita Corbo, Garcia [facility] Office  [phone]

## 2019-05-02 NOTE — Plan of Care (Signed)
  Problem: Education: Goal: Knowledge of risk factors and measures for prevention of condition will improve Outcome: Progressing   Problem: Coping: Goal: Psychosocial and spiritual needs will be supported Outcome: Progressing   Problem: Respiratory: Goal: Will maintain a patent airway Outcome: Progressing Goal: Complications related to the disease process, condition or treatment will be avoided or minimized Outcome: Progressing   

## 2019-05-03 ENCOUNTER — Telehealth: Payer: Self-pay

## 2019-05-03 NOTE — Telephone Encounter (Signed)
PER CHART REVIEW ______________________________  Date of Admission: 05/01/2019 Date of Discharge: 05/02/2019  Attending Physician:Jeffrey Hennie Duos, MD  Patient's BHA:[id number], Brayton Mars, MD  Consults: none  Disposition: D/C home  PER TELEPHONE CALL __________________________  Transition Care Management Follow-up Telephone Call   Date discharged ? 05/02/19   How have you been since you were released from the hospital? Patient have been having symptoms of body aches and pain,runny nose,Also loss of taste and smell.  Do you understand why you were in the hospital? yes   Do you understand the discharge instructions? yes   Where were you discharged to? Home    Items Reviewed:  Medications reviewed: yes  Allergies reviewed: yes  Dietary changes reviewed: yes  Referrals reviewed: yes   Functional Questionnaire:   Activities of Daily Living (ADLs):   He states they are independent in the following: ambulation, bathing and hygiene, feeding, continence, grooming, toileting and dressing States they require assistance with the following: None   Any transportation issues/concerns?: no   Any patient concerns? no   Confirmed importance and date/time of follow-up visits scheduled Yes,Patient declined an appointment at this time,he stated there is no need for a appointment.He stated he will continue to quarantine.  Provider Appointment booked with. No appt booked at this time.  Confirmed with patient if condition begins to worsen call PCP or go to the ER.  Patient was given the office number and encouraged to call back with question or concerns.  : yes

## 2019-05-03 NOTE — Telephone Encounter (Signed)
PER CHART REVIEW

## 2019-05-03 NOTE — Telephone Encounter (Signed)
Gabriel Garcia- he should certainly quarantine- but you can offer him video visit for follow up- its fine if he declines it but you may offer it

## 2019-05-04 NOTE — Telephone Encounter (Signed)
A virtual visit was offered,patient declined

## 2019-05-31 ENCOUNTER — Ambulatory Visit: Payer: BC Managed Care – PPO | Admitting: Family Medicine

## 2019-05-31 ENCOUNTER — Telehealth (INDEPENDENT_AMBULATORY_CARE_PROVIDER_SITE_OTHER): Payer: BC Managed Care – PPO | Admitting: Family Medicine

## 2019-05-31 ENCOUNTER — Other Ambulatory Visit: Payer: Self-pay

## 2019-05-31 ENCOUNTER — Encounter: Payer: Self-pay | Admitting: Family Medicine

## 2019-05-31 ENCOUNTER — Telehealth: Payer: Self-pay | Admitting: Family Medicine

## 2019-05-31 VITALS — Temp 98.2°F | Ht 70.0 in | Wt 230.0 lb

## 2019-05-31 DIAGNOSIS — M10471 Other secondary gout, right ankle and foot: Secondary | ICD-10-CM

## 2019-05-31 MED ORDER — PREDNISONE 20 MG PO TABS: 40.0000 mg | ORAL_TABLET | Freq: Every day | ORAL | 0 refills | Status: DC

## 2019-05-31 NOTE — Progress Notes (Signed)
Patient: Gabriel Garcia MRN: [id number] DOB: 10/15/1975 PCP: Shelva MajesticHunter, Stephen O, MD     I connected with Gabriel GilfordBilly Jason Pompey on 05/31/19 at 11:00am by a video enabled telemedicine application and verified that I am speaking with the correct person using two identifiers.  Location patient: Home Location provider: [facility], Office Persons participating in this virtual visit: Marlowe ShoresJason Culbreath and Dr. Artis FlockWolfe  I discussed the limitations of evaluation and management by telemedicine and the availability of in person appointments. The patient expressed understanding and agreed to proceed.   Subjective:  No chief complaint on file.   HPI: The patient is a 43 y.o. male who presents today for gout flair. He has a history of gout flairs every 3 months. Hx of HTN and alcohol abuse, but has been sober x 6 months. He hasn't eaten fish and can't recall any diet changes. He has eaten red meat, but not excessive amounts. His diet is poor with high trans fat and likley fructose (purine). He is having pain in his right big toe, which is where he already has it. Starts out as a cramp then turns completely red and starts throbbing. He has not taken any medication for this. PCP normally px either prednisone or colchicine. He has had hx of a duodenal ulcer due to NSAIDs per patient this past year and 3 years ago had a tear in his esophagus. (was drinking heavily at that point). No fever/chills and no other joint pain.   Review of Systems  Constitutional: Negative for chills, fatigue and fever.  HENT: Negative.   Eyes: Negative for visual disturbance.  Respiratory: Negative for cough, chest tightness and shortness of breath.   Cardiovascular: Negative.   Gastrointestinal: Negative for abdominal pain, diarrhea, nausea and vomiting.  Genitourinary: Negative.   Musculoskeletal: Positive for arthralgias.       C/o R big toe pain and inflammation.   H/o gout flare ups in the past  Skin: Negative.    Neurological: Negative.   Psychiatric/Behavioral: Negative.     Allergies Patient has No Known Allergies.  Past Medical History Patient  has a past medical history of Acute duodenal ulcer with hemorrhage (10/12/2014), GERD (gastroesophageal reflux disease), GI bleed, Hepatitis C, Hypertension, Legal blindness of right eye, as defined in [place] (2004), Legally blind in right eye, as defined in [place], Mesenteric adenitis, Sliding hiatal hernia, Substance abuse (HCC), and Terminal ileitis (HCC).  Surgical History Patient  has a past surgical history that includes Eye surgery (Right); Foot surgery (Right); Esophagogastroduodenoscopy (N/A, 10/13/2014); Liver biopsy; Esophagogastroduodenoscopy (N/A, 11/17/2016); Esophagogastroduodenoscopy (egd) with propofol (N/A, 09/01/2018); Esophagogastroduodenoscopy (egd) with propofol (N/A, 09/08/2018); and biopsy (09/08/2018).  Family History Pateint's family history includes Alcoholism in his father and mother; Heart failure in his father; Hypertension in his mother; Rheum arthritis in his mother.  Social History Patient  reports that he has never smoked. He has never used smokeless tobacco. He reports that he does not drink alcohol or use drugs.    Objective: Vitals:   05/31/19 1010  Temp: 98.2 F (36.8 C)  TempSrc: Temporal  Weight: 230 lb (104.3 kg)  Height: 5\' 10"  (1.778 m)    Body mass index is 33 kg/m.  Physical Exam Vitals signs reviewed.  Constitutional:      Appearance: He is obese.  HENT:     Head: Normocephalic and atraumatic.  Neurological:     General: No focal deficit present.     Mental Status: He is alert and oriented to person, place,  and time.    At work.. can not see toe     Assessment/plan: 1. 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 Will do course of prednisone since hx of ulcer with NSAIDs. Renal function normal in  July. Recommended increased water and cherry juice. Really needs to change diet and low purine diet sent to him. He's focusing on staying sober and diet may suffer from this. congratulated on staying clean for 6 months. F/u with PCP as he would be good candidate for allopurinol if getting such frequent gout attacks.   F/u as needed with pcp or to discuss allopurinol or other gout prevention.   Orma Flaming, MD [facility]  05/31/2019

## 2019-05-31 NOTE — Telephone Encounter (Signed)
Pt stated he has gout in his toe and want some medication called in as before to. Please call pt to advise if he need a visit or if it can be called in. Pt don't remember name of medication b/c it had been so long ago  [facility]

## 2019-05-31 NOTE — Telephone Encounter (Signed)
See note

## 2019-05-31 NOTE — Patient Instructions (Signed)

## 2019-05-31 NOTE — Telephone Encounter (Signed)
Pt has been scheduled for 11 am with Dr. Rogers Blocker.

## 2019-06-15 NOTE — Patient Instructions (Addendum)
Health Maintenance Due  Topic Date Due  . INFLUENZA VACCINE - declines 05/15/2019   Please stop by lab before you go If you do not have mychart- we will call you about results within 5 business days of [place] receiving them.  If you have mychart- we will send your results within 3 business days of [place] receiving them.  If abnormal or we want to clarify a result, we will call or mychart you to make sure you receive the message.  If you have questions or concerns or don't hear within 5-7 days, please send [place] a message or call us.   We will call you within two weeks about your referral to cardiology and for your chest CT. If you do not hear within 3 weeks, give [place] a call.   If worsening chest pain or shortness of breath go to ER

## 2019-06-15 NOTE — Progress Notes (Signed)
Phone [phone]   Subjective:  Gabriel Garcia is a 43 y.o. year old very pleasant male patient who presents for/with See problem oriented charting Chief Complaint  Patient presents with  . Back Pain   ROS- does have chest pain with exertion, has shortness of breath. No fever/chills.    Past Medical History-  Patient Active Problem List   Diagnosis Date Noted  . COVID-19 virus infection 05/01/2019    Priority: High  . Upper GI bleed 08/31/2018    Priority: High  . Mallory-Weiss tear 12/03/2016    Priority: High  . Cocaine abuse in remission (HCC) 11/16/2015    Priority: High  . Alcoholism (HCC) 11/16/2015    Priority: High  . Hepatitis C     Priority: High  . Hyperlipidemia, unspecified 03/02/2019    Priority: Medium  . Genital herpes 09/12/2016    Priority: Medium  . OSA (obstructive sleep apnea) 05/28/2016    Priority: Medium  . Gout 11/16/2015    Priority: Medium  . Acute duodenal ulcer with hemorrhage 10/12/2014    Priority: Medium  . Hypertension 10/06/2014    Priority: Medium  . Low back pain 03/02/2019    Priority: Low  . BPPV (benign paroxysmal positional vertigo) 09/12/2016    Priority: Low  . Obese 11/17/2015    Priority: Low  . Legally blind in right eye, as defined in [place]     Priority: Low  . EKG abnormality 10/12/2014    Priority: Low  . Esophageal reflux 11/24/2013    Priority: Low  . Atypical chest pain 06/18/2018    Medications- reviewed and updated Current Outpatient Medications  Medication Sig Dispense Refill  . acetaminophen (TYLENOL) 325 MG tablet Take 650 mg by mouth every 6 (six) hours as needed for mild pain, fever or headache.    . ferrous sulfate 325 (65 FE) MG tablet Take 325 mg by mouth daily with breakfast.    . lisinopril-hydrochlorothiazide (ZESTORETIC) 20-12.5 MG tablet Take 1 tablet by mouth daily. 90 tablet 1  . valACYclovir (VALTREX) 500 MG tablet Take 1 tablet (500 mg total) by mouth 2 (two) times daily. For 3 days  with outbreaks (Patient taking differently: Take 500 mg by mouth 2 (two) times daily as needed (Outbreaks). For 3 days) 30 tablet 2   No current facility-administered medications for this visit.      Objective:  BP 102/74 (BP Location: Left Arm, Patient Position: Sitting, Cuff Size: Large)   Pulse 86   Temp 98.5 F (36.9 C) (Temporal)   Ht 5\' 10"  (1.778 m)   Wt 252 lb 3.2 oz (114.4 kg)   SpO2 97%   BMI 36.19 kg/m  Gen: NAD, resting comfortably CV: RRR no murmurs rubs or gallops No chest wall pain noted No pain between scapulas (only hurts with deep breaths and not palpation) Lungs: CTAB no crackles, wheeze, rhonchi Abdomen: soft/nontender/nondistended/normal bowel sounds. No rebound or guarding.  Ext: no edema Skin: warm, dry    Assessment and Plan    # Thoracic Back Pain/chest burning/shortness of breath S: Has been seen previously by Dr. Katrinka BlazingSmith with [facility] for lumbar back pain-XR L-spine 03/02/2019. [name]. Was told iron deficiency was contributing to back pain-he took 45 days of iron but really did not like the taste so stopped-not sure if symptoms improve during this..  Quit taking iron. Slow FE was recommended today.  He reports low back pain seems to be better but now he is having thoracic back pain-pain between bilateral  scapulas-primarily occurs with deep breaths.. Pain in 10 x 10 cm area in upper back between shoulder blades with deep breath When laughts notices wheezing.Diclofenac helps when can get it rubbed on - still has some-does not want to ask his roommates to apply.  Chest pain/shortness of breath issues for 3 to 6 months- preceded COVID-19.  Feels like difficult to take a good deep breath and feels some wheezing in chest.  Feels short of breath easier than normal. He Gained 60 lbs since entering recovery in January. When he is at work and exerting himself will get winded and gets some chest burning- happening for years. Gets palpitations intermittently - may go a week or  two without it. EKG and echocardiogram 06/19/18 - echo with grade II diastolic dysfunction and mildly dilated aortic root. He is worried because of drug use and history of working with chemicals about potential for lung cancer. Feels like voice is always raspy. Feels like phlegm in chest and always thirsty.  Patient was hospitalized about 1-2 months ago for COVID-19-he has recovered well from that.  Had negative d-dimer on several occasions.  Patient previously thought shortness of breath and chest pain issues related to low iron and anemia  Patient also has palpitations at least every few weeks and sometimes more frequently than that for at least a year if not longer.  States this has been a long-term issue.  He has seen cardiology in the past.  Has seen Dr. Sharyn Lull in the distant past and would like to return to him.  He feels like he has some increased fatigue. Denies fever, loss of taste or smell. Reports nasal congestion, wheezing. Dx with Covid 1-2 months ago.  A/P: 43 year old male with history prior drug use (crack and cocaine and states he frequently did not let the copper filter "burn off" before use)/chemical exposure with work presenting with exertional burning chest pain and shortness of breath as well as thoracic back pain-patient is worried about lung cancer.  Has already had negative d-dimer so doubt pulmonary embolism as cause of symptoms since symptoms preceded d-dimer by over a month.  I do believe getting a CT of his chest is reasonable- chest x-rays while hospitalized for COVID-19 was reassuring.  Considered thoracic films but will defer until CT scan completed.  Would get some picture of thoracic spine on that imaging.  Patient agrees to sports medicine follow-up with Dr. Katrinka Blazing if no obvious cause found on CT scan.  Update blood work today as below  Chest pain and shortness of breath issues appear exertional and preceded COVID-19 illness-patient has seen Dr. Sharyn Lull in the past.  He would  like to follow-up with him and referral was placed today.  He can also discuss ongoing issues with palpitations.  Considered GI/GERD related pain but given exertional portion thought starting with cardiology work-up was appropriate for step.  Patient reports he was having shortness of breath last year and echocardiogram on June 19, 2018 was largely reassuring other than grade 2 diastolic dysfunction.  Mild mitral regurgitation  #Gout S: Patient with several gout flareups recently- resolved with prednisone treatment Lab Results  Component Value Date   LABURIC 9.8 (H) 03/02/2019  A/P: Update uric acid level today-suspect we will need to start him on allopurinol to try to get these levels down  #hypertension S: controlled on lisinopril hydrochlorothiazide 20-12.5 mg BP Readings from Last 3 Encounters:  06/16/19 102/74  05/02/19 119/84  04/29/19 (!) 138/92  A/P:  Stable. Continue current  medications.     #Alcoholism/drug abuse S: Patient is living in a recovery house.  He states he is drug and alcohol free since January-congratulated him on his progress.  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   #We will try to check in next visit to make sure has GI follow-up for history hepatitis C-has follow-up with [facility]  Recommended follow up: 6 week follow up Future Appointments  Date Time Provider [place]  07/23/2019  8:40 AM Marin Olp, MD [facility]   Lab/Order associations:   ICD-10-CM   1. Shortness of breath  R06.02 CT  Chest Wo Contrast  2. Chest pain, unspecified type  R07.9 Ambulatory referral to Cardiology  3. Chronic bilateral thoracic back pain  M54.6    G89.29   4. Essential hypertension  I10 Comprehensive metabolic panel  5. 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 Iron deficiency anemia, unspecified iron deficiency anemia type  D50.9 IBC + Ferritin    CBC  7. Alcoholism ([place])  F10.20   8. Cocaine abuse in remission ([place])  F14.11   9. Upper GI bleed  K92.2    Return precautions advised.  Garret Reddish, MD

## 2019-06-16 ENCOUNTER — Encounter: Payer: Self-pay | Admitting: Family Medicine

## 2019-06-16 ENCOUNTER — Ambulatory Visit: Payer: BC Managed Care – PPO | Admitting: Family Medicine

## 2019-06-16 ENCOUNTER — Telehealth: Payer: Self-pay | Admitting: Cardiology

## 2019-06-16 ENCOUNTER — Other Ambulatory Visit: Payer: Self-pay

## 2019-06-16 VITALS — BP 102/74 | HR 86 | Temp 98.5°F | Ht 70.0 in | Wt 252.2 lb

## 2019-06-16 DIAGNOSIS — D509 Iron deficiency anemia, unspecified: Secondary | ICD-10-CM | POA: Diagnosis not present

## 2019-06-16 DIAGNOSIS — E790 Hyperuricemia without signs of inflammatory arthritis and tophaceous disease: Secondary | ICD-10-CM

## 2019-06-16 DIAGNOSIS — M546 Pain in thoracic spine: Secondary | ICD-10-CM | POA: Diagnosis not present

## 2019-06-16 DIAGNOSIS — F1020 Alcohol dependence, uncomplicated: Secondary | ICD-10-CM

## 2019-06-16 DIAGNOSIS — I10 Essential (primary) hypertension: Secondary | ICD-10-CM

## 2019-06-16 DIAGNOSIS — R079 Chest pain, unspecified: Secondary | ICD-10-CM | POA: Diagnosis not present

## 2019-06-16 DIAGNOSIS — F1411 Cocaine abuse, in remission: Secondary | ICD-10-CM

## 2019-06-16 DIAGNOSIS — R0602 Shortness of breath: Secondary | ICD-10-CM | POA: Diagnosis not present

## 2019-06-16 DIAGNOSIS — K922 Gastrointestinal hemorrhage, unspecified: Secondary | ICD-10-CM

## 2019-06-16 DIAGNOSIS — M10471 Other secondary gout, right ankle and foot: Secondary | ICD-10-CM

## 2019-06-16 DIAGNOSIS — G8929 Other chronic pain: Secondary | ICD-10-CM

## 2019-06-16 LAB — CBC
HCT: 43.6 % (ref 39.0–52.0)
Hemoglobin: 14.4 g/dL (ref 13.0–17.0)
MCHC: 32.9 g/dL (ref 30.0–36.0)
MCV: 89.4 fl (ref 78.0–100.0)
Platelets: 223 10*3/uL (ref 150.0–400.0)
RBC: 4.88 Mil/uL (ref 4.22–5.81)
RDW: 15.8 % — ABNORMAL HIGH (ref 11.5–15.5)
WBC: 6.3 10*3/uL (ref 4.0–10.5)

## 2019-06-16 LAB — IBC + FERRITIN
Ferritin: 14.8 ng/mL — ABNORMAL LOW (ref 22.0–322.0)
Iron: 83 ug/dL (ref 42–165)
Saturation Ratios: 22.9 % (ref 20.0–50.0)
Transferrin: 259 mg/dL (ref 212.0–360.0)

## 2019-06-16 LAB — COMPREHENSIVE METABOLIC PANEL
ALT: 17 U/L (ref 0–53)
AST: 13 U/L (ref 0–37)
Albumin: 4.1 g/dL (ref 3.5–5.2)
Alkaline Phosphatase: 44 U/L (ref 39–117)
BUN: 18 mg/dL (ref 6–23)
CO2: 30 mEq/L (ref 19–32)
Calcium: 9.6 mg/dL (ref 8.4–10.5)
Chloride: 105 mEq/L (ref 96–112)
Creatinine, Ser: 1.08 mg/dL (ref 0.40–1.50)
GFR: 74.69 mL/min (ref >60.00)
Glucose, Bld: 80 mg/dL (ref 70–99)
Potassium: 4 mEq/L (ref 3.5–5.1)
Sodium: 142 mEq/L (ref 135–145)
Total Bilirubin: 0.4 mg/dL (ref 0.2–1.2)
Total Protein: 7 g/dL (ref 6.0–8.3)

## 2019-06-16 LAB — URIC ACID: Uric Acid, Serum: 9.3 mg/dL — ABNORMAL HIGH (ref 4.0–7.8)

## 2019-06-16 MED ORDER — ALLOPURINOL 100 MG PO TABS: 100.0000 mg | ORAL_TABLET | Freq: Every day | ORAL | 3 refills | Status: DC

## 2019-06-16 NOTE — Telephone Encounter (Signed)
LVM for patient to call and schedule new patient appt for CP. °

## 2019-06-16 NOTE — Assessment & Plan Note (Signed)
S: Patient is living in a recovery house.  He states he is drug and alcohol free since January-congratulated him on his progress.  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

## 2019-06-16 NOTE — Assessment & Plan Note (Signed)
Suspect prior upper GI bleed was reason for iron deficiency-encouraged patient to take Slow Fe.  May need to start PPI-discussed at 6-week follow-up

## 2019-06-16 NOTE — Addendum Note (Signed)
Addended by: Gwenyth Ober R on: 06/16/2019 04:54 PM   Modules accepted: Orders

## 2019-07-01 ENCOUNTER — Telehealth: Payer: Self-pay | Admitting: Cardiovascular Disease

## 2019-07-01 NOTE — Telephone Encounter (Signed)
LVM for patient to call and schedule new patient appointment with Dr. Berry. °

## 2019-07-14 ENCOUNTER — Other Ambulatory Visit: Payer: Self-pay

## 2019-07-14 ENCOUNTER — Ambulatory Visit (INDEPENDENT_AMBULATORY_CARE_PROVIDER_SITE_OTHER)
Admission: RE | Admit: 2019-07-14 | Discharge: 2019-07-14 | Disposition: A | Discharge status: Home / Self Care | Payer: BC Managed Care – PPO | Source: Ambulatory Visit | Attending: Family Medicine | Admitting: Family Medicine

## 2019-07-14 DIAGNOSIS — R0602 Shortness of breath: Secondary | ICD-10-CM | POA: Diagnosis not present

## 2019-07-14 DIAGNOSIS — R918 Other nonspecific abnormal finding of lung field: Secondary | ICD-10-CM | POA: Diagnosis not present

## 2019-07-14 MED ORDER — IOHEXOL 300 MG/ML  SOLN
75.0000 mL | Freq: Once | INTRAMUSCULAR | Status: AC | PRN
  Administered 2019-07-14: 75 mL via INTRAVENOUS

## 2019-07-16 ENCOUNTER — Other Ambulatory Visit: Payer: Self-pay

## 2019-07-16 DIAGNOSIS — M546 Pain in thoracic spine: Secondary | ICD-10-CM

## 2019-07-21 ENCOUNTER — Other Ambulatory Visit: Payer: Self-pay

## 2019-07-21 ENCOUNTER — Telehealth: Payer: Self-pay

## 2019-07-21 ENCOUNTER — Other Ambulatory Visit: Payer: Self-pay | Admitting: Family Medicine

## 2019-07-21 DIAGNOSIS — R079 Chest pain, unspecified: Secondary | ICD-10-CM

## 2019-07-21 MED ORDER — OMEPRAZOLE 40 MG PO CPDR: 40.0000 mg | DELAYED_RELEASE_CAPSULE | Freq: Every day | ORAL | 1 refills | Status: DC

## 2019-07-21 NOTE — Telephone Encounter (Signed)
Omeprazole sent in   

## 2019-07-21 NOTE — Telephone Encounter (Signed)
If patient is having classic GERD symptoms can send in omeprazole 40mg  daily #30 with 1 refill. If any worsening chest pain, shortness of rbeath, left arm or neck pain, abnormal sweating- should go to ER

## 2019-07-22 ENCOUNTER — Telehealth: Payer: Self-pay

## 2019-07-22 ENCOUNTER — Other Ambulatory Visit: Payer: Self-pay | Admitting: Cardiology

## 2019-07-22 ENCOUNTER — Other Ambulatory Visit (HOSPITAL_COMMUNITY): Payer: Self-pay | Admitting: Cardiology

## 2019-07-22 DIAGNOSIS — R079 Chest pain, unspecified: Secondary | ICD-10-CM

## 2019-07-22 DIAGNOSIS — I10 Essential (primary) hypertension: Secondary | ICD-10-CM | POA: Diagnosis not present

## 2019-07-22 DIAGNOSIS — I209 Angina pectoris, unspecified: Secondary | ICD-10-CM | POA: Diagnosis not present

## 2019-07-22 DIAGNOSIS — R002 Palpitations: Secondary | ICD-10-CM | POA: Diagnosis not present

## 2019-07-22 DIAGNOSIS — E785 Hyperlipidemia, unspecified: Secondary | ICD-10-CM | POA: Diagnosis not present

## 2019-07-22 NOTE — Progress Notes (Signed)
Phone [phone]    Phone [phone]   Subjective:  Virtual visit via Video note. Chief complaint: Chief Complaint  Patient presents with  . Follow-up- shortness of breath, thoracic back pain   This visit type was conducted due to national recommendations for restrictions regarding the COVID-19 Pandemic (e.g. social distancing).  This format is felt to be most appropriate for this patient at this time balancing risks to patient and risks to population by having him in for in person visit.  No physical exam was performed (except for noted visual exam or audio findings with Telehealth visits).    Our team/I connected with Gabriel Garcia at  8:40 AM EDT by a video enabled telemedicine application (doxy.me or caregility through epic) and verified that I am speaking with the correct person using two identifiers.  Location patient: Home-[place] Location provider: [facility], office Persons participating in the virtual visit:  patient  Our team/I discussed the limitations of evaluation and management by telemedicine and the availability of in person appointments. In light of current covid-19 pandemic, patient also understands that we are trying to protect them by minimizing in office contact if at all possible.  The patient expressed consent for telemedicine visit and agreed to proceed. Patient understands insurance will be billed.   ROS-no fever/chills reported.  Continued issues with thoracic back pain.  Continued issues with shortness of breath with exertion.  Past Medical History-  Patient Active Problem List   Diagnosis Date Noted  . COVID-19 virus infection 05/01/2019    Priority: High  . Upper GI bleed 08/31/2018    Priority: High  . Mallory-Weiss tear 12/03/2016    Priority: High  . Cocaine abuse in remission (HCC) 11/16/2015    Priority: High  . Alcoholism (HCC) 11/16/2015    Priority: High  . Hepatitis C     Priority: High  . Hyperlipidemia, unspecified 03/02/2019   Priority: Medium  . Genital herpes 09/12/2016    Priority: Medium  . OSA (obstructive sleep apnea) 05/28/2016    Priority: Medium  . Gout 11/16/2015    Priority: Medium  . Acute duodenal ulcer with hemorrhage 10/12/2014    Priority: Medium  . Hypertension 10/06/2014    Priority: Medium  . Low back pain 03/02/2019    Priority: Low  . BPPV (benign paroxysmal positional vertigo) 09/12/2016    Priority: Low  . Obese 11/17/2015    Priority: Low  . Legally blind in right eye, as defined in [place]     Priority: Low  . EKG abnormality 10/12/2014    Priority: Low  . Esophageal reflux 11/24/2013    Priority: Low  . Atypical chest pain 06/18/2018    Medications- reviewed and updated Current Outpatient Medications  Medication Sig Dispense Refill  . acetaminophen (TYLENOL) 325 MG tablet Take 650 mg by mouth every 6 (six) hours as needed for mild pain, fever or headache.    . allopurinol (ZYLOPRIM) 100 MG tablet Take 1 tablet (100 mg total) by mouth daily. 90 tablet 3  . ferrous sulfate 325 (65 FE) MG tablet Take 325 mg by mouth daily with breakfast.    . lisinopril-hydrochlorothiazide (ZESTORETIC) 20-12.5 MG tablet Take 1 tablet by mouth daily. 90 tablet 1  . omeprazole (PRILOSEC) 40 MG capsule Take 1 capsule (40 mg total) by mouth daily. 30 capsule 1  . valACYclovir (VALTREX) 500 MG tablet Take 1 tablet (500 mg total) by mouth 2 (two) times daily. For 3 days with outbreaks (Patient taking differently: Take 500  mg by mouth 2 (two) times daily as needed (Outbreaks). For 3 days) 30 tablet 2   No current facility-administered medications for this visit.      Objective:  BP 110/79   Temp 98 F (36.7 C)   Ht 5\' 10"  (1.778 m)   Wt 242 lb (109.8 kg)   BMI 34.72 kg/m  self reported vitals Gen: NAD, resting comfortably Lungs: nonlabored, normal respiratory rate  Skin: appears dry, no obvious rash    Assessment and Plan   #Thoracic back pain S: Thoracic back pain continues.  On CT scan  possible cause noted-"Schmorl type superior endplate deformity of the T7 vertebral body, which may be symptomatic."  Patient reports has not heard back from sports medicine yet.  Had seen them earlier in the year for lower back pain and fortunately lower back pain is better. A/P: I wonder if back pain could be related imaging abnormality was above- encouraged patient to call sports medicine if does not hear by me next week or can give [place] a call.  #Dyspnea on exertion S:Patient saw Dr. Terrence Dupont in recent days- there were some skipped beats on EKG and plan was for stress tests . Also changed BP medications (patient cannot recall names).  Patient just finished with a stress test right now-Dr. Terrence Dupont was there for stress test and initially looked pretty good but full results back Monday. He was given nitroglycerin to use if needed.  Patient had reported some chest pain at last visit but states this is a minimal issue at present.  Patient has been concerned about lung cancer-CT scan was completed and negative for any mass.  Very small nodules were noted with suggestion to consider 1 year repeat if high risk- we will discuss next year A/P: Dyspnea on exertion with unclear etiology- lung cancer was ruled out with CT scan.  Has seen cardiology and underwent stress testing-awaiting results  #hypertension S: controlled on unclear regimen-patient was on lisinopril hydrochlorothiazide 20-12.5 mg at last visit but apparently Dr. Terrence Dupont change medications.  Yesterday blood pressure was elevated at 140/110 but looks much better today. BP Readings from Last 3 Encounters:  07/23/19 110/79  07/23/19 115/80  06/16/19 102/74  A/P: Appears controlled-patient may be adjusting any medication given some variation.  Asked him to bring his medications to next visit   Recommended follow up:  3 month follow up advised regardless of above work-up- would like to check in with him and repeat blood pressure as well as make  sure we have correct blood pressure medications on file as above  Lab/Order associations:   ICD-10-CM   1. DOE (dyspnea on exertion)  R06.00   2. Chronic thoracic back pain, unspecified back pain laterality  M54.6    G89.29   3. Essential hypertension  I10    Return precautions advised.  Garret Reddish, MD

## 2019-07-22 NOTE — Patient Instructions (Signed)
There are no preventive care reminders to display for this patient. Depression screen [facility] 2/9 06/16/2019 02/19/2019  Decreased Interest 0 0  Down, Depressed, Hopeless 0 0  PHQ - 2 Score 0 0

## 2019-07-22 NOTE — Telephone Encounter (Signed)
Copied from [phone]. Topic: Appointment Scheduling - Scheduling Inquiry for Clinic >> Jul 22, 2019  3:51 PM Gabriel Garcia wrote: Reason for CRM: pt called in stated that they sch'd stress test at the same time as Dr Yong Channel appt.  He stated needs to go to the stress test appt and then see Dr hunter later tomorrow.  He would like to see if Dr hunter can work him in later tomorrow?  He is ok with Garcia virtual.   Best number [phone]

## 2019-07-22 NOTE — Telephone Encounter (Signed)
Is this ok? If so, What time would you be available for a virtual?

## 2019-07-22 NOTE — Telephone Encounter (Signed)
I am okay with virtual at 1140- if that works for him.  You do not need to reschedule the appointment at that time- simply change the notes to reflect virtual visit and make note visit at 1140- please tell patient I may be running behind at that time but will do my best to be on time

## 2019-07-23 ENCOUNTER — Other Ambulatory Visit: Payer: Self-pay

## 2019-07-23 ENCOUNTER — Encounter (HOSPITAL_COMMUNITY)
Admission: RE | Admit: 2019-07-23 | Discharge: 2019-07-23 | Disposition: A | Discharge status: Home / Self Care | Payer: BC Managed Care – PPO | Source: Ambulatory Visit | Attending: Cardiology | Admitting: Cardiology

## 2019-07-23 ENCOUNTER — Encounter: Payer: Self-pay | Admitting: Family Medicine

## 2019-07-23 ENCOUNTER — Ambulatory Visit (INDEPENDENT_AMBULATORY_CARE_PROVIDER_SITE_OTHER): Payer: BC Managed Care – PPO | Admitting: Family Medicine

## 2019-07-23 VITALS — BP 110/79 | Temp 98.0°F | Ht 70.0 in | Wt 242.0 lb

## 2019-07-23 DIAGNOSIS — R0600 Dyspnea, unspecified: Secondary | ICD-10-CM

## 2019-07-23 DIAGNOSIS — M546 Pain in thoracic spine: Secondary | ICD-10-CM | POA: Diagnosis not present

## 2019-07-23 DIAGNOSIS — I499 Cardiac arrhythmia, unspecified: Secondary | ICD-10-CM | POA: Diagnosis not present

## 2019-07-23 DIAGNOSIS — I10 Essential (primary) hypertension: Secondary | ICD-10-CM | POA: Diagnosis not present

## 2019-07-23 DIAGNOSIS — G8929 Other chronic pain: Secondary | ICD-10-CM

## 2019-07-23 DIAGNOSIS — R0609 Other forms of dyspnea: Secondary | ICD-10-CM

## 2019-07-23 DIAGNOSIS — R0789 Other chest pain: Secondary | ICD-10-CM | POA: Diagnosis not present

## 2019-07-23 DIAGNOSIS — R079 Chest pain, unspecified: Secondary | ICD-10-CM | POA: Insufficient documentation

## 2019-07-23 MED ORDER — REGADENOSON 0.4 MG/5ML IV SOLN
INTRAVENOUS | Status: AC
  Administered 2019-07-23: 0.4 mg via INTRAVENOUS
  Filled 2019-07-23: qty 5

## 2019-07-23 MED ORDER — REGADENOSON 0.4 MG/5ML IV SOLN
0.4000 mg | Freq: Once | INTRAVENOUS | Status: AC
  Administered 2019-07-23: 10:00:00 0.4 mg via INTRAVENOUS

## 2019-07-23 MED ORDER — TECHNETIUM TC 99M TETROFOSMIN IV KIT
30.0000 | PACK | Freq: Once | INTRAVENOUS | Status: AC | PRN
  Administered 2019-07-23: 30 via INTRAVENOUS

## 2019-07-23 MED ORDER — TECHNETIUM TC 99M TETROFOSMIN IV KIT
10.0000 | PACK | Freq: Once | INTRAVENOUS | Status: AC | PRN
  Administered 2019-07-23: 10 via INTRAVENOUS

## 2019-07-23 NOTE — Telephone Encounter (Signed)
Called pt and pt agreed to the 11:40 virtual call today.

## 2019-07-29 ENCOUNTER — Telehealth: Payer: Self-pay | Admitting: Family Medicine

## 2019-07-29 DIAGNOSIS — I208 Other forms of angina pectoris: Secondary | ICD-10-CM | POA: Diagnosis not present

## 2019-07-29 DIAGNOSIS — I10 Essential (primary) hypertension: Secondary | ICD-10-CM | POA: Diagnosis not present

## 2019-07-29 DIAGNOSIS — E785 Hyperlipidemia, unspecified: Secondary | ICD-10-CM | POA: Diagnosis not present

## 2019-07-29 DIAGNOSIS — R9439 Abnormal result of other cardiovascular function study: Secondary | ICD-10-CM | POA: Diagnosis not present

## 2019-07-29 NOTE — Telephone Encounter (Signed)
Relation to pt: self  Call back number: [phone]   Reason for call:  Cardiologist would like to place a stent in would like to prescribe plavix and bare Aspirin seeking PCP advice

## 2019-07-29 NOTE — Telephone Encounter (Signed)
See note

## 2019-08-02 NOTE — Telephone Encounter (Signed)
See below

## 2019-08-02 NOTE — Telephone Encounter (Signed)
Called pt and made him aware of below.

## 2019-08-02 NOTE — Telephone Encounter (Signed)
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  GI bleeding risk is a big concern-may want to get input from [facility].  He was not anemic on last check in September fortunately

## 2019-08-09 DIAGNOSIS — E785 Hyperlipidemia, unspecified: Secondary | ICD-10-CM | POA: Diagnosis not present

## 2019-08-09 DIAGNOSIS — I10 Essential (primary) hypertension: Secondary | ICD-10-CM | POA: Diagnosis not present

## 2019-08-14 ENCOUNTER — Other Ambulatory Visit (HOSPITAL_COMMUNITY)
Admission: RE | Admit: 2019-08-14 | Discharge: 2019-08-14 | Disposition: A | Discharge status: Home / Self Care | Payer: BC Managed Care – PPO | Source: Ambulatory Visit | Attending: Cardiology | Admitting: Cardiology

## 2019-08-14 DIAGNOSIS — Z20828 Contact with and (suspected) exposure to other viral communicable diseases: Secondary | ICD-10-CM | POA: Insufficient documentation

## 2019-08-14 DIAGNOSIS — Z01812 Encounter for preprocedural laboratory examination: Secondary | ICD-10-CM | POA: Diagnosis not present

## 2019-08-14 LAB — SARS CORONAVIRUS 2 (TAT 6-24 HRS): SARS Coronavirus 2: NEGATIVE

## 2019-08-17 ENCOUNTER — Other Ambulatory Visit: Payer: Self-pay

## 2019-08-17 ENCOUNTER — Ambulatory Visit (HOSPITAL_COMMUNITY)
Admission: RE | Admit: 2019-08-17 | Discharge: 2019-08-17 | Disposition: A | Discharge status: Home / Self Care | Payer: BC Managed Care – PPO | Attending: Cardiology | Admitting: Cardiology

## 2019-08-17 ENCOUNTER — Other Ambulatory Visit: Payer: Self-pay | Admitting: Family Medicine

## 2019-08-17 ENCOUNTER — Encounter (HOSPITAL_COMMUNITY)
Admission: RE | Disposition: A | Discharge status: Home / Self Care | Payer: Self-pay | Source: Home / Self Care | Attending: Cardiology

## 2019-08-17 DIAGNOSIS — E785 Hyperlipidemia, unspecified: Secondary | ICD-10-CM | POA: Insufficient documentation

## 2019-08-17 DIAGNOSIS — Z7982 Long term (current) use of aspirin: Secondary | ICD-10-CM | POA: Insufficient documentation

## 2019-08-17 DIAGNOSIS — Z79899 Other long term (current) drug therapy: Secondary | ICD-10-CM | POA: Insufficient documentation

## 2019-08-17 DIAGNOSIS — G4733 Obstructive sleep apnea (adult) (pediatric): Secondary | ICD-10-CM | POA: Diagnosis not present

## 2019-08-17 DIAGNOSIS — E669 Obesity, unspecified: Secondary | ICD-10-CM | POA: Insufficient documentation

## 2019-08-17 DIAGNOSIS — R9439 Abnormal result of other cardiovascular function study: Secondary | ICD-10-CM | POA: Insufficient documentation

## 2019-08-17 DIAGNOSIS — Z6834 Body mass index (BMI) 34.0-34.9, adult: Secondary | ICD-10-CM | POA: Insufficient documentation

## 2019-08-17 DIAGNOSIS — M109 Gout, unspecified: Secondary | ICD-10-CM | POA: Insufficient documentation

## 2019-08-17 DIAGNOSIS — I209 Angina pectoris, unspecified: Secondary | ICD-10-CM | POA: Diagnosis not present

## 2019-08-17 DIAGNOSIS — I10 Essential (primary) hypertension: Secondary | ICD-10-CM | POA: Diagnosis not present

## 2019-08-17 HISTORY — PX: LEFT HEART CATH AND CORONARY ANGIOGRAPHY: CATH118249

## 2019-08-17 SURGERY — LEFT HEART CATH AND CORONARY ANGIOGRAPHY
Anesthesia: LOCAL

## 2019-08-17 MED ORDER — ACETAMINOPHEN 325 MG PO TABS: 650.0000 mg | ORAL_TABLET | ORAL | Status: DC | PRN

## 2019-08-17 MED ORDER — LIDOCAINE HCL (PF) 1 % IJ SOLN
INTRAMUSCULAR | Status: AC
  Filled 2019-08-17: qty 30

## 2019-08-17 MED ORDER — MIDAZOLAM HCL 2 MG/2ML IJ SOLN
INTRAMUSCULAR | Status: DC | PRN
  Administered 2019-08-17 (×2): 1 mg via INTRAVENOUS

## 2019-08-17 MED ORDER — OXYCODONE HCL 5 MG PO TABS: 5.0000 mg | ORAL_TABLET | ORAL | Status: DC | PRN

## 2019-08-17 MED ORDER — ASPIRIN 81 MG PO CHEW: 81.0000 mg | CHEWABLE_TABLET | ORAL | Status: DC

## 2019-08-17 MED ORDER — HEPARIN (PORCINE) IN NACL 1000-0.9 UT/500ML-% IV SOLN
INTRAVENOUS | Status: AC
  Filled 2019-08-17: qty 500

## 2019-08-17 MED ORDER — CLOPIDOGREL BISULFATE 75 MG PO TABS
75.0000 mg | ORAL_TABLET | ORAL | Status: AC
  Administered 2019-08-17: 75 mg via ORAL
  Filled 2019-08-17: qty 1

## 2019-08-17 MED ORDER — ASPIRIN 81 MG PO CHEW
CHEWABLE_TABLET | ORAL | Status: AC
  Administered 2019-08-17: 81 mg via ORAL
  Filled 2019-08-17: qty 1

## 2019-08-17 MED ORDER — SODIUM CHLORIDE 0.9 % IV SOLN: 250.0000 mL | INTRAVENOUS | Status: DC | PRN

## 2019-08-17 MED ORDER — HEPARIN (PORCINE) IN NACL 1000-0.9 UT/500ML-% IV SOLN
INTRAVENOUS | Status: DC | PRN
  Administered 2019-08-17 (×2): 500 mL

## 2019-08-17 MED ORDER — SODIUM CHLORIDE 0.9 % WEIGHT BASED INFUSION: 1.0000 mL/kg/h | INTRAVENOUS | Status: DC

## 2019-08-17 MED ORDER — LIDOCAINE HCL (PF) 1 % IJ SOLN
INTRAMUSCULAR | Status: DC | PRN
  Administered 2019-08-17: 10 mL

## 2019-08-17 MED ORDER — CLOPIDOGREL BISULFATE 75 MG PO TABS: 75.0000 mg | ORAL_TABLET | ORAL | Status: DC

## 2019-08-17 MED ORDER — ONDANSETRON HCL 4 MG/2ML IJ SOLN: 4.0000 mg | Freq: Four times a day (QID) | INTRAMUSCULAR | Status: DC | PRN

## 2019-08-17 MED ORDER — ASPIRIN 81 MG PO CHEW
81.0000 mg | CHEWABLE_TABLET | ORAL | Status: AC
  Administered 2019-08-17: 07:00:00 81 mg via ORAL

## 2019-08-17 MED ORDER — SODIUM CHLORIDE 0.9 % WEIGHT BASED INFUSION
3.0000 mL/kg/h | INTRAVENOUS | Status: DC
  Administered 2019-08-17: 07:00:00 3 mL/kg/h via INTRAVENOUS

## 2019-08-17 MED ORDER — SODIUM CHLORIDE 0.9 % IV SOLN: INTRAVENOUS | Status: DC

## 2019-08-17 MED ORDER — SODIUM CHLORIDE 0.9% FLUSH: 3.0000 mL | INTRAVENOUS | Status: DC | PRN

## 2019-08-17 MED ORDER — FENTANYL CITRATE (PF) 100 MCG/2ML IJ SOLN
INTRAMUSCULAR | Status: DC | PRN
  Administered 2019-08-17 (×2): 25 ug via INTRAVENOUS

## 2019-08-17 MED ORDER — IOHEXOL 350 MG/ML SOLN
INTRAVENOUS | Status: DC | PRN
  Administered 2019-08-17: 80 mL

## 2019-08-17 MED ORDER — SODIUM CHLORIDE 0.9% FLUSH: 3.0000 mL | Freq: Two times a day (BID) | INTRAVENOUS | Status: DC

## 2019-08-17 MED ORDER — FENTANYL CITRATE (PF) 100 MCG/2ML IJ SOLN
INTRAMUSCULAR | Status: AC
  Filled 2019-08-17: qty 2

## 2019-08-17 MED ORDER — MIDAZOLAM HCL 2 MG/2ML IJ SOLN
INTRAMUSCULAR | Status: AC
  Filled 2019-08-17: qty 2

## 2019-08-17 SURGICAL SUPPLY — 8 items
CATH DXT MULTI JL4 JR4 ANG PIG (CATHETERS) ×1 IMPLANT
CATH INFINITI 5FR JL5 (CATHETERS) ×1 IMPLANT
KIT HEART LEFT (KITS) ×2 IMPLANT
PACK CARDIAC CATHETERIZATION (CUSTOM PROCEDURE TRAY) ×2 IMPLANT
SHEATH PINNACLE 5F 10CM (SHEATH) ×1 IMPLANT
SYR MEDRAD MARK 7 150ML (SYRINGE) ×2 IMPLANT
TRANSDUCER W/STOPCOCK (MISCELLANEOUS) ×2 IMPLANT
WIRE EMERALD 3MM-J .035X150CM (WIRE) ×1 IMPLANT

## 2019-08-17 NOTE — Interval H&P Note (Signed)
Cath Lab Visit (complete for each Cath Lab visit)  Clinical Evaluation Leading to the Procedure:   ACS: No.  Non-ACS:    Anginal Classification: CCS III  Anti-ischemic medical therapy: Maximal Therapy (2 or more classes of medications)  Non-Invasive Test Results: Intermediate-risk stress test findings: cardiac mortality 1-3%/year  Prior CABG: No previous CABG      History and Physical Interval Note:  08/17/2019 7:19 AM  Gabriel Garcia  has presented today for surgery, with the diagnosis of abnormal stress test.  The various methods of treatment have been discussed with the patient and family. After consideration of risks, benefits and other options for treatment, the patient has consented to  Procedure(s): LEFT HEART CATH AND CORONARY ANGIOGRAPHY (N/A) as a surgical intervention.  The patient's history has been reviewed, patient examined, no change in status, stable for surgery.  I have reviewed the patient's chart and labs.  Questions were answered to the patient's satisfaction.     Charolette Forward

## 2019-08-17 NOTE — Progress Notes (Signed)
Site area: Right groin a 5 french arterial sheath was removed  Site Prior to Removal:  Level 0  Pressure Applied For 20 MINUTES    Bedrest Beginning at 0830am  Manual:   Yes.    Patient Status During Pull:  stable  Post Pull Groin Site:  Level 0  Post Pull Instructions Given:  Yes.    Post Pull Pulses Present:  Yes.    Dressing Applied:  Yes.    Comments:

## 2019-08-17 NOTE — H&P (Signed)
Printed H&P in the chart needs to be scanned 

## 2019-08-17 NOTE — Discharge Instructions (Signed)
Femoral Site Care This sheet gives you information about how to care for yourself after your procedure. Your health care provider may also give you more specific instructions. If you have problems or questions, contact your health care provider. What can I expect after the procedure? After the procedure, it is common to have:  Bruising that usually fades within 1-2 weeks.  Tenderness at the site. Follow these instructions at home: Wound care  Follow instructions from your health care provider about how to take care of your insertion site. Make sure you: ? Wash your hands with soap and water before you change your bandage (dressing). If soap and water are not available, use hand sanitizer. ? Change your dressing as told by your health care provider.  Do not take baths, swim, or use a hot tub for 5 days.  You may shower 24-48 hours after the procedure. ? Gently wash the site with plain soap and water. ? Pat the area dry with a clean towel. ? Do not rub the site. This may cause bleeding.  Do not apply powder or lotion to the site. Keep the site clean and dry.  Check your femoral site every day for signs of infection. Check for: ? Redness, swelling, or pain. ? Fluid or blood. ? Warmth. ? Pus or a bad smell. Activity  For the first 2-3 days after your procedure, or as long as directed: ? Avoid climbing stairs as much as possible. ? Do not squat.  Do not lift anything that is heavier than 10 lb for 5 days.  Rest as directed. ? Avoid sitting for a long time without moving. Get up to take short walks every 1-2 hours.  Do not drive for 24 hours if you were given a medicine to help you relax (sedative). General instructions  Take over-the-counter and prescription medicines only as told by your health care provider.  Keep all follow-up visits as told by your health care provider. This is important. Contact a health care provider if you have:  A fever or chills.  You have  redness, swelling, or pain around your insertion site. Get help right away if:  The catheter insertion area swells very fast.  You pass out.  You suddenly start to sweat or your skin gets clammy.  The catheter insertion area is bleeding, and the bleeding does not stop when you hold steady pressure on the area.  The area near or just beyond the catheter insertion site becomes pale, cool, tingly, or numb. These symptoms may represent a serious problem that is an emergency. Do not wait to see if the symptoms will go away. Get medical help right away. Call your local emergency services (911 in the [place] Do not drive yourself to the hospital. Summary  After the procedure, it is common to have bruising that usually fades within 1-2 weeks.  Check your femoral site every day for signs of infection.  Do not lift anything that is heavier than 10 lb for 5 days.  This information is not intended to replace advice given to you by your health care provider. Make sure you discuss any questions you have with your health care provider. Document Released: 06/03/2014 Document Revised: 10/13/2017 Document Reviewed: 10/13/2017 Elsevier Patient Education  2020 [employer].   Coronary Angiogram A coronary angiogram is an X-ray procedure that is used to examine the arteries in the heart. In this procedure, a dye (contrast dye) is injected through a long, thin tube (catheter). The catheter  is inserted through the groin, wrist, or arm. The dye is injected into each artery, then X-rays are taken to show if there is a blockage in the arteries of the heart. This procedure can also show if you have valve disease or a disease of the aorta, and it can be used to check the overall function of your heart muscle. You may have a coronary angiogram if:  You are having chest pain, or other symptoms of angina, and you are at risk for heart disease.  You have an abnormal electrocardiogram (ECG) or stress test.  You have  chest pain and heart failure.  You are having irregular heart rhythms.  You and your health care provider determine that the benefits of the test information outweigh the risks of the procedure. Let your health care provider know about:  Any allergies you have, including allergies to contrast dye.  All medicines you are taking, including vitamins, herbs, eye drops, creams, and over-the-counter medicines.  Any problems you or family members have had with anesthetic medicines.  Any blood disorders you have.  Any surgeries you have had.  History of kidney problems or kidney failure.  Any medical conditions you have.  Whether you are pregnant or may be pregnant. What are the risks? Generally, this is a safe procedure. However, problems may occur, including:  Infection.  Allergic reaction to medicines or dyes that are used.  Bleeding from the access site or other locations.  Kidney injury, especially in people with impaired kidney function.  Stroke (rare).  Heart attack (rare).  Damage to other structures or organs. What happens before the procedure? Staying hydrated Follow instructions from your health care provider about hydration, which may include:  Up to 2 hours before the procedure - you may continue to drink clear liquids, such as water, clear fruit juice, black coffee, and plain tea. Eating and drinking restrictions Follow instructions from your health care provider about eating and drinking, which may include:  8 hours before the procedure - stop eating heavy meals or foods such as meat, fried foods, or fatty foods.  6 hours before the procedure - stop eating light meals or foods, such as toast or cereal.  2 hours before the procedure - stop drinking clear liquids. General instructions  Ask your health care provider about: ? Changing or stopping your regular medicines. This is especially important if you are taking diabetes medicines or blood  thinners. ? Taking medicines such as ibuprofen. These medicines can thin your blood. Do not take these medicines before your procedure if your health care provider instructs you not to, though aspirin may be recommended prior to coronary angiograms.  Plan to have someone take you home from the hospital or clinic.  You may need to have blood tests or X-rays done. What happens during the procedure?  An IV tube will be inserted into one of your veins.  You will be given one or more of the following: ? A medicine to help you relax (sedative). ? A medicine to numb the area where the catheter will be inserted into an artery (local anesthetic).  To reduce your risk of infection: ? Your health care team will wash or sanitize their hands. ? Your skin will be washed with soap. ? Hair may be removed from the area where the catheter will be inserted.  You will be connected to a continuous ECG monitor.  The catheter will be inserted into an artery. The location may be in your groin,  in your wrist, or in the fold of your arm (near your elbow).  A type of X-ray (fluoroscopy) will be used to help guide the catheter to the opening of the blood vessel that is being examined.  A dye will be injected into the catheter, and X-rays will be taken. The dye will help to show where any narrowing or blockages are located in the heart arteries.  Tell your health care provider if you have any chest pain or trouble breathing during the procedure.  If blockages are found, your health care provider may perform another procedure, such as inserting a coronary stent. The procedure may vary among health care providers and hospitals. What happens after the procedure?  After the procedure, you will need to keep the area still for a few hours, or for as long as told by your health care provider. If the procedure is done through the groin, you will be instructed to not bend and not cross your legs.  The insertion site  will be checked frequently.  The pulse in your foot or wrist will be checked frequently.  You may have additional blood tests, X-rays, and a test that records the electrical activity of your heart (ECG).  Do not drive for 24 hours if you were given a sedative. Summary  A coronary angiogram is an X-ray procedure that is used to look into the arteries in the heart.  During the procedure, a dye (contrast dye) is injected through a long, thin tube (catheter). The catheter is inserted through the groin, wrist, or arm.  Tell your health care provider about any allergies you have, including allergies to contrast dye.  After the procedure, you will need to keep the area still for a few hours, or for as long as told by your health care provider. This information is not intended to replace advice given to you by your health care provider. Make sure you discuss any questions you have with your health care provider. Document Released: 04/06/2003 Document Revised: 09/12/2017 Document Reviewed: 07/12/2016 Elsevier Patient Education  2020 [employer]

## 2019-08-18 ENCOUNTER — Encounter (HOSPITAL_COMMUNITY): Payer: Self-pay | Admitting: Cardiology

## 2019-08-30 ENCOUNTER — Other Ambulatory Visit: Payer: Self-pay | Admitting: Family Medicine

## 2019-09-03 DIAGNOSIS — E785 Hyperlipidemia, unspecified: Secondary | ICD-10-CM | POA: Diagnosis not present

## 2019-09-03 DIAGNOSIS — I2510 Atherosclerotic heart disease of native coronary artery without angina pectoris: Secondary | ICD-10-CM | POA: Diagnosis not present

## 2019-09-03 DIAGNOSIS — G4733 Obstructive sleep apnea (adult) (pediatric): Secondary | ICD-10-CM | POA: Diagnosis not present

## 2019-09-03 DIAGNOSIS — I10 Essential (primary) hypertension: Secondary | ICD-10-CM | POA: Diagnosis not present

## 2019-10-04 ENCOUNTER — Other Ambulatory Visit: Payer: Self-pay | Admitting: Family Medicine

## 2019-10-04 DIAGNOSIS — A6000 Herpesviral infection of urogenital system, unspecified: Secondary | ICD-10-CM

## 2019-10-21 ENCOUNTER — Other Ambulatory Visit: Payer: Self-pay | Admitting: Family Medicine

## 2019-10-21 DIAGNOSIS — A6000 Herpesviral infection of urogenital system, unspecified: Secondary | ICD-10-CM

## 2020-03-02 ENCOUNTER — Emergency Department (HOSPITAL_COMMUNITY)
Admission: EM | Admit: 2020-03-02 | Discharge: 2020-03-02 | Disposition: A | Discharge status: Home / Self Care | Payer: 59 | Attending: Emergency Medicine | Admitting: Emergency Medicine

## 2020-03-02 ENCOUNTER — Emergency Department (HOSPITAL_COMMUNITY): Payer: 59

## 2020-03-02 ENCOUNTER — Other Ambulatory Visit: Payer: Self-pay

## 2020-03-02 ENCOUNTER — Encounter (HOSPITAL_COMMUNITY): Payer: Self-pay | Admitting: Emergency Medicine

## 2020-03-02 DIAGNOSIS — Z8616 Personal history of COVID-19: Secondary | ICD-10-CM | POA: Insufficient documentation

## 2020-03-02 DIAGNOSIS — I10 Essential (primary) hypertension: Secondary | ICD-10-CM | POA: Insufficient documentation

## 2020-03-02 DIAGNOSIS — Z79899 Other long term (current) drug therapy: Secondary | ICD-10-CM | POA: Diagnosis not present

## 2020-03-02 DIAGNOSIS — R1030 Lower abdominal pain, unspecified: Secondary | ICD-10-CM | POA: Diagnosis present

## 2020-03-02 DIAGNOSIS — N132 Hydronephrosis with renal and ureteral calculous obstruction: Secondary | ICD-10-CM | POA: Insufficient documentation

## 2020-03-02 DIAGNOSIS — N200 Calculus of kidney: Secondary | ICD-10-CM | POA: Diagnosis not present

## 2020-03-02 LAB — COMPREHENSIVE METABOLIC PANEL
ALT: 44 U/L (ref 0–44)
AST: 31 U/L (ref 15–41)
Albumin: 4.7 g/dL (ref 3.5–5.0)
Alkaline Phosphatase: 63 U/L (ref 38–126)
Anion gap: 13 (ref 5–15)
BUN: 12 mg/dL (ref 6–20)
CO2: 20 mmol/L — ABNORMAL LOW (ref 22–32)
Calcium: 9.8 mg/dL (ref 8.9–10.3)
Chloride: 104 mmol/L (ref 98–111)
Creatinine, Ser: 1.43 mg/dL — ABNORMAL HIGH (ref 0.61–1.24)
GFR calc Af Amer: >60 mL/min (ref >60)
GFR calc non Af Amer: 60 mL/min — ABNORMAL LOW (ref >60)
Glucose, Bld: 143 mg/dL — ABNORMAL HIGH (ref 70–99)
Potassium: 4.2 mmol/L (ref 3.5–5.1)
Sodium: 137 mmol/L (ref 135–145)
Total Bilirubin: 0.7 mg/dL (ref 0.3–1.2)
Total Protein: 8 g/dL (ref 6.5–8.1)

## 2020-03-02 LAB — URINALYSIS, ROUTINE W REFLEX MICROSCOPIC
Bacteria, UA: NONE SEEN
Bilirubin Urine: NEGATIVE
Glucose, UA: 50 mg/dL — AB
Ketones, ur: 20 mg/dL — AB
Leukocytes,Ua: NEGATIVE
Nitrite: NEGATIVE
Protein, ur: NEGATIVE mg/dL
Specific Gravity, Urine: 1.028 (ref 1.005–1.030)
pH: 6 (ref 5.0–8.0)

## 2020-03-02 LAB — CBC
HCT: 45.5 % (ref 39.0–52.0)
Hemoglobin: 15.4 g/dL (ref 13.0–17.0)
MCH: 30 pg (ref 26.0–34.0)
MCHC: 33.8 g/dL (ref 30.0–36.0)
MCV: 88.7 fL (ref 80.0–100.0)
Platelets: 324 10*3/uL (ref 150–400)
RBC: 5.13 MIL/uL (ref 4.22–5.81)
RDW: 13.4 % (ref 11.5–15.5)
WBC: 16.3 10*3/uL — ABNORMAL HIGH (ref 4.0–10.5)
nRBC: 0 % (ref 0.0–0.2)

## 2020-03-02 LAB — LIPASE, BLOOD: Lipase: 18 U/L (ref 11–51)

## 2020-03-02 MED ORDER — IOHEXOL 300 MG/ML  SOLN
100.0000 mL | Freq: Once | INTRAMUSCULAR | Status: AC | PRN
  Administered 2020-03-02: 100 mL via INTRAVENOUS

## 2020-03-02 MED ORDER — ONDANSETRON 4 MG PO TBDP: 4.0000 mg | ORAL_TABLET | Freq: Three times a day (TID) | ORAL | 0 refills | Status: DC | PRN

## 2020-03-02 MED ORDER — HYDROCODONE-ACETAMINOPHEN 5-325 MG PO TABS: 1.0000 | ORAL_TABLET | Freq: Four times a day (QID) | ORAL | 0 refills | Status: DC | PRN

## 2020-03-02 MED ORDER — KETOROLAC TROMETHAMINE 30 MG/ML IJ SOLN
30.0000 mg | Freq: Once | INTRAMUSCULAR | Status: AC
  Administered 2020-03-02: 30 mg via INTRAVENOUS
  Filled 2020-03-02: qty 1

## 2020-03-02 MED ORDER — SODIUM CHLORIDE 0.9 % IV BOLUS
1000.0000 mL | Freq: Once | INTRAVENOUS | Status: AC
  Administered 2020-03-02: 1000 mL via INTRAVENOUS

## 2020-03-02 MED ORDER — SODIUM CHLORIDE (PF) 0.9 % IJ SOLN
INTRAMUSCULAR | Status: AC
  Filled 2020-03-02: qty 50

## 2020-03-02 MED ORDER — HYDROMORPHONE HCL 1 MG/ML IJ SOLN
1.0000 mg | Freq: Once | INTRAMUSCULAR | Status: AC
  Administered 2020-03-02: 1 mg via INTRAVENOUS
  Filled 2020-03-02: qty 1

## 2020-03-02 MED ORDER — SODIUM CHLORIDE 0.9% FLUSH: 3.0000 mL | Freq: Once | INTRAVENOUS | Status: DC

## 2020-03-02 MED ORDER — ONDANSETRON HCL 4 MG/2ML IJ SOLN
4.0000 mg | Freq: Once | INTRAMUSCULAR | Status: AC
  Administered 2020-03-02: 4 mg via INTRAVENOUS
  Filled 2020-03-02: qty 2

## 2020-03-02 MED ORDER — MORPHINE SULFATE (PF) 4 MG/ML IV SOLN
4.0000 mg | Freq: Once | INTRAVENOUS | Status: AC
  Administered 2020-03-02: 4 mg via INTRAVENOUS
  Filled 2020-03-02: qty 1

## 2020-03-02 NOTE — ED Provider Notes (Signed)
[facility]-EMERGENCY DEPT Provider Note   CSN: [id number] Arrival date & time: 03/02/20  1658     History Chief Complaint  Patient presents with  . Abdominal Pain  . Emesis    Genevie CheshireBilly Marlowe ShoresJason Shaler is a 44 y.o. male with past medical history significant for hepatitis C, substance abuse, legally blind in right eye who presents for evaluation of abdominal pain.  Patient states he developed lower abdominal pain approximately 1 PM today.  Located diffusely to lower abdomen.  He is unsure if this radiates.  Last bowel movement yesterday.  No urinary complaints.  No prior history of stones.  2 episode of NBNB emesis.  Has not taken anything for pain.  Rates his pain a 10/10.  Prior abdominal surgeries.  Denies fever, chills, chest pain, shortness of breath, testicular pain, testicular swelling, paresthesias, pallor to lower extremities or extremity pain.  Denies additional aggravating or relieving factors.   History pain from patient and past medical records.  No interpreter used.  HPI     Past Medical History:  Diagnosis Date  . Acute duodenal ulcer with hemorrhage 10/12/2014  . GERD (gastroesophageal reflux disease)   . GI bleed   . Hepatitis C   . Hypertension   . 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 Legally blind in right eye, as defined in [place]   . Mesenteric adenitis   . Sliding hiatal hernia   . Substance abuse (HCC)    cocaine  . 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 COVID-19 virus infection 05/01/2019  . Low back pain 03/02/2019  . Hyperlipidemia, unspecified 03/02/2019  . Upper GI bleed 08/31/2018  . Atypical chest pain 06/18/2018  . Mallory-Weiss tear 12/03/2016  . BPPV (benign paroxysmal positional vertigo) 09/12/2016  . Genital herpes 09/12/2016  . OSA (obstructive sleep apnea) 05/28/2016  . Obese 11/17/2015  . Cocaine abuse in remission (HCC) 11/16/2015  . Alcoholism (HCC)  11/16/2015  . Gout 11/16/2015  . Legally blind in right eye, as defined in [place]   . Acute duodenal ulcer with hemorrhage 10/12/2014  . EKG abnormality 10/12/2014  . Hypertension 10/06/2014  . Esophageal reflux 11/24/2013  . Hepatitis C     Past Surgical History:  Procedure Laterality Date  . BIOPSY  09/08/2018   Procedure: BIOPSY;  Surgeon: Bernette RedbirdBuccini, Robert, MD;  Location: [facility] ENDOSCOPY;  Service: Endoscopy;;  . ESOPHAGOGASTRODUODENOSCOPY N/A 10/13/2014   Procedure: ESOPHAGOGASTRODUODENOSCOPY (EGD);  Surgeon: Iva Booparl E Gessner, MD;  [facility];  Service: Endoscopy;  Laterality: N/A;  . ESOPHAGOGASTRODUODENOSCOPY N/A 11/17/2016   Procedure: ESOPHAGOGASTRODUODENOSCOPY (EGD);  Surgeon: Charlott RakesVincent Schooler, MD;  [facility];  Service: Endoscopy;  Laterality: N/A;  . ESOPHAGOGASTRODUODENOSCOPY (EGD) WITH PROPOFOL N/A 09/01/2018   Procedure: ESOPHAGOGASTRODUODENOSCOPY (EGD) WITH PROPOFOL;  Surgeon: Carman ChingEdwards, James, MD;  Location: [facility]OS[facility];  Service: Endoscopy;  Laterality: N/A;  . ESOPHAGOGASTRODUODENOSCOPY (EGD) WITH PROPOFOL N/A 09/08/2018   Procedure: ESOPHAGOGASTRODUODENOSCOPY (EGD) WITH PROPOFOL;  Surgeon: Bernette RedbirdBuccini, Robert, MD;  Location: [facility]OS[facility];  Service: Endoscopy;  Laterality: N/A;  . EYE SURGERY Right   . FOOT SURGERY Right   . LEFT HEART CATH AND CORONARY ANGIOGRAPHY N/A 08/17/2019   Procedure: LEFT HEART CATH AND CORONARY ANGIOGRAPHY;  Surgeon: Rinaldo CloudHarwani, Mohan, MD;  Location: [facility] INVASIVE CV LAB;  Service: Cardiovascular;  Laterality: N/A;  . LIVER BIOPSY     Dr. Kinnie ScalesMedoff GI       Family  History  Problem Relation Age of Onset  . Rheum arthritis Mother   . Hypertension Mother   . Alcoholism Mother   . Heart failure Father        rheumatic heart disease, also drinking and smoking  . Alcoholism Father        died age 51  . Colon cancer Neg Hx     Social History   Tobacco Use  . Smoking status: Never Smoker  . Smokeless tobacco: Never Used  Substance  Use Topics  . Alcohol use: No    Comment: etoh free since 08/2015.  . Drug use: No    Comment: used cocaine before but has been clean since 08/2015.     Home Medications Prior to Admission medications   Medication Sig Start Date End Date Taking? Authorizing Provider  allopurinol (ZYLOPRIM) 100 MG tablet Take 1 tablet (100 mg total) by mouth daily. 06/16/19  Yes Marin Olp, MD  amLODipine (NORVASC) 5 MG tablet Take 5 mg by mouth daily. 07/29/19  Yes [provider]  atorvastatin (LIPITOR) 20 MG tablet Take 20 mg by mouth daily. 07/29/19  Yes [provider]  losartan (COZAAR) 50 MG tablet Take 50 mg by mouth daily. 07/22/19  Yes [provider]  nitroGLYCERIN (NITROSTAT) 0.4 MG SL tablet Place 0.4 mg under the tongue every 5 (five) minutes x 3 doses as needed for chest pain. 07/22/19  Yes [provider]  omeprazole (PRILOSEC) 40 MG capsule TAKE 1 CAPSULE BY MOUTH EVERY DAY 08/31/19  Yes Marin Olp, MD  HYDROcodone-acetaminophen (NORCO/VICODIN) 5-325 MG tablet Take 1-2 tablets by mouth every 6 (six) hours as needed. 03/02/20   Hannia Matchett A, PA-C  ondansetron (ZOFRAN ODT) 4 MG disintegrating tablet Take 1 tablet (4 mg total) by mouth every 8 (eight) hours as needed for nausea or vomiting. 03/02/20   Makenleigh Crownover A, PA-C  valACYclovir (VALTREX) 500 MG tablet TAKE 1 TABLET (500 MG TOTAL) BY MOUTH 2 (TWO) TIMES DAILY. FOR 3 DAYS WITH OUTBREAKS Patient not taking: Reported on 03/02/2020 10/21/19   Marin Olp, MD    Allergies    Patient has no known allergies.  Review of Systems   Review of Systems  Constitutional: Negative.   HENT: Negative.   Eyes: Negative.   Respiratory: Negative.   Cardiovascular: Negative.   Gastrointestinal: Positive for abdominal pain, nausea and vomiting. Negative for abdominal distention, anal bleeding, blood in stool, constipation, diarrhea and rectal pain.  Genitourinary: Negative.   Musculoskeletal:  Negative.   Skin: Negative.   Neurological: Negative.   All other systems reviewed and are negative.   Physical Exam Updated Vital Signs BP (!) 158/123   Pulse 88   Temp (!) 97.5 F (36.4 C) (Oral)   Resp 16   SpO2 99%   Physical Exam Vitals and nursing note reviewed.  Constitutional:      General: He is not in acute distress.    Appearance: He is well-developed. He is not ill-appearing, toxic-appearing or diaphoretic.  HENT:     Head: Normocephalic and atraumatic.     Mouth/Throat:     Mouth: Mucous membranes are moist.  Eyes:     Pupils: Pupils are equal, round, and reactive to light.     Comments: Cloudy right eye, legally blind  Cardiovascular:     Rate and Rhythm: Normal rate and regular rhythm.     Pulses:          Radial pulses are 2+ on  the right side and 2+ on the left side.       Dorsalis pedis pulses are 2+ on the right side and 2+ on the left side.       Posterior tibial pulses are 2+ on the right side and 2+ on the left side.     Heart sounds: Normal heart sounds.  Pulmonary:     Effort: Pulmonary effort is normal. No respiratory distress.     Breath sounds: Normal breath sounds.  Abdominal:     General: There is no distension.     Palpations: Abdomen is soft.     Tenderness: There is abdominal tenderness in the right lower quadrant and suprapubic area. There is no right CVA tenderness, left CVA tenderness, guarding or rebound. Negative signs include Murphy's sign.     Hernia: No hernia is present.     Comments: NO pulsatile abdominal mass.  Negative CVA tap bilaterally.  No overlying skin changes to abdomen or bilateral flanks  Musculoskeletal:        General: Normal range of motion.     Cervical back: Normal range of motion and neck supple.  Skin:    General: Skin is warm and dry.     Capillary Refill: Capillary refill takes less than 2 seconds.     Comments: Tactile temp to extremities  Neurological:     General: No focal deficit present.     Mental  Status: He is alert.     Cranial Nerves: Cranial nerves are intact.     Sensory: Sensation is intact.     Motor: Motor function is intact.     Coordination: Coordination is intact.     Gait: Gait is intact.     Comments: Cranial 2 through 12 grossly intact Ambulatory that difficulty     ED Results / Procedures / Treatments   Labs (all labs ordered are listed, but only abnormal results are displayed) Labs Reviewed  COMPREHENSIVE METABOLIC PANEL - Abnormal; Notable for the following components:      Result Value   CO2 20 (*)    Glucose, Bld 143 (*)    Creatinine, Ser 1.43 (*)    GFR calc non Af Amer 60 (*)    All other components within normal limits  CBC - Abnormal; Notable for the following components:   WBC 16.3 (*)    All other components within normal limits  URINALYSIS, ROUTINE W REFLEX MICROSCOPIC - Abnormal; Notable for the following components:   Glucose, UA 50 (*)    Hgb urine dipstick SMALL (*)    Ketones, ur 20 (*)    All other components within normal limits  LIPASE, BLOOD    EKG None  Radiology CT Abdomen Pelvis W Contrast  Result Date: 03/02/2020 CLINICAL DATA:  Right lower quadrant pain EXAM: CT ABDOMEN AND PELVIS WITH CONTRAST TECHNIQUE: Multidetector CT imaging of the abdomen and pelvis was performed using the standard protocol following bolus administration of intravenous contrast. CONTRAST:  OMNIPAQUE IOHEXOL 300 MG/ML  SOLN COMPARISON:  CT 11/16/2013 FINDINGS: Lower chest: No acute consolidation or effusion. Cardiac size within normal limits. Small hiatal hernia Hepatobiliary: No focal liver abnormality is seen. No gallstones, gallbladder wall thickening, or biliary dilatation. Pancreas: Unremarkable. No pancreatic ductal dilatation or surrounding inflammatory changes. Spleen: Normal in size without focal abnormality. Adrenals/Urinary Tract: Adrenal glands are normal. Mild right hydronephrosis and hydroureter, secondary to a punctate 2 mm stone in the  distal right ureter just proximal to the right UVJ.  Additional punctate stone in the lower pole of the right kidney. The bladder is unremarkable. Delayed right nephrogram, consistent with obstruction. Stomach/Bowel: The stomach is unremarkable. No dilated small bowel. Negative appendix. No bowel wall thickening. Vascular/Lymphatic: No significant vascular findings are present. No enlarged abdominal or pelvic lymph nodes. Reproductive: Prostate is unremarkable. Other: Negative for free air or free fluid. Small fat containing inguinal hernias Musculoskeletal: No acute or significant osseous findings. IMPRESSION: 1. Mild right hydronephrosis and hydroureter, secondary to a 2 mm distal ureteral stone just proximal to the right UVJ. 2. Punctate intrarenal stone on the right Electronically Signed   By: Jasmine Pang M.D.   On: 03/02/2020 19:49    Procedures Procedures (including critical care time)  Medications Ordered in ED Medications  sodium chloride flush (NS) 0.9 % injection 3 mL (has no administration in time range)  sodium chloride 0.9 % bolus 1,000 mL (0 mLs Intravenous Stopped 03/02/20 2015)  ondansetron (ZOFRAN) injection 4 mg (4 mg Intravenous Given 03/02/20 1923)  morphine 4 MG/ML injection 4 mg (4 mg Intravenous Given 03/02/20 1923)  iohexol (OMNIPAQUE) 300 MG/ML solution 100 mL (100 mLs Intravenous Contrast Given 03/02/20 1927)  sodium chloride (PF) 0.9 % injection (  Given 03/02/20 1949)  HYDROmorphone (DILAUDID) injection 1 mg (1 mg Intravenous Given 03/02/20 1949)  ketorolac (TORADOL) 30 MG/ML injection 30 mg (30 mg Intravenous Given 03/02/20 2002)   ED Course  I have reviewed the triage vital signs and the nursing notes.  Pertinent labs & imaging results that were available during my care of the patient were reviewed by me and considered in my medical decision making (see chart for details).  44 year old presents for evaluation of abdominal pain.  Began approximately 6 hours PTA.  Located  diffusely to lower abdomen.  No radiation.  No testicular edema, erythema or warmth.  No chest pain or shortness of breath.  Equal pulses bilaterally.  Tactile temperature to extremities.  Negative Murphy sign, McBurney point.  Work-up with labs started from triage  Labs and imaging personally reviewed and interpreted CBC with leukocytosis at 16.3 Metabolic panel with hyperglycemia to 143, creatinine 1.43 previous 1.08, GFR 60 Lipase 18 CT AP with mild left hydroureter, hydronephrosis due to a 2 mm still ureteral stone. UA negative for infection  1955: Patient reassessed, Pain continues.  Will order Dilaudid and Toradol.  2130: Reassessed.  States significant improvement in pain.  He is tolerating p.o. intake.  Requesting DC home.  States his ride is almost here.  Will DC home with short course of pain medication, Zofran.  Will strain his urine at home.  UA does not show evidence of infection.  Creatinine is 1.43 however previous 1 year ago 1.08.  Discussed copious fluid hydration.  He will return for any new or worsening symptoms.  Patient does not meet the SIRS or Sepsis criteria.  On repeat exam patient does not have a surgical abdomin and there are no peritoneal signs.  No indication of appendicitis, bowel obstruction, bowel perforation, cholecystitis, diverticulitis, dissection, AAA.    The patient has been appropriately medically screened and/or stabilized in the ED. I have low suspicion for any other emergent medical condition which would require further screening, evaluation or treatment in the ED or require inpatient management.  Patient is hemodynamically stable and in no acute distress.  Patient able to ambulate in department prior to ED.  Evaluation does not show acute pathology that would require ongoing or additional emergent interventions while in the emergency  department or further inpatient treatment.  I have discussed the diagnosis with the patient and answered all questions.  Pain  is been managed while in the emergency department and patient has no further complaints prior to discharge.  Patient is comfortable with plan discussed in room and is stable for discharge at this time.  I have discussed strict return precautions for returning to the emergency department.  Patient was encouraged to follow-up with PCP/specialist refer to at discharge.    MDM Rules/Calculators/A&P                      Final Clinical Impression(s) / ED Diagnoses Final diagnoses:  Nephrolithiasis  Ureteral stone with hydronephrosis    Rx / DC Orders ED Discharge Orders         Ordered    HYDROcodone-acetaminophen (NORCO/VICODIN) 5-325 MG tablet  Every 6 hours PRN     03/02/20 2134    ondansetron (ZOFRAN ODT) 4 MG disintegrating tablet  Every 8 hours PRN     03/02/20 2134           Zaccheaus Storlie A, PA-C 03/02/20 2146    Jacalyn Lefevre, MD 03/02/20 2217

## 2020-03-02 NOTE — Discharge Instructions (Addendum)
Take pain medicine as prescribed.  Return for new or worsening symptoms.

## 2020-03-02 NOTE — ED Triage Notes (Addendum)
Pt c/o abd pains that started a couple hours ago. Has vomited twice

## 2020-06-28 ENCOUNTER — Other Ambulatory Visit: Payer: Self-pay

## 2020-06-28 ENCOUNTER — Other Ambulatory Visit: Payer: 59

## 2020-06-28 DIAGNOSIS — Z20822 Contact with and (suspected) exposure to covid-19: Secondary | ICD-10-CM

## 2020-06-30 LAB — NOVEL CORONAVIRUS, NAA: SARS-CoV-2, NAA: NOT DETECTED

## 2020-06-30 LAB — SARS-COV-2, NAA 2 DAY TAT

## 2020-10-24 ENCOUNTER — Other Ambulatory Visit: Payer: Self-pay | Admitting: Family Medicine

## 2020-10-24 DIAGNOSIS — E790 Hyperuricemia without signs of inflammatory arthritis and tophaceous disease: Secondary | ICD-10-CM

## 2020-10-31 ENCOUNTER — Other Ambulatory Visit: Payer: Self-pay | Admitting: Family Medicine

## 2020-10-31 DIAGNOSIS — A6000 Herpesviral infection of urogenital system, unspecified: Secondary | ICD-10-CM

## 2021-03-02 ENCOUNTER — Emergency Department (HOSPITAL_COMMUNITY): Payer: 59

## 2021-03-02 ENCOUNTER — Emergency Department (HOSPITAL_COMMUNITY)
Admission: EM | Admit: 2021-03-02 | Discharge: 2021-03-02 | Disposition: A | Discharge status: Home / Self Care | Payer: 59 | Attending: Emergency Medicine | Admitting: Emergency Medicine

## 2021-03-02 ENCOUNTER — Encounter (HOSPITAL_COMMUNITY): Payer: Self-pay | Admitting: Emergency Medicine

## 2021-03-02 ENCOUNTER — Other Ambulatory Visit: Payer: Self-pay

## 2021-03-02 DIAGNOSIS — I719 Aortic aneurysm of unspecified site, without rupture: Secondary | ICD-10-CM | POA: Insufficient documentation

## 2021-03-02 DIAGNOSIS — I10 Essential (primary) hypertension: Secondary | ICD-10-CM | POA: Diagnosis not present

## 2021-03-02 DIAGNOSIS — F1410 Cocaine abuse, uncomplicated: Secondary | ICD-10-CM

## 2021-03-02 DIAGNOSIS — I7121 Aneurysm of the ascending aorta, without rupture: Secondary | ICD-10-CM

## 2021-03-02 DIAGNOSIS — Z955 Presence of coronary angioplasty implant and graft: Secondary | ICD-10-CM | POA: Insufficient documentation

## 2021-03-02 DIAGNOSIS — Z20822 Contact with and (suspected) exposure to covid-19: Secondary | ICD-10-CM | POA: Insufficient documentation

## 2021-03-02 DIAGNOSIS — R4182 Altered mental status, unspecified: Secondary | ICD-10-CM

## 2021-03-02 DIAGNOSIS — Z8616 Personal history of COVID-19: Secondary | ICD-10-CM | POA: Diagnosis not present

## 2021-03-02 DIAGNOSIS — I712 Thoracic aortic aneurysm, without rupture: Secondary | ICD-10-CM

## 2021-03-02 DIAGNOSIS — R072 Precordial pain: Secondary | ICD-10-CM | POA: Diagnosis present

## 2021-03-02 DIAGNOSIS — Z79899 Other long term (current) drug therapy: Secondary | ICD-10-CM | POA: Diagnosis not present

## 2021-03-02 LAB — ETHANOL: Alcohol, Ethyl (B): <10 mg/dL (ref <10)

## 2021-03-02 LAB — I-STAT CHEM 8, ED
BUN: 9 mg/dL (ref 6–20)
Calcium, Ion: 1.04 mmol/L — ABNORMAL LOW (ref 1.15–1.40)
Chloride: 104 mmol/L (ref 98–111)
Creatinine, Ser: 1.1 mg/dL (ref 0.61–1.24)
Glucose, Bld: 129 mg/dL — ABNORMAL HIGH (ref 70–99)
HCT: 50 % (ref 39.0–52.0)
Hemoglobin: 17 g/dL (ref 13.0–17.0)
Potassium: 3.5 mmol/L (ref 3.5–5.1)
Sodium: 137 mmol/L (ref 135–145)
TCO2: 18 mmol/L — ABNORMAL LOW (ref 22–32)

## 2021-03-02 LAB — BASIC METABOLIC PANEL
Anion gap: 11 (ref 5–15)
BUN: 9 mg/dL (ref 6–20)
CO2: 20 mmol/L — ABNORMAL LOW (ref 22–32)
Calcium: 9.2 mg/dL (ref 8.9–10.3)
Chloride: 103 mmol/L (ref 98–111)
Creatinine, Ser: 1.2 mg/dL (ref 0.61–1.24)
GFR, Estimated: >60 mL/min (ref >60)
Glucose, Bld: 128 mg/dL — ABNORMAL HIGH (ref 70–99)
Potassium: 3.3 mmol/L — ABNORMAL LOW (ref 3.5–5.1)
Sodium: 134 mmol/L — ABNORMAL LOW (ref 135–145)

## 2021-03-02 LAB — CBC
HCT: 46.3 % (ref 39.0–52.0)
Hemoglobin: 15.8 g/dL (ref 13.0–17.0)
MCH: 30.2 pg (ref 26.0–34.0)
MCHC: 34.1 g/dL (ref 30.0–36.0)
MCV: 88.4 fL (ref 80.0–100.0)
Platelets: 300 10*3/uL (ref 150–400)
RBC: 5.24 MIL/uL (ref 4.22–5.81)
RDW: 13.1 % (ref 11.5–15.5)
WBC: 8.2 10*3/uL (ref 4.0–10.5)
nRBC: 0 % (ref 0.0–0.2)

## 2021-03-02 LAB — TROPONIN I (HIGH SENSITIVITY)
Troponin I (High Sensitivity): 7 ng/L (ref <18)
Troponin I (High Sensitivity): 7 ng/L (ref <18)

## 2021-03-02 LAB — HEPATIC FUNCTION PANEL
ALT: 32 U/L (ref 0–44)
AST: 22 U/L (ref 15–41)
Albumin: 4.5 g/dL (ref 3.5–5.0)
Alkaline Phosphatase: 65 U/L (ref 38–126)
Bilirubin, Direct: 0.1 mg/dL (ref 0.0–0.2)
Indirect Bilirubin: 0.8 mg/dL (ref 0.3–0.9)
Total Bilirubin: 0.9 mg/dL (ref 0.3–1.2)
Total Protein: 8.4 g/dL — ABNORMAL HIGH (ref 6.5–8.1)

## 2021-03-02 LAB — SARS CORONAVIRUS 2 (TAT 6-24 HRS): SARS Coronavirus 2: NEGATIVE

## 2021-03-02 LAB — LIPASE, BLOOD: Lipase: 25 U/L (ref 11–51)

## 2021-03-02 LAB — CBG MONITORING, ED: Glucose-Capillary: 131 mg/dL — ABNORMAL HIGH (ref 70–99)

## 2021-03-02 MED ORDER — IOHEXOL 350 MG/ML SOLN
75.0000 mL | Freq: Once | INTRAVENOUS | Status: AC | PRN
  Administered 2021-03-02: 75 mL via INTRAVENOUS

## 2021-03-02 MED ORDER — SODIUM CHLORIDE 0.9 % IV BOLUS
1000.0000 mL | Freq: Once | INTRAVENOUS | Status: AC
  Administered 2021-03-02: 1000 mL via INTRAVENOUS

## 2021-03-02 MED ORDER — AMMONIA AROMATIC IN INHA
RESPIRATORY_TRACT | Status: AC
  Filled 2021-03-02: qty 10

## 2021-03-02 NOTE — Discharge Instructions (Signed)
Please follow up with your family doctor for recheck.  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

## 2021-03-02 NOTE — ED Provider Notes (Signed)
[facility] EMERGENCY DEPARTMENT Provider Note   CSN: [id number] Arrival date & time: 03/02/21  9622     History Chief Complaint  Patient presents with  . Chest Pain    Khoury Wilma Wuthrich is a 45 y.o. male.  The history is provided by a parent and medical records.  Chest Pain  Jarmel Noemi Ishmael is a 45 y.o. male who presents to the Emergency Department complaining of chest pain. Level five caveat due to altered mental status. History is provided by family. He presents the emergency department by EMS for evaluation of three days of chest pain, shortness of breath after smoking crack cocaine four days in a row. Mother reports that he has not slept for possibly 10 days. He has a history of hypertension, legal blindness. No reports of self harm attempt.    Past Medical History:  Diagnosis Date  . Acute duodenal ulcer with hemorrhage 10/12/2014  . GERD (gastroesophageal reflux disease)   . GI bleed   . Hepatitis C   . Hypertension   . 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 Legally blind in right eye, as defined in [place]   . Mesenteric adenitis   . Sliding hiatal hernia   . Substance abuse (HCC)    cocaine  . Terminal ileitis [facility])     Patient Active Problem List   Diagnosis Date Noted  . COVID-19 virus infection 05/01/2019  . Low back pain 03/02/2019  . Hyperlipidemia, unspecified 03/02/2019  . Upper GI bleed 08/31/2018  . Atypical chest pain 06/18/2018  . Mallory-Weiss tear 12/03/2016  . BPPV (benign paroxysmal positional vertigo) 09/12/2016  . Genital herpes 09/12/2016  . OSA (obstructive sleep apnea) 05/28/2016  . Obese 11/17/2015  . Cocaine abuse in remission (HCC) 11/16/2015  . Alcoholism (HCC) 11/16/2015  . Gout 11/16/2015  . Legally blind in right eye, as defined in [place]   . Acute duodenal ulcer with hemorrhage 10/12/2014  . EKG abnormality 10/12/2014  . Hypertension 10/06/2014  . Esophageal reflux 11/24/2013  .  Hepatitis C     Past Surgical History:  Procedure Laterality Date  . BIOPSY  09/08/2018   Procedure: BIOPSY;  Surgeon: Bernette Redbird, MD;  Location: [facility] ENDOSCOPY;  Service: Endoscopy;;  . ESOPHAGOGASTRODUODENOSCOPY N/A 10/13/2014   Procedure: ESOPHAGOGASTRODUODENOSCOPY (EGD);  Surgeon: Iva Boop, MD;  Location: [facility] ENDOSCOPY;  Service: Endoscopy;  Laterality: N/A;  . ESOPHAGOGASTRODUODENOSCOPY N/A 11/17/2016   Procedure: ESOPHAGOGASTRODUODENOSCOPY (EGD);  Surgeon: Charlott Rakes, MD;  Location: [facility] ENDOSCOPY;  Service: Endoscopy;  Laterality: N/A;  . ESOPHAGOGASTRODUODENOSCOPY (EGD) WITH PROPOFOL N/A 09/01/2018   Procedure: ESOPHAGOGASTRODUODENOSCOPY (EGD) WITH PROPOFOL;  Surgeon: Carman Ching, MD;  Location: [facility] ENDOSCOPY;  Service: Endoscopy;  Laterality: N/A;  . ESOPHAGOGASTRODUODENOSCOPY (EGD) WITH PROPOFOL N/A 09/08/2018   Procedure: ESOPHAGOGASTRODUODENOSCOPY (EGD) WITH PROPOFOL;  Surgeon: Bernette Redbird, MD;  Location: [facility] ENDOSCOPY;  Service: Endoscopy;  Laterality: N/A;  . EYE SURGERY Right   . FOOT SURGERY Right   . LEFT HEART CATH AND CORONARY ANGIOGRAPHY N/A 08/17/2019   Procedure: LEFT HEART CATH AND CORONARY ANGIOGRAPHY;  Surgeon: Rinaldo Cloud, MD;  Location: [facility] INVASIVE CV LAB;  Service: Cardiovascular;  Laterality: N/A;  . LIVER BIOPSY     Dr. Kinnie Scales GI       Family History  Problem Relation Age of Onset  . Rheum arthritis Mother   . Hypertension Mother   . Alcoholism Mother   . Heart failure Father  rheumatic heart disease, also drinking and smoking  . Alcoholism Father        died age 33  . Colon cancer Neg Hx     Social History   Tobacco Use  . Smoking status: Never Smoker  . Smokeless tobacco: Never Used  Vaping Use  . Vaping Use: Never used  Substance Use Topics  . Alcohol use: No    Comment: etoh free since 08/2015.  . Drug use: Yes    Types: Cocaine    Comment: used cocaine before but has been clean since 08/2015.     Home  Medications Prior to Admission medications   Medication Sig Start Date End Date Taking? Authorizing Provider  allopurinol (ZYLOPRIM) 100 MG tablet Take 1 tablet (100 mg total) by mouth daily. 06/16/19  Yes [name], MD  amLODipine (NORVASC) 5 MG tablet Take 5 mg by mouth daily. 07/29/19  Yes [provider]  atorvastatin (LIPITOR) 20 MG tablet Take 20 mg by mouth daily. 07/29/19  Yes [provider]  losartan (COZAAR) 50 MG tablet Take 50 mg by mouth daily. 07/22/19  Yes [provider]  omeprazole (PRILOSEC) 40 MG capsule TAKE 1 CAPSULE BY MOUTH EVERY DAY Patient taking differently: Take 40 mg by mouth daily. 08/31/19  Yes [name], MD  traZODone (DESYREL) 50 MG tablet Take 100 mg by mouth at bedtime as needed for sleep. 09/19/20  Yes [provider]  valACYclovir (VALTREX) 500 MG tablet TAKE 1 TABLET (500 MG TOTAL) BY MOUTH 2 (TWO) TIMES DAILY. FOR 3 DAYS WITH OUTBREAKS Patient taking differently: Take 500 mg by mouth 2 (two) times daily. 10/31/20  Yes [name], MD  HYDROcodone-acetaminophen (NORCO/VICODIN) 5-325 MG tablet Take 1-2 tablets by mouth every 6 (six) hours as needed. Patient not taking: Reported on 03/02/2021 03/02/20   Henderly, Britni A, PA-C  ondansetron (ZOFRAN ODT) 4 MG disintegrating tablet Take 1 tablet (4 mg total) by mouth every 8 (eight) hours as needed for nausea or vomiting. Patient not taking: Reported on 03/02/2021 03/02/20   Henderly, Britni A, PA-C    Allergies    Patient has no known allergies.  Review of Systems   Review of Systems  Cardiovascular: Positive for chest pain.  All other systems reviewed and are negative.   Physical Exam Updated Vital Signs BP (!) 158/92   Pulse 89   Temp 98.4 F (36.9 C) (Oral)   Resp 15   Ht  (1.778 m)   Wt 108.9 kg   SpO2 96%   BMI 34.45 kg/m   Physical Exam Vitals and nursing note reviewed.  Constitutional:      General: He is in acute distress.      Appearance: He is well-developed. He is ill-appearing.  HENT:     Head: Normocephalic and atraumatic.     Comments: Dry mucous membranes Cardiovascular:     Rate and Rhythm: Regular rhythm. Tachycardia present.     Heart sounds: No murmur heard.   Pulmonary:     Breath sounds: Normal breath sounds.     Comments: tachypnea Abdominal:     Palpations: Abdomen is soft.     Tenderness: There is no abdominal tenderness. There is no guarding or rebound.  Musculoskeletal:        General: No tenderness.     Comments: 2+ radial and DP pulses bilaterally  Skin:    General: Skin is warm and dry.  Neurological:     Comments: Unresponsive. GCS  one - one - one  Psychiatric:     Comments: Unable to assess     ED Results / Procedures / Treatments   Labs (all labs ordered are listed, but only abnormal results are displayed) Labs Reviewed  BASIC METABOLIC PANEL - Abnormal; Notable for the following components:      Result Value   Sodium 134 (*)    Potassium 3.3 (*)    CO2 20 (*)    Glucose, Bld 128 (*)    All other components within normal limits  HEPATIC FUNCTION PANEL - Abnormal; Notable for the following components:   Total Protein 8.4 (*)    All other components within normal limits  CBG MONITORING, ED - Abnormal; Notable for the following components:   Glucose-Capillary 131 (*)    All other components within normal limits  I-STAT CHEM 8, ED - Abnormal; Notable for the following components:   Glucose, Bld 129 (*)    Calcium, Ion 1.04 (*)    TCO2 18 (*)    All other components within normal limits  SARS CORONAVIRUS 2 (TAT 6-24 HRS)  CBC  LIPASE, BLOOD  ETHANOL  RAPID URINE DRUG SCREEN, HOSP PERFORMED  TROPONIN I (HIGH SENSITIVITY)  TROPONIN I (HIGH SENSITIVITY)    EKG EKG Interpretation  Date/Time:  Friday Mar 02 2021 06:40:51 EDT Ventricular Rate:  121 PR Interval:  148 QRS Duration: 94 QT Interval:  342 QTC Calculation: 485 R Axis:   58 Text Interpretation: Sinus  tachycardia Otherwise normal ECG Confirmed by Tilden Fossa [phone]) on 03/02/2021 7:02:06 AM   Radiology DG Chest 2 View  Result Date: 03/02/2021 CLINICAL DATA:  45 year old male with chest pain and shortness of breath this morning. Recent cocaine use. EXAM: CHEST - 2 VIEW COMPARISON:  Chest CT 07/14/2019 and earlier. FINDINGS: AP and lateral views. Low lung volumes. Normal cardiac size and mediastinal contours. Respiratory motion on the lateral view. No pneumothorax, pulmonary edema, pleural effusion or definite consolidation. No acute osseous abnormality identified. Negative visible bowel gas pattern. IMPRESSION: Low lung volumes. No acute cardiopulmonary abnormality. Electronically Signed   By: Odessa Fleming M.D.   On: 03/02/2021 07:25   CT Head Wo Contrast  Result Date: 03/02/2021 CLINICAL DATA:  Mental status change. EXAM: CT HEAD WITHOUT CONTRAST TECHNIQUE: Contiguous axial images were obtained from the base of the skull through the vertex without intravenous contrast. COMPARISON:  06/04/2011. FINDINGS: Brain: There is no evidence for acute hemorrhage, hydrocephalus, mass lesion, or abnormal extra-axial fluid collection. No definite CT evidence for acute infarction. Bilateral lacunar infarcts in the basal ganglia are similar to prior. Vascular: No hyperdense vessel or unexpected calcification. Skull: No evidence for fracture. No worrisome lytic or sclerotic lesion. Sinuses/Orbits: Fluid noted inferior left mastoid air cells. Scattered areas mucosal thickening noted paranasal sinuses. Old medial right orbital blowout fracture evident. Other: None. IMPRESSION: Stable exam.  No acute intracranial abnormality. Electronically Signed   By: Kennith Center M.D.   On: 03/02/2021 08:00   EEG adult  Result Date: 03/02/2021 Charlsie Quest, MD     03/02/2021 10:30 AM Patient Name: Ozil Stettler MRN: [id number] Epilepsy Attending: Charlsie Quest Referring Physician/Provider: Dr. Milon Dikes Date: 03/02/2021  Duration: 21.38 mins Patient history: 45 year old male with altered mental status.  EEG to evaluate for seizures. Level of alertness: Awake, asleep AEDs during EEG study: None Technical aspects: This EEG study was done with scalp electrodes positioned according to the 10-20 International system of electrode placement. Electrical activity was  acquired at a sampling rate of 500Hz  and reviewed with a high frequency filter of 70Hz  and a low frequency filter of 1Hz . EEG data were recorded continuously and digitally stored. Description: The posterior dominant rhythm consists of 9-10 Hz activity of moderate voltage (25-35 uV) seen predominantly in posterior head regions, symmetric and reactive to eye opening and eye closing. Sleep was characterized by vertex waves, sleep spindles (12 to 14 Hz), maximal frontocentral region. Hyperventilation and photic stimulation were not performed.   IMPRESSION: This study is within normal limits. No seizures or epileptiform discharges were seen throughout the recording.   CT Angio Chest/Abd/Pel for Dissection W and/or W/WO  Result Date: 03/02/2021 CLINICAL DATA:  Abdominal pain after cocaine use. Concern for aortic dissection. EXAM: CT ANGIOGRAPHY CHEST, ABDOMEN AND PELVIS TECHNIQUE: Multidetector CT imaging through the chest, abdomen and pelvis was performed using the standard protocol during bolus administration of intravenous contrast. Multiplanar reconstructed images and MIPs were obtained and reviewed to evaluate the vascular anatomy. CONTRAST:  71mL OMNIPAQUE IOHEXOL 350 MG/ML SOLN COMPARISON:  Abdomen/pelvis CT 03/02/2020. chest CT 07/14/2019 FINDINGS: CTA CHEST FINDINGS Cardiovascular: Pre contrast imaging shows no dense intramural crescent to suggest acute intramural hematoma. Imaging after IV contrast administration shows no thoracic aortic dissection. Ascending thoracic aorta measures 4.2 cm diameter. No large central embolus in the main or lobar pulmonary  arteries. Mediastinum/Nodes: No mediastinal lymphadenopathy. There is no hilar lymphadenopathy. The esophagus has normal imaging features. There is no axillary lymphadenopathy. Lungs/Pleura: 4 mm peripheral left lower lobe pulmonary nodule stable since prior chest CT consistent with benign etiology. No focal airspace consolidation. No pleural effusion. Musculoskeletal: No worrisome lytic or sclerotic osseous abnormality. Review of the MIP images confirms the above findings. CTA ABDOMEN AND PELVIS FINDINGS VASCULAR Aorta: Normal caliber aorta without aneurysm, dissection, vasculitis or significant stenosis. Celiac: Patent without evidence of aneurysm, dissection, vasculitis or significant stenosis. SMA: Patent without evidence of aneurysm, dissection, vasculitis or significant stenosis. Renals: Both renal arteries are patent without evidence of aneurysm, dissection, vasculitis, fibromuscular dysplasia or significant stenosis. IMA: Patent without evidence of aneurysm, dissection, vasculitis or significant stenosis. Inflow: Patent without evidence of aneurysm, dissection, vasculitis or significant stenosis. Veins: No obvious venous abnormality within the limitations of this arterial phase study. Review of the MIP images confirms the above findings. NON-VASCULAR Hepatobiliary: No suspicious focal abnormality within the liver parenchyma. There is no evidence for gallstones, gallbladder wall thickening, or pericholecystic fluid. No intrahepatic or extrahepatic biliary dilation. Pancreas: No focal mass lesion. No dilatation of the main duct. No intraparenchymal cyst. No peripancreatic edema. Spleen: No splenomegaly. No focal mass lesion. Adrenals/Urinary Tract: No adrenal nodule or mass. Tiny nonobstructing stone identified lower pole right kidney left kidney unremarkable. No evidence for hydroureter. The urinary bladder appears normal for the degree of distention. Stomach/Bowel: Stomach is decompressed. Duodenum is  normally positioned as is the ligament of Treitz. No small bowel wall thickening. No small bowel dilatation. The terminal ileum is normal. The appendix is normal. No gross colonic mass. No colonic wall thickening. Lymphatic: There is no gastrohepatic or hepatoduodenal ligament lymphadenopathy. No retroperitoneal or mesenteric lymphadenopathy. No pelvic sidewall lymphadenopathy. Reproductive: The prostate gland and seminal vesicles are unremarkable. Other: No intraperitoneal free fluid. Musculoskeletal: No worrisome lytic or sclerotic osseous abnormality. Review of the MIP images confirms the above findings. IMPRESSION: 1. No evidence for thoracoabdominal aortic dissection. 2. Ascending thoracic aorta measures 4.2 cm diameter, increased from 3.8 cm previously. Recommend annual imaging followup by CTA  or MRA. This recommendation follows 2010 ACCF/AHA/AATS/ACR/ASA/SCA/SCAI/SIR/STS/SVM Guidelines for the Diagnosis and Management of Patients with Thoracic Aortic Disease. Circulation. 2010; 121: Z610-R604: E266-e369. Aortic aneurysm NOS (ICD10-I71.9) 3. Tiny nonobstructing right renal stone. Electronically Signed   By: Kennith CenterEric  Mansell M.D.   On: 03/02/2021 08:10    Procedures Procedures  CRITICAL CARE Performed by: Tilden FossaElizabeth Gerrett Loman   Total critical care time: 45 minutes  Critical care time was exclusive of separately billable procedures and treating other patients.  Critical care was necessary to treat or prevent imminent or life-threatening deterioration.  Critical care was time spent personally by me on the following activities: development of treatment plan with patient and/or surrogate as well as nursing, discussions with consultants, evaluation of patient's response to treatment, examination of patient, obtaining history from patient or surrogate, ordering and performing treatments and interventions, ordering and review of laboratory studies, ordering and review of radiographic studies, pulse oximetry and  re-evaluation of patient's condition.  Medications Ordered in ED Medications  ammonia inhalant (  Given 03/02/21 0730)  sodium chloride 0.9 % bolus 1,000 mL (0 mLs Intravenous Stopped 03/02/21 0833)  iohexol (OMNIPAQUE) 350 MG/ML injection 75 mL (75 mLs Intravenous Contrast Given 03/02/21 0755)    ED Course  I have reviewed the triage vital signs and the nursing notes.  Pertinent labs & imaging results that were available during my care of the patient were reviewed by me and considered in my medical decision making (see chart for details).    MDM Rules/Calculators/A&P                         patient with history of cocaine abuse here for evaluation of chest pain per report. On ED presentation patient unresponsive with tachycardia, tachypnea. After approximately 30 minutes his tachycardia and tachypnea improved but his responsiveness was very slow to return. He did slowly start following commands. Initial concern for possible seizure and stat EEG was obtained, which is negative for acute seizure activity. Also concern for subarachnoid hemorrhage versus dissection and imaging was obtained, which is negative for acute dissection, PE or subarachnoid hemorrhage. Following observation in the emergency department patient continue to improve and return to baseline. Chest pain resolved. He states that he does want help with his cocaine use and has been in treatment in the past. Discussed incidental finding of aortic aneurysm on CT chest and importance of follow-up. Also discussed importance of abstaining from cocaine and stimulants, as these can worsened his aneurysm. Discussed return precautions.   Final Clinical Impression(s) / ED Diagnoses Final diagnoses:  Precordial pain  Cocaine abuse (HCC)  Ascending aortic aneurysm [facility](HCC)    Rx / DC Orders ED Discharge Orders    None       Tilden Fossaees, Hillary Schwegler, MD 03/02/21 1528

## 2021-03-02 NOTE — Progress Notes (Signed)
EEG complete - results pending 

## 2021-03-02 NOTE — Procedures (Signed)
Patient Name: Berkeley Vanaken  MRN: [id number]  Epilepsy Attending: Charlsie Quest  Referring Physician/Provider: Dr. Milon Dikes Date: 03/02/2021 Duration: 21.38 mins  Patient history: 45 year old male with altered mental status.  EEG to evaluate for seizures.  Level of alertness: Awake, asleep  AEDs during EEG study: None  Technical aspects: This EEG study was done with scalp electrodes positioned according to the 10-20 International system of electrode placement. Electrical activity was acquired at a sampling rate of 500Hz  and reviewed with a high frequency filter of 70Hz  and a low frequency filter of 1Hz . EEG data were recorded continuously and digitally stored.   Description: The posterior dominant rhythm consists of 9-10 Hz activity of moderate voltage (25-35 uV) seen predominantly in posterior head regions, symmetric and reactive to eye opening and eye closing. Sleep was characterized by vertex waves, sleep spindles (12 to 14 Hz), maximal frontocentral region. Hyperventilation and photic stimulation were not performed.     IMPRESSION: This study is within normal limits. No seizures or epileptiform discharges were seen throughout the recording.  Asli Tokarski 

## 2021-03-02 NOTE — ED Notes (Signed)
Patient ambulated to nurse's desk and back to room with steady gait.

## 2021-03-02 NOTE — ED Notes (Signed)
Pt transported to xray via stretcher

## 2021-03-02 NOTE — ED Triage Notes (Signed)
Patient arrived with EMS from home reports central chest pain with SOB this morning after 3 days of crack-cocaine use , no emesis or diaphoresis , received NS IV 750 ml prior to arrival by EMS . Somnolent at arrival .

## 2021-07-08 ENCOUNTER — Encounter: Payer: Self-pay | Admitting: Emergency Medicine

## 2021-07-08 ENCOUNTER — Ambulatory Visit (INDEPENDENT_AMBULATORY_CARE_PROVIDER_SITE_OTHER): Payer: Self-pay

## 2021-07-08 ENCOUNTER — Other Ambulatory Visit: Payer: Self-pay

## 2021-07-08 ENCOUNTER — Ambulatory Visit
Admission: EM | Admit: 2021-07-08 | Discharge: 2021-07-08 | Disposition: A | Discharge status: Home / Self Care | Payer: Self-pay | Attending: Internal Medicine | Admitting: Internal Medicine

## 2021-07-08 DIAGNOSIS — M79672 Pain in left foot: Secondary | ICD-10-CM

## 2021-07-08 DIAGNOSIS — M79671 Pain in right foot: Secondary | ICD-10-CM

## 2021-07-08 DIAGNOSIS — M79675 Pain in left toe(s): Secondary | ICD-10-CM

## 2021-07-08 DIAGNOSIS — M109 Gout, unspecified: Secondary | ICD-10-CM

## 2021-07-08 MED ORDER — COLCHICINE 0.6 MG PO TABS: 0.6000 mg | ORAL_TABLET | Freq: Every day | ORAL | 0 refills | Status: DC

## 2021-07-08 NOTE — ED Triage Notes (Addendum)
Hx of gout. Right big toe and left pinky toe in pain, mild redness, slight swelling. Out of his gout medication

## 2021-07-08 NOTE — ED Provider Notes (Signed)
[facility]    CSN: [id number] Arrival date & time: 07/08/21  0816      History   Chief Complaint Chief Complaint  Patient presents with   Toe Pain    HPI Gabriel Garcia is a 45 y.o. male.   Patient presents with bilateral foot pain that has been present for approximately 2 days.  Pain is present in the right great toe and on the dorsal surface of the left lateral foot.  Patient denies any injury to either foot.  Patient does have history of gout and states that this feels similar.  Patient typically takes colchicine to alleviate gout pains but does not currently have any colchicine available at home.  Patient used to take allopurinol but this has been discontinued per patient.  Patient reports that he does not typically have gout bilaterally and has never had gout in the dorsal surface of the foot before, so he is concerned that there is an injury there.  Denies any numbness or tingling to either foot.   Toe Pain   Past Medical History:  Diagnosis Date   Acute duodenal ulcer with hemorrhage 10/12/2014   GERD (gastroesophageal reflux disease)    GI bleed    Hepatitis C    Hypertension    Legal blindness of right eye, as defined in [place] 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[facility])     Patient Active Problem List   Diagnosis Date Noted   COVID-19 virus infection 05/01/2019   Low back pain 03/02/2019   Hyperlipidemia, unspecified 03/02/2019   Upper GI bleed 08/31/2018   Atypical chest pain 06/18/2018   Mallory-Weiss tear 12/03/2016   BPPV (benign paroxysmal positional vertigo) 09/12/2016   Genital herpes 09/12/2016   OSA (obstructive sleep apnea) 05/28/2016   Obese 11/17/2015   Cocaine abuse in remission (HCC) 11/16/2015   Alcoholism (HCC) 11/16/2015   Gout 11/16/2015   Legally blind in right eye, as defined in [place]    Acute  duodenal ulcer with hemorrhage 10/12/2014   EKG abnormality 10/12/2014   Hypertension 10/06/2014   Esophageal reflux 11/24/2013   Hepatitis C     Past Surgical History:  Procedure Laterality Date   BIOPSY  09/08/2018   Procedure: BIOPSY;  Surgeon: Bernette Redbird, MD;  Location: [facility] ENDOSCOPY;  Service: Endoscopy;;   ESOPHAGOGASTRODUODENOSCOPY N/A 10/13/2014   Procedure: ESOPHAGOGASTRODUODENOSCOPY (EGD);  Surgeon: Iva Boop, MD;  Location: [facility];  Service: Endoscopy;  Laterality: N/A;   ESOPHAGOGASTRODUODENOSCOPY N/A 11/17/2016   Procedure: ESOPHAGOGASTRODUODENOSCOPY (EGD);  Surgeon: Charlott Rakes, MD;  Location: [facility] ENDOSCOPY;  Service: Endoscopy;  Laterality: N/A;   ESOPHAGOGASTRODUODENOSCOPY (EGD) WITH PROPOFOL N/A 09/01/2018   Procedure: ESOPHAGOGASTRODUODENOSCOPY (EGD) WITH PROPOFOL;  Surgeon: Carman Ching, MD;  Location: [facility]OS[facility];  Service: Endoscopy;  Laterality: N/A;   ESOPHAGOGASTRODUODENOSCOPY (EGD) WITH PROPOFOL N/A 09/08/2018   Procedure: ESOPHAGOGASTRODUODENOSCOPY (EGD) WITH PROPOFOL;  Surgeon: Bernette Redbird, MD;  Location: [facility]OS[facility];  Service: Endoscopy;  Laterality: N/A;   EYE SURGERY Right    FOOT SURGERY Right    LEFT HEART CATH AND CORONARY ANGIOGRAPHY N/A 08/17/2019   Procedure: LEFT HEART CATH AND CORONARY ANGIOGRAPHY;  Surgeon: Rinaldo Cloud, MD;  Location: [facility] INVASIVE CV LAB;  Service: Cardiovascular;  Laterality: N/A;   LIVER BIOPSY     Dr. Kinnie Scales GI  Home Medications    Prior to Admission medications   Medication Sig Start Date End Date Taking? Authorizing Provider  colchicine 0.6 MG tablet Take 1 tablet (0.6 mg total) by mouth daily. Please take 1.2 mg initially, and then 0.6 mg 1 hour after. 07/08/21  Yes Lance Muss, FNP  allopurinol (ZYLOPRIM) 100 MG tablet Take 1 tablet (100 mg total) by mouth daily. 06/16/19   [name], MD  amLODipine (NORVASC) 5 MG tablet Take 5 mg by mouth daily. 07/29/19   [provider]  atorvastatin (LIPITOR) 20 MG tablet Take 20 mg by mouth daily. 07/29/19   [provider]  HYDROcodone-acetaminophen (NORCO/VICODIN) 5-325 MG tablet Take 1-2 tablets by mouth every 6 (six) hours as needed. Patient not taking: Reported on 03/02/2021 03/02/20   Henderly, Britni A, PA-C  losartan (COZAAR) 50 MG tablet Take 50 mg by mouth daily. 07/22/19   [provider]  omeprazole (PRILOSEC) 40 MG capsule TAKE 1 CAPSULE BY MOUTH EVERY DAY Patient taking differently: Take 40 mg by mouth daily. 08/31/19   [name], MD  ondansetron (ZOFRAN ODT) 4 MG disintegrating tablet Take 1 tablet (4 mg total) by mouth every 8 (eight) hours as needed for nausea or vomiting. Patient not taking: Reported on 03/02/2021 03/02/20   Henderly, Britni A, PA-C  traZODone (DESYREL) 50 MG tablet Take 100 mg by mouth at bedtime as needed for sleep. 09/19/20   [provider]  valACYclovir (VALTREX) 500 MG tablet TAKE 1 TABLET (500 MG TOTAL) BY MOUTH 2 (TWO) TIMES DAILY. FOR 3 DAYS WITH OUTBREAKS Patient taking differently: Take 500 mg by mouth 2 (two) times daily. 10/31/20   [name], MD    Family History Family History  Problem Relation Age of Onset   Rheum arthritis Mother    Hypertension Mother    Alcoholism Mother    Heart failure Father        rheumatic heart disease, also drinking and smoking   Alcoholism Father        died age 49   Colon cancer Neg Hx     Social History Social History   Tobacco Use   Smoking status: Never   Smokeless tobacco: Never  Vaping Use   Vaping Use: Never used  Substance Use Topics   Alcohol use: No    Comment: etoh free since 08/2015.   Drug use: Yes    Types: Cocaine    Comment: used cocaine before but has been clean since 08/2015.      Allergies   Patient has no known allergies.   Review of Systems Review of Systems Per HPI  Physical Exam Triage Vital Signs ED Triage Vitals  Enc Vitals Group     BP 07/08/21 0926  125/82     Pulse Rate 07/08/21 0926 (!) 105     Resp 07/08/21 0926 16     Temp 07/08/21 0926 (!) 97.4 F (36.3 C)     Temp Source 07/08/21 0926 Oral     SpO2 07/08/21 0926 96 %     Weight --      Height --      Head Circumference --      Peak Flow --      Pain Score 07/08/21 0927 8     Pain Loc --      Pain Edu? --      Excl. in [place]? --    No data found.  Updated Vital Signs BP 125/82 (  BP Location: Left Arm)   Pulse (!) 105   Temp (!) 97.4 F (36.3 C) (Oral)   Resp 16   SpO2 96%   Visual Acuity Right Eye Distance:   Left Eye Distance:   Bilateral Distance:    Right Eye Near:   Left Eye Near:    Bilateral Near:     Physical Exam Constitutional:      Appearance: Normal appearance.  HENT:     Head: Normocephalic and atraumatic.  Eyes:     Extraocular Movements: Extraocular movements intact.     Conjunctiva/sclera: Conjunctivae normal.  Pulmonary:     Effort: Pulmonary effort is normal.  Musculoskeletal:     Right foot: Normal range of motion and normal capillary refill. Swelling, tenderness and bony tenderness present. No deformity or crepitus. Normal pulse.     Left foot: Normal range of motion and normal capillary refill. Swelling and tenderness present. No deformity, bony tenderness or crepitus. Normal pulse.     Comments: Tenderness to palpation throughout right great toe.  Erythema also noted.  Tenderness to palpation to dorsal surface of foot with associated swelling along third and fourth metatarsal.  Neurovascular intact bilaterally.  Neurological:     General: No focal deficit present.     Mental Status: He is alert and oriented to person, place, and time. Mental status is at baseline.  Psychiatric:        Mood and Affect: Mood normal.        Behavior: Behavior normal.        Thought Content: Thought content normal.        Judgment: Judgment normal.     UC Treatments / Results  Labs (all labs ordered are listed, but only abnormal results are  displayed) Labs Reviewed - No data to display  EKG   Radiology DG Foot Complete Left  Result Date: 07/08/2021 CLINICAL DATA:  45 year old male with foot pain, big toe and pinky. Mild redness and swelling. EXAM: LEFT FOOT - COMPLETE 3+ VIEW COMPARISON:  Left foot series 11/04/2013. FINDINGS: Bone mineralization is within normal limits. There is no evidence of fracture or dislocation. Preserved joint spaces and alignment. There is no evidence of arthropathy or other focal bone abnormality. No discrete soft tissue abnormality. IMPRESSION: Negative. Electronically Signed   By: Odessa Fleming M.D.   On: 07/08/2021 10:26    Procedures Procedures (including critical care time)  Medications Ordered in UC Medications - No data to display  Initial Impression / Assessment and Plan / UC Course  I have reviewed the triage vital signs and the nursing notes.  Pertinent labs & imaging results that were available during my care of the patient were reviewed by me and considered in my medical decision making (see chart for details).     Left foot x-ray was negative for any acute bony abnormality.  Suspect gout flareup in bilateral feet.  Will treat with colchicine due to successful treatment with this in the past for patient.  Patient is not currently taking atorvastatin or any cholesterol medication, so colchicine should be safe for patient to take.  Although advised patient to not take atorvastatin with colchicine as well.  Patient to follow-up with PCP for further evaluation and management and for prevention of gout flares. No red flags on exam.Discussed strict return precautions. Patient verbalized understanding and is agreeable with plan.  Final Clinical Impressions(s) / UC Diagnoses   Final diagnoses:  Acute gout, unspecified cause, unspecified site  Bilateral foot pain  Discharge Instructions      You have been prescribed colchicine to take for your gout.  Please follow-up with primary care for  further evaluation and management.  Please do not take atorvastatin while taking colchicine.     ED Prescriptions     Medication Sig Dispense Auth. Provider   colchicine 0.6 MG tablet Take 1 tablet (0.6 mg total) by mouth daily. Please take 1.2 mg initially, and then 0.6 mg 1 hour after. 3 tablet Lance Muss, FNP      PDMP not reviewed this encounter.   Lance Muss, FNP 07/08/21 1055

## 2021-07-08 NOTE — Discharge Instructions (Addendum)
You have been prescribed colchicine to take for your gout.  Please follow-up with primary care for further evaluation and management.  Please do not take atorvastatin while taking colchicine.

## 2021-09-08 ENCOUNTER — Encounter: Payer: Self-pay | Admitting: *Deleted

## 2021-09-08 ENCOUNTER — Other Ambulatory Visit: Payer: Self-pay

## 2021-09-08 ENCOUNTER — Ambulatory Visit
Admission: EM | Admit: 2021-09-08 | Discharge: 2021-09-08 | Disposition: A | Discharge status: Home / Self Care | Payer: Self-pay | Attending: Physician Assistant | Admitting: Physician Assistant

## 2021-09-08 DIAGNOSIS — R430 Anosmia: Secondary | ICD-10-CM

## 2021-09-08 DIAGNOSIS — J0100 Acute maxillary sinusitis, unspecified: Secondary | ICD-10-CM

## 2021-09-08 HISTORY — DX: Gout, unspecified: M10.9

## 2021-09-08 MED ORDER — AMOXICILLIN-POT CLAVULANATE 875-125 MG PO TABS: 1.0000 | ORAL_TABLET | Freq: Two times a day (BID) | ORAL | 0 refills | Status: DC

## 2021-09-08 NOTE — ED Triage Notes (Addendum)
C/O HA, frontal and maxillary sinus pressure and congestion with bilat plugged ears x 1 wk.  Started with right eye periorbital swelling (pt notes he's had previous trauma to right eye in past, so it normally "looks different"), postnasal drainage with burning in chest. Denies fevers.  Has been taking OTC cold meds. Reports 2 negative home Covid tests, but c/o loss of taste and smell.

## 2021-09-09 LAB — COVID-19, FLU A+B NAA
Influenza A, NAA: NOT DETECTED
Influenza B, NAA: NOT DETECTED
SARS-CoV-2, NAA: NOT DETECTED

## 2021-09-09 NOTE — ED Provider Notes (Signed)
[facility]    CSN: [id number] Arrival date & time: 09/08/21  1306      History   Chief Complaint Chief Complaint  Patient presents with   Facial Pain    HPI Gabriel Garcia is a 45 y.o. male.   Patient here today for evaluation of sinus congestion and pressure he has had over the last week.  He reports that he also feels that his ears are plugged.  He has not had fever.  He does note that over the last few days he started to lose his sense of taste and smell.  He has had some cough.  He does not report any vomiting or diarrhea.  He has tried over-the-counter medications without resolution of symptoms.  The history is provided by the patient.   Past Medical History:  Diagnosis Date   Acute duodenal ulcer with hemorrhage 10/12/2014   GERD (gastroesophageal reflux disease)    GI bleed    Gout    Hepatitis C    Hypertension    Legal blindness of right eye, as defined in [place] 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[facility])     Patient Active Problem List   Diagnosis Date Noted   COVID-19 virus infection 05/01/2019   Low back pain 03/02/2019   Hyperlipidemia, unspecified 03/02/2019   Upper GI bleed 08/31/2018   Atypical chest pain 06/18/2018   Mallory-Weiss tear 12/03/2016   BPPV (benign paroxysmal positional vertigo) 09/12/2016   Genital herpes 09/12/2016   OSA (obstructive sleep apnea) 05/28/2016   Obese 11/17/2015   Cocaine abuse in remission ([place]) 11/16/2015   Alcoholism ([place]) 11/16/2015   Gout 11/16/2015   Legally blind in right eye, as defined in [place]    Acute duodenal ulcer with hemorrhage 10/12/2014   EKG abnormality 10/12/2014   Hypertension 10/06/2014   Esophageal reflux 11/24/2013   Hepatitis C     Past Surgical History:  Procedure Laterality Date   BIOPSY  09/08/2018   Procedure: BIOPSY;  Surgeon: Ronald Lobo,  MD;  Location: [facility] ENDOSCOPY;  Service: Endoscopy;;   ESOPHAGOGASTRODUODENOSCOPY N/A 10/13/2014   Procedure: ESOPHAGOGASTRODUODENOSCOPY (EGD);  Surgeon: Gatha Mayer, MD;  Location: [facility];  Service: Endoscopy;  Laterality: N/A;   ESOPHAGOGASTRODUODENOSCOPY N/A 11/17/2016   Procedure: ESOPHAGOGASTRODUODENOSCOPY (EGD);  Surgeon: Wilford Corner, MD;  Location: [facility] ENDOSCOPY;  Service: Endoscopy;  Laterality: N/A;   ESOPHAGOGASTRODUODENOSCOPY (EGD) WITH PROPOFOL N/A 09/01/2018   Procedure: ESOPHAGOGASTRODUODENOSCOPY (EGD) WITH PROPOFOL;  Surgeon: Laurence Spates, MD;  Location: [facility] ENDOSCOPY;  Service: Endoscopy;  Laterality: N/A;   ESOPHAGOGASTRODUODENOSCOPY (EGD) WITH PROPOFOL N/A 09/08/2018   Procedure: ESOPHAGOGASTRODUODENOSCOPY (EGD) WITH PROPOFOL;  Surgeon: Ronald Lobo, MD;  Location: [facility];  Service: Endoscopy;  Laterality: N/A;   EYE SURGERY Right    FOOT SURGERY Right    LEFT HEART CATH AND CORONARY ANGIOGRAPHY N/A 08/17/2019   Procedure: LEFT HEART CATH AND CORONARY ANGIOGRAPHY;  Surgeon: Charolette Forward, MD;  Location: [place] CV LAB;  Service: Cardiovascular;  Laterality: N/A;   LIVER BIOPSY     Dr. Earlean Shawl GI       Home Medications    Prior to Admission medications   Medication Sig Start Date End Date Taking? Authorizing Provider  amLODipine (NORVASC) 5 MG tablet Take 5 mg by mouth daily. 07/29/19  Yes [provider]  atorvastatin (LIPITOR) 20 MG tablet Take 20 mg by mouth daily. 07/29/19  Yes [provider]  losartan (COZAAR) 50 MG tablet Take 100 mg by mouth daily. 07/22/19  Yes [provider]  omeprazole (PRILOSEC) 40 MG capsule TAKE 1 CAPSULE BY MOUTH EVERY DAY Patient taking differently: Take 40 mg by mouth daily. 08/31/19  Yes [name], MD  allopurinol (ZYLOPRIM) 100 MG tablet Take 1 tablet (100 mg total) by mouth daily. 06/16/19   [name], MD  amoxicillin-clavulanate (AUGMENTIN) 875-125 MG tablet Take 1  tablet by mouth every 12 (twelve) hours. 09/08/21   Tomi Bamberger, PA-C  colchicine 0.6 MG tablet Take 1 tablet (0.6 mg total) by mouth daily. Please take 1.2 mg initially, and then 0.6 mg 1 hour after. 07/08/21   Gustavus Bryant, FNP  HYDROcodone-acetaminophen (NORCO/VICODIN) 5-325 MG tablet Take 1-2 tablets by mouth every 6 (six) hours as needed. Patient not taking: Reported on 03/02/2021 03/02/20   Henderly, Britni A, PA-C  ondansetron (ZOFRAN ODT) 4 MG disintegrating tablet Take 1 tablet (4 mg total) by mouth every 8 (eight) hours as needed for nausea or vomiting. Patient not taking: Reported on 03/02/2021 03/02/20   Henderly, Britni A, PA-C  traZODone (DESYREL) 50 MG tablet Take 100 mg by mouth at bedtime as needed for sleep. 09/19/20   [provider]  valACYclovir (VALTREX) 500 MG tablet TAKE 1 TABLET (500 MG TOTAL) BY MOUTH 2 (TWO) TIMES DAILY. FOR 3 DAYS WITH OUTBREAKS Patient taking differently: Take 500 mg by mouth 2 (two) times daily. 10/31/20   [name], MD    Family History Family History  Problem Relation Age of Onset   Rheum arthritis Mother    Hypertension Mother    Alcoholism Mother    Heart failure Father        rheumatic heart disease, also drinking and smoking   Alcoholism Father        died age 82   Colon cancer Neg Hx     Social History Social History   Tobacco Use   Smoking status: Never   Smokeless tobacco: Never  Vaping Use   Vaping Use: Never used  Substance Use Topics   Alcohol use: No    Comment: since 2016   Drug use: Yes    Types: Cocaine    Comment: clean x approx 1 yr     Allergies   Patient has no known allergies.   Review of Systems Review of Systems  Constitutional:  Negative for chills and fever.  HENT:  Positive for congestion and sore throat. Negative for ear pain.   Eyes:  Negative for discharge and redness.  Respiratory:  Positive for cough. Negative for shortness of breath.   Gastrointestinal:  Negative for  abdominal pain, diarrhea, nausea and vomiting.    Physical Exam Triage Vital Signs ED Triage Vitals  Enc Vitals Group     BP 09/08/21 1416 (!) 163/101     Pulse Rate 09/08/21 1416 81     Resp 09/08/21 1416 16     Temp 09/08/21 1416 98 F (36.7 C)     Temp Source 09/08/21 1416 Oral     SpO2 09/08/21 1416 97 %     Weight --      Height --      Head Circumference --      Peak Flow --      Pain Score 09/08/21 1418 8     Pain Loc --  Pain Edu? --      Excl. in [place]? --    No data found.  Updated Vital Signs BP (!) 163/101 Comment: states just went back on HTN med yesterday  Pulse 81   Temp 98 F (36.7 C) (Oral)   Resp 16   SpO2 97%      Physical Exam Vitals and nursing note reviewed.  Constitutional:      General: He is not in acute distress.    Appearance: Normal appearance. He is not ill-appearing.  HENT:     Head: Normocephalic and atraumatic.     Right Ear: Tympanic membrane normal.     Left Ear: Tympanic membrane normal.     Nose: Congestion present.     Mouth/Throat:     Mouth: Mucous membranes are moist.     Pharynx: Oropharynx is clear. No oropharyngeal exudate or posterior oropharyngeal erythema.  Eyes:     Conjunctiva/sclera: Conjunctivae normal.  Cardiovascular:     Rate and Rhythm: Normal rate and regular rhythm.     Heart sounds: Normal heart sounds. No murmur heard. Pulmonary:     Effort: Pulmonary effort is normal. No respiratory distress.     Breath sounds: Normal breath sounds. No wheezing, rhonchi or rales.  Skin:    General: Skin is warm and dry.  Neurological:     Mental Status: He is alert.  Psychiatric:        Mood and Affect: Mood normal.        Thought Content: Thought content normal.     UC Treatments / Results  Labs (all labs ordered are listed, but only abnormal results are displayed) Labs Reviewed  COVID-19, FLU A+B NAA    EKG   Radiology No results found.  Procedures Procedures (including critical care  time)  Medications Ordered in UC Medications - No data to display  Initial Impression / Assessment and Plan / UC Course  I have reviewed the triage vital signs and the nursing notes.  Pertinent labs & imaging results that were available during my care of the patient were reviewed by me and considered in my medical decision making (see chart for details).    Will treat to cover sinusitis with Augmentin but recommended we also screen for COVID given loss of smell.  Will await results for further recommendation.  Encouraged follow-up with any further concerns.  Final Clinical Impressions(s) / UC Diagnoses   Final diagnoses:  Acute maxillary sinusitis, recurrence not specified  Loss of sense of smell   Discharge Instructions   None    ED Prescriptions     Medication Sig Dispense Auth. Provider   amoxicillin-clavulanate (AUGMENTIN) 875-125 MG tablet  (Status: Discontinued) Take 1 tablet by mouth every 12 (twelve) hours. 14 tablet Ewell Poe F, PA-C   amoxicillin-clavulanate (AUGMENTIN) 875-125 MG tablet Take 1 tablet by mouth every 12 (twelve) hours. 14 tablet Francene Finders, PA-C      PDMP not reviewed this encounter.   Francene Finders, PA-C 09/09/21 [phone]

## 2021-10-19 ENCOUNTER — Ambulatory Visit: Payer: Self-pay | Admitting: Family Medicine

## 2021-10-19 ENCOUNTER — Other Ambulatory Visit: Payer: Self-pay

## 2021-10-19 MED ORDER — COLCHICINE 0.6 MG PO TABS: 0.6000 mg | ORAL_TABLET | Freq: Every day | ORAL | 0 refills | Status: DC

## 2021-12-14 NOTE — Progress Notes (Signed)
? ?Phone [phone] ?In person visit ?  ?Subjective:  ? ?Gabriel Garcia is a 46 y.o. year old very pleasant male patient who presents for/with See problem oriented charting ?Chief Complaint  ?Patient presents with  ? Follow-up  ?  Pt would like to have his PSA checked.  ? Hypertension  ? Gout  ?  Pt would like to get put on Allopurinol.  ? ? ?This visit occurred during the SARS-CoV-2 public health emergency.  Safety protocols were in place, including screening questions prior to the visit, additional usage of staff PPE, and extensive cleaning of exam room while observing appropriate contact time as indicated for disinfecting solutions.  ? ?Past Medical History-  ?Patient Active Problem List  ? Diagnosis Date Noted  ? Coronary atherosclerosis 12/21/2021  ?  Priority: High  ? COVID-19 virus infection 05/01/2019  ?  Priority: High  ? Upper GI bleed 08/31/2018  ?  Priority: High  ? Mallory-Weiss tear 12/03/2016  ?  Priority: High  ? Cocaine abuse in remission (HCC) 11/16/2015  ?  Priority: High  ? Alcoholism (HCC) 11/16/2015  ?  Priority: High  ? Hepatitis C   ?  Priority: High  ? Hyperlipidemia, unspecified 03/02/2019  ?  Priority: Medium   ? Genital herpes 09/12/2016  ?  Priority: Medium   ? OSA (obstructive sleep apnea) 05/28/2016  ?  Priority: Medium   ? Gout 11/16/2015  ?  Priority: Medium   ? Acute duodenal ulcer with hemorrhage 10/12/2014  ?  Priority: Medium   ? Hypertension 10/06/2014  ?  Priority: Medium   ? Low back pain 03/02/2019  ?  Priority: Low  ? BPPV (benign paroxysmal positional vertigo) 09/12/2016  ?  Priority: Low  ? Obese 11/17/2015  ?  Priority: Low  ? Legally blind in right eye, as defined in [place]   ?  Priority: Low  ? EKG abnormality 10/12/2014  ?  Priority: Low  ? Esophageal reflux 11/24/2013  ?  Priority: Low  ? Atypical chest pain 06/18/2018  ? ? ?Medications- reviewed and updated ?Current Outpatient Medications  ?Medication Sig Dispense Refill  ? amLODipine (NORVASC) 5 MG tablet Take  5 mg by mouth daily.    ? atorvastatin (LIPITOR) 20 MG tablet Take 20 mg by mouth daily.    ? losartan (COZAAR) 50 MG tablet Take 100 mg by mouth daily.    ? omeprazole (PRILOSEC) 40 MG capsule TAKE 1 CAPSULE BY MOUTH EVERY DAY (Patient taking differently: Take 40 mg by mouth daily.) 90 capsule 1  ? traZODone (DESYREL) 50 MG tablet Take 100 mg by mouth at bedtime as needed for sleep.    ? valACYclovir (VALTREX) 500 MG tablet TAKE 1 TABLET (500 MG TOTAL) BY MOUTH 2 (TWO) TIMES DAILY. FOR 3 DAYS WITH OUTBREAKS (Patient taking differently: Take 500 mg by mouth 2 (two) times daily.) 60 tablet 2  ? allopurinol (ZYLOPRIM) 100 MG tablet Take 1 tablet (100 mg total) by mouth daily. 90 tablet 3  ? ?No current facility-administered medications for this visit.  ? ?  ?Objective:  ?BP 116/80   Pulse 79   Temp 98.5 ?F (36.9 ?C)   Ht 5\' 10"  (1.778 m)   Wt 260 lb 12.8 oz (118.3 kg)   SpO2 96%   BMI 37.42 kg/m?  ?Gen: NAD, resting comfortably ?CV: RRR no murmurs rubs or gallops ?Lungs: CTAB no crackles, wheeze, rhonchi ?Ext: no edema ?Skin: warm, dry ? ?  ? ?Assessment and Plan  ? ?#  ED F/U for foot pain  due to gout ?S:Pt was seen on 07/08/2021 for bilateral foot pain that happened two days before visit. Pain was presented on the right great roe and dorsal surface of left lateral food. He denied any injury to either foot. Pt has a hx of gout and stated it seemed similar. He also reported that he does not typically have gout bilaterally however had never had gout in the dorsal surface of the foot before which was concerning for him. ?-Left foot x-ray was negative for any acute bony abnormality.  Suspect gout flareup in bilateral feet.   ?-Treated with colchicine  ? ?He reports has been out of allopurinol for sometime and having more regular flares at least every few months ?Lab Results  ?Component Value Date  ? LABURIC 9.3 (H) 06/16/2019  ?A/P: poor control of uric acid in past and later started on allopurinol 100mg - update  uric acid and then trend levels at next visit  ? ?#hypertension ?S: controlled on amlodipine 5 mg and losartan 50 mg- rx through Dr. Sharyn Lull ?BP Readings from Last 3 Encounters:  ?12/21/21 116/80  ?09/08/21 (!) 163/101  ?07/08/21 125/82  ?A/P: Controlled. Continue current medications.  ? ?#atherosclerosis- nonobstructive- denies CP or SOB ?#hyperlipidemia ?S: Medication: atorvastatin 20 mg (just started yesterday) ?Lab Results  ?Component Value Date  ? CHOL 205 (H) 03/02/2019  ? HDL 39.90 03/02/2019  ? LDLCALC 136 (H) 03/02/2019  ? TRIG 145.0 03/02/2019  ? CHOLHDL 5 03/02/2019  ? A/P: cholesterol checked recently and started on meds yesterday- we will get copy of labs and consider repeat or getting copy of labs when redone with Dr. Sharyn Lull ?-cath reassuring in 2020 overall- nonobstructive CAD  ?"Prox RCA to Mid RCA lesion is 20% stenosed. ?The left ventricular systolic function is normal. ?The left ventricular ejection fraction is 50-55% by visual estimate. " ?Nonobstructive cad asymptomatic- continue to monitor and continue current meds- dr. Sharyn Lull has not said he needed aspirin ? ?# history of cocaine abuse- now in remission again ?S: had been clean 18 months but had relapse- now clean for 4 months  ?A/P: back in remission- congratulated him. Back in NA regularly- did step work this morning before he came in.  ? ?# GERD ?S:believes he is on omeprazole 40mg - or another PPI through Dr. Dulce Sellar- no issues as long as on meds  ?A/P: reasonable control- continue current meds  ? ?# polyuria/frequent thirst ?- issues for last year- we will check urine, psa, and a1c ?- no burning with peeing or penile discharge. Same partner- does not desire std testing. Together 4 years ? ?#HM ?Wants screening PSA - discussed standard would be 55 but wiling to do early ?Thirsty and pees a lot as above - worries about diabetes ?Discussed importance of colonoscopy- refer to [facility] today  ? ?#insomnia- basically sleeps 12- 3 then wakes up and  hard to get back to sleep.  ?- using phone before bed- advised against ?- also try melatonin 1-3 mg ?- has OSA- refer back to pulmonology. Wearing CPAP but feels may need to be adjusted ?  ?Recommended follow up: No follow-ups on file. ?No future appointments. ? ? ?Lab/Order associations: ?  ICD-10-CM   ?1. Primary hypertension  I10 CBC with Differential/Platelet  ?  Comprehensive metabolic panel  ?  ?2. Atherosclerosis of native coronary artery of native heart without angina pectoris  I25.10   ?  ?3. Polyuria  R35.89 PSA  ?  Hemoglobin A1c  ?  POCT Urinalysis Dipstick (Automated)  ?  ?4. Screening for prostate cancer  Z12.5 PSA  ?  ?5. Idiopathic gout of right foot, unspecified chronicity  M10.071 Uric acid  ?  ?6. Elevated uric acid in blood  E79.0 allopurinol (ZYLOPRIM) 100 MG tablet  ?  ?7. Screen for colon cancer  Z12.11 Ambulatory referral to Gastroenterology  ?  CANCELED: Ambulatory referral to Gastroenterology  ?  ?8. OSA (obstructive sleep apnea)  G47.33 Ambulatory referral to Pulmonology  ?  ? ? ?Meds ordered this encounter  ?Medications  ? allopurinol (ZYLOPRIM) 100 MG tablet  ?  Sig: Take 1 tablet (100 mg total) by mouth daily.  ?  Dispense:  90 tablet  ?  Refill:  3  ? ?I,Jada Bradford,acting as a scribe for Tana Conch, MD.,have documented all relevant documentation on the behalf of Tana Conch, MD,as directed by  Tana Conch, MD while in the presence of Tana Conch, MD. ? ? ?I, Tana Conch, MD, have reviewed all documentation for this visit. The documentation on 12/21/21 for the exam, diagnosis, procedures, and orders are all accurate and complete. ? ? ?Return precautions advised.  ?Tana Conch, MD ? ? ?

## 2021-12-21 ENCOUNTER — Encounter: Payer: Self-pay | Admitting: Family Medicine

## 2021-12-21 ENCOUNTER — Ambulatory Visit: Payer: Commercial Managed Care - PPO | Admitting: Family Medicine

## 2021-12-21 VITALS — BP 116/80 | HR 79 | Temp 98.5°F | Ht 70.0 in | Wt 260.8 lb

## 2021-12-21 DIAGNOSIS — I10 Essential (primary) hypertension: Secondary | ICD-10-CM | POA: Diagnosis not present

## 2021-12-21 DIAGNOSIS — M10071 Idiopathic gout, right ankle and foot: Secondary | ICD-10-CM

## 2021-12-21 DIAGNOSIS — Z125 Encounter for screening for malignant neoplasm of prostate: Secondary | ICD-10-CM | POA: Diagnosis not present

## 2021-12-21 DIAGNOSIS — R0609 Other forms of dyspnea: Secondary | ICD-10-CM

## 2021-12-21 DIAGNOSIS — R739 Hyperglycemia, unspecified: Secondary | ICD-10-CM | POA: Insufficient documentation

## 2021-12-21 DIAGNOSIS — I2510 Atherosclerotic heart disease of native coronary artery without angina pectoris: Secondary | ICD-10-CM | POA: Insufficient documentation

## 2021-12-21 DIAGNOSIS — Z1211 Encounter for screening for malignant neoplasm of colon: Secondary | ICD-10-CM

## 2021-12-21 DIAGNOSIS — R3589 Other polyuria: Secondary | ICD-10-CM | POA: Diagnosis not present

## 2021-12-21 DIAGNOSIS — E790 Hyperuricemia without signs of inflammatory arthritis and tophaceous disease: Secondary | ICD-10-CM

## 2021-12-21 DIAGNOSIS — G4733 Obstructive sleep apnea (adult) (pediatric): Secondary | ICD-10-CM

## 2021-12-21 LAB — COMPREHENSIVE METABOLIC PANEL
ALT: 21 U/L (ref 0–53)
AST: 16 U/L (ref 0–37)
Albumin: 4.6 g/dL (ref 3.5–5.2)
Alkaline Phosphatase: 58 U/L (ref 39–117)
BUN: 15 mg/dL (ref 6–23)
CO2: 30 mEq/L (ref 19–32)
Calcium: 9.9 mg/dL (ref 8.4–10.5)
Chloride: 103 mEq/L (ref 96–112)
Creatinine, Ser: 1.04 mg/dL (ref 0.40–1.50)
GFR: 86.82 mL/min (ref >60.00)
Glucose, Bld: 87 mg/dL (ref 70–99)
Potassium: 4 mEq/L (ref 3.5–5.1)
Sodium: 140 mEq/L (ref 135–145)
Total Bilirubin: 0.6 mg/dL (ref 0.2–1.2)
Total Protein: 7.3 g/dL (ref 6.0–8.3)

## 2021-12-21 LAB — POC URINALSYSI DIPSTICK (AUTOMATED)
Bilirubin, UA: NEGATIVE
Blood, UA: NEGATIVE
Glucose, UA: NEGATIVE
Ketones, UA: NEGATIVE
Leukocytes, UA: NEGATIVE
Nitrite, UA: NEGATIVE
Protein, UA: POSITIVE — AB
Spec Grav, UA: 1.025 (ref 1.010–1.025)
Urobilinogen, UA: 0.2 E.U./dL
pH, UA: 6 (ref 5.0–8.0)

## 2021-12-21 LAB — CBC WITH DIFFERENTIAL/PLATELET
Basophils Absolute: 0 10*3/uL (ref 0.0–0.1)
Basophils Relative: 0.6 % (ref 0.0–3.0)
Eosinophils Absolute: 0.1 10*3/uL (ref 0.0–0.7)
Eosinophils Relative: 2.2 % (ref 0.0–5.0)
HCT: 44.4 % (ref 39.0–52.0)
Hemoglobin: 14.6 g/dL (ref 13.0–17.0)
Lymphocytes Relative: 25.9 % (ref 12.0–46.0)
Lymphs Abs: 1.8 10*3/uL (ref 0.7–4.0)
MCHC: 33 g/dL (ref 30.0–36.0)
MCV: 89.8 fl (ref 78.0–100.0)
Monocytes Absolute: 0.5 10*3/uL (ref 0.1–1.0)
Monocytes Relative: 7 % (ref 3.0–12.0)
Neutro Abs: 4.4 10*3/uL (ref 1.4–7.7)
Neutrophils Relative %: 64.3 % (ref 43.0–77.0)
Platelets: 245 10*3/uL (ref 150.0–400.0)
RBC: 4.94 Mil/uL (ref 4.22–5.81)
RDW: 14.4 % (ref 11.5–15.5)
WBC: 6.8 10*3/uL (ref 4.0–10.5)

## 2021-12-21 LAB — PSA: PSA: 0.16 ng/mL (ref 0.10–4.00)

## 2021-12-21 LAB — HEMOGLOBIN A1C: Hgb A1c MFr Bld: 6 % (ref 4.6–6.5)

## 2021-12-21 LAB — URIC ACID: Uric Acid, Serum: 5.9 mg/dL (ref 4.0–7.8)

## 2021-12-21 MED ORDER — ALLOPURINOL 100 MG PO TABS: 100.0000 mg | ORAL_TABLET | Freq: Every day | ORAL | 3 refills | Status: DC

## 2021-12-21 NOTE — Patient Instructions (Addendum)
Call [facility] to schedule your colonoscopy with Dr. Dulce Sellar and have them fax a copy to [place] at [phone]. ? ?Sign release of information at the check out desk for labs from Dr. Sharyn Lull and last office note ? ?Please stop by lab before you go ?If you have mychart- we will send your results within 3 business days of [place] receiving them.  ?If you do not have mychart- we will call you about results within 5 business days of [place] receiving them.  ?*please also note that you will see labs on mychart as soon as they post. I will later go in and write notes on them- will say "notes from Dr. Durene Cal"  ? ? ?Recommended follow up: Return in about 6 months (around 06/23/2022) for physical or sooner if needed.  ? ? ? ?

## 2021-12-22 ENCOUNTER — Other Ambulatory Visit: Payer: Self-pay | Admitting: Family Medicine

## 2021-12-24 MED ORDER — TRAZODONE HCL 50 MG PO TABS: 100.0000 mg | ORAL_TABLET | Freq: Every evening | ORAL | 3 refills | Status: DC | PRN

## 2021-12-28 ENCOUNTER — Telehealth: Payer: Self-pay | Admitting: *Deleted

## 2021-12-28 NOTE — Telephone Encounter (Signed)
Patient called to get lab results. Result note read to patient. Patient verbalized understanding. Patient questioned if mounjaro would be good for him to start since he is prediabetic. Patient aware that Dr. Durene Cal is out of the office until Monday. Will send message, but may not get a response until next week.  ?

## 2021-12-31 ENCOUNTER — Other Ambulatory Visit: Payer: Self-pay | Admitting: Family Medicine

## 2021-12-31 NOTE — Telephone Encounter (Signed)
Mounjaro may be helpful but it is generally only helpful short-term like Ozempic-once he stopped the medication people tend to regain most of the weight they lost from my understanding-more helpful would be consistent healthy eating/regular exercise over the long run.  If he is doing well with these 2 in the future and still struggling to lose weight we can certainly come back to this conversation about Mounjaro ?

## 2021-12-31 NOTE — Telephone Encounter (Signed)
Mychart message sent to patient.

## 2022-01-07 NOTE — Progress Notes (Addendum)
? ?Phone [phone] ?Virtual visit via Video note ?  ?Subjective:  ?Chief complaint: ?Chief Complaint  ?Patient presents with  ? Follow-up  ?  Pt is wanting to go over a1c and protein in urine results.   ? ? ?This visit type was conducted due to national recommendations for restrictions regarding the COVID-19 Pandemic (e.g. social distancing).  This format is felt to be most appropriate for this patient at this time balancing risks to patient and risks to population by having him in for in person visit.  No physical exam was performed (except for noted visual exam or audio findings with Telehealth visits).   ? ?Our team/I connected with Gabriel Garcia at  4:00 PM EDT by a video enabled telemedicine application (doxy.me or caregility through epic) and verified that I am speaking with the correct person using two identifiers.  ?Location patient: Home[place] ?Location provider: [facility], office ?Persons participating in the virtual visit:  patient ? ?Our team/I discussed the limitations of evaluation and management by telemedicine and the availability of in person appointments. In light of current covid-19 pandemic, patient also understands that we are trying to protect them by minimizing in office contact if at all possible.  The patient expressed consent for telemedicine visit and agreed to proceed. Patient understands insurance will be billed.  ? ?Past Medical History-  ?Patient Active Problem List  ? Diagnosis Date Noted  ? Coronary atherosclerosis 12/21/2021  ?  Priority: High  ? COVID-19 virus infection 05/01/2019  ?  Priority: High  ? Upper GI bleed 08/31/2018  ?  Priority: High  ? Mallory-Weiss tear 12/03/2016  ?  Priority: High  ? Cocaine abuse in remission ([place]) 11/16/2015  ?  Priority: High  ? Alcoholism ([place]) 11/16/2015  ?  Priority: High  ? Hepatitis C   ?  Priority: High  ? Hyperglycemia 12/21/2021  ?  Priority: Medium   ? Hyperlipidemia, unspecified 03/02/2019  ?  Priority: Medium   ? Genital  herpes 09/12/2016  ?  Priority: Medium   ? OSA (obstructive sleep apnea) 05/28/2016  ?  Priority: Medium   ? Gout 11/16/2015  ?  Priority: Medium   ? Acute duodenal ulcer with hemorrhage 10/12/2014  ?  Priority: Medium   ? Hypertension 10/06/2014  ?  Priority: Medium   ? Low back pain 03/02/2019  ?  Priority: Low  ? BPPV (benign paroxysmal positional vertigo) 09/12/2016  ?  Priority: Low  ? Obese 11/17/2015  ?  Priority: Low  ? Legally blind in right eye, as defined in [place]   ?  Priority: Low  ? EKG abnormality 10/12/2014  ?  Priority: Low  ? Esophageal reflux 11/24/2013  ?  Priority: Low  ? Atypical chest pain 06/18/2018  ? ? ?Medications- reviewed and updated ?Current Outpatient Medications  ?Medication Sig Dispense Refill  ? allopurinol (ZYLOPRIM) 100 MG tablet Take 1 tablet (100 mg total) by mouth daily. 90 tablet 3  ? amLODipine (NORVASC) 5 MG tablet Take 5 mg by mouth daily.    ? atorvastatin (LIPITOR) 20 MG tablet Take 20 mg by mouth daily.    ? losartan (COZAAR) 50 MG tablet Take 100 mg by mouth daily.    ? omeprazole (PRILOSEC) 40 MG capsule TAKE 1 CAPSULE BY MOUTH EVERY DAY (Patient taking differently: Take 40 mg by mouth daily.) 90 capsule 1  ? traZODone (DESYREL) 50 MG tablet TAKE 2 TABLETS (100 MG TOTAL) BY MOUTH AT BEDTIME AS NEEDED FOR SLEEP. 180 tablet  1  ? valACYclovir (VALTREX) 500 MG tablet TAKE 1 TABLET (500 MG TOTAL) BY MOUTH 2 (TWO) TIMES DAILY. FOR 3 DAYS WITH OUTBREAKS (Patient taking differently: Take 500 mg by mouth 2 (two) times daily.) 60 tablet 2  ? ?No current facility-administered medications for this visit.  ? ?  ?Objective:  ?Ht 5\' 10"  (1.778 m)   Wt 255 lb (115.7 kg)   BMI 36.59 kg/m?  self reported vitals ?Gen: NAD, resting comfortably ?Lungs: nonlabored, normal respiratory rate  ?Skin: appears dry, no obvious rash ? ?  ? ?Assessment and Plan  ? ?# Hyperglycemia/insulin resistance/prediabetes ?S:  Medication: none ?Exercise and diet-  has improved diet and hit the gym at [employer]- down 13 lbs. Has cut out sodas. Urinary frequency improving.  ?Lab Results  ?Component Value Date  ? HGBA1C 6.0 12/21/2021  ? HGBA1C 5.6 06/18/2018  ? HGBA1C 5.3 10/04/2016  ?A/P: Patient has made significant strides at home and down 13 pounds-we discussed diagnosis of prediabetes and how his current lifestyle changes are going to be very beneficial-consider repeat in 6 weeks and continue current efforts ? ?# polyuria/frequent thirst ?S: No obvious trigger despite recent urinalysis, PSA, A1c ?-Symptoms have improved some with cutting sugar down ?A/P: No obvious cause on initial work-up-he is monogamous but did agree to at least gonorrhea and chlamydia screening to see if this could be contributing to his symptoms.  Also check urinalysis with reflex due to prior proteinuria-we discussed if any worsening considering urine microalbumin to creatinine ratio ?-PSA was so low may need think prostatitis unlikely-definitely points away from prostate cancer ?-We will get urine culture when he comes back for labs to more definitively rule out cystitis ? ? ?Recommended follow up:  ?Future Appointments  ?Date Time Provider [place]  ?07/02/2022 10:00 AM Dwayne Begay, Brayton Mars, MD [facility]  ? ? ?Lab/Order associations: ?  ICD-10-CM   ?1. Prediabetes  R73.03   ?  ?2. Polyuria  R35.89 Urine Culture  ?  Urinalysis, Routine w reflex microscopic  ?  ?3. Screen for colon cancer  Z12.11 Ambulatory referral to Gastroenterology  ?  ?4. Screening for gonorrhea  Z11.3 Urine cytology ancillary only  ?  CANCELED: DRUG MONITORING, PANEL 8 WITH CONFIRMATION, URINE  ?  ?5. Screening for chlamydial disease  Z11.8 Urine cytology ancillary only  ?  CANCELED: DRUG MONITORING, PANEL 8 WITH CONFIRMATION, URINE  ?  ? ?Time Spent: ?21 minutes of total time (4:40 PM-5:01 PM) was spent on the date of the encounter performing the following actions: chart review prior to seeing the patient, obtaining history, counseling on the treatment plan  as well as meaning of diagnosis particularly diabetes and how to address that, placing orders, and documenting in our EHR.  ? ? ?I,Jada Bradford,acting as a scribe for Garret Reddish, MD.,have documented all relevant documentation on the behalf of Garret Reddish, MD,as directed by  Garret Reddish, MD while in the presence of Garret Reddish, MD. ? ? I, Garret Reddish, MD, have reviewed all documentation for this visit. The documentation on 01/08/22 for the exam, diagnosis, procedures, and orders are all accurate and complete.  ? ?Return precautions advised.  ?Garret Reddish, MD ? ? ?

## 2022-01-08 ENCOUNTER — Telehealth (INDEPENDENT_AMBULATORY_CARE_PROVIDER_SITE_OTHER): Payer: Commercial Managed Care - PPO | Admitting: Family Medicine

## 2022-01-08 ENCOUNTER — Encounter: Payer: Self-pay | Admitting: Family Medicine

## 2022-01-08 VITALS — Ht 70.0 in | Wt 255.0 lb

## 2022-01-08 DIAGNOSIS — Z113 Encounter for screening for infections with a predominantly sexual mode of transmission: Secondary | ICD-10-CM

## 2022-01-08 DIAGNOSIS — Z1211 Encounter for screening for malignant neoplasm of colon: Secondary | ICD-10-CM | POA: Diagnosis not present

## 2022-01-08 DIAGNOSIS — R3589 Other polyuria: Secondary | ICD-10-CM | POA: Diagnosis not present

## 2022-01-08 DIAGNOSIS — R7303 Prediabetes: Secondary | ICD-10-CM | POA: Diagnosis not present

## 2022-01-08 DIAGNOSIS — E785 Hyperlipidemia, unspecified: Secondary | ICD-10-CM

## 2022-01-08 DIAGNOSIS — Z118 Encounter for screening for other infectious and parasitic diseases: Secondary | ICD-10-CM

## 2022-01-08 DIAGNOSIS — I10 Essential (primary) hypertension: Secondary | ICD-10-CM

## 2022-01-08 DIAGNOSIS — M10071 Idiopathic gout, right ankle and foot: Secondary | ICD-10-CM

## 2022-01-08 DIAGNOSIS — I2510 Atherosclerotic heart disease of native coronary artery without angina pectoris: Secondary | ICD-10-CM

## 2022-01-28 ENCOUNTER — Other Ambulatory Visit (HOSPITAL_COMMUNITY)
Admission: RE | Admit: 2022-01-28 | Discharge: 2022-01-28 | Disposition: A | Discharge status: Home / Self Care | Payer: Commercial Managed Care - PPO | Source: Ambulatory Visit | Attending: Family Medicine | Admitting: Family Medicine

## 2022-01-28 ENCOUNTER — Other Ambulatory Visit (INDEPENDENT_AMBULATORY_CARE_PROVIDER_SITE_OTHER): Payer: Commercial Managed Care - PPO

## 2022-01-28 DIAGNOSIS — R3589 Other polyuria: Secondary | ICD-10-CM | POA: Diagnosis not present

## 2022-01-28 DIAGNOSIS — Z118 Encounter for screening for other infectious and parasitic diseases: Secondary | ICD-10-CM | POA: Diagnosis present

## 2022-01-28 DIAGNOSIS — Z113 Encounter for screening for infections with a predominantly sexual mode of transmission: Secondary | ICD-10-CM

## 2022-01-28 LAB — URINALYSIS, ROUTINE W REFLEX MICROSCOPIC
Bilirubin Urine: NEGATIVE
Hgb urine dipstick: NEGATIVE
Ketones, ur: NEGATIVE
Leukocytes,Ua: NEGATIVE
Nitrite: NEGATIVE
RBC / HPF: NONE SEEN (ref 0–?)
Specific Gravity, Urine: 1.02 (ref 1.000–1.030)
Total Protein, Urine: NEGATIVE
Urine Glucose: NEGATIVE
Urobilinogen, UA: 0.2 (ref 0.0–1.0)
pH: 5.5 (ref 5.0–8.0)

## 2022-01-29 LAB — URINE CYTOLOGY ANCILLARY ONLY
Chlamydia: NEGATIVE
Comment: NEGATIVE
Comment: NEGATIVE
Comment: NORMAL
Neisseria Gonorrhea: NEGATIVE
Trichomonas: NEGATIVE

## 2022-01-29 LAB — URINE CULTURE
MICRO NUMBER:: 13272160
Result:: NO GROWTH
SPECIMEN QUALITY:: ADEQUATE

## 2022-01-31 ENCOUNTER — Encounter: Payer: Self-pay | Admitting: Primary Care

## 2022-01-31 ENCOUNTER — Ambulatory Visit (INDEPENDENT_AMBULATORY_CARE_PROVIDER_SITE_OTHER): Payer: Commercial Managed Care - PPO | Admitting: Primary Care

## 2022-01-31 VITALS — BP 128/88 | HR 70 | Temp 98.1°F | Ht 72.0 in | Wt 251.0 lb

## 2022-01-31 DIAGNOSIS — G4733 Obstructive sleep apnea (adult) (pediatric): Secondary | ICD-10-CM | POA: Diagnosis not present

## 2022-01-31 DIAGNOSIS — G4730 Sleep apnea, unspecified: Secondary | ICD-10-CM | POA: Diagnosis not present

## 2022-01-31 NOTE — Progress Notes (Signed)
? ?@Patient  ID: Devoria Albe, male    DOB: 1975-10-19, 46 y.o.   MRN: [id number] ? ?Chief Complaint  ?Patient presents with  ? Consult  ? ? ?Referring provider: ?Marin Olp, MD ? ?HPI: ?46 year old male, never smoked.  Past medical history significant for hypertension, coronary arthrosclerosis, OSA, hep C, GERD. ? ?01/31/2022 ?Patient presents today for sleep consult. He has history severe sleep apnea. Hst 12/01/2015 >>AHI 64/hr with SpO2 low 76% (average 91%). He is on auto CPAP 5-15cm h20. He reports compliance with CPAP. Feels he needs new machine, states that it is no working as well as it had previously. Not as effective. He has been noticing more snoring. He uses a nasal mask. DME company is [employer]. No significant daytime sleepiness.  ? 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 ?Airview download 09/02/20-10/01/20 ?Usage 30/30 days used (100%); 29 days (97%) ?Average usage 9 hours 29 mins ?Pressure 5-15cm h20 ?Airleaks 9.5L/min ?AHI 3.3 ? ?No Known Allergies ? ?Immunization History  ?Administered Date(s) Administered  ? Influenza,inj,Quad PF,6+ Mos 09/02/2018  ? Tdap 05/25/2007, 10/15/2012  ? ? ?Past Medical History:  ?Diagnosis Date  ? Acute duodenal ulcer with hemorrhage 10/12/2014  ? GERD (gastroesophageal reflux disease)   ? GI bleed   ? Gout   ? Hepatitis C   ? Hypertension   ? Legal blindness of right eye, as defined in [place]  ? Legally blind in right eye, as defined in [place]   ? Mesenteric adenitis   ? Sliding hiatal hernia   ? Substance abuse ([place])   ? cocaine  ? 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 ? ?Tobacco History: ?Social History  ? ?Tobacco Use  ?Smoking Status Never  ?Smokeless Tobacco Never  ? ?Counseling  given: Not Answered ? ? ?Outpatient Medications Prior to Visit  ?Medication Sig Dispense Refill  ? allopurinol (ZYLOPRIM) 100 MG tablet Take 1 tablet (100 mg total) by mouth daily. 90 tablet 3  ? amLODipine (NORVASC) 5 MG tablet Take 5 mg by mouth daily.    ? atorvastatin (LIPITOR) 20 MG tablet Take 20 mg by mouth daily.    ? losartan (COZAAR) 50 MG tablet Take 100 mg by mouth daily.    ? omeprazole (PRILOSEC) 40 MG capsule TAKE 1 CAPSULE BY MOUTH EVERY DAY (Patient taking differently: Take 40 mg by mouth daily.) 90 capsule 1  ? traZODone (DESYREL) 50 MG tablet TAKE 2 TABLETS (100 MG TOTAL) BY MOUTH AT BEDTIME AS NEEDED FOR SLEEP. 180 tablet 1  ? valACYclovir (VALTREX) 500 MG tablet TAKE 1 TABLET (500 MG TOTAL) BY MOUTH 2 (TWO) TIMES DAILY. FOR 3 DAYS WITH OUTBREAKS (Patient taking differently: Take 500 mg by mouth 2 (two) times daily.) 60 tablet 2  ? ?No facility-administered medications prior to visit.  ? ?Review of Systems ? ?Review of Systems  ?Constitutional: Negative.   ?HENT: Negative.    ?Respiratory:  Positive for apnea.   ?Cardiovascular: Negative.   ?Psychiatric/Behavioral:  Positive for sleep disturbance.   ? ? ?Physical Exam ? ?BP 128/88 (BP Location: Right Arm, Patient Position: Sitting, Cuff Size: Normal)   Pulse 70   Temp 98.1 ?F (36.7 ?C) (Oral)   Ht 6' (1.829  m)   Wt 251 lb (113.9 kg)   SpO2 99%   BMI 34.04 kg/m?  ?Physical Exam ?Constitutional:   ?   General: He is not in acute distress. ?   Appearance: Normal appearance. He is not ill-appearing.  ?HENT:  ?   Head: Normocephalic and atraumatic.  ?   Mouth/Throat:  ?   Mouth: Mucous membranes are moist.  ?   Pharynx: Oropharynx is clear.  ?Cardiovascular:  ?   Rate and Rhythm: Normal rate and regular rhythm.  ?Pulmonary:  ?   Effort: Pulmonary effort is normal.  ?   Breath sounds: Normal breath sounds. No wheezing or rhonchi.  ?Musculoskeletal:     ?   General: Normal range of motion.  ?Skin: ?   General: Skin is warm and dry.  ?Neurological:   ?   General: No focal deficit present.  ?   Mental Status: He is alert and oriented to person, place, and time. Mental status is at baseline.  ?Psychiatric:     ?   Mood and Affect: Mood normal.     ?   Behavior: Behavior normal.     ?   Thought Content: Thought content normal.     ?   Judgment: Judgment normal.  ?  ? ?Lab Results: ? ?CBC ?   ?Component Value Date/Time  ? WBC 6.8 12/21/2021 0931  ? RBC 4.94 12/21/2021 0931  ? HGB 14.6 12/21/2021 0931  ? HCT 44.4 12/21/2021 0931  ? PLT 245.0 12/21/2021 0931  ? MCV 89.8 12/21/2021 0931  ? MCH 30.2 03/02/2021 0653  ? MCHC 33.0 12/21/2021 0931  ? RDW 14.4 12/21/2021 0931  ? LYMPHSABS 1.8 12/21/2021 0931  ? MONOABS 0.5 12/21/2021 0931  ? EOSABS 0.1 12/21/2021 0931  ? BASOSABS 0.0 12/21/2021 0931  ? ? ?BMET ?   ?Component Value Date/Time  ? NA 140 12/21/2021 0931  ? K 4.0 12/21/2021 0931  ? CL 103 12/21/2021 0931  ? CO2 30 12/21/2021 0931  ? GLUCOSE 87 12/21/2021 0931  ? BUN 15 12/21/2021 0931  ? CREATININE 1.04 12/21/2021 0931  ? CALCIUM 9.9 12/21/2021 0931  ? GFRNONAA >60 03/02/2021 0653  ? GFRAA >60 03/02/2020 1735  ? ? ?BNP ?   ?Component Value Date/Time  ? BNP 16.8 06/18/2018 1926  ? ? ?ProBNP ?No results found for: PROBNP ? ?Imaging: ?No results found. ? ? ?Assessment & Plan:  ? ?OSA (obstructive sleep apnea) ?- Patient has severe OSA. HST 12/01/2015 >>AHI 64/hr with SpO2 low 76% (average 91%). He is on auto CPAP 5-15cm h20. Having more snoring symptoms. Needs new CPAP machine/renew supplies, order has been place at current settings. Compliance report from 2021 showed 100% usage. We have contacted [employer] to re-enroll him in [place] or provide him with SD card. FU in 3 months or sooner if needed. ? ? ?Martyn Ehrich, NP ?01/31/2022 ? ?

## 2022-01-31 NOTE — Assessment & Plan Note (Addendum)
-   Patient has severe OSA. HST 12/01/2015 >>AHI 64/hr with SpO2 low 76% (average 91%). He is on auto CPAP 5-15cm h20. Having more snoring symptoms. Needs new CPAP machine/renew supplies, order has been place at current settings. Compliance report from 2021 showed 100% usage. We have contacted [employer] to re-enroll him in [place] or provide him with SD card. FU in 3 months or sooner if needed. ?

## 2022-01-31 NOTE — Progress Notes (Signed)
Reviewed and agree with assessment/plan. ? ? ?Chesley Mires, MD ?[place] ?01/31/2022, 10:24 AM ?Pager:  [phone] ? ?

## 2022-01-31 NOTE — Patient Instructions (Signed)
Recommendations: ?Continue to wear CPAP every night for 4 to 6 hours or longer ?Do not drive if experiencing excessive daytime sleepiness or fatigue ?Continue to work on weight loss efforts ? ?Orders ?New CPAP machine auto setting 5 to 15 cm H2O/new supplies (ordered) ?Please get compliance report from [facility] ? ?Follow-up ?3 months with Gabriel Maize NP in office ? ?CPAP and BIPAP Information ?CPAP and BIPAP are methods that use air pressure to keep your airways open and to help you breathe well. CPAP and BIPAP use different amounts of pressure. Your health care provider will tell you whether CPAP or BIPAP would be more helpful for you. ?CPAP stands for "continuous positive airway pressure." With CPAP, the amount of pressure stays the same while you breathe in (inhale) and out (exhale). ?BIPAP stands for "bi-level positive airway pressure." With BIPAP, the amount of pressure will be higher when you inhale and lower when you exhale. This allows you to take larger breaths. ?CPAP or BIPAP may be used in the hospital, or your health care provider may want you to use it at home. You may need to have a sleep study before your health care provider can order a machine for you to use at home. ?What are the advantages? ?CPAP or BIPAP can be helpful if you have: ?Sleep apnea. ?Chronic obstructive pulmonary disease (COPD). ?Heart failure. ?Medical conditions that cause muscle weakness, including muscular dystrophy or amyotrophic lateral sclerosis (ALS). ?Other problems that cause breathing to be shallow, weak, abnormal, or difficult. ?CPAP and BIPAP are most commonly used for obstructive sleep apnea (OSA) to keep the airways from collapsing when the muscles relax during sleep. ?What are the risks? ?Generally, this is a safe treatment. However, problems may occur, including: ?Irritated skin or skin sores if the mask does not fit properly. ?Dry or stuffy nose or nosebleeds. ?Dry mouth. ?Feeling gassy or bloated. ?Sinus or lung infection  if the equipment is not cleaned properly. ?When should CPAP or BIPAP be used? ?In most cases, the mask only needs to be worn during sleep. Generally, the mask needs to be worn throughout the night and during any daytime naps. People with certain medical conditions may also need to wear the mask at other times, such as when they are awake. Follow instructions from your health care provider about when to use the machine. ?What happens during CPAP or BIPAP? ? ?Both CPAP and BIPAP are provided by a small machine with a flexible plastic tube that attaches to a plastic mask that you wear. Air is blown through the mask into your nose or mouth. The amount of pressure that is used to blow the air can be adjusted on the machine. Your health care provider will set the pressure setting and help you find the best mask for you. ?Tips for using the mask ?Because the mask needs to be snug, some people feel trapped or closed-in (claustrophobic) when first using the mask. If you feel this way, you may need to get used to the mask. One way to do this is to hold the mask loosely over your nose or mouth and then gradually apply the mask more snugly. You can also gradually increase the amount of time that you use the mask. ?Masks are available in various types and sizes. If your mask does not fit well, talk with your health care provider about getting a different one. Some common types of masks include: ?Full face masks, which fit over the mouth and nose. ?Nasal masks, which fit  over the nose. ?Nasal pillow or prong masks, which fit into the nostrils. ?If you are using a mask that fits over your nose and you tend to breathe through your mouth, a chin strap may be applied to help keep your mouth closed. ?Use a skin barrier to protect your skin as told by your health care provider. ?Some CPAP and BIPAP machines have alarms that may sound if the mask comes off or develops a leak. ?If you have trouble with the mask, it is very important that  you talk with your health care provider about finding a way to make the mask easier to tolerate. Do not stop using the mask. There could be a negative impact on your health if you stop using the mask. ?Tips for using the machine ?Place your CPAP or BIPAP machine on a secure table or stand near an electrical outlet. ?Know where the on/off switch is on the machine. ?Follow instructions from your health care provider about how to set the pressure on your machine and when you should use it. ?Do not eat or drink while the CPAP or BIPAP machine is on. Food or fluids could get pushed into your lungs by the pressure of the CPAP or BIPAP. ?For home use, CPAP and BIPAP machines can be rented or purchased through home health care companies. Many different brands of machines are available. Renting a machine before purchasing may help you find out which particular machine works well for you. Your health insurance company may also decide which machine you may get. ?Keep the CPAP or BIPAP machine and attachments clean. Ask your health care provider for specific instructions. ?Check the humidifier if you have a dry stuffy nose or nosebleeds. Make sure it is working correctly. ?Follow these instructions at home: ?Take over-the-counter and prescription medicines only as told by your health care provider. Ask if you can take sinus medicine if your sinuses are blocked. ?Do not use any products that contain nicotine or tobacco. These products include cigarettes, chewing tobacco, and vaping devices, such as e-cigarettes. If you need help quitting, ask your health care provider. ?Keep all follow-up visits. This is important. ?Contact a health care provider if: ?You have redness or pressure sores on your head, face, mouth, or nose from the mask or head gear. ?You have trouble using the CPAP or BIPAP machine. ?You cannot tolerate wearing the CPAP or BIPAP mask. ?Someone tells you that you snore even when wearing your CPAP or BIPAP. ?Get  help right away if: ?You have trouble breathing. ?You feel confused. ?Summary ?CPAP and BIPAP are methods that use air pressure to keep your airways open and to help you breathe well. ?If you have trouble with the mask, it is very important that you talk with your health care provider about finding a way to make the mask easier to tolerate. Do not stop using the mask. There could be a negative impact to your health if you stop using the mask. ?Follow instructions from your health care provider about when to use the machine. ?This information is not intended to replace advice given to you by your health care provider. Make sure you discuss any questions you have with your health care provider. ?Document Revised: 05/09/2021 Document Reviewed: 09/08/2020 ?Elsevier Patient Education ? [place]. ? ?

## 2022-04-25 ENCOUNTER — Emergency Department (HOSPITAL_COMMUNITY): Payer: Commercial Managed Care - PPO

## 2022-04-25 ENCOUNTER — Encounter (HOSPITAL_COMMUNITY): Payer: Self-pay | Admitting: Emergency Medicine

## 2022-04-25 ENCOUNTER — Emergency Department (HOSPITAL_COMMUNITY)
Admission: EM | Admit: 2022-04-25 | Discharge: 2022-04-25 | Disposition: A | Discharge status: Home / Self Care | Payer: Commercial Managed Care - PPO | Attending: Emergency Medicine | Admitting: Emergency Medicine

## 2022-04-25 ENCOUNTER — Other Ambulatory Visit: Payer: Self-pay

## 2022-04-25 DIAGNOSIS — Z79899 Other long term (current) drug therapy: Secondary | ICD-10-CM | POA: Insufficient documentation

## 2022-04-25 DIAGNOSIS — M5412 Radiculopathy, cervical region: Secondary | ICD-10-CM | POA: Insufficient documentation

## 2022-04-25 DIAGNOSIS — I10 Essential (primary) hypertension: Secondary | ICD-10-CM | POA: Insufficient documentation

## 2022-04-25 DIAGNOSIS — M25511 Pain in right shoulder: Secondary | ICD-10-CM

## 2022-04-25 DIAGNOSIS — X500XXA Overexertion from strenuous movement or load, initial encounter: Secondary | ICD-10-CM | POA: Insufficient documentation

## 2022-04-25 DIAGNOSIS — M25512 Pain in left shoulder: Secondary | ICD-10-CM | POA: Diagnosis present

## 2022-04-25 MED ORDER — MELOXICAM 15 MG PO TABS: 15.0000 mg | ORAL_TABLET | Freq: Every day | ORAL | 0 refills | Status: AC

## 2022-04-25 NOTE — ED Notes (Signed)
An After Visit Summary was printed and given to the patient. Discharge instructions given and no further questions at this time.  

## 2022-04-25 NOTE — ED Triage Notes (Signed)
Patient arrives ambulatory by POV c/o left shoulder pain shooting down his arm. States yesterday evening was moving a mattress when he felt something pop in his shoulder.

## 2022-04-25 NOTE — Discharge Instructions (Signed)
You were seen today for what appears to be a soft tissue injury in the left shoulder along with possible cervical radiculopathy.  I provided education on pinched nerves.  I will provide a prescription for meloxicam, a daily anti-inflammatory.  Do not take other NSAIDs when taking this medication.  Please contact the provided orthopedic provider for a follow-up appointment.

## 2022-04-25 NOTE — ED Provider Notes (Signed)
[facility]-EMERGENCY DEPT Provider Note   CSN: [id number] Arrival date & time: 04/25/22  7564     History  Chief Complaint  Patient presents with   Shoulder Pain    Gabriel Garcia is a 46 y.o. male.  Patient presents to the hospital complaining of left shoulder pain.  Patient states he was moving a mattress last night and felt a pop and had pain in the shoulder with tingling running down the top of the shoulder from the neck down to approximately the elbow.  Patient denies any weakness at this time.  Patient also denies neck pain.  Patient endorses full range of motion of his left arm but has some pain when he abducts it.  Past medical history significant for hypertension, hep C, history of substance abuse, history of GI bleed, GERD, legally blind in right eye,   HPI     Home Medications Prior to Admission medications   Medication Sig Start Date End Date Taking? Authorizing Provider  meloxicam (MOBIC) 15 MG tablet Take 1 tablet (15 mg total) by mouth daily for 15 days. 04/25/22 05/10/22 Yes Darrick Grinder, PA-C  allopurinol (ZYLOPRIM) 100 MG tablet Take 1 tablet (100 mg total) by mouth daily. 12/21/21   [name], MD  amLODipine (NORVASC) 5 MG tablet Take 5 mg by mouth daily. 07/29/19   [provider]  atorvastatin (LIPITOR) 20 MG tablet Take 20 mg by mouth daily. 07/29/19   [provider]  losartan (COZAAR) 50 MG tablet Take 100 mg by mouth daily. 07/22/19   [provider]  omeprazole (PRILOSEC) 40 MG capsule TAKE 1 CAPSULE BY MOUTH EVERY DAY Patient taking differently: Take 40 mg by mouth daily. 08/31/19   [name], MD  traZODone (DESYREL) 50 MG tablet TAKE 2 TABLETS (100 MG TOTAL) BY MOUTH AT BEDTIME AS NEEDED FOR SLEEP. 12/31/21   [name], MD  valACYclovir (VALTREX) 500 MG tablet TAKE 1 TABLET (500 MG TOTAL) BY MOUTH 2 (TWO) TIMES DAILY. FOR 3 DAYS WITH OUTBREAKS Patient taking differently: Take 500  mg by mouth 2 (two) times daily. 10/31/20   [name], MD      Allergies    Patient has no known allergies.    Review of Systems   Review of Systems  Musculoskeletal:  Positive for arthralgias. Negative for joint swelling and neck pain.    Physical Exam Updated Vital Signs BP 92/69 (BP Location: Left Arm)   Pulse 76   Temp 97.9 F (36.6 C) (Oral)   Resp 16   Ht 5\' 10"  (1.778 m)   Wt 111.1 kg   SpO2 96%   BMI 35.15 kg/m  Physical Exam Vitals and nursing note reviewed.  Constitutional:      General: He is not in acute distress. HENT:     Head: Normocephalic and atraumatic.     Mouth/Throat:     Mouth: Mucous membranes are moist.  Eyes:     Conjunctiva/sclera: Conjunctivae normal.  Cardiovascular:     Rate and Rhythm: Normal rate and regular rhythm.     Pulses: Normal pulses.  Pulmonary:     Effort: Pulmonary effort is normal.     Breath sounds: Normal breath sounds.  Musculoskeletal:     Cervical back: Normal range of motion. No tenderness.     Comments: Patient has normal range of motion of the left shoulder but complains when abducting the left shoulder above 90 degrees patient has normal sensation  in the left arm, equal sensation on right arm.  Equal hand strength bilaterally  Skin:    Capillary Refill: Capillary refill takes less than 2 seconds.  Neurological:     Mental Status: He is alert.     ED Results / Procedures / Treatments   Labs (all labs ordered are listed, but only abnormal results are displayed) Labs Reviewed - No data to display  EKG None  Radiology DG Shoulder Left  Result Date: 04/25/2022 CLINICAL DATA:  Pain after moving maximum last night EXAM: LEFT SHOULDER - 2+ VIEW COMPARISON:  None Available. FINDINGS: There is no evidence of fracture or dislocation. There is no evidence of arthropathy or other focal bone abnormality. Soft tissues are unremarkable. IMPRESSION: No acute osseous abnormality. Electronically Signed   By: Maudry Mayhew M.D.   On: 04/25/2022 11:21    Procedures Procedures    Medications Ordered in ED Medications - No data to display  ED Course/ Medical Decision Making/ A&P                           Medical Decision Making Amount and/or Complexity of Data Reviewed Radiology: ordered.   Patient presents with pain in the left shoulder.  Differential includes but is not limited to fracture, dislocation, cervical radiculopathy, rotator cuff injury, and others  I ordered imaging including plain films of the left shoulder.  No acute fracture or dislocation was noted.  I agree with the radiologist findings.  I see no utility in labs at this time.  No fracture or dislocation noted on the x-rays which is reassuring.  Patient does have some symptoms that seem consistent with possible cervical radiculopathy,with pain "shooting" down the arm from the neck. Paitent also potentially has a mild rotator cuff injury due to the pain the patient has with abduction.  Plan to have patient follow-up with orthopedic surgery for further evaluation and management.        Final Clinical Impression(s) / ED Diagnoses Final diagnoses:  Acute pain of right shoulder  Cervical radiculopathy    Rx / DC Orders ED Discharge Orders          Ordered    meloxicam (MOBIC) 15 MG tablet  Daily        04/25/22 1148              Pamala Duffel 04/25/22 1149    Lorre Nick, MD 04/26/22 [phone]

## 2022-05-12 ENCOUNTER — Emergency Department (HOSPITAL_COMMUNITY): Payer: Commercial Managed Care - PPO

## 2022-05-12 ENCOUNTER — Other Ambulatory Visit: Payer: Self-pay

## 2022-05-12 ENCOUNTER — Encounter (HOSPITAL_COMMUNITY): Payer: Self-pay

## 2022-05-12 ENCOUNTER — Emergency Department (HOSPITAL_COMMUNITY)
Admission: EM | Admit: 2022-05-12 | Discharge: 2022-05-12 | Disposition: A | Discharge status: Home / Self Care | Payer: Commercial Managed Care - PPO | Attending: Emergency Medicine | Admitting: Emergency Medicine

## 2022-05-12 DIAGNOSIS — I10 Essential (primary) hypertension: Secondary | ICD-10-CM | POA: Insufficient documentation

## 2022-05-12 DIAGNOSIS — K219 Gastro-esophageal reflux disease without esophagitis: Secondary | ICD-10-CM | POA: Diagnosis not present

## 2022-05-12 DIAGNOSIS — I959 Hypotension, unspecified: Secondary | ICD-10-CM | POA: Diagnosis not present

## 2022-05-12 DIAGNOSIS — R5383 Other fatigue: Secondary | ICD-10-CM | POA: Diagnosis present

## 2022-05-12 DIAGNOSIS — Z79899 Other long term (current) drug therapy: Secondary | ICD-10-CM | POA: Insufficient documentation

## 2022-05-12 DIAGNOSIS — R031 Nonspecific low blood-pressure reading: Secondary | ICD-10-CM

## 2022-05-12 LAB — COMPREHENSIVE METABOLIC PANEL
ALT: 22 U/L (ref 0–44)
AST: 19 U/L (ref 15–41)
Albumin: 4.4 g/dL (ref 3.5–5.0)
Alkaline Phosphatase: 46 U/L (ref 38–126)
Anion gap: 11 (ref 5–15)
BUN: 18 mg/dL (ref 6–20)
CO2: 23 mmol/L (ref 22–32)
Calcium: 9.7 mg/dL (ref 8.9–10.3)
Chloride: 106 mmol/L (ref 98–111)
Creatinine, Ser: 1.23 mg/dL (ref 0.61–1.24)
GFR, Estimated: >60 mL/min (ref >60)
Glucose, Bld: 105 mg/dL — ABNORMAL HIGH (ref 70–99)
Potassium: 3.6 mmol/L (ref 3.5–5.1)
Sodium: 140 mmol/L (ref 135–145)
Total Bilirubin: 0.4 mg/dL (ref 0.3–1.2)
Total Protein: 7.7 g/dL (ref 6.5–8.1)

## 2022-05-12 LAB — CBC
HCT: 43.6 % (ref 39.0–52.0)
Hemoglobin: 15.2 g/dL (ref 13.0–17.0)
MCH: 31.1 pg (ref 26.0–34.0)
MCHC: 34.9 g/dL (ref 30.0–36.0)
MCV: 89.2 fL (ref 80.0–100.0)
Platelets: 264 10*3/uL (ref 150–400)
RBC: 4.89 MIL/uL (ref 4.22–5.81)
RDW: 13.3 % (ref 11.5–15.5)
WBC: 8.7 10*3/uL (ref 4.0–10.5)
nRBC: 0 % (ref 0.0–0.2)

## 2022-05-12 LAB — TROPONIN I (HIGH SENSITIVITY): Troponin I (High Sensitivity): 5 ng/L (ref <18)

## 2022-05-12 LAB — LIPASE, BLOOD: Lipase: 29 U/L (ref 11–51)

## 2022-05-12 MED ORDER — SODIUM CHLORIDE 0.9 % IV BOLUS
1000.0000 mL | Freq: Once | INTRAVENOUS | Status: AC
  Administered 2022-05-12: 1000 mL via INTRAVENOUS

## 2022-05-12 NOTE — ED Provider Triage Note (Signed)
Emergency Medicine Provider Triage Evaluation Note  Gabriel Garcia , a 46 y.o. male  was evaluated in triage.  Pt complains of fatigue for several weeks with dyspnea on exertion. Has noted his blood pressure, which is usually high, has been incredibly low as he's been checking it at home. On two different blood pressure medications, last changed 3 months ago. Has some chest soreness with coughing, no shortness of breath at rest. Has hx of GERD and esophageal rupture, has persistent indigestion and upper abd pain with eating.   Review of Systems  Positive: As above Negative: Syncope,   Physical Exam  BP 95/80 (BP Location: Right Arm)   Pulse 80   Temp 98.2 F (36.8 C) (Oral)   Resp 16   Ht 5\' 10"  (1.778 m)   Wt 108.9 kg   SpO2 98%   BMI 34.44 kg/m  Gen:   Awake, no distress   Resp:  Normal effort  MSK:   Moves extremities without difficulty  Other:    Medical Decision Making  Medically screening exam initiated at 4:05 PM.  Appropriate orders placed.  Gabriel Garcia was informed that the remainder of the evaluation will be completed by another provider, this initial triage assessment does not replace that evaluation, and the importance of remaining in the ED until their evaluation is complete.     Marlowe Shores, PA-C 05/12/22 1610

## 2022-05-12 NOTE — ED Triage Notes (Addendum)
Patient c/o hypotension, SOB, dizziness and fatigue x 3 days. BP in triage 111/75. Patiaent also c/o chest pain when he takes a deep breath.  Patient states his BP at home was 90/65 and states he took it 3-4 times.

## 2022-05-12 NOTE — ED Provider Notes (Signed)
[facility]-EMERGENCY DEPT Provider Note   CSN: [id number] Arrival date & time: 05/12/22  1434     History  Chief Complaint  Patient presents with   Hypotension   Shortness of Breath   Fatigue   Dizziness   Chest Pain    Gabriel Garcia is a 46 y.o. male.  Pt complains of fatigue, dizziness, and dyspnea on exertion for 3 days. Has noted his blood pressure, which is usually high, has been incredibly low as he's been checking it at home. On two different blood pressure medications, last changed 3 months ago. Has some chest soreness with coughing, no shortness of breath at rest. Has hx of GERD and esophageal rupture, has persistent indigestion and upper abd pain with eating.    Shortness of Breath Associated symptoms: chest pain   Dizziness Associated symptoms: chest pain and shortness of breath   Chest Pain Associated symptoms: dizziness, fatigue and shortness of breath        Home Medications Prior to Admission medications   Medication Sig Start Date End Date Taking? Authorizing Provider  allopurinol (ZYLOPRIM) 100 MG tablet Take 1 tablet (100 mg total) by mouth daily. 12/21/21   [name], MD  amLODipine (NORVASC) 5 MG tablet Take 5 mg by mouth daily. 07/29/19   [provider]  atorvastatin (LIPITOR) 20 MG tablet Take 20 mg by mouth daily. 07/29/19   [provider]  losartan (COZAAR) 50 MG tablet Take 100 mg by mouth daily. 07/22/19   [provider]  omeprazole (PRILOSEC) 40 MG capsule TAKE 1 CAPSULE BY MOUTH EVERY DAY Patient taking differently: Take 40 mg by mouth daily. 08/31/19   [name], MD  traZODone (DESYREL) 50 MG tablet TAKE 2 TABLETS (100 MG TOTAL) BY MOUTH AT BEDTIME AS NEEDED FOR SLEEP. 12/31/21   [name], MD  valACYclovir (VALTREX) 500 MG tablet TAKE 1 TABLET (500 MG TOTAL) BY MOUTH 2 (TWO) TIMES DAILY. FOR 3 DAYS WITH OUTBREAKS Patient taking differently: Take 500 mg by mouth 2  (two) times daily. 10/31/20   [name], MD      Allergies    Patient has no known allergies.    Review of Systems   Review of Systems  Constitutional:  Positive for fatigue.  Respiratory:  Positive for shortness of breath.   Cardiovascular:  Positive for chest pain.  Neurological:  Positive for dizziness.  All other systems reviewed and are negative.   Physical Exam Updated Vital Signs BP 114/78   Pulse 63   Temp 98.2 F (36.8 C) (Oral)   Resp 16   Ht 5\' 10"  (1.778 m)   Wt 108.9 kg   SpO2 99%   BMI 34.44 kg/m  Physical Exam Vitals and nursing note reviewed.  Constitutional:      Appearance: Normal appearance.  HENT:     Head: Normocephalic and atraumatic.  Eyes:     Conjunctiva/sclera: Conjunctivae normal.  Cardiovascular:     Rate and Rhythm: Normal rate and regular rhythm.  Pulmonary:     Effort: Pulmonary effort is normal. No respiratory distress.     Breath sounds: Normal breath sounds.  Abdominal:     General: There is no distension.     Palpations: Abdomen is soft.     Tenderness: There is no abdominal tenderness.  Skin:    General: Skin is warm and dry.  Neurological:     General: No focal deficit present.     Mental  Status: He is alert.     ED Results / Procedures / Treatments   Labs (all labs ordered are listed, but only abnormal results are displayed) Labs Reviewed  COMPREHENSIVE METABOLIC PANEL - Abnormal; Notable for the following components:      Result Value   Glucose, Bld 105 (*)    All other components within normal limits  CBC  LIPASE, BLOOD  TROPONIN I (HIGH SENSITIVITY)    EKG EKG Interpretation  Date/Time:  Sunday May 12 2022 15:25:30 EDT Ventricular Rate:  82 PR Interval:  162 QRS Duration: 99 QT Interval:  391 QTC Calculation: 457 R Axis:   67 Text Interpretation: Sinus rhythm Abnormal inferior Q waves no acute ST/T changes similar to May 2022 Confirmed by Pricilla Loveless [phone]) on 05/12/2022 4:33:06  PM  Radiology DG Chest 2 View  Result Date: 05/12/2022 CLINICAL DATA:  A 46 year old male presents for evaluation shortness of breath, hypotension. EXAM: CHEST - 2 VIEW COMPARISON:  Mar 02, 2021. FINDINGS: EKG leads project over the chest. Cardiomediastinal contours and hilar structures are normal. Trachea is midline accounting for slight rotation to the RIGHT on frontal view. No lobar consolidation. No pneumothorax.  No sign of pleural effusion. On limited assessment no acute skeletal findings. IMPRESSION: No active cardiopulmonary disease. Electronically Signed   By: Donzetta Kohut M.D.   On: 05/12/2022 16:19    Procedures Procedures    Medications Ordered in ED Medications  sodium chloride 0.9 % bolus 1,000 mL (0 mLs Intravenous Stopped 05/12/22 1956)    ED Course/ Medical Decision Making/ A&P                           Medical Decision Making Amount and/or Complexity of Data Reviewed Labs: ordered. Radiology: ordered.  Patient is a 46 year old male with a history of hypertension, hepatitis C, terminal ileitis, and GERD who presents the emergency department complaining of low blood pressure readings and fatigue for 3 days.  Patient states he has been checking his blood pressure and seeing systolic readings in the 80s to 100s.  Chart reviewed with pertinent results: Patient on amlodipine and losartan for hypertension, most recently had update to this about 3 months ago by his PCP.  Patient previously saw [facility] for ileitis, but has not followed up since 2015.  On exam, patient has blood pressure readings with systolics in the 110s and diastolics in the 70-80s.  Normal vital signs.  Abdomen soft, nontender.  Had initially endorsed some chest soreness but states this is resolved. No active chest pain/discomfort. Lung sounds clear and good oxygen saturation.  On laboratory evaluation patient has no leukocytosis, normal hemoglobin.  Electrolytes within normal limits with normal kidney  and liver function.  Troponin 5.  Lipase 29.  Chest x-ray performed showed no acute cardiopulmonary abnormalities, and I agree with the radiologist interpretation  Patient placed on cardiac monitor and EKG interpreted by attending physician which showed: sinus rhythm, abnormal inferior Q waves, without ST changes.   Patient given IV fluids for low blood pressure readings thought to be due to dehydration (as reported by patient).  On reevaluation patient states that his symptoms have greatly improved and he is ready to be discharged home.    After consideration of the diagnostic results and the patients response to treatment, I feel that emergency department workup does not suggest an emergent condition requiring admission or immediate intervention beyond what has been performed at this time.  We discussed follow-up with GI given his significant history with reflux, gastritis and ileitis.  Referral sent to [facility].  Will recommend that patient follow-up with his primary doctor regarding his hypertension medication management.   The patient is safe for discharge and has been instructed to return immediately for worsening symptoms, change in symptoms or any other concerns.  Final Clinical Impression(s) / ED Diagnoses Final diagnoses:  Nonspecific low blood pressure reading  Other fatigue  Gastroesophageal reflux disease without esophagitis    Rx / DC Orders ED Discharge Orders          Ordered    Ambulatory referral to Gastroenterology        05/12/22 2000           Portions of this report may have been transcribed using voice recognition software. Every effort was made to ensure accuracy; however, inadvertent computerized transcription errors may be present.    Jeanella Flattery 05/12/22 2145    Pricilla Loveless, MD 05/13/22 [phone]

## 2022-05-12 NOTE — Discharge Instructions (Addendum)
You were seen in the emergency department today for fatigue and lower blood pressure readings.  As we discussed your lab work looked reassuring today.  While we did not find an acute etiology for your symptoms, I do think you would benefit from further follow-up with a GI doctor.  I sent a referral for you.  I have also attached their contact information for you to call and make an appointment if you do not hear from them.  It is possible that your symptoms could be related to a viral illness, especially with your nausea and episode of vomiting the other day.  Make sure that you are staying really well-hydrated and getting adequate rest.  Follow up with your primary doctor tomorrow about your blood pressure medicines.   Continue to monitor how you're doing and return to the ER for new or worsening symptoms.

## 2022-05-14 ENCOUNTER — Other Ambulatory Visit: Payer: Self-pay

## 2022-05-14 ENCOUNTER — Emergency Department (HOSPITAL_COMMUNITY)
Admission: EM | Admit: 2022-05-14 | Discharge: 2022-05-14 | Disposition: A | Discharge status: Home / Self Care | Payer: Commercial Managed Care - PPO | Attending: Emergency Medicine | Admitting: Emergency Medicine

## 2022-05-14 ENCOUNTER — Encounter (HOSPITAL_COMMUNITY): Payer: Self-pay | Admitting: Emergency Medicine

## 2022-05-14 DIAGNOSIS — Y9389 Activity, other specified: Secondary | ICD-10-CM | POA: Insufficient documentation

## 2022-05-14 DIAGNOSIS — X58XXXA Exposure to other specified factors, initial encounter: Secondary | ICD-10-CM | POA: Diagnosis not present

## 2022-05-14 DIAGNOSIS — S0501XA Injury of conjunctiva and corneal abrasion without foreign body, right eye, initial encounter: Secondary | ICD-10-CM | POA: Insufficient documentation

## 2022-05-14 DIAGNOSIS — S0591XA Unspecified injury of right eye and orbit, initial encounter: Secondary | ICD-10-CM | POA: Diagnosis present

## 2022-05-14 MED ORDER — POLYMYXIN B-TRIMETHOPRIM 10000-0.1 UNIT/ML-% OP SOLN: 1.0000 [drp] | OPHTHALMIC | 0 refills | Status: DC

## 2022-05-14 MED ORDER — FLUORESCEIN SODIUM 1 MG OP STRP
1.0000 | ORAL_STRIP | Freq: Once | OPHTHALMIC | Status: AC
  Administered 2022-05-14: 1 via OPHTHALMIC
  Filled 2022-05-14 (×2): qty 1

## 2022-05-14 MED ORDER — TETRACAINE HCL 0.5 % OP SOLN
2.0000 [drp] | Freq: Once | OPHTHALMIC | Status: AC
  Administered 2022-05-14: 2 [drp] via OPHTHALMIC
  Filled 2022-05-14: qty 4

## 2022-05-14 MED ORDER — IBUPROFEN 800 MG PO TABS
800.0000 mg | ORAL_TABLET | Freq: Once | ORAL | Status: AC
  Administered 2022-05-14: 800 mg via ORAL
  Filled 2022-05-14: qty 1

## 2022-05-14 NOTE — ED Provider Notes (Signed)
[facility]-EMERGENCY DEPT Provider Note   CSN: [id number] Arrival date & time: 05/14/22  0431     History  Chief Complaint  Patient presents with   Foreign Body in [facility] Gabriel Garcia is a 46 y.o. male.  The history is provided by the patient.  Foreign Body in Eye This is a new problem. The current episode started yesterday. The problem occurs constantly. The problem has not changed since onset.Pertinent negatives include no chest pain and no abdominal pain. Nothing aggravates the symptoms. Nothing relieves the symptoms. He has tried nothing for the symptoms. The treatment provided no relief.  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  Only perceives light      Home Medications Prior to Admission medications   Medication Sig Start Date End Date Taking? Authorizing Provider  trimethoprim-polymyxin b (POLYTRIM) ophthalmic solution Place 1 drop into the right eye every 4 (four) hours. 05/14/22  Yes Diva Lemberger, MD  allopurinol (ZYLOPRIM) 100 MG tablet Take 1 tablet (100 mg total) by mouth daily. 12/21/21   [name], MD  amLODipine (NORVASC) 10 MG tablet Take 10 mg by mouth daily. 03/16/22   [provider]  amLODipine (NORVASC) 5 MG tablet Take 5 mg by mouth daily. 07/29/19   [provider]  atorvastatin (LIPITOR) 20 MG tablet Take 20 mg by mouth daily. 07/29/19   [provider]  losartan (COZAAR) 50 MG tablet Take 100 mg by mouth daily. 07/22/19   [provider]  losartan-hydrochlorothiazide (HYZAAR) 100-12.5 MG tablet Take 1 tablet by mouth daily. 03/01/22   [provider]  omeprazole (PRILOSEC) 40 MG capsule TAKE 1 CAPSULE BY MOUTH EVERY DAY Patient taking differently: Take 40 mg by mouth daily. 08/31/19   [name], MD  pantoprazole (PROTONIX) 40 MG tablet Take 40 mg by mouth daily. 05/11/22   [provider]  traZODone (DESYREL) 50 MG tablet TAKE 2 TABLETS (100 MG TOTAL) BY MOUTH AT BEDTIME AS NEEDED FOR SLEEP. 12/31/21   [name], MD  valACYclovir (VALTREX) 500 MG tablet TAKE 1 TABLET (500 MG TOTAL) BY MOUTH 2 (TWO) TIMES DAILY. FOR 3 DAYS WITH OUTBREAKS Patient taking differently: Take 500 mg by mouth 2 (two) times daily. 10/31/20   [name], MD      Allergies    Patient has no known allergies.    Review of Systems   Review of Systems  Constitutional:  Negative for fever.  HENT:  Negative for facial swelling.   Eyes:  Positive for pain. Negative for visual disturbance.  Respiratory:  Negative for wheezing and stridor.   Cardiovascular:  Negative for chest pain.  Gastrointestinal:  Negative for abdominal pain.  All other systems reviewed and are negative.   Physical Exam Updated Vital Signs BP (!) 165/100 (BP Location: Left Arm)   Pulse 63   Temp (!) 97.5 F (36.4 C) (Oral)   Resp 18   Ht 5\' 10"  (1.778 m)   Wt 108.9 kg   SpO2 99%   BMI 34.44 kg/m  Physical Exam Vitals and nursing note reviewed. Exam conducted with a chaperone present.  Constitutional:      General: He is not in acute distress.    Appearance: He is well-developed. He is not diaphoretic.  HENT:     Head: Normocephalic and atraumatic.  Eyes:  General: Lids are normal. Lids are everted, no foreign bodies appreciated.        Right eye: No foreign body.     Extraocular Movements: Extraocular movements intact.     Right eye: Normal extraocular motion.     Left eye: Normal extraocular motion.     Conjunctiva/sclera:     Right eye: Right conjunctiva is not injected.     Left eye: Left conjunctiva is not injected.   Cardiovascular:     Rate and Rhythm: Normal rate and regular rhythm.  Pulmonary:     Effort: Pulmonary effort is normal.     Breath sounds: Normal breath sounds. No wheezing or rales.  Abdominal:     General: Bowel sounds are normal.     Palpations: Abdomen is  soft.     Tenderness: There is no abdominal tenderness. There is no guarding or rebound.  Musculoskeletal:        General: Normal range of motion.     Cervical back: Normal range of motion and neck supple.  Skin:    General: Skin is warm and dry.     Capillary Refill: Capillary refill takes less than 2 seconds.  Neurological:     General: No focal deficit present.     Mental Status: He is alert and oriented to person, place, and time.  Psychiatric:        Mood and Affect: Mood normal.        Behavior: Behavior normal.     ED Results / Procedures / Treatments   Labs (all labs ordered are listed, but only abnormal results are displayed) Labs Reviewed - No data to display  EKG None  Radiology DG Chest 2 View  Result Date: 05/12/2022 CLINICAL DATA:  A 46 year old male presents for evaluation shortness of breath, hypotension. EXAM: CHEST - 2 VIEW COMPARISON:  Mar 02, 2021. FINDINGS: EKG leads project over the chest. Cardiomediastinal contours and hilar structures are normal. Trachea is midline accounting for slight rotation to the RIGHT on frontal view. No lobar consolidation. No pneumothorax.  No sign of pleural effusion. On limited assessment no acute skeletal findings. IMPRESSION: No active cardiopulmonary disease. Electronically Signed   By: Donzetta Kohut M.D.   On: 05/12/2022 16:19    Procedures Procedures    Medications Ordered in ED Medications  ibuprofen (ADVIL) tablet 800 mg (has no administration in time range)  fluorescein ophthalmic strip 1 strip (1 strip Right Eye Given 05/14/22 0544)  tetracaine (PONTOCAINE) 0.5 % ophthalmic solution 2 drop (2 drops Right Eye Given 05/14/22 0544)    ED Course/ Medical Decision Making/ A&P                           Medical Decision Making Patient with FB sensation of the R eye that he is blind in for more than 24 hours   Risk Prescription drug management. Risk Details: Corneal abrasion,  eye drops and close follow up with  ophthalmology for ongoing care. Strict return precautions given.      Final Clinical Impression(s) / ED Diagnoses Final diagnoses:  Abrasion of right cornea, initial encounter   Return for intractable cough, coughing up blood, fevers > 100.4 unrelieved by medication, shortness of breath, intractable vomiting, chest pain, shortness of breath, weakness, numbness, changes in speech, facial asymmetry, abdominal pain, passing out, Inability to tolerate liquids or food, cough, altered mental status or any concerns. No signs of systemic illness or infection. The patient  is nontoxic-appearing on exam and vital signs are within normal limits.  I have reviewed the triage vital signs and the nursing notes. Pertinent labs & imaging results that were available during my care of the patient were reviewed by me and considered in my medical decision making (see chart for details). After history, exam, and medical workup I feel the patient has been appropriately medically screened and is safe for discharge home. Pertinent diagnoses were discussed with the patient. Patient was given return precautions.  Rx / DC Orders ED Discharge Orders          Ordered    trimethoprim-polymyxin b (POLYTRIM) ophthalmic solution  Every 4 hours        05/14/22 0545              Esmond Hinch, MD 05/14/22 2703

## 2022-05-14 NOTE — ED Triage Notes (Signed)
Patient c/o right eye pain after mowing the grass yesterday.  Patient reports eye was matted when he woke up this morning and is endorsing burning and pain.

## 2022-07-02 ENCOUNTER — Encounter: Payer: Commercial Managed Care - PPO | Admitting: Family Medicine

## 2022-07-08 ENCOUNTER — Encounter: Payer: Self-pay | Admitting: *Deleted

## 2022-08-20 ENCOUNTER — Emergency Department (HOSPITAL_COMMUNITY)
Admission: EM | Admit: 2022-08-20 | Discharge: 2022-08-20 | Discharge status: Against Medical Advice | Payer: Self-pay | Attending: Emergency Medicine | Admitting: Emergency Medicine

## 2022-08-20 ENCOUNTER — Encounter (HOSPITAL_COMMUNITY): Payer: Self-pay

## 2022-08-20 DIAGNOSIS — Z5321 Procedure and treatment not carried out due to patient leaving prior to being seen by health care provider: Secondary | ICD-10-CM | POA: Insufficient documentation

## 2022-08-20 DIAGNOSIS — H579 Unspecified disorder of eye and adnexa: Secondary | ICD-10-CM | POA: Insufficient documentation

## 2022-08-20 NOTE — ED Triage Notes (Addendum)
Patient arrived stating over the last month once a week his right eye will "flare up" for a day or two and get better. Tonight states it feels like sand in his eye. Redness and drainage noted. Patient has a history of being completely blind in his right eye, states his pupil is shattered.

## 2022-09-26 ENCOUNTER — Encounter: Payer: Self-pay | Admitting: *Deleted

## 2022-10-22 ENCOUNTER — Ambulatory Visit
Admission: EM | Admit: 2022-10-22 | Discharge: 2022-10-22 | Disposition: A | Discharge status: Home / Self Care | Payer: Self-pay | Attending: Physician Assistant | Admitting: Physician Assistant

## 2022-10-22 ENCOUNTER — Encounter: Payer: Self-pay | Admitting: Emergency Medicine

## 2022-10-22 ENCOUNTER — Other Ambulatory Visit: Payer: Self-pay

## 2022-10-22 ENCOUNTER — Telehealth: Payer: Self-pay | Admitting: Family Medicine

## 2022-10-22 DIAGNOSIS — G4733 Obstructive sleep apnea (adult) (pediatric): Secondary | ICD-10-CM

## 2022-10-22 DIAGNOSIS — H5711 Ocular pain, right eye: Secondary | ICD-10-CM

## 2022-10-22 DIAGNOSIS — S0501XA Injury of conjunctiva and corneal abrasion without foreign body, right eye, initial encounter: Secondary | ICD-10-CM

## 2022-10-22 MED ORDER — POLYMYXIN B-TRIMETHOPRIM 10000-0.1 UNIT/ML-% OP SOLN: 1.0000 [drp] | OPHTHALMIC | 0 refills | Status: DC

## 2022-10-22 NOTE — Telephone Encounter (Signed)
Patient states: -PCP placed a order for a CPAP last year - When he was about to pick up cpap, he lost his insurance  - Recently got approved for medicaid-- will need a new order placed  Patient requests:  -New CPAP order be sent to [facility] at [address] [place], [place],  [address] Phone: [phone]

## 2022-10-22 NOTE — ED Provider Notes (Signed)
[facility]    CSN: [id number] Arrival date & time: 10/22/22  1344      History   Chief Complaint Chief Complaint  Patient presents with   Eye Pain    HPI Gabriel Garcia is a 47 y.o. male.   47-year-old male presents with right eye pain, irritation and drainage.  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  He relates he has a cataract developed on the right eye along with some scarring from the surgical procedures.  He relates for the past 6 to 8 weeks he has been having recurrent pain in the right eye that tends to feel like a needle or something sharp sticking him in the right upper quadrant of the eye and then also the left upper quadrant the eye.  He indicates sometimes it feels like he is got "a hair sticking on the inside".  Patient indicates he gets considerable amount of watering of the eye along with the pain and discomfort.  He indicates that the pain will last for about a week and then it tends to resolve and then it returns.  He indicates he has not traumatized the eye, rubbed it, scratched it.  Patient indicates he is try to get in with a an eye doctor but they stated be 6 months before he can be seen.  Denies any fever or chills.   Eye Pain    Past Medical History:  Diagnosis Date   Acute duodenal ulcer with hemorrhage 10/12/2014   GERD (gastroesophageal reflux disease)    GI bleed    Gout    Hepatitis C    Hypertension    Legal blindness of right eye, as defined in [place] 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[facility])     Patient Active Problem List   Diagnosis Date Noted   Coronary atherosclerosis 12/21/2021   Hyperglycemia 12/21/2021   COVID-19 virus infection 05/01/2019   Low back pain 03/02/2019   Hyperlipidemia, unspecified 03/02/2019   Upper GI bleed  08/31/2018   Atypical chest pain 06/18/2018   Mallory-Weiss tear 12/03/2016   BPPV (benign paroxysmal positional vertigo) 09/12/2016   Genital herpes 09/12/2016   OSA (obstructive sleep apnea) 05/28/2016   Obese 11/17/2015   Cocaine abuse in remission ([place]) 11/16/2015   Alcoholism ([place]) 11/16/2015   Gout 11/16/2015   Legally blind in right eye, as defined in [place]    Acute duodenal ulcer with hemorrhage 10/12/2014   EKG abnormality 10/12/2014   Hypertension 10/06/2014   Esophageal reflux 11/24/2013   Hepatitis C     Past Surgical History:  Procedure Laterality Date   BIOPSY  09/08/2018   Procedure: BIOPSY;  Surgeon: Ronald Lobo, MD;  Location: [facility] ENDOSCOPY;  Service: Endoscopy;;   ESOPHAGOGASTRODUODENOSCOPY N/A 10/13/2014   Procedure: ESOPHAGOGASTRODUODENOSCOPY (EGD);  Surgeon: Gatha Mayer, MD;  Location: [facility];  Service: Endoscopy;  Laterality: N/A;   ESOPHAGOGASTRODUODENOSCOPY N/A 11/17/2016   Procedure: ESOPHAGOGASTRODUODENOSCOPY (EGD);  Surgeon: Wilford Corner, MD;  Location: [facility] ENDOSCOPY;  Service: Endoscopy;  Laterality: N/A;   ESOPHAGOGASTRODUODENOSCOPY (EGD) WITH PROPOFOL N/A 09/01/2018   Procedure: ESOPHAGOGASTRODUODENOSCOPY (EGD) WITH PROPOFOL;  Surgeon: Laurence Spates, MD;  Location: [facility];  Service: Endoscopy;  Laterality: N/A;   ESOPHAGOGASTRODUODENOSCOPY (EGD) WITH PROPOFOL N/A 09/08/2018  Procedure: ESOPHAGOGASTRODUODENOSCOPY (EGD) WITH PROPOFOL;  Surgeon: Bernette Redbird, MD;  Location: [facility];  Service: Endoscopy;  Laterality: N/A;   EYE SURGERY Right    FOOT SURGERY Right    LEFT HEART CATH AND CORONARY ANGIOGRAPHY N/A 08/17/2019   Procedure: LEFT HEART CATH AND CORONARY ANGIOGRAPHY;  Surgeon: Rinaldo Cloud, MD;  Location: [facility] INVASIVE CV LAB;  Service: Cardiovascular;  Laterality: N/A;   LIVER BIOPSY     Dr. Kinnie Scales GI       Home Medications    Prior to Admission medications   Medication Sig Start Date End Date Taking? Authorizing  Provider  trimethoprim-polymyxin b (POLYTRIM) ophthalmic solution Place 1 drop into the right eye every 4 (four) hours. 10/22/22  Yes Ellsworth Lennox, PA-C  allopurinol (ZYLOPRIM) 100 MG tablet Take 1 tablet (100 mg total) by mouth daily. 12/21/21   [name], MD  amLODipine (NORVASC) 10 MG tablet Take 10 mg by mouth daily. 03/16/22   [provider]  amLODipine (NORVASC) 5 MG tablet Take 5 mg by mouth daily. 07/29/19   [provider]  atorvastatin (LIPITOR) 20 MG tablet Take 20 mg by mouth daily. 07/29/19   [provider]  losartan (COZAAR) 50 MG tablet Take 100 mg by mouth daily. 07/22/19   [provider]  losartan-hydrochlorothiazide (HYZAAR) 100-12.5 MG tablet Take 1 tablet by mouth daily. 03/01/22   [provider]  omeprazole (PRILOSEC) 40 MG capsule TAKE 1 CAPSULE BY MOUTH EVERY DAY Patient taking differently: Take 40 mg by mouth daily. 08/31/19   [name], MD  pantoprazole (PROTONIX) 40 MG tablet Take 40 mg by mouth daily. 05/11/22   [provider]  traZODone (DESYREL) 50 MG tablet TAKE 2 TABLETS (100 MG TOTAL) BY MOUTH AT BEDTIME AS NEEDED FOR SLEEP. 12/31/21   [name], MD  trimethoprim-polymyxin b (POLYTRIM) ophthalmic solution Place 1 drop into the right eye every 4 (four) hours. 05/14/22   Palumbo, April, MD  valACYclovir (VALTREX) 500 MG tablet TAKE 1 TABLET (500 MG TOTAL) BY MOUTH 2 (TWO) TIMES DAILY. FOR 3 DAYS WITH OUTBREAKS Patient taking differently: Take 500 mg by mouth 2 (two) times daily. 10/31/20   [name], MD    Family History Family History  Problem Relation Age of Onset   Rheum arthritis Mother    Hypertension Mother    Alcoholism Mother    Heart failure Father        rheumatic heart disease, also drinking and smoking   Alcoholism Father        died age 33   Colon cancer Neg Hx     Social History Social History   Tobacco Use   Smoking status: Never   Smokeless tobacco: Never   Vaping Use   Vaping Use: Never used  Substance Use Topics   Alcohol use: No    Comment: since 2016   Drug use: Not Currently    Types: Cocaine    Comment: clean x approx 1 yr     Allergies   Patient has no known allergies.   Review of Systems Review of Systems  Eyes:  Positive for pain (right eye).     Physical Exam Triage Vital Signs ED Triage Vitals [10/22/22 1519]  Enc Vitals Group     BP (!) 147/102     Pulse Rate 93     Resp 18     Temp 98.1 F (36.7 C)     Temp Source Oral  SpO2 97 %     Weight      Height      Head Circumference      Peak Flow      Pain Score 5     Pain Loc      Pain Edu?      Excl. in GC?    No data found.  Updated Vital Signs BP (!) 147/102 (BP Location: Left Arm)   Pulse 93   Temp 98.1 F (36.7 C) (Oral)   Resp 18   SpO2 97%   Visual Acuity Right Eye Distance:   Left Eye Distance:   Bilateral Distance:    Right Eye Near:   Left Eye Near:    Bilateral Near:     Physical Exam Constitutional:      Appearance: Normal appearance.  Eyes:      Comments: Pain: Upper and lower lid eversion reveals normal tissue without evidence of foreign body or tissue abnormality.  Visualization of the conjunctiva reveals past surgical procedure with scarring and scar tissue at the 12 o'clock position of the eye.  The pupil and cornea has a cataract that is mainly located on the right side.  Flora stain reveals staining uptake at the 6 o'clock position underneath the pupil and on the edge of the cornea which may be secondary to old trauma.  No evidence of corneal abrasion or foreign body present.  Neurological:     Mental Status: He is alert.      UC Treatments / Results  Labs (all labs ordered are listed, but only abnormal results are displayed) Labs Reviewed - No data to display  EKG   Radiology No results found.  Procedures Procedures (including critical care time)  Medications Ordered in UC Medications - No data to  display  Initial Impression / Assessment and Plan / UC Course  I have reviewed the triage vital signs and the nursing notes.  Pertinent labs & imaging results that were available during my care of the patient were reviewed by me and considered in my medical decision making (see chart for details).    Plan: 1.  The abrasion or scar tissue of the right will be treated with the following: A.  Polytrim eyedrops, 1 drop in the eye every 6 hours. BAlgis Downs to call [facility] ophthalmology and request appointment to be seen and have the right eye evaluated by specialist. 2.  The right eye pain will be treated with the following: A.  Polytrim eyedrops, 1 drop every 6 hours. BAlgis Downs to call [facility] ophthalmology and request appointment to be seen and have the right eye condition evaluated. 3.  Advised to follow-up PCP or return to urgent care as needed. Final Clinical Impressions(s) / UC Diagnoses   Final diagnoses:  Eye pain, right  Abrasion of right cornea, initial encounter     Discharge Instructions      Advised to use the Polytrim eyedrops, 1 drop in the right eye every 6 hours on a regular basis until seen by ophthalmology.  Advised to give [facility] ophthalmology a call and schedule an appointment.  Please let them know you were seen here at the urgent care today and that we believe that there may be some type of scratch or scar tissue that is possibly causing the problem and that you need to be evaluated.  The address and contact number is below.  [address] [place], [place], [place] [address] Phone: [phone]  ED Prescriptions     Medication Sig Dispense Auth. Provider   trimethoprim-polymyxin b (POLYTRIM) ophthalmic solution Place 1 drop into the right eye every 4 (four) hours. 10 mL Ellsworth Lennox, PA-C      PDMP not reviewed this encounter.   Ellsworth Lennox, PA-C 10/22/22 1606

## 2022-10-22 NOTE — Telephone Encounter (Signed)
Sure thing-  Gabriel Garcia can we place an order for new CPAP machine 5-15cm h20 Hst 12/01/2015 >>AHI 64/hr with SpO2 low 76% (average 91%) Symptoms snoring  Download from 2021 showed compliance, may need to pull new one  DME is [facility]

## 2022-10-22 NOTE — Telephone Encounter (Signed)
Hi Gabriel Garcia, it looks like you saw pt on 01/31/22 and handled this. Can you have your assistant reach out to pt and coordinate or place any necessary orders he may be needing please? Thank you for your help.

## 2022-10-22 NOTE — Discharge Instructions (Signed)
Advised to use the Polytrim eyedrops, 1 drop in the right eye every 6 hours on a regular basis until seen by ophthalmology.  Advised to give [facility] ophthalmology a call and schedule an appointment.  Please let them know you were seen here at the urgent care today and that we believe that there may be some type of scratch or scar tissue that is possibly causing the problem and that you need to be evaluated.  The address and contact number is below.  [place], [place], [place] [address] Phone: [phone]

## 2022-10-22 NOTE — ED Triage Notes (Signed)
Pt here for right eye pain intermittently with hx of similar in past where was diagnosed with abrasion; pt is blind in eye from trauma and has had multiple surgeries

## 2022-10-23 NOTE — Telephone Encounter (Signed)
Order placed for the cpap start. Nothing further needed.

## 2022-10-23 NOTE — Addendum Note (Signed)
Addended by: Lorretta Harp on: 10/23/2022 11:03 AM   Modules accepted: Orders

## 2022-11-12 ENCOUNTER — Ambulatory Visit: Payer: Medicaid Other | Admitting: Family Medicine

## 2022-11-19 ENCOUNTER — Ambulatory Visit: Payer: Medicaid Other | Admitting: Family Medicine

## 2022-11-22 ENCOUNTER — Encounter: Payer: Self-pay | Admitting: Family Medicine

## 2022-11-22 ENCOUNTER — Ambulatory Visit (INDEPENDENT_AMBULATORY_CARE_PROVIDER_SITE_OTHER): Payer: Medicaid Other | Admitting: Family Medicine

## 2022-11-22 VITALS — BP 110/82 | HR 80 | Temp 97.3°F | Ht 70.0 in | Wt 265.0 lb

## 2022-11-22 DIAGNOSIS — I2510 Atherosclerotic heart disease of native coronary artery without angina pectoris: Secondary | ICD-10-CM | POA: Diagnosis not present

## 2022-11-22 DIAGNOSIS — F1411 Cocaine abuse, in remission: Secondary | ICD-10-CM

## 2022-11-22 DIAGNOSIS — H5711 Ocular pain, right eye: Secondary | ICD-10-CM | POA: Diagnosis not present

## 2022-11-22 DIAGNOSIS — M10071 Idiopathic gout, right ankle and foot: Secondary | ICD-10-CM | POA: Diagnosis not present

## 2022-11-22 DIAGNOSIS — R739 Hyperglycemia, unspecified: Secondary | ICD-10-CM

## 2022-11-22 DIAGNOSIS — G4733 Obstructive sleep apnea (adult) (pediatric): Secondary | ICD-10-CM

## 2022-11-22 DIAGNOSIS — A6000 Herpesviral infection of urogenital system, unspecified: Secondary | ICD-10-CM

## 2022-11-22 DIAGNOSIS — F1020 Alcohol dependence, uncomplicated: Secondary | ICD-10-CM

## 2022-11-22 MED ORDER — PANTOPRAZOLE SODIUM 40 MG PO TBEC: 40.0000 mg | DELAYED_RELEASE_TABLET | Freq: Every day | ORAL | 3 refills | Status: DC

## 2022-11-22 MED ORDER — VALACYCLOVIR HCL 500 MG PO TABS: 500.0000 mg | ORAL_TABLET | Freq: Two times a day (BID) | ORAL | 2 refills | Status: DC

## 2022-11-22 MED ORDER — ATORVASTATIN CALCIUM 20 MG PO TABS: 20.0000 mg | ORAL_TABLET | Freq: Every day | ORAL | 3 refills | Status: DC

## 2022-11-22 MED ORDER — LOSARTAN POTASSIUM-HCTZ 100-12.5 MG PO TABS: 1.0000 | ORAL_TABLET | Freq: Every day | ORAL | 3 refills | Status: DC

## 2022-11-22 MED ORDER — AMLODIPINE BESYLATE 10 MG PO TABS: 10.0000 mg | ORAL_TABLET | Freq: Every day | ORAL | 3 refills | Status: DC

## 2022-11-22 MED ORDER — TRAZODONE HCL 50 MG PO TABS: 100.0000 mg | ORAL_TABLET | Freq: Every evening | ORAL | 1 refills | Status: DC | PRN

## 2022-11-22 NOTE — Progress Notes (Unsigned)
Phone [phone] In person visit   Subjective:   Rosendo Coniglio is a 47 y.o. year old very pleasant male patient who presents for/with See problem oriented charting Chief Complaint  Patient presents with   CPAP    Pt is here to discuss new CPAP   eye issues    Pt blind in right eye and c/o irritation and redness. Has been seen in ER for this and was told he has an abrasion.   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[place]) 11/16/2015    Priority: High   Alcoholism ([place]) 11/16/2015    Priority: High   Hepatitis C     Priority: High   Hyperglycemia 12/21/2021    Priority: Medium    Hyperlipidemia, unspecified 03/02/2019    Priority: Medium    Genital herpes 09/12/2016    Priority: Medium    OSA (obstructive sleep apnea) 05/28/2016    Priority: Medium    Gout 11/16/2015    Priority: Medium    Acute duodenal ulcer with hemorrhage 10/12/2014    Priority: Medium    Hypertension 10/06/2014    Priority: Medium    Low back pain 03/02/2019    Priority: Low   BPPV (benign paroxysmal positional vertigo) 09/12/2016    Priority: Low   Obese 11/17/2015    Priority: Low   Legally blind in right eye, as defined in [place]     Priority: Low   EKG abnormality 10/12/2014    Priority: Low   Esophageal reflux 11/24/2013    Priority: Low   Atypical chest pain 06/18/2018    Medications- reviewed and updated Current Outpatient Medications  Medication Sig Dispense Refill   allopurinol (ZYLOPRIM) 100 MG tablet Take 1 tablet (100 mg total) by mouth daily. 90 tablet 3   amLODipine (NORVASC) 10 MG tablet Take 1 tablet (10 mg total) by mouth daily. 90 tablet 3   atorvastatin (LIPITOR) 20 MG tablet Take 1 tablet (20  mg total) by mouth daily. 90 tablet 3   losartan-hydrochlorothiazide (HYZAAR) 100-12.5 MG tablet Take 1 tablet by mouth daily. 90 tablet 3   pantoprazole (PROTONIX) 40 MG tablet Take 1 tablet (40 mg total) by mouth daily. 90 tablet 3   traZODone (DESYREL) 50 MG tablet Take 2 tablets (100 mg total) by mouth at bedtime as needed for sleep. 180 tablet 1   valACYclovir (VALTREX) 500 MG tablet Take 1 tablet (500 mg total) by mouth 2 (two) times daily. For 3 days with outbreaks 60 tablet 2   No current facility-administered medications for this visit.     Objective:  BP 110/82   Pulse 80   Temp (!) 97.3 F (36.3 C)   Ht 5' 10"$  (1.778 m)   Wt 265 lb (120.2 kg)   SpO2 98%   BMI 38.02 kg/m  Gen: NAD, resting comfortably Blown pupil on the right with visible what appears to be cataract-patient with substantial loss of vision in this eye-gets watering of the eye with examination but denied most of the ear pain that he has had previously CV: RRR no murmurs rubs or gallops Lungs: CTAB no crackles, wheeze, rhonchi Abdomen: soft/nontender/nondistended/normal bowel sounds. No rebound or guarding.  Ext: trace to 1+ edema Skin: warm, dry     Assessment and Plan   # Right Eye pain S:has bene having issues in right eye since at least August of last year- 2 ED visits for this, urgent care, also went to [facility] within last week. Originally August 2023- was given polytrim drops with no relief. Given another et of drops by urgent care. Not sure if they checked pressure at last visit because his pain was so bad.   History of trauma to eye years ago and has large cataract. Vision is so minimal at baseline hard to tell if worse- main issue is the pain  When he gets pain comes in severe surges of diffuse pain up to 10/10 like someone sticking nedele in eye for a few seconds then gets profuse watering and left side mimics watering- then gets pain from inner portion of eye as well. Gets sensation of sand  under the eyelid but nothing has been int he eye. No headaches with this.  -some relief with ibuprofen/aleve type products. Heat helps some A/P: I am uncertain of patient's cause of eye pain.  He has seen optometry without clear cause but I think we need to get ophthalmology's opinion especially with traumatic history and ongoing issues with pain.  ESR and CRP are not elevated-do not think this represents temporal arteritis.  I would even consider MRI of the orbits depending on ophthalmology's thoughts-referral was placed as urgent  # History of cocaine abuse as well as alcoholism-patient reports has been at least over 6 months without alcohol but he did have a setback 2 months ago with cocaine-reports no further use.  Congratulated on cessation and encouraged him to continue  #hypertension S: medication: amlodipine 10 mg, losartan-hydrochlorothiazide 100-12.5 mg BP Readings from Last 3 Encounters:  11/22/22 110/82  10/22/22 (!) 147/102  08/20/22 (!) 146/99  A/P: Controlled. Continue current medications.   #hyperlipidemia S: Medication:atorvastatin 20 mg daily  Lab Results  Component Value Date   CHOL 172 11/22/2022   HDL 51 11/22/2022   LDLCALC 93 11/22/2022   TRIG 183 (H) 11/22/2022   CHOLHDL 3.4 11/22/2022   A/P: Significantly improved control from prior checks minimal statin with history of coronary atherosclerosis on catheterization about 20% proximal RCA to mid RCA-can push for further reduction in the future but want to get a further grasp on my pain issue first  # GERD S:Medication: pantoprazole 40 mg- if misses dose for extended period starts to have difficulty swallowing - resolvs on medicine  B12 levels related to PPI use: Lab Results  Component Value Date   VITAMINB12 476 11/18/2016  A/P: Reasonable control-continue current medication  #Gout S: no flares while on allopurinol 184m- has had flares if misses. Been on consitently for a month Lab Results  Component Value  Date   LABURIC 7.5 11/22/2022   A/P: Uric acid above goal but no recent failures-discussed using this consistently but if has flares will need to adjust medicine given uric acid above goal  # Sleep apnea S:patient lost insurance and did not get machine before he lost his job.   Note from pulmonary on 01/31/22 "OSA (obstructive sleep apnea) - Patient has severe OSA. HST 12/01/2015 >>AHI 64/hr with SpO2 low 76% (average 91%). He is on auto CPAP 5-15cm h20. Having more snoring symptoms. Needs new CPAP machine/renew supplies, order has been place at current settings. Compliance report from 2021 showed 100% usage. We have contacted [employer] to re-enroll him in  [place] or provide him with SD card. FU in 3 months or sooner if needed.     Martyn Ehrich, NP 01/31/2022"  He reports he has been using his old machine- but does not seem to function well- does not rest well and always tired as machine has aged in last year. He is using current machine nightly for 6-8 hours a night but not feeling refreshed.   A/P: Patient has been consistent with his CPAP but his machine is out of date and malfunctioning-needs updated CPAP.  He reports [facility] already has everything on file they need except a face-to-face visit-I will ask my team to place a new request for CPAP and supplies to them-I believe prior materials were sent by pulmonology but he prefers to have his visits here if possible-we discussed would likely need a visit after he starts back on the CPAP per insurance regulations  Recommended follow up: Return in about 6 months (around 05/23/2023) for followup or sooner if needed.Schedule b4 you leave. Future Appointments  Date Time Provider [place]  05/23/2023  3:00 PM Marin Olp, MD [facility]    Lab/Order associations:   ICD-10-CM   1. Hyperglycemia  R73.9 HgB A1c    HgB A1c    2. Idiopathic gout of right foot, unspecified chronicity  M10.071 Uric acid    Uric acid    3.  Atherosclerosis of native coronary artery of native heart without angina pectoris  I25.10 CBC with Differential/Platelet    Comprehensive metabolic panel    Lipid panel    Lipid panel    Comprehensive metabolic panel    CBC with Differential/Platelet    4. Acute right eye pain  H57.11 Sedimentation rate    C-reactive protein    C-reactive protein    Sedimentation rate    Ambulatory referral to Ophthalmology    5. Genital herpes simplex, unspecified site  A60.00 valACYclovir (VALTREX) 500 MG tablet    6. Alcoholism ([place])  F10.20     7. Cocaine abuse in remission ([place])  F14.11     8. OSA (obstructive sleep apnea)  G47.33       Meds ordered this encounter  Medications   traZODone (DESYREL) 50 MG tablet    Sig: Take 2 tablets (100 mg total) by mouth at bedtime as needed for sleep.    Dispense:  180 tablet    Refill:  1   valACYclovir (VALTREX) 500 MG tablet    Sig: Take 1 tablet (500 mg total) by mouth 2 (two) times daily. For 3 days with outbreaks    Dispense:  60 tablet    Refill:  2   pantoprazole (PROTONIX) 40 MG tablet    Sig: Take 1 tablet (40 mg total) by mouth daily.    Dispense:  90 tablet    Refill:  3   amLODipine (NORVASC) 10 MG tablet    Sig: Take 1 tablet (10 mg total) by mouth daily.    Dispense:  90 tablet    Refill:  3   losartan-hydrochlorothiazide (HYZAAR) 100-12.5 MG tablet    Sig: Take 1 tablet by mouth daily.    Dispense:  90 tablet    Refill:  3   atorvastatin (LIPITOR) 20 MG tablet    Sig: Take 1 tablet (20 mg total) by mouth daily.    Dispense:  90 tablet    Refill:  3    Return precautions advised.  Garret Reddish, MD

## 2022-11-22 NOTE — Patient Instructions (Addendum)
Urgent referral to eye doctor. If we do not find answers from eye doctor- I want you to come back to discuss next steps/imaging -if you dont get a call from [place] or optho my Wednesday or if you dont see letter in [place] call us back  Thanks for doing labs today If you have mychart- we will send your results within 3 business days of [place] receiving them.  If you do not have mychart- we will call you about results within 5 business days of [place] receiving them.  *please also note that you will see labs on mychart as soon as they post. I will later go in and write notes on them- will say "notes from Dr. Yong Channel"    Recommended follow up: Return in about 6 months (around 05/23/2023) for followup or sooner if needed.Schedule b4 you leave. UNLESS see [place] sooner with eye

## 2022-11-23 LAB — LIPID PANEL
Cholesterol: 172 mg/dL (ref <200)
HDL: 51 mg/dL (ref >=40)
LDL Cholesterol (Calc): 93 mg/dL (calc)
Non-HDL Cholesterol (Calc): 121 mg/dL (calc) (ref <130)
Total CHOL/HDL Ratio: 3.4 (calc) (ref <5.0)
Triglycerides: 183 mg/dL — ABNORMAL HIGH (ref <150)

## 2022-11-23 LAB — CBC WITH DIFFERENTIAL/PLATELET
Absolute Monocytes: 449 cells/uL (ref 200–950)
Basophils Absolute: 60 cells/uL (ref 0–200)
Basophils Relative: 0.9 %
Eosinophils Absolute: 141 cells/uL (ref 15–500)
Eosinophils Relative: 2.1 %
HCT: 42.9 % (ref 38.5–50.0)
Hemoglobin: 15 g/dL (ref 13.2–17.1)
Lymphs Abs: 2124 cells/uL (ref 850–3900)
MCH: 30.6 pg (ref 27.0–33.0)
MCHC: 35 g/dL (ref 32.0–36.0)
MCV: 87.6 fL (ref 80.0–100.0)
MPV: 10.7 fL (ref 7.5–12.5)
Monocytes Relative: 6.7 %
Neutro Abs: 3926 cells/uL (ref 1500–7800)
Neutrophils Relative %: 58.6 %
Platelets: 272 10*3/uL (ref 140–400)
RBC: 4.9 10*6/uL (ref 4.20–5.80)
RDW: 13.9 % (ref 11.0–15.0)
Total Lymphocyte: 31.7 %
WBC: 6.7 10*3/uL (ref 3.8–10.8)

## 2022-11-23 LAB — HEMOGLOBIN A1C
Hgb A1c MFr Bld: 5.9 % of total Hgb — ABNORMAL HIGH (ref <5.7)
Mean Plasma Glucose: 123 mg/dL
eAG (mmol/L): 6.8 mmol/L

## 2022-11-23 LAB — C-REACTIVE PROTEIN: CRP: 0.8 mg/L (ref <8.0)

## 2022-11-23 LAB — COMPREHENSIVE METABOLIC PANEL
AG Ratio: 1.6 (calc) (ref 1.0–2.5)
ALT: 26 U/L (ref 9–46)
AST: 17 U/L (ref 10–40)
Albumin: 4.7 g/dL (ref 3.6–5.1)
Alkaline phosphatase (APISO): 46 U/L (ref 36–130)
BUN: 16 mg/dL (ref 7–25)
CO2: 27 mmol/L (ref 20–32)
Calcium: 9.8 mg/dL (ref 8.6–10.3)
Chloride: 103 mmol/L (ref 98–110)
Creat: 1.26 mg/dL (ref 0.60–1.29)
Globulin: 3 g/dL (calc) (ref 1.9–3.7)
Glucose, Bld: 97 mg/dL (ref 65–99)
Potassium: 4.2 mmol/L (ref 3.5–5.3)
Sodium: 141 mmol/L (ref 135–146)
Total Bilirubin: 0.5 mg/dL (ref 0.2–1.2)
Total Protein: 7.7 g/dL (ref 6.1–8.1)

## 2022-11-23 LAB — SEDIMENTATION RATE: Sed Rate: 9 mm/h (ref 0–15)

## 2022-11-23 LAB — URIC ACID: Uric Acid, Serum: 7.5 mg/dL (ref 4.0–8.0)

## 2022-11-23 NOTE — Assessment & Plan Note (Signed)
#   Sleep apnea S:patient lost insurance and did not get machine before he lost his job.   Note from pulmonary on 01/31/22 "OSA (obstructive sleep apnea) - Patient has severe OSA. HST 12/01/2015 >>AHI 64/hr with SpO2 low 76% (average 91%). He is on auto CPAP 5-15cm h20. Having more snoring symptoms. Needs new CPAP machine/renew supplies, order has been place at current settings. Compliance report from 2021 showed 100% usage. We have contacted [employer] to re-enroll him in [place] or provide him with SD card. FU in 3 months or sooner if needed.     Martyn Ehrich, NP 01/31/2022"  He reports he has been using his old machine- but does not seem to function well- does not rest well and always tired as machine has aged in last year. He is using current machine nightly for 6-8 hours a night but not feeling refreshed.   A/P: Patient has been consistent with his CPAP but his machine is out of date and malfunctioning-needs updated CPAP.  He reports [facility] already has everything on file they need except a face-to-face visit-I will ask my team to place a new request for CPAP and supplies to them-I believe prior materials were sent by pulmonology but he prefers to have his visits here if possible-we discussed would likely need a visit after he starts back on the CPAP per insurance regulations

## 2022-12-04 ENCOUNTER — Telehealth: Payer: Self-pay | Admitting: Family Medicine

## 2022-12-04 DIAGNOSIS — H538 Other visual disturbances: Secondary | ICD-10-CM

## 2022-12-04 DIAGNOSIS — H5711 Ocular pain, right eye: Secondary | ICD-10-CM

## 2022-12-04 NOTE — Telephone Encounter (Signed)
Pt stst at last visit you had spoken about getting on Monjaro and his eye results. He wants a second opinion on the eye issue and he wants to get on Monjaro. He would like a call back. Please advise.

## 2022-12-04 NOTE — Telephone Encounter (Signed)
I ordered MRI.  Gabriel Garcia is not preferred on his insurance listed when I opened the chart (listed is Medicaid)-it appears this class of medications is reserved for diabetes from what I can tell for Medicaid with

## 2022-12-04 NOTE — Telephone Encounter (Signed)
Pt states he did go see his eye doctor regarding his eye and the eye doctor said its coming from previous damage to the eye, but pt states there is still something going on with the whites of his eye. Pt states you mentioned him having a MRI of his eye done at his last visit and he would like to proceed with that and he would also like to start monjuero.

## 2022-12-06 NOTE — Telephone Encounter (Signed)
Called and spoke with pt and below message given, pt will reach out to insurance to see what is covered.

## 2022-12-28 ENCOUNTER — Ambulatory Visit
Admission: RE | Admit: 2022-12-28 | Discharge: 2022-12-28 | Disposition: A | Discharge status: Home / Self Care | Payer: Medicaid Other | Source: Ambulatory Visit | Attending: Family Medicine | Admitting: Family Medicine

## 2022-12-28 DIAGNOSIS — H5711 Ocular pain, right eye: Secondary | ICD-10-CM

## 2022-12-28 DIAGNOSIS — H538 Other visual disturbances: Secondary | ICD-10-CM

## 2022-12-28 MED ORDER — GADOPICLENOL 0.5 MMOL/ML IV SOLN
10.0000 mL | Freq: Once | INTRAVENOUS | Status: AC | PRN
  Administered 2022-12-28: 10 mL via INTRAVENOUS

## 2023-01-01 ENCOUNTER — Encounter: Payer: Self-pay | Admitting: Family Medicine

## 2023-02-19 ENCOUNTER — Telehealth (INDEPENDENT_AMBULATORY_CARE_PROVIDER_SITE_OTHER): Payer: Medicaid Other | Admitting: Family Medicine

## 2023-02-19 ENCOUNTER — Encounter: Payer: Self-pay | Admitting: Family Medicine

## 2023-02-19 VITALS — Ht 70.0 in | Wt 260.0 lb

## 2023-02-19 DIAGNOSIS — H5711 Ocular pain, right eye: Secondary | ICD-10-CM | POA: Diagnosis not present

## 2023-02-19 DIAGNOSIS — Z1211 Encounter for screening for malignant neoplasm of colon: Secondary | ICD-10-CM

## 2023-02-19 MED ORDER — LOTILANER 0.25 % OP SOLN: 1.0000 [drp] | Freq: Two times a day (BID) | OPHTHALMIC | 0 refills | Status: DC

## 2023-02-19 NOTE — Patient Instructions (Signed)
Health Maintenance Due  Topic Date Due   COLONOSCOPY (Pts 45-49yrs Insurance coverage will need to be confirmed)  Never done    Recommended follow up: No follow-ups on file. 

## 2023-02-19 NOTE — Progress Notes (Signed)
Phone [phone] Virtual visit via Video note   Subjective:  Chief complaint: Chief Complaint  Patient presents with   eye issues    Pt c/o eye issues getting worse it feels like he has a foreign object in his eye but nothing is there and gets sharp pain from tear duct to outside corner of eye that is causing eye to water and burn and light sensitivity, feels like a warm gel over his eye.   Our team/I connected with Lilyan Gilford at 11:20 AM EDT by a video enabled telemedicine application (caregility through epic) and verified that I am speaking with the correct person using two identifiers. Our team/I discussed the limitations of evaluation and management by telemedicine and the availability of in person appointments.No physical exam was performed (except for noted visual exam or audio findings with Telehealth visits).   Location patient: [place] Location provider: [facility], office Persons participating in the virtual visit:  patient  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[facility]) 11/16/2015    Priority: High   Alcoholism ([facility]) 11/16/2015    Priority: High   Hepatitis C     Priority: High   Hyperglycemia 12/21/2021    Priority: Medium    Hyperlipidemia, unspecified 03/02/2019    Priority: Medium    Genital herpes 09/12/2016    Priority: Medium    OSA (obstructive sleep apnea) 05/28/2016    Priority: Medium    Gout 11/16/2015    Priority: Medium    Acute duodenal ulcer with hemorrhage 10/12/2014    Priority: Medium    Hypertension 10/06/2014    Priority: Medium    Low back pain 03/02/2019    Priority: Low   BPPV (benign paroxysmal positional vertigo) 09/12/2016    Priority: Low   Obese  11/17/2015    Priority: Low   Legally blind in right eye, as defined in [place]     Priority: Low   EKG abnormality 10/12/2014    Priority: Low   Esophageal reflux 11/24/2013    Priority: Low   Atypical chest pain 06/18/2018    Medications- reviewed and updated Current Outpatient Medications  Medication Sig Dispense Refill   allopurinol (ZYLOPRIM) 100 MG tablet Take 1 tablet (100 mg total) by mouth daily. 90 tablet 3   amLODipine (NORVASC) 10 MG tablet Take 1 tablet (10 mg total) by mouth daily. 90 tablet 3   atorvastatin (LIPITOR) 20 MG tablet Take 1 tablet (20 mg total) by mouth daily. 90 tablet 3   losartan-hydrochlorothiazide (HYZAAR) 100-12.5 MG tablet Take 1 tablet by mouth daily. 90 tablet 3   Lotilaner 0.25 % SOLN Apply 1 drop to eye 2 (two) times daily. 6 week treatment quantity sufficient 3 mL 0   pantoprazole (PROTONIX) 40 MG tablet Take 1 tablet (40 mg total) by mouth daily. 90 tablet 3   traZODone (DESYREL) 50 MG tablet Take 2 tablets (100 mg total) by mouth at bedtime as needed for sleep. 180 tablet 1   valACYclovir (VALTREX) 500 MG tablet Take 1 tablet (500 mg total) by mouth 2 (two) times daily. For 3 days with outbreaks 60 tablet 2   No current facility-administered medications for this visit.  Objective:  Ht 5\' 10"  (1.778 m)   Wt 260 lb (117.9 kg)   BMI 37.31 kg/m  self reported vitals Gen: NAD, resting comfortably Right years slightly more closed than the left with mild edema and erythema along lower eyelid Lungs: nonlabored, normal respiratory rate  Skin: appears dry, no obvious rash      Assessment and Plan    # Right eye pain S: Patient with right eye pain dating back to early February at least-had seen emergency department twice, urgent care, [facility]-actually had been seen as far back as August 2023 and given Polytrim eyedrops at urgent care to see if this would help and it did not -History of trauma to the years ago and has large cataract.   Vision is minimal at baseline so hard to tell if any worsening - Last visit reported up to 10 out of 10 pains like someone sticking a needle in his eye for few seconds then gets profuse watering and left side mimics watering and gets pain from the inner portion of his eye as well.  Chronic sensation of sand under the eyelid but nothing has been in the eye.  Denies any headaches.  Some relief with ibuprofen/Aleve type products.  Heat helps some.  We did an MRI of the orbits as well as ESR and CRP - Did urgent ophthalmology referral as well- had seen Dr. Laruth Bouchard office 1.5 years ago and was referred back there- felt like dilated eye exam seemed to be start of this- on follow up there was no clear answer on what the cause was   Bothers him every 2-3 days- randomly starts hurting and watering- stays red and irritated after this and ends his day- has to leave work and get into a dark place- left eye vision is affected A/P: Ongoing intermittent rather intense right eye pain.  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 Laruth Bouchard office  Recommended follow up: Return for as needed for new, worsening, persistent symptoms. Future Appointments  Date Time Provider Department Center  05/23/2023  3:00 PM [name], MD [facility]    Lab/Order associations:   ICD-10-CM   1. Eye pain, right  H57.11 Ambulatory referral to Ophthalmology    2. Screen for colon cancer  Z12.11 Ambulatory referral to Gastroenterology      Meds ordered this encounter  Medications   Lotilaner 0.25 % SOLN    Sig: Apply 1 drop to  eye 2 (two) times daily. 6 week treatment quantity sufficient    Dispense:  3 mL    Refill:  0    Return precautions advised.  Tana Conch, MD

## 2023-02-21 ENCOUNTER — Telehealth: Payer: Self-pay | Admitting: Family Medicine

## 2023-02-21 NOTE — Telephone Encounter (Signed)
[facility] called and states they needs clarification on the RX  Lotilaner 0.25 % SOLN   Please cal them back at [phone]

## 2023-02-24 NOTE — Telephone Encounter (Signed)
Asked for quantity sufficient on form

## 2023-02-24 NOTE — Telephone Encounter (Signed)
Form in your to "review folder".

## 2023-03-18 DIAGNOSIS — H4301 Vitreous prolapse, right eye: Secondary | ICD-10-CM | POA: Insufficient documentation

## 2023-03-18 DIAGNOSIS — H1820 Unspecified corneal edema: Secondary | ICD-10-CM | POA: Insufficient documentation

## 2023-03-18 DIAGNOSIS — H2710 Unspecified dislocation of lens: Secondary | ICD-10-CM | POA: Insufficient documentation

## 2023-04-21 ENCOUNTER — Telehealth: Payer: Self-pay | Admitting: Family Medicine

## 2023-04-21 NOTE — Telephone Encounter (Signed)
Patient requests to be advised if his 6 month follow up scheduled for 05/23/23 be Virtual, otherwise Patient will have to reschedule due to new employment

## 2023-04-21 NOTE — Telephone Encounter (Signed)
See below

## 2023-04-28 NOTE — Telephone Encounter (Signed)
Lvm to inform pt

## 2023-04-28 NOTE — Telephone Encounter (Signed)
 See below

## 2023-05-20 DIAGNOSIS — Z973 Presence of spectacles and contact lenses: Secondary | ICD-10-CM | POA: Insufficient documentation

## 2023-05-23 ENCOUNTER — Ambulatory Visit: Payer: Medicaid Other | Admitting: Family Medicine

## 2023-05-23 ENCOUNTER — Other Ambulatory Visit: Payer: Self-pay | Admitting: Family Medicine

## 2023-05-23 DIAGNOSIS — E790 Hyperuricemia without signs of inflammatory arthritis and tophaceous disease: Secondary | ICD-10-CM

## 2023-06-05 ENCOUNTER — Other Ambulatory Visit: Payer: Self-pay | Admitting: Family Medicine

## 2023-07-24 ENCOUNTER — Other Ambulatory Visit: Payer: Self-pay | Admitting: Nurse Practitioner

## 2023-07-24 ENCOUNTER — Ambulatory Visit: Payer: Self-pay

## 2023-07-24 DIAGNOSIS — M545 Low back pain, unspecified: Secondary | ICD-10-CM

## 2023-07-24 DIAGNOSIS — M542 Cervicalgia: Secondary | ICD-10-CM

## 2023-07-24 DIAGNOSIS — M549 Dorsalgia, unspecified: Secondary | ICD-10-CM

## 2023-08-07 ENCOUNTER — Other Ambulatory Visit: Payer: Self-pay | Admitting: Family Medicine

## 2023-08-07 DIAGNOSIS — A6000 Herpesviral infection of urogenital system, unspecified: Secondary | ICD-10-CM

## 2023-08-13 ENCOUNTER — Other Ambulatory Visit: Payer: Self-pay | Admitting: Family Medicine

## 2023-08-13 DIAGNOSIS — E790 Hyperuricemia without signs of inflammatory arthritis and tophaceous disease: Secondary | ICD-10-CM

## 2023-09-22 ENCOUNTER — Other Ambulatory Visit: Payer: Self-pay | Admitting: Family Medicine

## 2023-09-29 ENCOUNTER — Ambulatory Visit: Payer: Medicaid Other | Admitting: Family Medicine

## 2023-10-01 ENCOUNTER — Emergency Department (HOSPITAL_COMMUNITY): Payer: Medicaid Other

## 2023-10-01 ENCOUNTER — Emergency Department (HOSPITAL_COMMUNITY)
Admission: EM | Admit: 2023-10-01 | Discharge: 2023-10-02 | Disposition: A | Discharge status: Home / Self Care | Payer: Medicaid Other | Attending: Emergency Medicine | Admitting: Emergency Medicine

## 2023-10-01 ENCOUNTER — Other Ambulatory Visit: Payer: Self-pay

## 2023-10-01 DIAGNOSIS — X118XXA Contact with other hot tap-water, initial encounter: Secondary | ICD-10-CM | POA: Diagnosis not present

## 2023-10-01 DIAGNOSIS — T2122XA Burn of second degree of abdominal wall, initial encounter: Secondary | ICD-10-CM | POA: Insufficient documentation

## 2023-10-01 DIAGNOSIS — R002 Palpitations: Secondary | ICD-10-CM | POA: Insufficient documentation

## 2023-10-01 LAB — CBC WITH DIFFERENTIAL/PLATELET
Abs Immature Granulocytes: 0.01 10*3/uL (ref 0.00–0.07)
Basophils Absolute: 0.1 10*3/uL (ref 0.0–0.1)
Basophils Relative: 1 %
Eosinophils Absolute: 0.1 10*3/uL (ref 0.0–0.5)
Eosinophils Relative: 2 %
HCT: 44.6 % (ref 39.0–52.0)
Hemoglobin: 14.6 g/dL (ref 13.0–17.0)
Immature Granulocytes: 0 %
Lymphocytes Relative: 31 %
Lymphs Abs: 1.9 10*3/uL (ref 0.7–4.0)
MCH: 30.8 pg (ref 26.0–34.0)
MCHC: 32.7 g/dL (ref 30.0–36.0)
MCV: 94.1 fL (ref 80.0–100.0)
Monocytes Absolute: 0.3 10*3/uL (ref 0.1–1.0)
Monocytes Relative: 6 %
Neutro Abs: 3.6 10*3/uL (ref 1.7–7.7)
Neutrophils Relative %: 60 %
Platelets: 252 10*3/uL (ref 150–400)
RBC: 4.74 MIL/uL (ref 4.22–5.81)
RDW: 13.2 % (ref 11.5–15.5)
WBC: 6 10*3/uL (ref 4.0–10.5)
nRBC: 0 % (ref 0.0–0.2)

## 2023-10-01 LAB — COMPREHENSIVE METABOLIC PANEL
ALT: 29 U/L (ref 0–44)
AST: 21 U/L (ref 15–41)
Albumin: 4.1 g/dL (ref 3.5–5.0)
Alkaline Phosphatase: 45 U/L (ref 38–126)
Anion gap: 8 (ref 5–15)
BUN: 13 mg/dL (ref 6–20)
CO2: 23 mmol/L (ref 22–32)
Calcium: 9.5 mg/dL (ref 8.9–10.3)
Chloride: 108 mmol/L (ref 98–111)
Creatinine, Ser: 1.26 mg/dL — ABNORMAL HIGH (ref 0.61–1.24)
GFR, Estimated: >60 mL/min (ref >60)
Glucose, Bld: 186 mg/dL — ABNORMAL HIGH (ref 70–99)
Potassium: 4.2 mmol/L (ref 3.5–5.1)
Sodium: 139 mmol/L (ref 135–145)
Total Bilirubin: 0.6 mg/dL (ref <1.2)
Total Protein: 7.2 g/dL (ref 6.5–8.1)

## 2023-10-01 LAB — TROPONIN I (HIGH SENSITIVITY)
Troponin I (High Sensitivity): 2 ng/L (ref <18)
Troponin I (High Sensitivity): <2 ng/L (ref <18)

## 2023-10-01 NOTE — ED Provider Triage Note (Signed)
Emergency Medicine Provider Triage Evaluation Note  Cristen Lebeau , a 47 y.o. male  was evaluated in triage.  Pt complains of SOB x2 weeks, worse today. States woke up and was very short of breath. Reports dyspnea on exertion and palpitations and feeling lightheaded.   Review of Systems  Positive: sob Negative: Chest pain  Physical Exam  BP (!) 126/107 (BP Location: Left Arm)   Pulse 87   Temp 98 F (36.7 C) (Oral)   Resp 14   SpO2 99%  Gen:   Awake, no distress   Resp:  Normal effort  MSK:   Moves extremities without difficulty  Other:  Lungs clear to auscultation  Medical Decision Making  Medically screening exam initiated at 7:02 PM.  Appropriate orders placed.  Mclean Adin Troupe was informed that the remainder of the evaluation will be completed by another provider, this initial triage assessment does not replace that evaluation, and the importance of remaining in the ED until their evaluation is complete.     Pete Pelt, [place] 10/01/23 1904

## 2023-10-01 NOTE — ED Triage Notes (Signed)
Pt reports intermittent SHOB and palpitations throughout the day today. Pt reports the chest pain, SHOB and lightheadedness was waking him up from sleep. Pt reports feeling better now.

## 2023-10-01 NOTE — ED Notes (Signed)
BLUE TOP SENT

## 2023-10-02 MED ORDER — BACITRACIN ZINC 500 UNIT/GM EX OINT
TOPICAL_OINTMENT | Freq: Two times a day (BID) | CUTANEOUS | Status: DC
  Filled 2023-10-02: qty 0.9
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
## 2023-10-09 ENCOUNTER — Other Ambulatory Visit: Payer: Self-pay | Admitting: Family Medicine

## 2023-11-07 ENCOUNTER — Telehealth: Payer: Self-pay | Admitting: Primary Care

## 2023-11-07 NOTE — Telephone Encounter (Signed)
Received compliance report from 03/27/2023 - 04/25/2023 Usage days 30/30 days (100%) greater than 4 hours Average usage 9 hours 29 minutes Pressure 5 to 15 cm H2O (11.9 cm H2O-95%) Air leaks 28.0 L/min (95%) AHI 1.8

## 2023-11-13 ENCOUNTER — Ambulatory Visit: Payer: Self-pay | Admitting: Family Medicine

## 2023-11-13 NOTE — Telephone Encounter (Signed)
Copied from CRM [id number]. Topic: Clinical - Red Word Triage >> Nov 13, 2023 10:49 AM Dennison Nancy wrote: Red Word that prompted transfer to Nurse Triage:  ,blood pressure been running low 112/79 past days been 90/68   heart papilation  Patient did see a heart specialist the other day was diagnosis with a heart condition  He called to schedule a followup with his provider   Chief Complaint: Heart palpitations Symptoms: heart "racing" episodes---not currently and last episode was this morning Frequency: patient states years maybe even a decade and he has been followed by a cardiologist for over a month and a half now Pertinent Negatives: Patient denies chest pain, difficulty breathing, heart racing,  Disposition: [] ED /[] Urgent Care (no appt availability in office) / [] Appointment(In office/virtual)/ []  [facility]/ [] Home Care/ [x] Refused Recommended Disposition /[] [place]/ []  Follow-up with PCP Additional Notes: Patient called and advised that he has been having "fluttering" of his heart.  Patient saw a cardiologist and was put on a cardiac monitor for about 2-3 weeks.  A few nights ago, pt woke up in the middle of the night and took his blood pressure and it was in the 80s systolic.  He states that his blood pressure is normally in a range in the 150s.  He quit taking his blood pressure medication for the past 4 days.  Today it was around 112/78.  He spoke with his cardiologist yesterday and had a visit because of feeling his heart racing and "fluttering".  He had an EKG and went over blood work.  These were all told to him to be normal.  He was told that he had SVT from what he remembers. He was told that he may need an ablation and he was started on Metoprolol. He states that he has had this issue for years.  He states probably a decade. Pt states that he is overweight and gets short of breath easily and he has spoken up with his PCP in the past about this.  Patient states that  he feels like he is not getting anywhere with his current cardiologist. Pt finished wearing the heart monitor last week and the cardiologist read that information to him.  Patient states that he is always tired and wants to talk to his PCP about this.  Patient states that he would like to get in with Dr Durene Cal at the beginning of next week.  He states that his new insurance does not kick in until Feb 1st (this Saturday--two days from now).  Patient is advised that having a fast heart but feeling fine now it is recommended that he is seen within the next 4 hours.  With no availability at his PCP office for the next 4 hours, he is advised to go to an urgent care.  Patient states that he doesn't want to go to an urgent care because this is a chronic issue that he has been dealing with, been to the ER for with no significant diagnosis, and just saw a cardiologist yesterday for.  He is also advised that if he does feel an episode again with his heart racing, to call 911 or get someone to drive him to the emergency room.  Patient verbalized understanding and states that he has been diligent about keeping a record of his blood pressure and heart rate.  He just wants to follow up with his PCP for continuation of care and possibly have a referral to a different cardiologist or  get advice on what his next steps should be.  At this time, patient is not feeling any symptoms of his heart racing, chest pain, difficulty breathing, dizziness.  He states he only has a slight headache, which he took some medicine for.  He is alert and oriented with no complaints of his heart rate at this time.  He is again reminded to go to the emergency room if it happens again, which is verbalized agreement to.  Reason for Disposition  [1] Heart beating very rapidly (e.g., > 140 / minute) AND [2] not present now  (Exception: During exercise.)  Answer Assessment - Initial Assessment Questions 1. DESCRIPTION: "Please describe your heart rate or  heartbeat that you are having" (e.g., fast/slow, regular/irregular, skipped or extra beats, "palpitations")     Palpitations---not on the phone currently 2. ONSET: "When did it start?" (Minutes, hours or days)      December patient went to ER on 10/01/2023 3. DURATION: "How long does it last" (e.g., seconds, minutes, hours)     Recently episodes last maybe 5 minutes but the past 2-3 weeks it feels like it happens all day off and on 4. PATTERN "Does it come and go, or has it been constant since it started?"  "Does it get worse with exertion?"   "Are you feeling it now?"     Comes and goes different times 5. TAP: "Using your hand, can you tap out what you are feeling on a chair or table in front of you, so that I can hear?" (Note: not all patients can do this)       N/a 6. HEART RATE: "Can you tell me your heart rate?" "How many beats in 15 seconds?"  (Note: not all patients can do this)       Not fast right now but was this morning 7. RECURRENT SYMPTOM: "Have you ever had this before?" If Yes, ask: "When was the last time?" and "What happened that time?"      December  went to ER and everything went back to baseline 8. CAUSE: "What do you think is causing the palpitations?"     Unknown for sure...patient was told svt 9. CARDIAC HISTORY: "Do you have any history of heart disease?" (e.g., heart attack, angina, bypass surgery, angioplasty, arrhythmia)      Following up with a cardiologist 10. OTHER SYMPTOMS: "Do you have any other symptoms?" (e.g., dizziness, chest pain, sweating, difficulty breathing)       Right now patient feels fine but sometimes has episodes that wake him up at night, headache only symptom at this time  Protocols used: Heart Rate and Heartbeat Questions-A-AH

## 2023-11-13 NOTE — Telephone Encounter (Signed)
Patient is scheduled for an OV with Dr. Durene Cal on 11/17/23 at 4pm. Called patient to make sure he was aware. Patient informed me that he is aware.

## 2023-11-13 NOTE — Telephone Encounter (Signed)
Please schedule ov with Hunter.

## 2023-11-13 NOTE — Telephone Encounter (Signed)
LVM offering pt OV w/pcp on 11/17/23 @ 4pm.

## 2023-11-17 ENCOUNTER — Encounter: Payer: Self-pay | Admitting: Family Medicine

## 2023-11-17 ENCOUNTER — Ambulatory Visit: Payer: 59 | Admitting: Family Medicine

## 2023-11-17 VITALS — BP 136/90 | HR 71 | Temp 97.0°F | Ht 70.0 in | Wt 287.0 lb

## 2023-11-17 DIAGNOSIS — I2510 Atherosclerotic heart disease of native coronary artery without angina pectoris: Secondary | ICD-10-CM

## 2023-11-17 DIAGNOSIS — F1020 Alcohol dependence, uncomplicated: Secondary | ICD-10-CM

## 2023-11-17 DIAGNOSIS — E785 Hyperlipidemia, unspecified: Secondary | ICD-10-CM

## 2023-11-17 DIAGNOSIS — I10 Essential (primary) hypertension: Secondary | ICD-10-CM

## 2023-11-17 DIAGNOSIS — M10071 Idiopathic gout, right ankle and foot: Secondary | ICD-10-CM | POA: Diagnosis not present

## 2023-11-17 DIAGNOSIS — I7121 Aneurysm of the ascending aorta, without rupture: Secondary | ICD-10-CM

## 2023-11-17 DIAGNOSIS — F1411 Cocaine abuse, in remission: Secondary | ICD-10-CM

## 2023-11-17 NOTE — Patient Instructions (Addendum)
Health Maintenance Due  Topic Date Due   Colonoscopy  Never done  Sent to Dr. Charna Elizabeth Address: [address] [place] [address] Phone: [phone]  Verify metoprolol dosing for me- send to me on mychart. Sounds like you should just be on this and losartan- keep Dr. Sharyn Lull updated on blood pressure- I'm also happy to help but I need records if so  blood pressure mildly elevated today but we need to see how his pressures level out on the losartan 100 mg and metoprolol- he's going to verify dose for me  We will call you within two weeks about your referral for CT angiogram chest through [facility].  Their phone number is [phone].  Please call them if you have not heard in 1-2 weeks  Recommended follow up: Return in about 4 months (around 03/16/2024) for physical or sooner if needed.Schedule b4 you leave.

## 2023-11-17 NOTE — Progress Notes (Signed)
Phone [phone] In person visit   Subjective:   Gabriel Garcia is a 48 y.o. year old very pleasant male patient who presents for/with See problem oriented charting Chief Complaint  Patient presents with   Hypertension    Pt c/o bp being 156/101 a little bit ago. He has been to heart doc due to it running low, has constant headache.   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HCC) 11/16/2015    Priority: High   Alcoholism (HCC) 11/16/2015    Priority: High   Hepatitis C     Priority: High   Hyperglycemia 12/21/2021    Priority: Medium    Hyperlipidemia, unspecified 03/02/2019    Priority: Medium    Genital herpes 09/12/2016    Priority: Medium    OSA (obstructive sleep apnea) 05/28/2016    Priority: Medium    Gout 11/16/2015    Priority: Medium    Acute duodenal ulcer with hemorrhage 10/12/2014    Priority: Medium    Hypertension 10/06/2014    Priority: Medium    Low back pain 03/02/2019    Priority: Low   BPPV (benign paroxysmal positional vertigo) 09/12/2016    Priority: Low   Obese 11/17/2015    Priority: Low   Legally blind in right eye, as defined in [place]     Priority: Low   EKG abnormality 10/12/2014    Priority: Low   Esophageal reflux 11/24/2013    Priority: Low   Atypical chest pain 06/18/2018    Medications- reviewed and updated Current Outpatient Medications  Medication Sig Dispense Refill   atorvastatin (LIPITOR) 20 MG tablet TAKE 1 TABLET BY MOUTH EVERY DAY 90 tablet 3   losartan (COZAAR) 100 MG tablet Take 100 mg by mouth daily.     metoprolol succinate (TOPROL-XL) 25 MG 24 hr tablet Take 25 mg by mouth daily. He is going to verify dose when picks up from Dr. Sharyn Lull  later today     pantoprazole (PROTONIX) 40 MG tablet Take 1 tablet (40 mg total) by mouth daily. 90 tablet 3   traZODone (DESYREL) 50 MG tablet TAKE 2 TABLETS (100 MG TOTAL) BY MOUTH AT BEDTIME AS NEEDED FOR SLEEP. 180 tablet 1   valACYclovir (VALTREX) 500 MG tablet TAKE 1 TABLET (500 MG TOTAL) BY MOUTH 2 (TWO) TIMES DAILY. FOR 3 DAYS WITH OUTBREAKS 180 tablet 2   No current facility-administered medications for this visit.     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  Also recovering alcoholic.  Fortunately reports alcohol and drug free for now 16 months!  Congratulated him on his continued progress   #hypertension S: medication: Amlodipine 10 mg listed but he thinks actually on 5 mg from Dr. Sharyn Lull, losartan hydrochlorothiazide 100-12.5 mg -has also been  prescribed spironolactone  but was told to stop it when he starts metoprolol -reason he went in last Friday was due to blood pressure was 90/70  -the plan was for him to only be on losartan 100 mg and metoprolol that was new for SVT as well as atorvastatin  Home readings #s: Reports up to 156/101 recently before he took spironolactone.  BP Readings from Last 3 Encounters:  11/17/23 (!) 136/90  10/02/23 127/88  11/22/22 110/82   A/P: blood pressure mildly elevated today but we need to see how his pressures level out on the losartan 100 mg and metoprolol- he's going to verify dose for me  # Coronary atherosclerosis-on catheterization with Dr. Sharyn Lull in 2020-proximal RCA to mid RCA 20% stenosed #hyperlipidemia S: Medication:Atorvastatin 20 mg -Has some shortness of breath but feels deconditioning related.  No chest pain reported Lab Results  Component Value Date   CHOL 172 11/22/2022   HDL 51 11/22/2022   LDLCALC 93 11/22/2022   TRIG 183 (H) 11/22/2022   CHOLHDL 3.4 11/22/2022  A/P: Lipids above ideal goal-we will need to check this next visit-lab was not available at the end of our visit unfortunately and he recently had CBC and CMP so preferred to hold off anyway  #palpitations- on heart monitor was found to be SVT- they are in discussions for ablation. Was given metoprolol but hasn't started yet- sent in friday  # GERD S:Medication: Pantoprazole 40 mg daily -Especially needs to be on PPI if on aspirin or Plavix/with Mallory-Weiss tear history and duodenal ulcers-not currently on aspirin or Plavix A/P: GERD reasonable control-continue current medication  #Gout S: Medication: Allopurinol 100 mg in past but he reports only taking as needed A/P:no recent flares in gout- he has modified diet and reports has been doing better     # Hyperglycemia/insulin resistance/prediabetes-peak A1c 6.0-we discussed updating A1c with next labs-he is in agreement  Lab Results  Component Value Date    HGBA1C 5.9 (H) 11/22/2022   HGBA1C 6.0 12/21/2021   HGBA1C 5.6 06/18/2018   Recommended follow up: Return in about 4 months (around 03/16/2024) for physical or sooner if needed.Schedule b4 you leave. Future Appointments  Date Time Provider Department Center  05/07/2024  2:20 PM [name], MD [facility]    Lab/Order associations:   ICD-10-CM   1. Atherosclerosis of native coronary artery of native heart without angina pectoris  I25.10     2. Primary hypertension  I10     3. Idiopathic gout of right foot, unspecified chronicity  M10.071     4. Hyperlipidemia, unspecified hyperlipidemia type  E78.5     5. Aneurysm of ascending aorta without rupture (HCC)  I71.21 CT  ANGIO CHEST AORTA W/CM & OR WO/CM    6. Cocaine abuse in remission (HCC)  F14.11     7. Alcoholism (HCC)  F10.20       No orders of the defined types were placed in this encounter.   Return precautions advised.  Tana Conch, MD

## 2023-11-25 ENCOUNTER — Encounter: Payer: Self-pay | Admitting: Primary Care

## 2023-11-26 ENCOUNTER — Telehealth: Payer: Self-pay

## 2023-11-26 DIAGNOSIS — Z1211 Encounter for screening for malignant neoplasm of colon: Secondary | ICD-10-CM

## 2023-11-26 NOTE — Telephone Encounter (Signed)
Copied from CRM [id number]. Topic: General - Other >> Nov 26, 2023  2:43 PM Kathryne Eriksson wrote: Reason for CRM: General Information >> Nov 26, 2023  2:58 PM Kathryne Eriksson wrote: Patient states he called to try and schedule his appointment with Dr. Loreta Ave for his colonoscopy, but was informed they don't accept his insurance [facility]), patient is wanting to know if he could be referred some place else. Patient also wanted to let provider know that he's taking metoprolol succinate (TORPOL-XL) 25 MG 24 hr tablet  >> Nov 26, 2023  2:43 PM Kathryne Eriksson wrote:   Message has been sent to provider to address

## 2023-11-27 NOTE — Telephone Encounter (Signed)
Yes you may refer to [facility]-I believe for colon cancer screening but can double check last referral or check with patient

## 2023-12-01 NOTE — Telephone Encounter (Signed)
 Referral has been placed.

## 2023-12-01 NOTE — Addendum Note (Signed)
Addended by: Gwenette Greet on: 12/01/2023 02:56 PM   Modules accepted: Orders

## 2023-12-02 ENCOUNTER — Other Ambulatory Visit: Payer: Self-pay | Admitting: Family Medicine

## 2023-12-12 ENCOUNTER — Other Ambulatory Visit: Payer: Self-pay | Admitting: Family Medicine

## 2023-12-23 ENCOUNTER — Encounter: Payer: Self-pay | Admitting: Family Medicine

## 2023-12-23 ENCOUNTER — Ambulatory Visit
Admission: RE | Admit: 2023-12-23 | Discharge: 2023-12-23 | Disposition: A | Discharge status: Home / Self Care | Payer: 59 | Source: Ambulatory Visit | Attending: Family Medicine | Admitting: Family Medicine

## 2023-12-23 DIAGNOSIS — I7121 Aneurysm of the ascending aorta, without rupture: Secondary | ICD-10-CM

## 2023-12-23 MED ORDER — IOPAMIDOL (ISOVUE-370) INJECTION 76%
75.0000 mL | Freq: Once | INTRAVENOUS | Status: AC | PRN
  Administered 2023-12-23: 75 mL via INTRAVENOUS

## 2024-01-17 ENCOUNTER — Other Ambulatory Visit: Payer: Self-pay | Admitting: Family Medicine

## 2024-05-07 ENCOUNTER — Ambulatory Visit (INDEPENDENT_AMBULATORY_CARE_PROVIDER_SITE_OTHER): Payer: 59 | Admitting: Family Medicine

## 2024-05-07 ENCOUNTER — Encounter: Payer: Self-pay | Admitting: Family Medicine

## 2024-05-07 VITALS — BP 112/84 | HR 68 | Temp 98.1°F | Ht 70.0 in | Wt 296.2 lb

## 2024-05-07 DIAGNOSIS — Z0000 Encounter for general adult medical examination without abnormal findings: Secondary | ICD-10-CM | POA: Diagnosis not present

## 2024-05-07 DIAGNOSIS — R739 Hyperglycemia, unspecified: Secondary | ICD-10-CM | POA: Diagnosis not present

## 2024-05-07 DIAGNOSIS — I10 Essential (primary) hypertension: Secondary | ICD-10-CM

## 2024-05-07 DIAGNOSIS — M10071 Idiopathic gout, right ankle and foot: Secondary | ICD-10-CM

## 2024-05-07 DIAGNOSIS — E785 Hyperlipidemia, unspecified: Secondary | ICD-10-CM | POA: Diagnosis not present

## 2024-05-07 DIAGNOSIS — Z23 Encounter for immunization: Secondary | ICD-10-CM | POA: Diagnosis not present

## 2024-05-07 DIAGNOSIS — Z131 Encounter for screening for diabetes mellitus: Secondary | ICD-10-CM

## 2024-05-07 DIAGNOSIS — Z1211 Encounter for screening for malignant neoplasm of colon: Secondary | ICD-10-CM

## 2024-05-07 MED ORDER — TRAZODONE HCL 50 MG PO TABS: 100.0000 mg | ORAL_TABLET | Freq: Every evening | ORAL | 1 refills | Status: DC | PRN

## 2024-05-07 NOTE — Patient Instructions (Addendum)
 Tetanus, Diphtheria, and Pertussis (Tdap) today  Please stop by lab before you go If you have mychart- we will send your results within 3 business days of us  receiving them.  If you do not have mychart- we will call you about results within 5 business days of us  receiving them.  *please also note that you will see labs on mychart as soon as they post. I will later go in and write notes on them- will say notes from Dr. Katrinka   please let us  know if you have not received your Cologuard within 3 weeks-please complete after you receive  Recommended follow up: Return in about 6 months (around 11/07/2024) for followup or sooner if needed.Schedule b4 you leave.

## 2024-05-07 NOTE — Addendum Note (Signed)
 Addended by: Sheyli Horwitz on: 05/07/2024 03:33 PM   Modules accepted: Orders
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
## 2024-05-08 LAB — COMPREHENSIVE METABOLIC PANEL WITH GFR
AG Ratio: 1.7 (calc) (ref 1.0–2.5)
ALT: 23 U/L (ref 9–46)
AST: 13 U/L (ref 10–40)
Albumin: 4.4 g/dL (ref 3.6–5.1)
Alkaline phosphatase (APISO): 48 U/L (ref 36–130)
BUN: 16 mg/dL (ref 7–25)
CO2: 25 mmol/L (ref 20–32)
Calcium: 9.7 mg/dL (ref 8.6–10.3)
Chloride: 106 mmol/L (ref 98–110)
Creat: 1.05 mg/dL (ref 0.60–1.29)
Globulin: 2.6 g/dL (ref 1.9–3.7)
Glucose, Bld: 102 mg/dL — ABNORMAL HIGH (ref 65–99)
Potassium: 4.3 mmol/L (ref 3.5–5.3)
Sodium: 141 mmol/L (ref 135–146)
Total Bilirubin: 0.4 mg/dL (ref 0.2–1.2)
Total Protein: 7 g/dL (ref 6.1–8.1)
eGFR: 88 mL/min/1.73m2

## 2024-05-08 LAB — LIPID PANEL
Cholesterol: 170 mg/dL (ref <200)
HDL: 50 mg/dL (ref >=40)
LDL Cholesterol (Calc): 95 mg/dL
Non-HDL Cholesterol (Calc): 120 mg/dL (ref <130)
Total CHOL/HDL Ratio: 3.4 (calc) (ref <5.0)
Triglycerides: 149 mg/dL (ref <150)

## 2024-05-08 LAB — HEMOGLOBIN A1C
Hgb A1c MFr Bld: 6.3 % — ABNORMAL HIGH (ref <5.7)
Mean Plasma Glucose: 134 mg/dL
eAG (mmol/L): 7.4 mmol/L

## 2024-05-08 LAB — CBC WITH DIFFERENTIAL/PLATELET
Absolute Lymphocytes: 2298 {cells}/uL (ref 850–3900)
Absolute Monocytes: 422 {cells}/uL (ref 200–950)
Basophils Absolute: 51 {cells}/uL (ref 0–200)
Basophils Relative: 0.8 %
Eosinophils Absolute: 109 {cells}/uL (ref 15–500)
Eosinophils Relative: 1.7 %
HCT: 42.8 % (ref 38.5–50.0)
Hemoglobin: 14.1 g/dL (ref 13.2–17.1)
MCH: 30.9 pg (ref 27.0–33.0)
MCHC: 32.9 g/dL (ref 32.0–36.0)
MCV: 93.7 fL (ref 80.0–100.0)
MPV: 10.4 fL (ref 7.5–12.5)
Monocytes Relative: 6.6 %
Neutro Abs: 3520 {cells}/uL (ref 1500–7800)
Neutrophils Relative %: 55 %
Platelets: 255 Thousand/uL (ref 140–400)
RBC: 4.57 Million/uL (ref 4.20–5.80)
RDW: 13.6 % (ref 11.0–15.0)
Total Lymphocyte: 35.9 %
WBC: 6.4 Thousand/uL (ref 3.8–10.8)

## 2024-05-08 LAB — URIC ACID: Uric Acid, Serum: 7.8 mg/dL (ref 4.0–8.0)

## 2024-05-12 ENCOUNTER — Ambulatory Visit: Payer: Self-pay | Admitting: Family Medicine

## 2024-05-14 ENCOUNTER — Other Ambulatory Visit: Payer: Self-pay | Admitting: *Deleted

## 2024-05-14 ENCOUNTER — Telehealth: Payer: Self-pay | Admitting: *Deleted

## 2024-05-14 DIAGNOSIS — Z1211 Encounter for screening for malignant neoplasm of colon: Secondary | ICD-10-CM

## 2024-05-14 NOTE — Telephone Encounter (Signed)
 Copied from CRM [id number]. Topic: General - Other >> May 13, 2024 12:44 PM Deaijah H wrote: Reason for CRM: Patient called in to advise Dr. Carolee nurse know that he does take atorvastatin  (LIPITOR) 20 MG tablet daily & stated Cologuard is showing delivered status but he has not received it. Test sent to old address, need it sent to  [address] [place] [address]    New cologuard order send  [facility]

## 2024-06-03 LAB — COLOGUARD: COLOGUARD: NEGATIVE

## 2024-06-10 ENCOUNTER — Other Ambulatory Visit: Payer: Self-pay

## 2024-06-10 MED ORDER — ATORVASTATIN CALCIUM 20 MG PO TABS: 20.0000 mg | ORAL_TABLET | Freq: Every day | ORAL | 3 refills | Status: DC

## 2024-09-29 ENCOUNTER — Encounter: Payer: Self-pay | Admitting: Family Medicine

## 2024-10-31 ENCOUNTER — Other Ambulatory Visit: Payer: Self-pay | Admitting: Family Medicine

## 2024-10-31 DIAGNOSIS — E790 Hyperuricemia without signs of inflammatory arthritis and tophaceous disease: Secondary | ICD-10-CM

## 2024-11-12 ENCOUNTER — Ambulatory Visit: Admitting: Family Medicine

## 2024-11-12 ENCOUNTER — Encounter: Payer: Self-pay | Admitting: Family Medicine

## 2024-11-12 VITALS — BP 124/86 | HR 65 | Temp 97.3°F | Ht 70.0 in | Wt 293.2 lb

## 2024-11-12 DIAGNOSIS — G4733 Obstructive sleep apnea (adult) (pediatric): Secondary | ICD-10-CM

## 2024-11-12 DIAGNOSIS — Z131 Encounter for screening for diabetes mellitus: Secondary | ICD-10-CM

## 2024-11-12 DIAGNOSIS — F1411 Cocaine abuse, in remission: Secondary | ICD-10-CM

## 2024-11-12 DIAGNOSIS — F1021 Alcohol dependence, in remission: Secondary | ICD-10-CM | POA: Diagnosis not present

## 2024-11-12 DIAGNOSIS — A6000 Herpesviral infection of urogenital system, unspecified: Secondary | ICD-10-CM

## 2024-11-12 DIAGNOSIS — R739 Hyperglycemia, unspecified: Secondary | ICD-10-CM

## 2024-11-12 DIAGNOSIS — E785 Hyperlipidemia, unspecified: Secondary | ICD-10-CM

## 2024-11-12 MED ORDER — TRAZODONE HCL 50 MG PO TABS: 100.0000 mg | ORAL_TABLET | Freq: Every evening | ORAL | 1 refills | Status: AC | PRN

## 2024-11-12 MED ORDER — ZEPBOUND 2.5 MG/0.5ML ~~LOC~~ SOAJ: 2.5000 mg | SUBCUTANEOUS | 5 refills | Status: AC

## 2024-11-12 MED ORDER — VALACYCLOVIR HCL 500 MG PO TABS: 500.0000 mg | ORAL_TABLET | Freq: Two times a day (BID) | ORAL | 2 refills | Status: AC

## 2024-11-12 MED ORDER — ZEPBOUND 2.5 MG/0.5ML ~~LOC~~ SOAJ: 2.5000 mg | SUBCUTANEOUS | 5 refills | Status: DC

## 2024-11-12 MED ORDER — ATORVASTATIN CALCIUM 40 MG PO TABS: 40.0000 mg | ORAL_TABLET | Freq: Every day | ORAL | 3 refills | Status: AC

## 2024-11-12 NOTE — Addendum Note (Signed)
 Addended by: KATRINKA GARNETTE KIDD on: 11/12/2024 03:53 PM   Modules accepted: Orders
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
## 2024-11-13 LAB — COMPREHENSIVE METABOLIC PANEL WITH GFR
AG Ratio: 1.8 (calc) (ref 1.0–2.5)
ALT: 23 U/L (ref 9–46)
AST: 19 U/L (ref 10–40)
Albumin: 4.6 g/dL (ref 3.6–5.1)
Alkaline phosphatase (APISO): 45 U/L (ref 36–130)
BUN: 15 mg/dL (ref 7–25)
CO2: 29 mmol/L (ref 20–32)
Calcium: 9.4 mg/dL (ref 8.6–10.3)
Chloride: 104 mmol/L (ref 98–110)
Creat: 1.09 mg/dL (ref 0.60–1.29)
Globulin: 2.6 g/dL (ref 1.9–3.7)
Glucose, Bld: 101 mg/dL — ABNORMAL HIGH (ref 65–99)
Potassium: 4 mmol/L (ref 3.5–5.3)
Sodium: 140 mmol/L (ref 135–146)
Total Bilirubin: 0.4 mg/dL (ref 0.2–1.2)
Total Protein: 7.2 g/dL (ref 6.1–8.1)
eGFR: 84 mL/min/{1.73_m2}

## 2024-11-13 LAB — HEMOGLOBIN A1C
Hgb A1c MFr Bld: 6 % — ABNORMAL HIGH
Mean Plasma Glucose: 126 mg/dL
eAG (mmol/L): 7 mmol/L

## 2024-11-15 ENCOUNTER — Ambulatory Visit: Payer: Self-pay | Admitting: Family Medicine

## 2024-11-18 ENCOUNTER — Telehealth: Payer: Self-pay | Admitting: Family Medicine

## 2024-11-18 NOTE — Telephone Encounter (Signed)
 Copied from CRM #[id number]. Topic: General - Other >> Nov 18, 2024  3:22 PM Deleta RAMAN wrote: Reason for CRM: patient calling regarding prior authorization update

## 2024-11-19 NOTE — Telephone Encounter (Signed)
 Hello,   Do we have a prior authorization in process with this patient?   Thank you,  Tawni DEL., CMA

## 2024-11-19 NOTE — Telephone Encounter (Signed)
 CMA stated it is for Zepbound 

## 2025-05-13 ENCOUNTER — Encounter: Admitting: Family Medicine
